# Patient Record
Sex: Male | Born: 1960 | Race: Black or African American | Hispanic: No | Marital: Married | Smoking: Former smoker
Health system: Southern US, Community
[De-identification: ages and names within clinical notes are randomized; demographics above are authoritative.]

## PROBLEM LIST (undated history)

## (undated) DIAGNOSIS — E119 Type 2 diabetes mellitus without complications: Secondary | ICD-10-CM

## (undated) DIAGNOSIS — I10 Essential (primary) hypertension: Secondary | ICD-10-CM

## (undated) DIAGNOSIS — T7840XA Allergy, unspecified, initial encounter: Secondary | ICD-10-CM

## (undated) DIAGNOSIS — E785 Hyperlipidemia, unspecified: Secondary | ICD-10-CM

## (undated) DIAGNOSIS — M1990 Unspecified osteoarthritis, unspecified site: Secondary | ICD-10-CM

## (undated) DIAGNOSIS — G4730 Sleep apnea, unspecified: Secondary | ICD-10-CM

## (undated) HISTORY — DX: Type 2 diabetes mellitus without complications: E11.9

## (undated) HISTORY — DX: Hyperlipidemia, unspecified: E78.5

## (undated) HISTORY — DX: Essential (primary) hypertension: I10

## (undated) HISTORY — DX: Unspecified osteoarthritis, unspecified site: M19.90

## (undated) HISTORY — DX: Allergy, unspecified, initial encounter: T78.40XA

## (undated) HISTORY — PX: NO PAST SURGERIES: SHX2092

## (undated) HISTORY — DX: Sleep apnea, unspecified: G47.30

---

## 1998-05-03 ENCOUNTER — Encounter: Admission: RE | Admit: 1998-05-03 | Discharge: 1998-08-01 | Payer: Self-pay | Admitting: *Deleted

## 2007-06-26 ENCOUNTER — Encounter: Admission: RE | Admit: 2007-06-26 | Discharge: 2007-06-26 | Payer: Self-pay | Admitting: Family Medicine

## 2012-02-20 ENCOUNTER — Encounter (HOSPITAL_BASED_OUTPATIENT_CLINIC_OR_DEPARTMENT_OTHER): Payer: Self-pay

## 2013-02-07 ENCOUNTER — Other Ambulatory Visit: Payer: Self-pay | Admitting: Family Medicine

## 2013-02-07 ENCOUNTER — Encounter: Payer: Self-pay | Admitting: Internal Medicine

## 2013-02-07 DIAGNOSIS — E785 Hyperlipidemia, unspecified: Secondary | ICD-10-CM

## 2013-02-11 ENCOUNTER — Other Ambulatory Visit: Payer: Self-pay

## 2013-02-14 ENCOUNTER — Other Ambulatory Visit: Payer: Self-pay

## 2013-02-20 ENCOUNTER — Other Ambulatory Visit: Payer: Self-pay

## 2013-03-20 ENCOUNTER — Encounter: Payer: Self-pay | Admitting: Internal Medicine

## 2013-04-22 ENCOUNTER — Ambulatory Visit: Payer: Self-pay | Admitting: Family Medicine

## 2013-04-23 ENCOUNTER — Ambulatory Visit (INDEPENDENT_AMBULATORY_CARE_PROVIDER_SITE_OTHER): Payer: BC Managed Care – PPO | Admitting: Physician Assistant

## 2013-04-23 ENCOUNTER — Encounter: Payer: Self-pay | Admitting: Physician Assistant

## 2013-04-23 VITALS — BP 158/93 | HR 76 | Temp 98.2°F | Ht 71.0 in

## 2013-04-23 DIAGNOSIS — E1169 Type 2 diabetes mellitus with other specified complication: Secondary | ICD-10-CM | POA: Insufficient documentation

## 2013-04-23 DIAGNOSIS — I152 Hypertension secondary to endocrine disorders: Secondary | ICD-10-CM | POA: Insufficient documentation

## 2013-04-23 DIAGNOSIS — E139 Other specified diabetes mellitus without complications: Secondary | ICD-10-CM

## 2013-04-23 DIAGNOSIS — I10 Essential (primary) hypertension: Secondary | ICD-10-CM | POA: Insufficient documentation

## 2013-04-23 DIAGNOSIS — E669 Obesity, unspecified: Secondary | ICD-10-CM

## 2013-04-23 DIAGNOSIS — E119 Type 2 diabetes mellitus without complications: Secondary | ICD-10-CM

## 2013-04-23 DIAGNOSIS — E785 Hyperlipidemia, unspecified: Secondary | ICD-10-CM

## 2013-04-23 DIAGNOSIS — E1159 Type 2 diabetes mellitus with other circulatory complications: Secondary | ICD-10-CM | POA: Insufficient documentation

## 2013-04-23 DIAGNOSIS — E782 Mixed hyperlipidemia: Secondary | ICD-10-CM | POA: Insufficient documentation

## 2013-04-23 MED ORDER — AMLODIPINE BESY-BENAZEPRIL HCL 10-40 MG PO CAPS: 1.0000 | ORAL_CAPSULE | Freq: Every day | ORAL | Status: DC

## 2013-04-23 NOTE — Progress Notes (Signed)
Subjective:     Patient ID: Jason Meyer, male   DOB: [birth year], 52 y.o.   MRN: [id number]  Cough This is a new problem. The current episode started in the past 7 days. The problem has been unchanged. The problem occurs constantly. The cough is non-productive. Associated symptoms include heartburn and postnasal drip. The symptoms are aggravated by lying down. He has tried nothing for the symptoms. His past medical history is significant for environmental allergies.     Review of Systems  HENT: Positive for postnasal drip.   Respiratory: Positive for cough.   Gastrointestinal: Positive for heartburn.  Allergic/Immunologic: Positive for environmental allergies.  All other systems reviewed and are negative.       Objective:   Physical Exam  HENT:  Mouth/Throat: Uvula is midline, oropharynx is clear and moist and mucous membranes are normal.  Neck: No JVD present. Carotid bruit is not present.  Cardiovascular: Normal rate, regular rhythm, S1 normal, S2 normal, normal heart sounds and normal pulses.   Pulmonary/Chest: Effort normal and breath sounds normal.       Assessment:     Cough- question allergic rhinitis     Plan:     OTC antihistamines Fluids Rest INB- nasal steroid

## 2013-04-23 NOTE — Patient Instructions (Addendum)
Cough, Adult  A cough is a reflex that helps clear your throat and airways. It can help heal the body or may be a reaction to an irritated airway. A cough may only last 2 or 3 weeks (acute) or may last more than 8 weeks (chronic).  CAUSES Acute cough:  Viral or bacterial infections. Chronic cough:  Infections.  Allergies.  Asthma.  Post-nasal drip.  Smoking.  Heartburn or acid reflux.  Some medicines.  Chronic lung problems (COPD).  Cancer. SYMPTOMS   Cough.  Fever.  Chest pain.  Increased breathing rate.  High-pitched whistling sound when breathing (wheezing).  Colored mucus that you cough up (sputum). TREATMENT   A bacterial cough may be treated with antibiotic medicine.  A viral cough must run its course and will not respond to antibiotics.  Your caregiver may recommend other treatments if you have a chronic cough. HOME CARE INSTRUCTIONS   Only take over-the-counter or prescription medicines for pain, discomfort, or fever as directed by your caregiver. Use cough suppressants only as directed by your caregiver.  Use a cold steam vaporizer or humidifier in your bedroom or home to help loosen secretions.  Sleep in a semi-upright position if your cough is worse at night.  Rest as needed.  Stop smoking if you smoke. SEEK IMMEDIATE MEDICAL CARE IF:   You have pus in your sputum.  Your cough starts to worsen.  You cannot control your cough with suppressants and are losing sleep.  You begin coughing up blood.  You have difficulty breathing.  You develop pain which is getting worse or is uncontrolled with medicine.  You have a fever. MAKE SURE YOU:   Understand these instructions.  Will watch your condition.  Will get help right away if you are not doing well or get worse. Document Released: 05/26/2011 Document Revised: 02/19/2012 Document Reviewed: 05/26/2011 [facility] Patient Information 2013 [place], [place].   Use Claritin (loratadine)  10mg  OTC.

## 2013-04-29 ENCOUNTER — Ambulatory Visit: Payer: BC Managed Care – PPO | Admitting: General Practice

## 2013-08-05 ENCOUNTER — Ambulatory Visit (INDEPENDENT_AMBULATORY_CARE_PROVIDER_SITE_OTHER): Payer: BC Managed Care – PPO | Admitting: Family Medicine

## 2013-08-05 ENCOUNTER — Encounter: Payer: Self-pay | Admitting: Family Medicine

## 2013-08-05 VITALS — BP 148/79 | HR 72 | Temp 98.0°F | Ht 71.0 in

## 2013-08-05 DIAGNOSIS — S39011A Strain of muscle, fascia and tendon of abdomen, initial encounter: Secondary | ICD-10-CM

## 2013-08-05 DIAGNOSIS — IMO0002 Reserved for concepts with insufficient information to code with codable children: Secondary | ICD-10-CM

## 2013-08-05 DIAGNOSIS — E119 Type 2 diabetes mellitus without complications: Secondary | ICD-10-CM

## 2013-08-05 MED ORDER — CYCLOBENZAPRINE HCL 10 MG PO TABS: 10.0000 mg | ORAL_TABLET | Freq: Three times a day (TID) | ORAL | Status: DC | PRN

## 2013-08-05 MED ORDER — MELOXICAM 15 MG PO TABS: 15.0000 mg | ORAL_TABLET | Freq: Every day | ORAL | Status: DC

## 2013-08-05 NOTE — Addendum Note (Signed)
Addended by: Floydene Flock on: 08/05/2013 10:28 AM   Modules accepted: Orders

## 2013-08-05 NOTE — Progress Notes (Addendum)
  Subjective:    Patient ID: Jason Meyer, male    DOB: May 13, 1961, 52 y.o.   MRN: [id number]  HPI Patient presents today with chief complaint of R lateral abdominal pain  Initially noticed pain 4-5 days ago.  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  Pain worse with movement.  No dysuria, diarrhea, nausea No rash.  No burning.  Has been taking IBF with mild improvement in sxs.  Baseline morbid obesity, DM, HTN.  Pt states DM well controlled, but unsure of blood sugars.     Review of Systems  All other systems reviewed and are negative.       Objective:   Physical Exam  Constitutional:  Morbidly obese    HENT:  Head: Normocephalic and atraumatic.  Eyes: Conjunctivae are normal. Pupils are equal, round, and reactive to light.  Neck: Normal range of motion.  Cardiovascular: Normal rate and regular rhythm.   Pulmonary/Chest: Effort normal and breath sounds normal.  Abdominal:    Obese abdomen, + bowel sounds No flank pain  + mild TTP along affected area + pain over affected area with lateral abdominal movement.     Musculoskeletal: Normal range of motion.  Neurological: He is alert.  Skin: Skin is warm.          Assessment & Plan:  Abdominal muscle strain, initial encounter - Plan: meloxicam (MOBIC) 15 MG tablet, cyclobenzaprine (FLEXERIL) 10 MG tablet  DM (diabetes mellitus) - Plan: POCT glycosylated hemoglobin (Hb A1C)  Likely lateral abdominal strain. Rice and NSAIDs. Flexeril.  Discuss imaging in other forms a workup that may be necessary if symptoms fail to improve. Patient currently in a rush and would like to leave for a dentist appointment. Discussed with patient if symptoms persist we will need to do other studies like a KUB or urinalysis to rule out other causes of sxs like a kidney stone. Patient expressed understanding of this. We'll also check A1c to assess for diabetic status.  Also discussed making  dentist aware of patient being on a muscle relaxer to the dentist indicates that he is prescribed narcotics by the dentist to avoid oversedation. Patient expressed understanding.

## 2013-10-21 ENCOUNTER — Encounter: Payer: Self-pay | Admitting: Family Medicine

## 2013-10-21 ENCOUNTER — Ambulatory Visit (INDEPENDENT_AMBULATORY_CARE_PROVIDER_SITE_OTHER): Payer: BC Managed Care – PPO | Admitting: Family Medicine

## 2013-10-21 ENCOUNTER — Encounter (INDEPENDENT_AMBULATORY_CARE_PROVIDER_SITE_OTHER): Payer: Self-pay

## 2013-10-21 VITALS — BP 175/97 | HR 67 | Temp 98.2°F | Ht 70.0 in | Wt >= 6400 oz

## 2013-10-21 DIAGNOSIS — I10 Essential (primary) hypertension: Secondary | ICD-10-CM

## 2013-10-21 DIAGNOSIS — E119 Type 2 diabetes mellitus without complications: Secondary | ICD-10-CM

## 2013-10-21 DIAGNOSIS — E785 Hyperlipidemia, unspecified: Secondary | ICD-10-CM

## 2013-10-21 DIAGNOSIS — N529 Male erectile dysfunction, unspecified: Secondary | ICD-10-CM

## 2013-10-21 LAB — POCT CBC
Granulocyte percent: 70 %G (ref 37–80)
HCT, POC: 48.5 % (ref 43.5–53.7)
Hemoglobin: 15.2 g/dL (ref 14.1–18.1)
Lymph, poc: 2.2 (ref 0.6–3.4)
MCH, POC: 25.7 pg — AB (ref 27–31.2)
MCHC: 31.4 g/dL — AB (ref 31.8–35.4)
MCV: 82 fL (ref 80–97)
MPV: 8.2 fL (ref 0–99.8)
POC Granulocyte: 6.6 (ref 2–6.9)
POC LYMPH PERCENT: 23.8 %L (ref 10–50)
Platelet Count, POC: 252 10*3/uL (ref 142–424)
RBC: 5.9 M/uL (ref 4.69–6.13)
RDW, POC: 14.2 %
WBC: 9.4 10*3/uL (ref 4.6–10.2)

## 2013-10-21 LAB — POCT GLYCOSYLATED HEMOGLOBIN (HGB A1C): Hemoglobin A1C: 6.7

## 2013-10-21 MED ORDER — SILDENAFIL CITRATE 100 MG PO TABS: 50.0000 mg | ORAL_TABLET | Freq: Every day | ORAL | Status: DC | PRN

## 2013-10-21 MED ORDER — ATORVASTATIN CALCIUM 40 MG PO TABS: 40.0000 mg | ORAL_TABLET | Freq: Every day | ORAL | Status: DC

## 2013-10-21 MED ORDER — HYDROCHLOROTHIAZIDE 25 MG PO TABS: 25.0000 mg | ORAL_TABLET | Freq: Every day | ORAL | Status: DC

## 2013-10-21 MED ORDER — METOPROLOL TARTRATE 100 MG PO TABS: 100.0000 mg | ORAL_TABLET | Freq: Two times a day (BID) | ORAL | Status: DC

## 2013-10-21 MED ORDER — METFORMIN HCL 500 MG PO TABS: 500.0000 mg | ORAL_TABLET | Freq: Two times a day (BID) | ORAL | Status: DC

## 2013-10-21 MED ORDER — AMLODIPINE BESY-BENAZEPRIL HCL 10-40 MG PO CAPS: 1.0000 | ORAL_CAPSULE | Freq: Every day | ORAL | Status: DC

## 2013-10-21 NOTE — Progress Notes (Signed)
  Subjective:    Patient ID: Jason Meyer, male    DOB: [birth year], 52 y.o.   MRN: [id number]  HPI Follow up htn,diabetes and needs refills. Been out of meds since  Friday.            Patient Active Problem List   Diagnosis Date Noted  . 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 HTN (hypertension) 04/23/2013  . Obesity, unspecified 04/23/2013  . Other and unspecified hyperlipidemia 04/23/2013  Current outpatient prescriptions:amLODipine-benazepril (LOTREL) 10-40 MG per capsule, Take 1 capsule by mouth daily., Disp: 60 capsule, Rfl: 1;  atorvastatin (LIPITOR) 40 MG tablet, Take 40 mg by mouth daily., Disp: , Rfl: ;  cimetidine (TAGAMET) 200 MG tablet, Take 200 mg by mouth 4 (four) times daily., Disp: , Rfl:  cyclobenzaprine (FLEXERIL) 10 MG tablet, Take 1 tablet (10 mg total) by mouth 3 (three) times daily as needed for muscle spasms., Disp: 30 tablet, Rfl: 0;  hydrochlorothiazide (HYDRODIURIL) 25 MG tablet, Take 25 mg by mouth daily., Disp: , Rfl: ;  HYDROcodone-acetaminophen (NORCO) 7.5-325 MG per tablet, , Disp: , Rfl: ;  meloxicam (MOBIC) 15 MG tablet, Take 1 tablet (15 mg total) by mouth daily., Disp: 30 tablet, Rfl: 1 metFORMIN (GLUCOPHAGE) 500 MG tablet, Take 500 mg by mouth 2 (two) times daily with a meal., Disp: , Rfl: ;  metoprolol (LOPRESSOR) 100 MG tablet, Take 100 mg by mouth 2 (two) times daily., Disp: , Rfl:     Review of Systems BP 175/97  Pulse 67  Temp(Src) 98.2 F (36.8 C) (Oral)  Ht 5\' 10"  (1.778 m)  Wt 439 lb (199.129 kg)  BMI 62.99 kg/m2     Objective:   Physical Exam        Assessment & Plan:

## 2013-10-21 NOTE — Progress Notes (Signed)
  Subjective:    Patient ID: Jason Meyer, male    DOB: 01/15/1961, 52 y.o.   MRN: [id number]  HPI This 52 y.o. male presents for evaluation of diabetes, hypertension, and hyperlipidemia Follow up.  He is experiencing some ED sx's.   Review of Systems C/o ED No chest pain, SOB, HA, dizziness, vision change, N/V, diarrhea, constipation, dysuria, urinary urgency or frequency, myalgias, arthralgias or rash.     Objective:   Physical Exam Vital signs noted  Well developed well nourished male.  HEENT - Head atraumatic Normocephalic                Eyes - PERRLA, Conjuctiva - clear Sclera- Clear EOMI                Ears - EAC's Wnl TM's Wnl Gross Hearing WNL                Nose - Nares patent                 Throat - oropharanx wnl Respiratory - Lungs CTA bilateral Cardiac - RRR S1 and S2 without murmur GI - Abdomen soft Nontender and bowel sounds active x 4 Extremities - No edema. Neuro - Grossly intact.       Assessment & Plan:  Diabetes - Plan: metFORMIN (GLUCOPHAGE) 500 MG tablet, POCT CBC, CMP14+EGFR, POCT glycosylated hemoglobin (Hb A1C)  Essential hypertension, benign - Plan: amLODipine-benazepril (LOTREL) 10-40 MG per capsule, hydrochlorothiazide (HYDRODIURIL) 25 MG tablet, metoprolol (LOPRESSOR) 100 MG tablet, POCT CBC, CMP14+EGFR, POCT glycosylated hemoglobin (Hb A1C)  Hyperlipidemia - Plan: atorvastatin (LIPITOR) 40 MG tablet  Erectile dysfunction - Plan: sildenafil (VIAGRA) 100 MG tablet  Deatra Canter FNP

## 2013-10-21 NOTE — Patient Instructions (Signed)

## 2013-10-22 LAB — CMP14+EGFR
ALT: 36 IU/L (ref 0–44)
AST: 22 IU/L (ref 0–40)
Albumin/Globulin Ratio: 1.4 (ref 1.1–2.5)
Albumin: 3.9 g/dL (ref 3.5–5.5)
Alkaline Phosphatase: 102 IU/L (ref 39–117)
BUN/Creatinine Ratio: 12 (ref 9–20)
BUN: 12 mg/dL (ref 6–24)
CO2: 27 mmol/L (ref 18–29)
Calcium: 9.5 mg/dL (ref 8.7–10.2)
Chloride: 98 mmol/L (ref 97–108)
Creatinine, Ser: 1.02 mg/dL (ref 0.76–1.27)
GFR calc Af Amer: 99 mL/min/{1.73_m2} (ref >59)
GFR calc non Af Amer: 85 mL/min/{1.73_m2} (ref >59)
Globulin, Total: 2.8 g/dL (ref 1.5–4.5)
Glucose: 152 mg/dL — ABNORMAL HIGH (ref 65–99)
Potassium: 4.9 mmol/L (ref 3.5–5.2)
Sodium: 142 mmol/L (ref 134–144)
Total Bilirubin: 0.3 mg/dL (ref 0.0–1.2)
Total Protein: 6.7 g/dL (ref 6.0–8.5)

## 2014-01-12 ENCOUNTER — Encounter: Payer: Self-pay | Admitting: Family Medicine

## 2014-01-12 ENCOUNTER — Ambulatory Visit (INDEPENDENT_AMBULATORY_CARE_PROVIDER_SITE_OTHER): Payer: BC Managed Care – PPO | Admitting: Family Medicine

## 2014-01-12 VITALS — BP 162/85 | HR 69 | Temp 97.8°F | Ht 70.0 in | Wt >= 6400 oz

## 2014-01-12 DIAGNOSIS — E669 Obesity, unspecified: Secondary | ICD-10-CM

## 2014-01-12 DIAGNOSIS — E119 Type 2 diabetes mellitus without complications: Secondary | ICD-10-CM

## 2014-01-12 DIAGNOSIS — I10 Essential (primary) hypertension: Secondary | ICD-10-CM

## 2014-01-12 DIAGNOSIS — K5289 Other specified noninfective gastroenteritis and colitis: Secondary | ICD-10-CM

## 2014-01-12 DIAGNOSIS — E785 Hyperlipidemia, unspecified: Secondary | ICD-10-CM

## 2014-01-12 DIAGNOSIS — E139 Other specified diabetes mellitus without complications: Secondary | ICD-10-CM

## 2014-01-12 DIAGNOSIS — K529 Noninfective gastroenteritis and colitis, unspecified: Secondary | ICD-10-CM

## 2014-01-12 NOTE — Patient Instructions (Signed)
    Dr Daquane Aguilar's Recommendations  For nutrition information, I recommend books:  1).Eat to Live by Dr Joel Fuhrman. 2).Prevent and Reverse Heart Disease by Dr Caldwell Esselstyn. 3) Dr Neal Barnard's Book:  Program to Reverse Diabetes  Exercise recommendations are:  If unable to walk, then the patient can exercise in a chair 3 times a day. By flapping arms like a bird gently and raising legs outwards to the front.  If ambulatory, the patient can go for walks for 30 minutes 3 times a week. Then increase the intensity and duration as tolerated.  Goal is to try to attain exercise frequency to 5 times a week.  If applicable: Best to perform resistance exercises (machines or weights) 2 days a week and cardio type exercises 3 days per week.  DASH Diet The DASH diet stands for "Dietary Approaches to Stop Hypertension." It is a healthy eating plan that has been shown to reduce high blood pressure (hypertension) in as little as 14 days, while also possibly providing other significant health benefits. These other health benefits include reducing the risk of breast cancer after menopause and reducing the risk of type 2 diabetes, heart disease, colon cancer, and stroke. Health benefits also include weight loss and slowing kidney failure in patients with chronic kidney disease.  DIET GUIDELINES  Limit salt (sodium). Your diet should contain less than 1500 mg of sodium daily.  Limit refined or processed carbohydrates. Your diet should include mostly whole grains. Desserts and added sugars should be used sparingly.  Include small amounts of heart-healthy fats. These types of fats include nuts, oils, and tub margarine. Limit saturated and trans fats. These fats have been shown to be harmful in the body. CHOOSING FOODS  The following food groups are based on a 2000 calorie diet. See your Registered Dietitian for individual calorie needs. Grains and Grain Products (6 to 8 servings daily)  Eat More  Often: Whole-wheat bread, brown rice, whole-grain or wheat pasta, quinoa, popcorn without added fat or salt (air popped).  Eat Less Often: White bread, white pasta, white rice, cornbread. Vegetables (4 to 5 servings daily)  Eat More Often: Fresh, frozen, and canned vegetables. Vegetables may be raw, steamed, roasted, or grilled with a minimal amount of fat.  Eat Less Often/Avoid: Creamed or fried vegetables. Vegetables in a cheese sauce. Fruit (4 to 5 servings daily)  Eat More Often: All fresh, canned (in natural juice), or frozen fruits. Dried fruits without added sugar. One hundred percent fruit juice ( cup [237 mL] daily).  Eat Less Often: Dried fruits with added sugar. Canned fruit in light or heavy syrup. Lean Meats, Fish, and Poultry (2 servings or less daily. One serving is 3 to 4 oz [85-114 g]).  Eat More Often: Ninety percent or leaner ground beef, tenderloin, sirloin. Round cuts of beef, chicken breast, turkey breast. All fish. Grill, bake, or broil your meat. Nothing should be fried.  Eat Less Often/Avoid: Fatty cuts of meat, turkey, or chicken leg, thigh, or wing. Fried cuts of meat or fish. Dairy (2 to 3 servings)  Eat More Often: Low-fat or fat-free milk, low-fat plain or light yogurt, reduced-fat or part-skim cheese.  Eat Less Often/Avoid: Milk (whole, 2%).Whole milk yogurt. Full-fat cheeses. Nuts, Seeds, and Legumes (4 to 5 servings per week)  Eat More Often: All without added salt.  Eat Less Often/Avoid: Salted nuts and seeds, canned beans with added salt. Fats and Sweets (limited)  Eat More Often: Vegetable oils, tub margarines   without trans fats, sugar-free gelatin. Mayonnaise and salad dressings.  Eat Less Often/Avoid: Coconut oils, palm oils, butter, stick margarine, cream, half and half, cookies, candy, pie. FOR MORE INFORMATION The Dash Diet Eating Plan: www.dashdiet.org Document Released: 11/16/2011 Document Revised: 02/19/2012 Document Reviewed:  11/16/2011 [employer] Patient Information 2014 [employer].  

## 2014-01-13 NOTE — Progress Notes (Signed)
Patient ID: Jason Meyer, male   DOB: November 13, 1961, 53 y.o.   MRN: [id number] SUBJECTIVE: CC: Chief Complaint  Patient presents with  . Acute Visit    diarrhea last thurday and friday got better on sunday. was out of work last thurd friday and today needs work note    HPI: As above better today. Needs a note to go back to work. Has other medical problems associated with Obesity. Eats whatever he gets his hands on while he works on the [employer] for the [employer]> Patient is here for follow up of Diabetes Mellitus: Symptoms evaluated: Denies Nocturia ,Denies Urinary Frequency , denies Blurred vision ,deniesDizziness,denies.Dysuria,denies paresthesias, denies extremity pain or ulcers.Marland Kitchendenies chest pain. has had an annual eye exam. do check the feet. Does check CBGs. Average CBG: Denies episodes of hypoglycemia. Does have an emergency hypoglycemic plan. admits toCompliance with medications. Denies Problems with medications.   Past Medical History  Diagnosis Date  . Allergy   . Diabetes mellitus without complication   . Hypertension   . Hyperlipidemia    No past surgical history on file. History   Social History  . Marital Status: Married    Spouse Name: N/A    Number of Children: N/A  . Years of Education: N/A   Occupational History  . Not on file.   Social History Main Topics  . Smoking status: Never Smoker   . Smokeless tobacco: Not on file  . Alcohol Use: No  . Drug Use: No  . Sexual Activity: Not on file   Other Topics Concern  . Not on file   Social History Narrative  . No narrative on file   Family History  Problem Relation Age of Onset  . Heart attack Brother   . Alzheimer's disease Mother   . Heart attack Father   . Stroke Father    Current Outpatient Prescriptions on File Prior to Visit  Medication Sig Dispense Refill  . amLODipine-benazepril (LOTREL) 10-40 MG per capsule Take 1 capsule by mouth daily.  60 capsule  5  . atorvastatin (LIPITOR) 40 MG tablet  Take 1 tablet (40 mg total) by mouth daily.  30 tablet  11  . hydrochlorothiazide (HYDRODIURIL) 25 MG tablet Take 1 tablet (25 mg total) by mouth daily.  30 tablet  5  . metFORMIN (GLUCOPHAGE) 500 MG tablet Take 1 tablet (500 mg total) by mouth 2 (two) times daily with a meal.  60 tablet  5  . metoprolol (LOPRESSOR) 100 MG tablet Take 1 tablet (100 mg total) by mouth 2 (two) times daily.  60 tablet  5  . cimetidine (TAGAMET) 200 MG tablet Take 200 mg by mouth 4 (four) times daily.      . cyclobenzaprine (FLEXERIL) 10 MG tablet Take 1 tablet (10 mg total) by mouth 3 (three) times daily as needed for muscle spasms.  30 tablet  0  . HYDROcodone-acetaminophen (NORCO) 7.5-325 MG per tablet       . meloxicam (MOBIC) 15 MG tablet Take 1 tablet (15 mg total) by mouth daily.  30 tablet  1  . sildenafil (VIAGRA) 100 MG tablet Take 0.5-1 tablets (50-100 mg total) by mouth daily as needed for erectile dysfunction.  5 tablet  11   No current facility-administered medications on file prior to visit.   Allergies  Allergen Reactions  . Penicillins     There is no immunization history on file for this patient. Prior to Admission medications   Medication Sig Start Date End Date Taking?  Authorizing Provider  amLODipine-benazepril (LOTREL) 10-40 MG per capsule Take 1 capsule by mouth daily. 10/21/13  Yes Lysbeth Penner, FNP  atorvastatin (LIPITOR) 40 MG tablet Take 1 tablet (40 mg total) by mouth daily. 10/21/13  Yes Lysbeth Penner, FNP  hydrochlorothiazide (HYDRODIURIL) 25 MG tablet Take 1 tablet (25 mg total) by mouth daily. 10/21/13  Yes Lysbeth Penner, FNP  metFORMIN (GLUCOPHAGE) 500 MG tablet Take 1 tablet (500 mg total) by mouth 2 (two) times daily with a meal. 10/21/13  Yes Lysbeth Penner, FNP  metoprolol (LOPRESSOR) 100 MG tablet Take 1 tablet (100 mg total) by mouth 2 (two) times daily. 10/21/13  Yes Lysbeth Penner, FNP  cimetidine (TAGAMET) 200 MG tablet Take 200 mg by mouth 4 (four) times  daily.    Historical Provider, MD  cyclobenzaprine (FLEXERIL) 10 MG tablet Take 1 tablet (10 mg total) by mouth 3 (three) times daily as needed for muscle spasms. 08/05/13   Shanda Howells, MD  HYDROcodone-acetaminophen [facility]) 7.5-325 MG per tablet  07/21/13   Historical Provider, MD  meloxicam (MOBIC) 15 MG tablet Take 1 tablet (15 mg total) by mouth daily. 08/05/13   Shanda Howells, MD  sildenafil (VIAGRA) 100 MG tablet Take 0.5-1 tablets (50-100 mg total) by mouth daily as needed for erectile dysfunction. 10/21/13   Lysbeth Penner, FNP     ROS: As above in the HPI. All other systems are stable or negative.  OBJECTIVE: APPEARANCE:  Patient in no acute distress.The patient appeared well nourished and normally developed. Acyanotic. Waist: VITAL SIGNS:BP 162/85  Pulse 69  Temp(Src) 97.8 F (36.6 C) (Oral)  Ht 5' 10"  (1.778 m)  Wt 433 lb 12.8 oz (196.77 kg)  BMI 62.24 kg/m2  Morbidly obese AAM  SKIN: warm and  Dry without overt rashes, tattoos and scars  HEAD and Neck: without JVD, Head and scalp: normal Eyes:No scleral icterus. Fundi normal, eye movements normal. Ears: Auricle normal, canal normal, Tympanic membranes normal, insufflation normal. Nose: normal Throat: normal Neck & thyroid: normal  CHEST & LUNGS: Chest wall: normal Lungs: Clear  CVS: Reveals the PMI to be normally located. Regular rhythm, First and Second Heart sounds are normal,  absence of murmurs, rubs or gallops. Peripheral vasculature: Radial pulses: normal Dorsal pedis pulses: normal Posterior pulses: normal  ABDOMEN:  Appearance: Obese Benign, no organomegaly, no masses, no Abdominal Aortic enlargement. No Guarding , no rebound. No Bruits. Bowel sounds: normal  RECTAL: N/A GU: N/A  EXTREMETIES: nonedematous.  MUSCULOSKELETAL:  Spine: normal Joints: intact  NEUROLOGIC: oriented to time,place and person; nonfocal. Strength is normal Sensory is normal Reflexes are normal Cranial  Nerves are normal.  Results for orders placed in visit on 10/21/13  CMP14+EGFR      Result Value Range   Glucose 152 (*) 65 - 99 mg/dL   BUN 12  6 - 24 mg/dL   Creatinine, Ser 1.02  0.76 - 1.27 mg/dL   GFR calc non Af Amer 85  >59 mL/min/1.73   GFR calc Af Amer 99  >59 mL/min/1.73   BUN/Creatinine Ratio 12  9 - 20   Sodium 142  134 - 144 mmol/L   Potassium 4.9  3.5 - 5.2 mmol/L   Chloride 98  97 - 108 mmol/L   CO2 27  18 - 29 mmol/L   Calcium 9.5  8.7 - 10.2 mg/dL   Total Protein 6.7  6.0 - 8.5 g/dL   Albumin 3.9  3.5 - 5.5 g/dL  Globulin, Total 2.8  1.5 - 4.5 g/dL   Albumin/Globulin Ratio 1.4  1.1 - 2.5   Total Bilirubin 0.3  0.0 - 1.2 mg/dL   Alkaline Phosphatase 102  39 - 117 IU/L   AST 22  0 - 40 IU/L   ALT 36  0 - 44 IU/L  POCT CBC      Result Value Range   WBC 9.4  4.6 - 10.2 K/uL   Lymph, poc 2.2  0.6 - 3.4   POC LYMPH PERCENT 23.8  10 - 50 %L   POC Granulocyte 6.6  2 - 6.9   Granulocyte percent 70.0  37 - 80 %G   RBC 5.9  4.69 - 6.13 M/uL   Hemoglobin 15.2  14.1 - 18.1 g/dL   HCT, POC 48.5  43.5 - 53.7 %   MCV 82.0  80 - 97 fL   MCH, POC 25.7 (*) 27 - 31.2 pg   MCHC 31.4 (*) 31.8 - 35.4 g/dL   RDW, POC 14.2     Platelet Count, POC 252.0  142 - 424 K/uL   MPV 8.2  0 - 99.8 fL  POCT GLYCOSYLATED HEMOGLOBIN (HGB A1C)      Result Value Range   Hemoglobin A1C 6.7 %      ASSESSMENT: Gastroenteritis  Other and unspecified hyperlipidemia  Obesity, unspecified  HTN (hypertension)  Diabetes 1.5, managed as type 2 VGE resolved   PLAN:      Dr Paula Libra Recommendations  For nutrition information, I recommend books:  1).Eat to Live by Dr Excell Seltzer. 2).Prevent and Reverse Heart Disease by Dr Karl Luke. 3) Dr Janene Harvey Book:  Program to Reverse Diabetes  Exercise recommendations are:  If unable to walk, then the patient can exercise in a chair 3 times a day. By flapping arms like a bird gently and raising legs outwards to the  front.  If ambulatory, the patient can go for walks for 30 minutes 3 times a week. Then increase the intensity and duration as tolerated.  Goal is to try to attain exercise frequency to 5 times a week.  If applicable: Best to perform resistance exercises (machines or weights) 2 days a week and cardio type exercises 3 days per week.    DASH DIET.   Monitor BP and if >140/90 RTC prior to visit for BP medication adjustment.  Note to return to work.   No orders of the defined types were placed in this encounter.   No orders of the defined types were placed in this encounter.   There are no discontinued medications. Return in about 2 months (around 03/12/2014) for Recheck medical problems.  Ceceilia Cephus P. Jacelyn Grip, M.D.

## 2014-01-15 ENCOUNTER — Telehealth: Payer: Self-pay | Admitting: *Deleted

## 2014-01-15 ENCOUNTER — Other Ambulatory Visit: Payer: Self-pay | Admitting: Family Medicine

## 2014-01-15 DIAGNOSIS — I10 Essential (primary) hypertension: Secondary | ICD-10-CM

## 2014-01-15 DIAGNOSIS — E785 Hyperlipidemia, unspecified: Secondary | ICD-10-CM

## 2014-01-15 MED ORDER — AMLODIPINE BESY-BENAZEPRIL HCL 10-40 MG PO CAPS: 1.0000 | ORAL_CAPSULE | Freq: Every day | ORAL | Status: DC

## 2014-01-15 MED ORDER — ATORVASTATIN CALCIUM 40 MG PO TABS: 40.0000 mg | ORAL_TABLET | Freq: Every day | ORAL | Status: DC

## 2014-01-15 NOTE — Telephone Encounter (Signed)
Pt will lose insurance this weekend, because of changing jobs, his new ins. Will start in 2 months. Will you please order a 60 day supply of lotrel and lipitor, not refills

## 2014-01-15 NOTE — Telephone Encounter (Signed)
lipitor and lotrel sent to pharm

## 2014-01-15 NOTE — Telephone Encounter (Signed)
Aware. 

## 2014-01-20 ENCOUNTER — Encounter: Payer: Self-pay | Admitting: *Deleted

## 2014-02-17 ENCOUNTER — Ambulatory Visit: Payer: BC Managed Care – PPO | Admitting: Family Medicine

## 2014-02-18 ENCOUNTER — Ambulatory Visit: Payer: BC Managed Care – PPO | Admitting: Family Medicine

## 2014-02-20 ENCOUNTER — Telehealth: Payer: Self-pay | Admitting: Family Medicine

## 2014-02-23 ENCOUNTER — Telehealth: Payer: Self-pay | Admitting: *Deleted

## 2014-02-23 ENCOUNTER — Other Ambulatory Visit: Payer: Self-pay | Admitting: Family Medicine

## 2014-02-23 DIAGNOSIS — I10 Essential (primary) hypertension: Secondary | ICD-10-CM

## 2014-02-23 MED ORDER — AMLODIPINE BESYLATE 10 MG PO TABS: 10.0000 mg | ORAL_TABLET | Freq: Every day | ORAL | Status: DC

## 2014-02-23 MED ORDER — LISINOPRIL 20 MG PO TABS: 20.0000 mg | ORAL_TABLET | Freq: Every day | ORAL | Status: DC

## 2014-02-23 NOTE — Telephone Encounter (Signed)
Pt currently taking Lotrel for bp Does not have insurance Can you split the medication into 2 separate meds so pt can afford

## 2014-02-23 NOTE — Telephone Encounter (Signed)
No samples mail box full afc

## 2014-05-29 ENCOUNTER — Other Ambulatory Visit: Payer: Self-pay | Admitting: Family Medicine

## 2014-06-03 ENCOUNTER — Ambulatory Visit: Payer: BC Managed Care – PPO | Admitting: Family Medicine

## 2014-07-03 ENCOUNTER — Ambulatory Visit (INDEPENDENT_AMBULATORY_CARE_PROVIDER_SITE_OTHER): Payer: BC Managed Care – PPO | Admitting: Family Medicine

## 2014-07-03 VITALS — BP 216/85 | HR 63 | Temp 98.2°F | Ht 70.0 in | Wt >= 6400 oz

## 2014-07-03 DIAGNOSIS — E1351 Other specified diabetes mellitus with diabetic peripheral angiopathy without gangrene: Secondary | ICD-10-CM

## 2014-07-03 DIAGNOSIS — E0959 Drug or chemical induced diabetes mellitus with other circulatory complications: Secondary | ICD-10-CM

## 2014-07-03 DIAGNOSIS — M7918 Myalgia, other site: Secondary | ICD-10-CM

## 2014-07-03 DIAGNOSIS — I10 Essential (primary) hypertension: Secondary | ICD-10-CM

## 2014-07-03 DIAGNOSIS — R0789 Other chest pain: Secondary | ICD-10-CM

## 2014-07-03 DIAGNOSIS — E785 Hyperlipidemia, unspecified: Secondary | ICD-10-CM

## 2014-07-03 MED ORDER — CYCLOBENZAPRINE HCL 10 MG PO TABS: 10.0000 mg | ORAL_TABLET | Freq: Three times a day (TID) | ORAL | Status: DC | PRN

## 2014-07-03 MED ORDER — METFORMIN HCL 500 MG PO TABS: 500.0000 mg | ORAL_TABLET | Freq: Two times a day (BID) | ORAL | Status: DC

## 2014-07-03 MED ORDER — ATORVASTATIN CALCIUM 40 MG PO TABS: 40.0000 mg | ORAL_TABLET | Freq: Every day | ORAL | Status: DC

## 2014-07-03 MED ORDER — LISINOPRIL 40 MG PO TABS
40.0000 mg | ORAL_TABLET | Freq: Every day | ORAL | Status: DC
Start: 2014-07-03 — End: 2015-01-05

## 2014-07-03 MED ORDER — METOPROLOL TARTRATE 100 MG PO TABS: 100.0000 mg | ORAL_TABLET | Freq: Two times a day (BID) | ORAL | Status: DC

## 2014-07-03 MED ORDER — AMLODIPINE BESYLATE 10 MG PO TABS: 10.0000 mg | ORAL_TABLET | Freq: Every day | ORAL | Status: DC

## 2014-07-03 NOTE — Progress Notes (Signed)
   Subjective:    Patient ID: Jason Meyer, male    DOB: 1961/04/08, 53 y.o.   MRN: [id number]  HPI  This 53 y.o. male presents for evaluation of diabetes, right side pain, hypertension, and .hyperlipidemia.  He has been having elevated bp problems  Review of Systems C/o right side pain   No chest pain, SOB, HA, dizziness, vision change, N/V, diarrhea, constipation, dysuria, urinary urgency or frequency, myalgias, arthralgias or rash.  Objective:   Physical Exam Vital signs noted  Well developed well nourished obese male.  HEENT - Head atraumatic Normocephalic                Eyes - PERRLA, Conjuctiva - clear Sclera- Clear EOMI                Ears - EAC's Wnl TM's Wnl Gross Hearing WNL                Nose - Nares patent                 Throat - oropharanx wnl Respiratory - Lungs CTA bilateral Cardiac - RRR S1 and S2 without murmur.  BP 180/102 right arm GI - Abdomen soft Nontender and bowel sounds active x 4 Extremities - No edema. Neuro - Grossly intact.       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

## 2014-07-06 ENCOUNTER — Ambulatory Visit (INDEPENDENT_AMBULATORY_CARE_PROVIDER_SITE_OTHER): Payer: BC Managed Care – PPO | Admitting: Family Medicine

## 2014-07-06 ENCOUNTER — Encounter: Payer: Self-pay | Admitting: Family Medicine

## 2014-07-06 VITALS — BP 199/88 | HR 67 | Temp 98.1°F | Ht 70.0 in | Wt >= 6400 oz

## 2014-07-06 DIAGNOSIS — M129 Arthropathy, unspecified: Secondary | ICD-10-CM

## 2014-07-06 DIAGNOSIS — M1990 Unspecified osteoarthritis, unspecified site: Secondary | ICD-10-CM

## 2014-07-06 DIAGNOSIS — Z0000 Encounter for general adult medical examination without abnormal findings: Secondary | ICD-10-CM

## 2014-07-06 DIAGNOSIS — I10 Essential (primary) hypertension: Secondary | ICD-10-CM

## 2014-07-06 MED ORDER — HYDROCHLOROTHIAZIDE 25 MG PO TABS: 25.0000 mg | ORAL_TABLET | Freq: Every day | ORAL | Status: DC

## 2014-07-06 MED ORDER — MELOXICAM 15 MG PO TABS: 15.0000 mg | ORAL_TABLET | Freq: Every day | ORAL | Status: DC

## 2014-07-06 NOTE — Progress Notes (Signed)
   Subjective:    Patient ID: Haze RushingMichael Brueggemann, male    DOB: [birth year], 53 y.o.   MRN: [id number]  HPI This 53 y.o. male presents for evaluation of follow up on htn. He is due for labs.  He has hx of HTN, Obesity, and DM.  He has arthritis in his knees.   Review of Systems C/o knee arthralgias   No chest pain, SOB, HA, dizziness, vision change, N/V, diarrhea, constipation, dysuria, urinary urgency or frequency or rash.  Objective:   Physical Exam Vital signs noted  Well developed well nourished male.  HEENT - Head atraumatic Normocephalic                Eyes - PERRLA, Conjuctiva - clear Sclera- Clear EOMI                Ears - EAC's Wnl TM's Wnl Gross Hearing WNL                Nose - Nares patent                 Throat - oropharanx wnl Respiratory - Lungs CTA bilateral Cardiac - RRR S1 and S2 without murmur GI - Abdomen soft Nontender and bowel sounds active x 4 Extremities - No edema. Neuro - Grossly intact.       Assessment & Plan:  Routine general medical examination at a health care facility - Plan: TSH, PSA, total and free  Essential hypertension, benign - Plan: hydrochlorothiazide (HYDRODIURIL) 25 MG tablet  Arthritis - Plan: meloxicam (MOBIC) 15 MG tablet  Deatra CanterWilliam J Latresa Gasser FNP

## 2014-07-07 LAB — PSA, TOTAL AND FREE
PSA, Free Pct: 9.5 %
PSA, Free: 0.21 ng/mL
PSA: 2.2 ng/mL (ref 0.0–4.0)

## 2014-07-07 LAB — TSH: TSH: 1.75 u[IU]/mL (ref 0.450–4.500)

## 2014-07-23 ENCOUNTER — Encounter: Payer: Self-pay | Admitting: Family Medicine

## 2014-07-23 ENCOUNTER — Ambulatory Visit: Payer: BC Managed Care – PPO | Admitting: Family Medicine

## 2014-07-23 ENCOUNTER — Ambulatory Visit (INDEPENDENT_AMBULATORY_CARE_PROVIDER_SITE_OTHER): Payer: BC Managed Care – PPO | Admitting: Family Medicine

## 2014-07-23 VITALS — BP 153/79 | HR 72 | Temp 98.9°F | Ht 70.0 in | Wt >= 6400 oz

## 2014-07-23 DIAGNOSIS — I10 Essential (primary) hypertension: Secondary | ICD-10-CM

## 2014-07-23 NOTE — Progress Notes (Signed)
   Subjective:    Patient ID: Jason Meyer, male    DOB: 1961/06/17, 53 y.o.   MRN: [id number]  HPI This 53 y.o. male presents for evaluation of follow up on hypertension.  He is tolerating bp meds.   Review of Systems No chest pain, SOB, HA, dizziness, vision change, N/V, diarrhea, constipation, dysuria, urinary urgency or frequency, myalgias, arthralgias or rash.     Objective:   Physical Exam  Vital signs noted  Well developed well nourished male.  HEENT - Head atraumatic Normocephalic Respiratory - Lungs CTA bilateral Cardiac - RRR S1 and S2 without murmur.  BP 130/80 GI - Abdomen soft Nontender and bowel sounds active x 4 Extremities - No edema. Neuro - Grossly intact.      Assessment & Plan:  Essential hypertension  Continue current regimen  Follow up in 3 months  Deatra CanterWilliam J Oxford FNP

## 2014-09-03 ENCOUNTER — Encounter: Payer: Self-pay | Admitting: Family Medicine

## 2014-09-03 ENCOUNTER — Ambulatory Visit (INDEPENDENT_AMBULATORY_CARE_PROVIDER_SITE_OTHER): Payer: BC Managed Care – PPO | Admitting: Family Medicine

## 2014-09-03 ENCOUNTER — Telehealth: Payer: Self-pay | Admitting: Family Medicine

## 2014-09-03 VITALS — BP 171/89 | HR 76 | Temp 97.6°F | Wt >= 6400 oz

## 2014-09-03 DIAGNOSIS — J069 Acute upper respiratory infection, unspecified: Secondary | ICD-10-CM

## 2014-09-03 MED ORDER — BENZONATATE 100 MG PO CAPS: 100.0000 mg | ORAL_CAPSULE | Freq: Three times a day (TID) | ORAL | Status: DC | PRN

## 2014-09-03 MED ORDER — AZITHROMYCIN 250 MG PO TABS: ORAL_TABLET | ORAL | Status: DC

## 2014-09-03 NOTE — Progress Notes (Signed)
   Subjective:    Patient ID: Jason Meyer, male    DOB: May 18, 1961, 53 y.o.   MRN: [id number]  HPI  C/o uri sx's for last 3 days.  He is having nonproductive cough.  He has been taking a lot of otc cough and cold medicine.  Review of Systems    No chest pain, SOB, HA, dizziness, vision change, N/V, diarrhea, constipation, dysuria, urinary urgency or frequency, myalgias, arthralgias or rash.  Objective:   Physical Exam  Vital signs noted  Well developed well nourished obese male.  HEENT - Head atraumatic Normocephalic                Eyes - PERRLA, Conjuctiva - clear Sclera- Clear EOMI                Ears - EAC's Wnl TM's Wnl Gross Hearing WNL                Nose - Nares patent                 Throat - oropharanx wnl Respiratory - Lungs CTA bilateral Cardiac - RRR S1 and S2 without murmur GI - Abdomen soft Nontender and bowel sounds active x 4 Extremities - No edema. Neuro - Grossly intact.      Assessment & Plan:  URI (upper respiratory infection) - Plan: azithromycin (ZITHROMAX) 250 MG tablet, benzonatate (TESSALON PERLES) 100 MG capsule  Push po fluids, rest, tylenol and motrin otc prn as directed for fever, arthralgias, and myalgias.  Follow up prn if sx's continue or persist.  Deatra Canter FNP

## 2014-10-22 ENCOUNTER — Other Ambulatory Visit: Payer: Self-pay | Admitting: Family Medicine

## 2014-10-26 ENCOUNTER — Ambulatory Visit: Payer: BC Managed Care – PPO | Admitting: Family Medicine

## 2014-11-09 ENCOUNTER — Other Ambulatory Visit: Payer: Self-pay | Admitting: Family Medicine

## 2014-11-09 DIAGNOSIS — E0959 Drug or chemical induced diabetes mellitus with other circulatory complications: Secondary | ICD-10-CM

## 2014-11-09 MED ORDER — METFORMIN HCL 500 MG PO TABS: 500.0000 mg | ORAL_TABLET | Freq: Two times a day (BID) | ORAL | Status: DC

## 2014-11-09 NOTE — Telephone Encounter (Signed)
done

## 2015-01-05 ENCOUNTER — Ambulatory Visit (INDEPENDENT_AMBULATORY_CARE_PROVIDER_SITE_OTHER): Payer: BC Managed Care – PPO | Admitting: Family

## 2015-01-05 ENCOUNTER — Encounter: Payer: Self-pay | Admitting: Family

## 2015-01-05 VITALS — BP 158/75 | HR 79 | Temp 97.9°F | Ht 70.0 in | Wt >= 6400 oz

## 2015-01-05 DIAGNOSIS — E669 Obesity, unspecified: Secondary | ICD-10-CM

## 2015-01-05 DIAGNOSIS — E139 Other specified diabetes mellitus without complications: Secondary | ICD-10-CM

## 2015-01-05 DIAGNOSIS — E0959 Drug or chemical induced diabetes mellitus with other circulatory complications: Secondary | ICD-10-CM

## 2015-01-05 DIAGNOSIS — Z1321 Encounter for screening for nutritional disorder: Secondary | ICD-10-CM

## 2015-01-05 DIAGNOSIS — E785 Hyperlipidemia, unspecified: Secondary | ICD-10-CM

## 2015-01-05 DIAGNOSIS — I10 Essential (primary) hypertension: Secondary | ICD-10-CM

## 2015-01-05 MED ORDER — AMLODIPINE BESYLATE 10 MG PO TABS: 10.0000 mg | ORAL_TABLET | Freq: Every day | ORAL | Status: DC

## 2015-01-05 MED ORDER — METFORMIN HCL 500 MG PO TABS: 500.0000 mg | ORAL_TABLET | Freq: Two times a day (BID) | ORAL | Status: DC

## 2015-01-05 MED ORDER — METOPROLOL TARTRATE 100 MG PO TABS: 100.0000 mg | ORAL_TABLET | Freq: Two times a day (BID) | ORAL | Status: DC

## 2015-01-05 MED ORDER — ATORVASTATIN CALCIUM 40 MG PO TABS: 40.0000 mg | ORAL_TABLET | Freq: Every day | ORAL | Status: DC

## 2015-01-05 MED ORDER — LISINOPRIL 40 MG PO TABS: 40.0000 mg | ORAL_TABLET | Freq: Every day | ORAL | Status: DC

## 2015-01-05 MED ORDER — HYDROCHLOROTHIAZIDE 25 MG PO TABS: 25.0000 mg | ORAL_TABLET | Freq: Every day | ORAL | Status: DC

## 2015-01-05 NOTE — Patient Instructions (Signed)

## 2015-01-05 NOTE — Progress Notes (Signed)
Subjective:    Patient ID: Jason Meyer, male    DOB: Oct 02, 1961, 54 y.o.   MRN: [id number]  Diabetes He presents for his follow-up diabetic visit. He has type 2 diabetes mellitus. His disease course has been stable. Pertinent negatives for hypoglycemia include no confusion, dizziness or headaches. Pertinent negatives for diabetes include no blurred vision, no foot paresthesias, no foot ulcerations and no visual change. Pertinent negatives for hypoglycemia complications include no blackouts and no hospitalization. Symptoms are stable. Pertinent negatives for diabetic complications include no CVA, heart disease, nephropathy or peripheral neuropathy. Risk factors for coronary artery disease include diabetes mellitus, dyslipidemia, hypertension, male sex, obesity, family history and sedentary lifestyle. Current diabetic treatment includes oral agent (monotherapy). He is compliant with treatment all of the time. His weight is stable. He is following a generally unhealthy diet. His breakfast blood glucose range is generally 110-130 mg/dl. An ACE inhibitor/angiotensin II receptor blocker is being taken. Eye exam is not current.  Hypertension This is a chronic (Pt has not had his medicatin for the last 10 days) problem. The current episode started more than 1 year ago. The problem has been waxing and waning since onset. The problem is uncontrolled. Pertinent negatives include no anxiety, blurred vision, headaches, palpitations, peripheral edema or shortness of breath. Risk factors for coronary artery disease include diabetes mellitus, dyslipidemia, family history, male gender, obesity and sedentary lifestyle. Past treatments include ACE inhibitors, beta blockers, calcium channel blockers and diuretics. The current treatment provides moderate improvement. There is no history of kidney disease, CAD/MI, CVA, heart failure or a thyroid problem. There is no history of sleep apnea.  Hyperlipidemia This is a chronic  problem. The current episode started more than 1 year ago. The problem is controlled. Recent lipid tests were reviewed and are normal. Exacerbating diseases include diabetes and obesity. He has no history of hypothyroidism. Pertinent negatives include no shortness of breath. Current antihyperlipidemic treatment includes statins. The current treatment provides moderate improvement of lipids. Risk factors for coronary artery disease include diabetes mellitus, dyslipidemia, family history, hypertension, male sex, obesity and a sedentary lifestyle.      Review of Systems  Constitutional: Negative.   HENT: Negative.   Eyes: Negative for blurred vision.  Respiratory: Negative.  Negative for shortness of breath.   Cardiovascular: Negative.  Negative for palpitations.  Gastrointestinal: Negative.   Endocrine: Negative.   Genitourinary: Negative.   Musculoskeletal: Positive for joint swelling.  Neurological: Negative.  Negative for dizziness and headaches.  Hematological: Negative.   Psychiatric/Behavioral: Negative.  Negative for confusion.  All other systems reviewed and are negative.      Objective:   Physical Exam  Constitutional: He is oriented to person, place, and time. He appears well-developed and well-nourished. No distress.  HENT:  Head: Normocephalic.  Right Ear: External ear normal.  Left Ear: External ear normal.  Nose: Nose normal.  Mouth/Throat: Oropharynx is clear and moist.  Eyes: Pupils are equal, round, and reactive to light. Right eye exhibits no discharge. Left eye exhibits no discharge.  Neck: Normal range of motion. Neck supple. No thyromegaly present.  Cardiovascular: Normal rate, regular rhythm, normal heart sounds and intact distal pulses.   No murmur heard. Pulmonary/Chest: Effort normal and breath sounds normal. No respiratory distress. He has no wheezes.  Abdominal: Soft. Bowel sounds are normal. He exhibits no distension. There is no tenderness.    Musculoskeletal: Normal range of motion. He exhibits no edema or tenderness.  Neurological: He is alert and oriented  to person, place, and time. He has normal reflexes. No cranial nerve deficit.  Skin: Skin is warm and dry. No rash noted. No erythema.  Psychiatric: He has a normal mood and affect. His behavior is normal. Judgment and thought content normal.  Vitals reviewed.    BP 158/75 mmHg  Pulse 79  Temp(Src) 97.9 F (36.6 C) (Oral)  Ht 5' 10"  (1.778 m)  Wt 437 lb (198.222 kg)  BMI 62.70 kg/m2      Assessment & Plan:  1. Essential hypertension, benign - amLODipine (NORVASC) 10 MG tablet; Take 1 tablet (10 mg total) by mouth daily.  Dispense: 90 tablet; Refill: 3 - metoprolol (LOPRESSOR) 100 MG tablet; Take 1 tablet (100 mg total) by mouth 2 (two) times daily.  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 Obesity  - CMP14+EGFR; Future  4. Hyperlipidemia - atorvastatin (LIPITOR) 40 MG tablet; Take 1 tablet (40 mg total) by mouth daily.  Dispense: 90 tablet; Refill: 1 - CMP14+EGFR; Future - Lipid panel; Future  5. Essential hypertension - lisinopril (PRINIVIL,ZESTRIL) 40 MG tablet; Take 1 tablet (40 mg total) by mouth daily.  Dispense: 90 tablet; Refill: 3 - hydrochlorothiazide (HYDRODIURIL) 25 MG tablet; Take 1 tablet (25 mg total) by mouth daily.  Dispense: 90 tablet; Refill: 3 - amLODipine (NORVASC) 10 MG tablet; Take 1 tablet (10 mg total) by mouth daily.  Dispense: 90 tablet; Refill: 3 - metoprolol (LOPRESSOR) 100 MG tablet; Take 1 tablet (100 mg total) by mouth 2 (two) times daily.  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  Dispense: 180 tablet; Refill: 0 - POCT glycosylated hemoglobin (Hb A1C); Future - POCT UA -  Microalbumin; Future - CMP14+EGFR; Future  7. Encounter for vitamin deficiency screening - CMP14+EGFR; Future - Vit D  25 hydroxy (rtn osteoporosis monitoring); Future   Continue all meds Labs ordered- pt just ate- Will get labs drawn tomorrow Health Maintenance reviewed-hemoccult cards given to patient with directions Diet and exercise encouraged RTO 4 months  Evelina Dun, FNP

## 2015-01-06 ENCOUNTER — Other Ambulatory Visit (INDEPENDENT_AMBULATORY_CARE_PROVIDER_SITE_OTHER): Payer: BC Managed Care – PPO

## 2015-01-06 DIAGNOSIS — E785 Hyperlipidemia, unspecified: Secondary | ICD-10-CM

## 2015-01-06 DIAGNOSIS — Z1321 Encounter for screening for nutritional disorder: Secondary | ICD-10-CM

## 2015-01-06 DIAGNOSIS — E669 Obesity, unspecified: Secondary | ICD-10-CM

## 2015-01-06 DIAGNOSIS — I10 Essential (primary) hypertension: Secondary | ICD-10-CM

## 2015-01-06 DIAGNOSIS — E139 Other specified diabetes mellitus without complications: Secondary | ICD-10-CM

## 2015-01-06 LAB — POCT GLYCOSYLATED HEMOGLOBIN (HGB A1C): Hemoglobin A1C: 9.3

## 2015-01-06 NOTE — Progress Notes (Signed)
Lab only 

## 2015-01-07 LAB — CMP14+EGFR
ALT: 62 IU/L — ABNORMAL HIGH (ref 0–44)
AST: 35 IU/L (ref 0–40)
Albumin/Globulin Ratio: 1.6 (ref 1.1–2.5)
Albumin: 3.9 g/dL (ref 3.5–5.5)
Alkaline Phosphatase: 110 IU/L (ref 39–117)
BUN/Creatinine Ratio: 9 (ref 9–20)
BUN: 10 mg/dL (ref 6–24)
CO2: 27 mmol/L (ref 18–29)
Calcium: 9.2 mg/dL (ref 8.7–10.2)
Chloride: 96 mmol/L — ABNORMAL LOW (ref 97–108)
Creatinine, Ser: 1.1 mg/dL (ref 0.76–1.27)
GFR calc Af Amer: 89 mL/min/{1.73_m2} (ref >59)
GFR calc non Af Amer: 77 mL/min/{1.73_m2} (ref >59)
Globulin, Total: 2.5 g/dL (ref 1.5–4.5)
Glucose: 241 mg/dL — ABNORMAL HIGH (ref 65–99)
Potassium: 4.1 mmol/L (ref 3.5–5.2)
Sodium: 139 mmol/L (ref 134–144)
Total Bilirubin: 0.4 mg/dL (ref 0.0–1.2)
Total Protein: 6.4 g/dL (ref 6.0–8.5)

## 2015-01-07 LAB — LIPID PANEL
Chol/HDL Ratio: 4.7 ratio units (ref 0.0–5.0)
Cholesterol, Total: 189 mg/dL (ref 100–199)
HDL: 40 mg/dL (ref >39)
LDL Calculated: 124 mg/dL — ABNORMAL HIGH (ref 0–99)
Triglycerides: 126 mg/dL (ref 0–149)
VLDL Cholesterol Cal: 25 mg/dL (ref 5–40)

## 2015-01-07 LAB — VITAMIN D 25 HYDROXY (VIT D DEFICIENCY, FRACTURES): Vit D, 25-Hydroxy: 9.1 ng/mL — ABNORMAL LOW (ref 30.0–100.0)

## 2015-01-08 ENCOUNTER — Other Ambulatory Visit: Payer: Self-pay | Admitting: Family

## 2015-01-08 MED ORDER — CANAGLIFLOZIN-METFORMIN HCL 50-1000 MG PO TABS: 1.0000 | ORAL_TABLET | Freq: Two times a day (BID) | ORAL | Status: DC

## 2015-01-08 MED ORDER — ATORVASTATIN CALCIUM 80 MG PO TABS: 80.0000 mg | ORAL_TABLET | Freq: Every day | ORAL | Status: DC

## 2015-03-03 ENCOUNTER — Ambulatory Visit (INDEPENDENT_AMBULATORY_CARE_PROVIDER_SITE_OTHER): Payer: BLUE CROSS/BLUE SHIELD

## 2015-03-03 ENCOUNTER — Ambulatory Visit (INDEPENDENT_AMBULATORY_CARE_PROVIDER_SITE_OTHER): Payer: BLUE CROSS/BLUE SHIELD | Admitting: Physician Assistant

## 2015-03-03 ENCOUNTER — Encounter: Payer: Self-pay | Admitting: Physician Assistant

## 2015-03-03 VITALS — BP 128/78 | HR 67 | Temp 97.4°F | Ht 70.0 in | Wt >= 6400 oz

## 2015-03-03 DIAGNOSIS — J189 Pneumonia, unspecified organism: Secondary | ICD-10-CM | POA: Diagnosis not present

## 2015-03-03 NOTE — Patient Instructions (Signed)
Diabetes and Exercise Exercising regularly is important. It is not just about losing weight. It has many health benefits, such as:  Improving your overall fitness, flexibility, and endurance.  Increasing your bone density.  Helping with weight control.  Decreasing your body fat.  Increasing your muscle strength.  Reducing stress and tension.  Improving your overall health. People with diabetes who exercise gain additional benefits because exercise:  Reduces appetite.  Improves the body's use of blood sugar (glucose).  Helps lower or control blood glucose.  Decreases blood pressure.  Helps control blood lipids (such as cholesterol and triglycerides).  Improves the body's use of the hormone insulin by:  Increasing the body's insulin sensitivity.  Reducing the body's insulin needs.  Decreases the risk for heart disease because exercising:  Lowers cholesterol and triglycerides levels.  Increases the levels of good cholesterol (such as high-density lipoproteins [HDL]) in the body.  Lowers blood glucose levels. YOUR ACTIVITY PLAN  Choose an activity that you enjoy and set realistic goals. Your health care provider or diabetes educator can help you make an activity plan that works for you. Exercise regularly as directed by your health care provider. This includes:  Performing resistance training twice a week such as push-ups, sit-ups, lifting weights, or using resistance bands.  Performing 150 minutes of cardio exercises each week such as walking, running, or playing sports.  Staying active and spending no more than 90 minutes at one time being inactive. Even short bursts of exercise are good for you. Three 10-minute sessions spread throughout the day are just as beneficial as a single 30-minute session. Some exercise ideas include:  Taking the dog for a walk.  Taking the stairs instead of the elevator.  Dancing to your favorite song.  Doing an exercise  video.  Doing your favorite exercise with a friend. RECOMMENDATIONS FOR EXERCISING WITH TYPE 1 OR TYPE 2 DIABETES   Check your blood glucose before exercising. If blood glucose levels are greater than 240 mg/dL, check for urine ketones. Do not exercise if ketones are present.  Avoid injecting insulin into areas of the body that are going to be exercised. For example, avoid injecting insulin into:  The arms when playing tennis.  The legs when jogging.  Keep a record of:  Food intake before and after you exercise.  Expected peak times of insulin action.  Blood glucose levels before and after you exercise.  The type and amount of exercise you have done.  Review your records with your health care provider. Your health care provider will help you to develop guidelines for adjusting food intake and insulin amounts before and after exercising.  If you take insulin or oral hypoglycemic agents, watch for signs and symptoms of hypoglycemia. They include:  Dizziness.  Shaking.  Sweating.  Chills.  Confusion.  Drink plenty of water while you exercise to prevent dehydration or heat stroke. Body water is lost during exercise and must be replaced.  Talk to your health care provider before starting an exercise program to make sure it is safe for you. Remember, almost any type of activity is better than none. Document Released: 02/17/2004 Document Revised: 04/13/2014 Document Reviewed: 05/06/2013 Exit[employer] Patient Information 2015 [employer]. This information is not intended to replace advice given to you by your health care provider. Make sure you discuss any questions you have with your health care provider. Diabetes Mellitus and Food It is important for you to manage your blood sugar (glucose) level. Your blood glucose level can   be greatly affected by what you eat. Eating healthier foods in the appropriate amounts throughout the day at about the same time each day will help you  control your blood glucose level. It can also help slow or prevent worsening of your diabetes mellitus. Healthy eating may even help you improve the level of your blood pressure and reach or maintain a healthy weight.  HOW CAN FOOD AFFECT ME? Carbohydrates Carbohydrates affect your blood glucose level more than any other type of food. Your dietitian will help you determine how many carbohydrates to eat at each meal and teach you how to count carbohydrates. Counting carbohydrates is important to keep your blood glucose at a healthy level, especially if you are using insulin or taking certain medicines for diabetes mellitus. Alcohol Alcohol can cause sudden decreases in blood glucose (hypoglycemia), especially if you use insulin or take certain medicines for diabetes mellitus. Hypoglycemia can be a life-threatening condition. Symptoms of hypoglycemia (sleepiness, dizziness, and disorientation) are similar to symptoms of having too much alcohol.  If your health care provider has given you approval to drink alcohol, do so in moderation and use the following guidelines:  Women should not have more than one drink per day, and men should not have more than two drinks per day. One drink is equal to:  12 oz of beer.  5 oz of wine.  1 oz of hard liquor.  Do not drink on an empty stomach.  Keep yourself hydrated. Have water, diet soda, or unsweetened iced tea.  Regular soda, juice, and other mixers might contain a lot of carbohydrates and should be counted. WHAT FOODS ARE NOT RECOMMENDED? As you make food choices, it is important to remember that all foods are not the same. Some foods have fewer nutrients per serving than other foods, even though they might have the same number of calories or carbohydrates. It is difficult to get your body what it needs when you eat foods with fewer nutrients. Examples of foods that you should avoid that are high in calories and carbohydrates but low in nutrients  include:  Trans fats (most processed foods list trans fats on the Nutrition Facts label).  Regular soda.  Juice.  Candy.  Sweets, such as cake, pie, doughnuts, and cookies.  Fried foods. WHAT FOODS CAN I EAT? Have nutrient-rich foods, which will nourish your body and keep you healthy. The food you should eat also will depend on several factors, including:  The calories you need.  The medicines you take.  Your weight.  Your blood glucose level.  Your blood pressure level.  Your cholesterol level. You also should eat a variety of foods, including:  Protein, such as meat, poultry, fish, tofu, nuts, and seeds (lean animal proteins are best).  Fruits.  Vegetables.  Dairy products, such as milk, cheese, and yogurt (low fat is best).  Breads, grains, pasta, cereal, rice, and beans.  Fats such as olive oil, trans fat-free margarine, canola oil, avocado, and olives. DOES EVERYONE WITH DIABETES MELLITUS HAVE THE SAME MEAL PLAN? Because every person with diabetes mellitus is different, there is not one meal plan that works for everyone. It is very important that you meet with a dietitian who will help you create a meal plan that is just right for you. Document Released: 08/24/2005 Document Revised: 12/02/2013 Document Reviewed: 10/24/2013 ExitCare Patient Information 2015 [employer]. This information is not intended to replace advice given to you by your health care provider. Make sure you discuss any   questions you have with your health care provider.  

## 2015-03-03 NOTE — Progress Notes (Signed)
   Subjective:    Patient ID: Jason Meyer, male    DOB: 05-Oct-1961, 54 y.o.   MRN: [id number]  HPI 54 y/o male presents for f/u of hospital [facility]-March 9 for bronchitis that developed into pneumonia. Still having a nonproductive dry cough. No fever, sweats, chills. Also wants to return to taking Metformin 500mg  BID instead of the new medicine he was prescribed at his last visit on 01/05/2015.    Review of Systems  Constitutional: Negative.   HENT: Negative.   Respiratory: Positive for cough (nonproductive). Negative for shortness of breath and wheezing.   Cardiovascular: Negative.        Objective:   Physical Exam  Constitutional: He appears well-developed and well-nourished. No distress.  obese  Cardiovascular: Normal rate, regular rhythm and normal heart sounds.  Exam reveals no gallop and no friction rub.   No murmur heard. Pulmonary/Chest: Effort normal. No respiratory distress. He has no wheezes. He has rales (mild crackles in LUL on exam ). He exhibits no tenderness.  Xray exhibits no change from baseline xrays. No treatment needed. Cardiomegaly  Skin: He is not diaphoretic.  Nursing note and vitals reviewed.         Assessment & Plan:  1. Diabetes Mellitis II: Change medication to Metformin 500mg  BID at patient's request d/t cost. However, I have discussed with patient the importance of eating a proper diet and checking FBS on a daily basis. He understands. If not improvement of HA1C at f/u will possibly add Januvia to current regimen.   2. Pneumonia: Resolved. No further treatment needed.   Discussed with patient the need for diet and weight loss.   F/U in 1 month.

## 2015-03-31 ENCOUNTER — Ambulatory Visit: Payer: BLUE CROSS/BLUE SHIELD | Admitting: Physician Assistant

## 2015-04-07 ENCOUNTER — Ambulatory Visit: Payer: BLUE CROSS/BLUE SHIELD | Admitting: Physician Assistant

## 2015-04-12 ENCOUNTER — Other Ambulatory Visit: Payer: Self-pay | Admitting: Physician Assistant

## 2015-04-13 ENCOUNTER — Ambulatory Visit: Payer: BLUE CROSS/BLUE SHIELD | Admitting: Physician Assistant

## 2015-04-13 MED ORDER — METFORMIN HCL 500 MG PO TABS: 500.0000 mg | ORAL_TABLET | Freq: Two times a day (BID) | ORAL | Status: DC

## 2015-04-13 NOTE — Telephone Encounter (Signed)
rx sent to [facility] and detailed message left for patient.

## 2015-05-03 ENCOUNTER — Ambulatory Visit (INDEPENDENT_AMBULATORY_CARE_PROVIDER_SITE_OTHER): Payer: BLUE CROSS/BLUE SHIELD | Admitting: Physician Assistant

## 2015-05-03 ENCOUNTER — Encounter: Payer: Self-pay | Admitting: Physician Assistant

## 2015-05-03 VITALS — BP 158/87 | HR 68 | Temp 97.3°F | Ht 70.0 in | Wt >= 6400 oz

## 2015-05-03 DIAGNOSIS — I8311 Varicose veins of right lower extremity with inflammation: Secondary | ICD-10-CM | POA: Diagnosis not present

## 2015-05-03 DIAGNOSIS — I8312 Varicose veins of left lower extremity with inflammation: Secondary | ICD-10-CM | POA: Diagnosis not present

## 2015-05-03 DIAGNOSIS — E1159 Type 2 diabetes mellitus with other circulatory complications: Secondary | ICD-10-CM | POA: Diagnosis not present

## 2015-05-03 DIAGNOSIS — I872 Venous insufficiency (chronic) (peripheral): Secondary | ICD-10-CM

## 2015-05-03 DIAGNOSIS — I10 Essential (primary) hypertension: Secondary | ICD-10-CM | POA: Diagnosis not present

## 2015-05-03 LAB — POCT CBC
Granulocyte percent: 72.5 %G (ref 37–80)
HCT, POC: 45 % (ref 43.5–53.7)
Hemoglobin: 14.3 g/dL (ref 14.1–18.1)
Lymph, poc: 2.4 (ref 0.6–3.4)
MCH, POC: 25.8 pg — AB (ref 27–31.2)
MCHC: 31.8 g/dL (ref 31.8–35.4)
MCV: 81.3 fL (ref 80–97)
MPV: 8.1 fL (ref 0–99.8)
POC Granulocyte: 7.3 — AB (ref 2–6.9)
POC LYMPH PERCENT: 23.7 %L (ref 10–50)
Platelet Count, POC: 238 10*3/uL (ref 142–424)
RBC: 5.54 M/uL (ref 4.69–6.13)
RDW, POC: 14.5 %
WBC: 10.1 10*3/uL (ref 4.6–10.2)

## 2015-05-03 LAB — POCT GLYCOSYLATED HEMOGLOBIN (HGB A1C): Hemoglobin A1C: 9.2

## 2015-05-03 MED ORDER — CETIRIZINE HCL 10 MG PO TABS: 10.0000 mg | ORAL_TABLET | Freq: Every day | ORAL | Status: DC

## 2015-05-03 MED ORDER — TRIAMCINOLONE ACETONIDE 0.1 % EX CREA: 1.0000 "application " | TOPICAL_CREAM | Freq: Two times a day (BID) | CUTANEOUS | Status: DC

## 2015-05-03 NOTE — Patient Instructions (Signed)
Use Dove soap. Apply moisturizer within 3 minutes of showering for maximum absorption.  Can apply Triamcinolone twice daily. Use under compression hose.

## 2015-05-03 NOTE — Progress Notes (Signed)
   Subjective:    Patient ID: Jason Meyer, male    DOB: 11/29/1961, 54 y.o.   MRN: [id number]  HPI53 y/o male with comorbid DM, HTN, morbid obesity presents for f/u of Hemoglobin A1C after restarting his Metformin 500 BID at his March visit. He has not been monitoring his FBS at home. Denies any episodes of dizziness, lightheadedness.   Also has c/o BLE edema and scaling/dryness of legs indicitive of stasis dermatitis  C/o occasional cough    Review of Systems  Respiratory: Positive for cough (dry nonproductive, occasional). Negative for shortness of breath and wheezing.   Cardiovascular: Negative.   Skin:       Extreme dryness, itching on BLE knee down Bilateral LE swelling  All other systems reviewed and are negative.      Objective:   Physical Exam  Constitutional: He is oriented to person, place, and time. No distress.  Morbidly obese   Cardiovascular: Normal rate, regular rhythm and normal heart sounds.  Exam reveals no gallop and no friction rub.   No murmur heard. Pulmonary/Chest: Effort normal and breath sounds normal. No respiratory distress. He has no wheezes. He has no rales. He exhibits no tenderness.  Musculoskeletal: He exhibits edema (BLE edema with scaling and excoriations from scratching, 1+ edema). He exhibits no tenderness.  Neurological: He is alert and oriented to person, place, and time. No cranial nerve deficit.  Skin: He is not diaphoretic. No erythema.  Psychiatric: He has a normal mood and affect. His behavior is normal. Judgment and thought content normal.  Nursing note and vitals reviewed.  Results for orders placed or performed in visit on 05/03/15  POCT CBC  Result Value Ref Range   WBC 10.1 4.6 - 10.2 K/uL   Lymph, poc 2.4 0.6 - 3.4   POC LYMPH PERCENT 23.7 10 - 50 %L   POC Granulocyte 7.3 (A) 2 - 6.9   Granulocyte percent 72.5 37 - 80 %G   RBC 5.54 4.69 - 6.13 M/uL   Hemoglobin 14.3 14.1 - 18.1 g/dL   HCT, POC 45.0 43.5 - 53.7 %   MCV  81.3 80 - 97 fL   MCH, POC 25.8 (A) 27 - 31.2 pg   MCHC 31.8 31.8 - 35.4 g/dL   RDW, POC 14.5 %   Platelet Count, POC 238 142 - 424 K/uL   MPV 8.1 0 - 99.8 fL  POCT glycosylated hemoglobin (Hb A1C)  Result Value Ref Range   Hemoglobin A1C 9.2            Assessment & Plan:  1. Stasis dermatitis of both legs - Dove soap  - triamcinolone cream (KENALOG) 0.1 %; Apply 1 application topically 2 (two) times daily.  Dispense: 453.6 g; Refill: 3 - cetirizine (ZYRTEC) 10 MG tablet; Take 1 tablet (10 mg total) by mouth daily.  Dispense: 30 tablet; Refill: 11 - Aerobic culture  2. Essential hypertension  - POCT CBC - CMP14+EGFR  3. Type 2 diabetes mellitus with other circulatory complications  - POCT glycosylated hemoglobin (Hb A1C)   Continue all meds Labs pending Health Maintenance reviewed Diet and exercise encouraged, patient is noncompliant  RTO 3 months  Maysie Parkhill A. Benjamin Stain PA-C

## 2015-05-04 LAB — CMP14+EGFR
ALT: 44 IU/L (ref 0–44)
AST: 22 IU/L (ref 0–40)
Albumin/Globulin Ratio: 1.7 (ref 1.1–2.5)
Albumin: 4.3 g/dL (ref 3.5–5.5)
Alkaline Phosphatase: 109 IU/L (ref 39–117)
BUN/Creatinine Ratio: 14 (ref 9–20)
BUN: 15 mg/dL (ref 6–24)
Bilirubin Total: 0.3 mg/dL (ref 0.0–1.2)
CO2: 27 mmol/L (ref 18–29)
Calcium: 9.4 mg/dL (ref 8.7–10.2)
Chloride: 95 mmol/L — ABNORMAL LOW (ref 97–108)
Creatinine, Ser: 1.1 mg/dL (ref 0.76–1.27)
GFR calc Af Amer: 89 mL/min/{1.73_m2} (ref >59)
GFR calc non Af Amer: 77 mL/min/{1.73_m2} (ref >59)
Globulin, Total: 2.5 g/dL (ref 1.5–4.5)
Glucose: 242 mg/dL — ABNORMAL HIGH (ref 65–99)
Potassium: 4.4 mmol/L (ref 3.5–5.2)
Sodium: 138 mmol/L (ref 134–144)
Total Protein: 6.8 g/dL (ref 6.0–8.5)

## 2015-05-05 ENCOUNTER — Other Ambulatory Visit: Payer: Self-pay | Admitting: Physician Assistant

## 2015-05-05 DIAGNOSIS — IMO0002 Reserved for concepts with insufficient information to code with codable children: Secondary | ICD-10-CM

## 2015-05-05 DIAGNOSIS — E1165 Type 2 diabetes mellitus with hyperglycemia: Secondary | ICD-10-CM

## 2015-05-05 LAB — AEROBIC CULTURE

## 2015-05-05 MED ORDER — METFORMIN HCL 1000 MG PO TABS: 1000.0000 mg | ORAL_TABLET | Freq: Two times a day (BID) | ORAL | Status: DC

## 2015-05-05 MED ORDER — GLIMEPIRIDE 2 MG PO TABS: 2.0000 mg | ORAL_TABLET | Freq: Every day | ORAL | Status: DC

## 2015-07-20 ENCOUNTER — Other Ambulatory Visit: Payer: Self-pay | Admitting: Family Medicine

## 2015-08-05 ENCOUNTER — Other Ambulatory Visit: Payer: Self-pay | Admitting: Family Medicine

## 2015-08-11 ENCOUNTER — Ambulatory Visit: Payer: BLUE CROSS/BLUE SHIELD | Admitting: Physician Assistant

## 2015-08-19 ENCOUNTER — Ambulatory Visit (INDEPENDENT_AMBULATORY_CARE_PROVIDER_SITE_OTHER): Payer: BLUE CROSS/BLUE SHIELD | Admitting: Physician Assistant

## 2015-08-19 ENCOUNTER — Encounter: Payer: Self-pay | Admitting: Physician Assistant

## 2015-08-19 ENCOUNTER — Ambulatory Visit (INDEPENDENT_AMBULATORY_CARE_PROVIDER_SITE_OTHER): Payer: BLUE CROSS/BLUE SHIELD

## 2015-08-19 VITALS — BP 146/92 | HR 63 | Ht 70.0 in

## 2015-08-19 DIAGNOSIS — I10 Essential (primary) hypertension: Secondary | ICD-10-CM | POA: Diagnosis not present

## 2015-08-19 DIAGNOSIS — M25512 Pain in left shoulder: Secondary | ICD-10-CM | POA: Diagnosis not present

## 2015-08-19 DIAGNOSIS — E139 Other specified diabetes mellitus without complications: Secondary | ICD-10-CM | POA: Diagnosis not present

## 2015-08-19 MED ORDER — DICLOFENAC SODIUM 75 MG PO TBEC: 75.0000 mg | DELAYED_RELEASE_TABLET | Freq: Two times a day (BID) | ORAL | Status: DC

## 2015-08-19 MED ORDER — HYDROCODONE-ACETAMINOPHEN 10-325 MG PO TABS: 1.0000 | ORAL_TABLET | Freq: Four times a day (QID) | ORAL | Status: DC | PRN

## 2015-08-19 NOTE — Progress Notes (Signed)
   Subjective:    Patient ID: Jason Meyer, male    DOB: 1961-09-03, 54 y.o.   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   He also has c/o left shoulder pain x 1.5 weeks, began hurting suddenly. Pain is constant throbbing, worse at night regardless of how he lays, waking him up at night. Better with pulling across his body with the other arm. Tried Aleve 1 tablet once to twice daily with no relief.     Review of Systems  Constitutional: Negative.   HENT: Negative.   Eyes: Negative.   Respiratory: Negative.   Cardiovascular: Negative.   Gastrointestinal: Negative.   Endocrine: Negative.   Genitourinary: Negative.   Musculoskeletal:       Left shoulder pain   Skin: Negative.   Allergic/Immunologic: Negative.   Neurological: Negative.   Hematological: Negative.   Psychiatric/Behavioral: Negative.        Objective:   Physical Exam  Constitutional: He is oriented to person, place, and time. No distress.  Morbidly obese   HENT:  Head: Normocephalic.  Cardiovascular: Normal rate, regular rhythm and normal heart sounds.  Exam reveals no gallop and no friction rub.   No murmur heard. Pulmonary/Chest: Effort normal and breath sounds normal. No respiratory distress. He has no wheezes. He has no rales. He exhibits no tenderness.  Musculoskeletal: Normal range of motion. He exhibits tenderness (left deltoid ).  Neurological: He is alert and oriented to person, place, and time.  Skin: He is not diaphoretic.  Psychiatric: He has a normal mood and affect. His behavior is normal. Judgment and thought content normal.  Nursing note and vitals reviewed.         Assessment & Plan:  1. Pain in joint, shoulder region, left  - DG Shoulder Left; Future - Ambulatory referral to Orthopedic Surgery - diclofenac (VOLTAREN) 75 MG EC tablet; Take 1 tablet (75 mg total) by mouth 2 (two) times daily.  Dispense:  30 tablet; Refill: 0 - HYDROcodone-acetaminophen (NORCO) 10-325 MG per tablet; Take 1 tablet by mouth every 6 (six) hours as needed.  Dispense: 40 tablet; Refill: 0 - EKG 12-Lead - CBC with Differential/Platelet  2. Essential hypertension - Encouraged compliance with medication and weight loss  - BMP8+EGFR - CBC with Differential/Platelet  3. Diabetes 1.5, managed as type 2 -Encouraged medication compliance and weight loss - POCT glycosylated hemoglobin (Hb A1C) - BMP8+EGFR - CBC with Differential/Platelet   Continue all meds Labs pending Health Maintenance reviewed Diet and exercise encouraged RTO 3 months   Loyalty Brashier A. Benjamin Stain PA-C

## 2015-08-20 ENCOUNTER — Telehealth: Payer: Self-pay | Admitting: Physician Assistant

## 2015-08-20 NOTE — Telephone Encounter (Signed)
Returning call about xray. Patient notified of results.

## 2015-09-13 ENCOUNTER — Other Ambulatory Visit: Payer: Self-pay | Admitting: Physician Assistant

## 2015-09-13 ENCOUNTER — Other Ambulatory Visit: Payer: BLUE CROSS/BLUE SHIELD

## 2015-09-13 LAB — POCT GLYCOSYLATED HEMOGLOBIN (HGB A1C): Hemoglobin A1C: 7.9

## 2015-09-14 LAB — BMP8+EGFR
BUN/Creatinine Ratio: 11 (ref 9–20)
BUN: 13 mg/dL (ref 6–24)
CO2: 30 mmol/L — ABNORMAL HIGH (ref 18–29)
Calcium: 9.9 mg/dL (ref 8.7–10.2)
Chloride: 95 mmol/L — ABNORMAL LOW (ref 97–108)
Creatinine, Ser: 1.21 mg/dL (ref 0.76–1.27)
GFR calc Af Amer: 79 mL/min/{1.73_m2} (ref >59)
GFR calc non Af Amer: 68 mL/min/{1.73_m2} (ref >59)
Glucose: 194 mg/dL — ABNORMAL HIGH (ref 65–99)
Potassium: 5.3 mmol/L — ABNORMAL HIGH (ref 3.5–5.2)
Sodium: 140 mmol/L (ref 134–144)

## 2015-09-14 LAB — CBC WITH DIFFERENTIAL/PLATELET
Basophils Absolute: 0 10*3/uL (ref 0.0–0.2)
Basos: 0 %
EOS (ABSOLUTE): 0.2 10*3/uL (ref 0.0–0.4)
Eos: 1 %
Hematocrit: 44.3 % (ref 37.5–51.0)
Hemoglobin: 14.8 g/dL (ref 12.6–17.7)
Immature Grans (Abs): 0 10*3/uL (ref 0.0–0.1)
Immature Granulocytes: 0 %
Lymphocytes Absolute: 2.8 10*3/uL (ref 0.7–3.1)
Lymphs: 25 %
MCH: 27.1 pg (ref 26.6–33.0)
MCHC: 33.4 g/dL (ref 31.5–35.7)
MCV: 81 fL (ref 79–97)
Monocytes Absolute: 0.7 10*3/uL (ref 0.1–0.9)
Monocytes: 6 %
Neutrophils Absolute: 7.6 10*3/uL — ABNORMAL HIGH (ref 1.4–7.0)
Neutrophils: 68 %
Platelets: 309 10*3/uL (ref 150–379)
RBC: 5.47 x10E6/uL (ref 4.14–5.80)
RDW: 14.8 % (ref 12.3–15.4)
WBC: 11.3 10*3/uL — ABNORMAL HIGH (ref 3.4–10.8)

## 2015-09-14 MED ORDER — METFORMIN HCL 1000 MG PO TABS: 1000.0000 mg | ORAL_TABLET | Freq: Two times a day (BID) | ORAL | Status: DC

## 2015-09-14 MED ORDER — ATORVASTATIN CALCIUM 80 MG PO TABS: 80.0000 mg | ORAL_TABLET | Freq: Every day | ORAL | Status: DC

## 2015-09-14 MED ORDER — MELOXICAM 15 MG PO TABS: 15.0000 mg | ORAL_TABLET | Freq: Every day | ORAL | Status: DC

## 2015-09-14 NOTE — Telephone Encounter (Signed)
done

## 2015-09-20 ENCOUNTER — Ambulatory Visit (INDEPENDENT_AMBULATORY_CARE_PROVIDER_SITE_OTHER): Payer: BLUE CROSS/BLUE SHIELD | Admitting: Family Medicine

## 2015-09-20 ENCOUNTER — Encounter: Payer: Self-pay | Admitting: Family Medicine

## 2015-09-20 VITALS — BP 150/89 | HR 61 | Temp 97.3°F | Ht 70.0 in | Wt >= 6400 oz

## 2015-09-20 DIAGNOSIS — E875 Hyperkalemia: Secondary | ICD-10-CM | POA: Diagnosis not present

## 2015-09-20 DIAGNOSIS — I10 Essential (primary) hypertension: Secondary | ICD-10-CM

## 2015-09-20 DIAGNOSIS — I878 Other specified disorders of veins: Secondary | ICD-10-CM | POA: Insufficient documentation

## 2015-09-20 MED ORDER — NAPROXEN 500 MG PO TABS: 500.0000 mg | ORAL_TABLET | Freq: Every day | ORAL | Status: DC | PRN

## 2015-09-20 MED ORDER — LISINOPRIL 40 MG PO TABS: 40.0000 mg | ORAL_TABLET | Freq: Every day | ORAL | Status: DC

## 2015-09-20 NOTE — Patient Instructions (Addendum)
Great to meet you!  Let have you come back for labs in 2 weeks  Come back to see me in 2 months.   We will be keeping a close eye on your potassium

## 2015-09-20 NOTE — Progress Notes (Signed)
   HPI  Patient presents today here for follow-up of hypertension and shoulder pain.  Shoulder pain Helped very much by mobic, he would like to know if there is a good long-term alternative for this medication Improved since last visit.  Hypertension Not checking at home Compliant with all his medications No chest pain, palpitations, dyspnea He does have leg edema which improves by the morning, however this very much chronic.  Venous stasis Edematous legs for several years, skin changes of venous stasis, he is using triamcinolone as prescribed previously No concern for infection No fever, chills, sweats, nausea or vomiting  PMH: Smoking status noted ROS: Per HPI  Objective: BP 150/89 mmHg  Pulse 61  Temp(Src) 97.3 F (36.3 C) (Oral)  Ht  (1.778 m)  Wt 438 lb 3.2 oz (198.766 kg)  BMI 62.88 kg/m2 Gen: NAD, alert, cooperative with exam HEENT: NCAT CV: RRR, good S1/S2, no murmur Resp: CTABL, no wheezes, non-labored Ext: 1+ pitting edema bilateral lower extremities, skin changes with scaling, thickening of the skin bilateral lower extremities Neuro: Alert and oriented, No gross deficits  Assessment and plan:  # Hypertension On Bactrim dose of amlodipine, lisinopril, hydrochlorothiazide, and beta blocker He is well beta blocked with a pulse of 61 Now with some hyperkalemia, he declines recheck today even after I explained the seriousness of hyperkalemia He'll return in 2 weeks for fasting BMP  # Hyperkalemia Previously high end of normal, no palpitations He is on maximum dose ACE inhibitor Offered, recommended recheck today, however he he declines but agrees to come back within 2 weeks. If blood pressure medication change needed with consider discontinuing ACE inhibitor versus starting with diaphoretic and discontinuing HCTZ  # Shoulder pain Change meloxicam to naproxen  # Venous stasis Topical steroid, consider loop diuretic No orthopnea or exertional dyspnea  to make me concerned for CHF    Meds ordered this encounter  Medications  . lisinopril (PRINIVIL,ZESTRIL) 40 MG tablet    Sig: Take 1 tablet (40 mg total) by mouth daily.    Dispense:  90 tablet    Refill:  0  . naproxen (NAPROSYN) 500 MG tablet    Sig: Take 1 tablet (500 mg total) by mouth daily as needed for moderate pain.    Dispense:  30 tablet    Refill:  2    Murtis Sink, MD [facility] 09/20/2015, 9:26 AM

## 2015-11-22 ENCOUNTER — Other Ambulatory Visit: Payer: BLUE CROSS/BLUE SHIELD

## 2015-11-22 DIAGNOSIS — E875 Hyperkalemia: Secondary | ICD-10-CM

## 2015-11-22 DIAGNOSIS — I10 Essential (primary) hypertension: Secondary | ICD-10-CM

## 2015-11-23 ENCOUNTER — Encounter: Payer: Self-pay | Admitting: Family Medicine

## 2015-11-23 ENCOUNTER — Ambulatory Visit (INDEPENDENT_AMBULATORY_CARE_PROVIDER_SITE_OTHER): Payer: BLUE CROSS/BLUE SHIELD | Admitting: Family Medicine

## 2015-11-23 VITALS — BP 144/84 | HR 65 | Temp 97.6°F | Ht 70.0 in | Wt >= 6400 oz

## 2015-11-23 DIAGNOSIS — M170 Bilateral primary osteoarthritis of knee: Secondary | ICD-10-CM | POA: Diagnosis not present

## 2015-11-23 DIAGNOSIS — I10 Essential (primary) hypertension: Secondary | ICD-10-CM

## 2015-11-23 DIAGNOSIS — E875 Hyperkalemia: Secondary | ICD-10-CM | POA: Diagnosis not present

## 2015-11-23 DIAGNOSIS — E139 Other specified diabetes mellitus without complications: Secondary | ICD-10-CM | POA: Diagnosis not present

## 2015-11-23 DIAGNOSIS — M1990 Unspecified osteoarthritis, unspecified site: Secondary | ICD-10-CM | POA: Insufficient documentation

## 2015-11-23 MED ORDER — NAPROXEN 500 MG PO TABS: 500.0000 mg | ORAL_TABLET | Freq: Every day | ORAL | Status: DC | PRN

## 2015-11-23 MED ORDER — FLUTICASONE PROPIONATE 50 MCG/ACT NA SUSP: 2.0000 | Freq: Every day | NASAL | Status: DC

## 2015-11-23 NOTE — Progress Notes (Signed)
   HPI  Patient presents today . Discussed diabetes, hypertension  Diabetes Not checking blood sugar Diet minimally No exercise or Good medication compliance. He does have some upset stomach and metformin Neuropathy.  Hypertension Good medication compliance Check blood pressure at home No chest pain, palpitations, dyspnea   's report 4 days of wheezing and nasal congestion is worse at night. No malaise, fever, dyspnea, chest pain.  Arthritis Helped by naproxen and/or meloxicam but only minimally.  PMH: Smoking status noted ROS: Per HPI  Objective: BP 144/84 mmHg  Pulse 65  Temp(Src) 97.6 F (36.4 C) (Oral)  Ht 5\' 10"  (1.778 m)  Wt 443 lb 12.8 oz (201.306 kg)  BMI 63.68 kg/m2 Gen: NAD, alert, cooperative with exam HEENT: NCAT CV: RRR, good S1/S2, no murmur Resp: CTABL, no wheezes, non-labored Ext: No edema, warm Neuro: Alert and oriented, No gross deficits  Assessment and plan:  # Arthritis I discussed with him taking only one incident, refer naproxen long-term. I sent a prescription for naproxen today.  # Hypertension Elevated today, improved slightly on recheck Keep a close eye on this and consider increasing medication next month. Also discussed therapeutic lifestyle changes  # Diabetes Last A1c 7.9 Resistant to medication changes, however with upset stomach with metformin he may tolerate a combination metformin better, consider Janumet  # hyperkalemia Last visit have potassium is 5.3, he is on an ACE inhibitor Recheck today Palpitations  HCM Discussed FOBT card and c scope    Meds ordered this encounter  Medications  . DISCONTD: meloxicam (MOBIC) 15 MG tablet    Sig:     Refill:  1  . naproxen (NAPROSYN) 500 MG tablet    Sig: Take 1 tablet (500 mg total) by mouth daily as needed for moderate pain.    Dispense:  30 tablet    Refill:  2    Murtis SinkSam Sho Salguero, MD [facility] 11/23/2015, 9:48 AM

## 2015-11-23 NOTE — Patient Instructions (Signed)
Great to see you!  We will call about your labs  Be sure you are taking either meloxicam or naproxen, I prefer naproxen  Lets see you back next month to re-check your diabetes  Consider the colonoscopy- please bring back the stool blood test.   Diet Recommendations for Diabetes   Starchy (carb) foods include: Bread, rice, pasta, potatoes, corn, crackers, bagels, muffins, all baked goods.   Protein foods include: Meat, fish, poultry, eggs, dairy foods, and beans such as pinto and kidney beans (beans also provide carbohydrate).   1. Eat at least 3 meals and 1-2 snacks per day. Never go more than 4-5 hours while awake without eating.  2. Limit starchy foods to TWO per meal and ONE per snack. ONE portion of a starchy  food is equal to the following:   - ONE slice of bread (or its equivalent, such as half of a hamburger bun).   - 1/2 cup of a "scoopable" starchy food such as potatoes or rice.   - 1 OUNCE (28 grams) of starchy snack foods such as crackers or pretzels (look on label).   - 15 grams of carbohydrate as shown on food label.  3. Both lunch and dinner should include a protein food, a carb food, and vegetables.   - Obtain twice as many veg's as protein or carbohydrate foods for both lunch and dinner.   - Try to keep frozen veg's on hand for a quick vegetable serving.     - Fresh or frozen veg's are best.  4. Breakfast should always include protein.

## 2015-11-24 LAB — BMP8+EGFR
BUN/Creatinine Ratio: 11 (ref 9–20)
BUN: 11 mg/dL (ref 6–24)
CO2: 26 mmol/L (ref 18–29)
Calcium: 9.6 mg/dL (ref 8.7–10.2)
Chloride: 96 mmol/L (ref 96–106)
Creatinine, Ser: 1 mg/dL (ref 0.76–1.27)
GFR calc Af Amer: 100 mL/min/{1.73_m2} (ref >59)
GFR calc non Af Amer: 86 mL/min/{1.73_m2} (ref >59)
Glucose: 199 mg/dL — ABNORMAL HIGH (ref 65–99)
Potassium: 4.1 mmol/L (ref 3.5–5.2)
Sodium: 139 mmol/L (ref 134–144)

## 2015-12-24 ENCOUNTER — Ambulatory Visit (INDEPENDENT_AMBULATORY_CARE_PROVIDER_SITE_OTHER): Payer: BLUE CROSS/BLUE SHIELD | Admitting: Family Medicine

## 2015-12-24 ENCOUNTER — Encounter: Payer: Self-pay | Admitting: Family Medicine

## 2015-12-24 ENCOUNTER — Ambulatory Visit: Payer: BLUE CROSS/BLUE SHIELD | Admitting: Family Medicine

## 2015-12-24 VITALS — BP 140/86 | HR 62 | Temp 97.5°F | Ht 70.0 in | Wt >= 6400 oz

## 2015-12-24 DIAGNOSIS — E139 Other specified diabetes mellitus without complications: Secondary | ICD-10-CM

## 2015-12-24 DIAGNOSIS — E875 Hyperkalemia: Secondary | ICD-10-CM | POA: Diagnosis not present

## 2015-12-24 DIAGNOSIS — I10 Essential (primary) hypertension: Secondary | ICD-10-CM

## 2015-12-24 LAB — POCT GLYCOSYLATED HEMOGLOBIN (HGB A1C): Hemoglobin A1C: 7.8

## 2015-12-24 MED ORDER — METFORMIN HCL 1000 MG PO TABS: 1000.0000 mg | ORAL_TABLET | Freq: Two times a day (BID) | ORAL | Status: DC

## 2015-12-24 MED ORDER — ATORVASTATIN CALCIUM 80 MG PO TABS: 80.0000 mg | ORAL_TABLET | Freq: Every day | ORAL | Status: DC

## 2015-12-24 NOTE — Progress Notes (Signed)
   HPI  Patient presents today for HTN, DM2  Hypertension Good medication compliance Does not check her pressure home. Denies chest pain, dyspnea, palpitations, leg edema.  Diabetes Good med compliance but didn't realize which meds were for dm2 and ran out for 1-2 weeks Watching diet better Not checking CBGs  No palpitations  PMH: Smoking status noted ROS: Per HPI  Objective: BP 140/86 mmHg  Pulse 62  Temp(Src) 97.5 F (36.4 C) (Oral)  Ht '5\' 10"'$  (1.778 m)  Wt 436 lb 9.6 oz (198.04 kg)  BMI 62.65 kg/m2 Gen: NAD, alert, cooperative with exam HEENT: NCAT CV: RRR, good S1/S2, no murmur Resp: CTABL, no wheezes, non-labored Ext:  No pitting, large legs, compression stockings in place Neuro: Alert and oriented, No gross deficits  Assessment and plan:  # DM2 Control improving congratulated Continue metformin  Needs foot exam- next visit Needs optho- referral written  # HTN IMproved, controlled Continue current meds Labs- previous hyperkalmia  # hyperkalemia Previously elevated, I am being very cautious with lisinopril Re-check   Orders Placed This Encounter  Procedures  . CMP14+EGFR  . POCT glycosylated hemoglobin (Hb A1C)    Meds ordered this encounter  Medications  . atorvastatin (LIPITOR) 80 MG tablet    Sig: Take 1 tablet (80 mg total) by mouth daily.    Dispense:  90 tablet    Refill:  3  . metFORMIN (GLUCOPHAGE) 1000 MG tablet    Sig: Take 1 tablet (1,000 mg total) by mouth 2 (two) times daily with a meal.    Dispense:  180 tablet    Refill:  [place], MD [facility] 12/24/2015, 1:42 PM

## 2015-12-24 NOTE — Patient Instructions (Signed)
Great to see you!  Come back in 3 months  You are doing god with your diet, keep it up!  Next visit come fasting

## 2015-12-25 LAB — CMP14+EGFR
ALT: 38 IU/L (ref 0–44)
AST: 23 IU/L (ref 0–40)
Albumin/Globulin Ratio: 1.5 (ref 1.1–2.5)
Albumin: 4.3 g/dL (ref 3.5–5.5)
Alkaline Phosphatase: 95 IU/L (ref 39–117)
BUN/Creatinine Ratio: 14 (ref 9–20)
BUN: 15 mg/dL (ref 6–24)
Bilirubin Total: 0.3 mg/dL (ref 0.0–1.2)
CO2: 29 mmol/L (ref 18–29)
Calcium: 9.7 mg/dL (ref 8.7–10.2)
Chloride: 98 mmol/L (ref 96–106)
Creatinine, Ser: 1.09 mg/dL (ref 0.76–1.27)
GFR calc Af Amer: 89 mL/min/{1.73_m2} (ref >59)
GFR calc non Af Amer: 77 mL/min/{1.73_m2} (ref >59)
Globulin, Total: 2.8 g/dL (ref 1.5–4.5)
Glucose: 130 mg/dL — ABNORMAL HIGH (ref 65–99)
Potassium: 4.6 mmol/L (ref 3.5–5.2)
Sodium: 141 mmol/L (ref 134–144)
Total Protein: 7.1 g/dL (ref 6.0–8.5)

## 2016-01-04 ENCOUNTER — Telehealth: Payer: Self-pay | Admitting: Family

## 2016-01-04 DIAGNOSIS — I10 Essential (primary) hypertension: Secondary | ICD-10-CM

## 2016-01-04 MED ORDER — AMLODIPINE BESYLATE 10 MG PO TABS: 10.0000 mg | ORAL_TABLET | Freq: Every day | ORAL | Status: DC

## 2016-01-04 MED ORDER — METOPROLOL TARTRATE 100 MG PO TABS
100.0000 mg | ORAL_TABLET | Freq: Two times a day (BID) | ORAL | Status: DC
Start: 2016-01-04 — End: 2016-08-28

## 2016-01-04 NOTE — Telephone Encounter (Signed)
done

## 2016-01-13 ENCOUNTER — Other Ambulatory Visit: Payer: Self-pay | Admitting: Family Medicine

## 2016-01-13 DIAGNOSIS — I10 Essential (primary) hypertension: Secondary | ICD-10-CM

## 2016-01-14 MED ORDER — LISINOPRIL 40 MG PO TABS: 40.0000 mg | ORAL_TABLET | Freq: Every day | ORAL | Status: DC

## 2016-01-14 NOTE — Telephone Encounter (Signed)
done

## 2016-02-10 ENCOUNTER — Other Ambulatory Visit: Payer: Self-pay | Admitting: Physician Assistant

## 2016-02-25 ENCOUNTER — Other Ambulatory Visit: Payer: Self-pay | Admitting: Family

## 2016-03-20 ENCOUNTER — Telehealth: Payer: Self-pay | Admitting: Family Medicine

## 2016-03-20 MED ORDER — NAPROXEN 500 MG PO TABS: 500.0000 mg | ORAL_TABLET | Freq: Every day | ORAL | Status: DC | PRN

## 2016-03-20 NOTE — Telephone Encounter (Signed)
Aware, script sent in. 

## 2016-03-20 NOTE — Telephone Encounter (Signed)
Naproxen refilled.   Murtis SinkSam Kaziyah Parkison, MD [facility] 03/20/2016, 12:12 PM

## 2016-03-31 ENCOUNTER — Ambulatory Visit: Payer: BLUE CROSS/BLUE SHIELD | Admitting: Family Medicine

## 2016-04-03 ENCOUNTER — Encounter: Payer: Self-pay | Admitting: Family Medicine

## 2016-04-04 ENCOUNTER — Encounter: Payer: Self-pay | Admitting: Family

## 2016-04-04 ENCOUNTER — Ambulatory Visit (INDEPENDENT_AMBULATORY_CARE_PROVIDER_SITE_OTHER): Payer: BLUE CROSS/BLUE SHIELD | Admitting: Family

## 2016-04-04 VITALS — BP 120/71 | HR 63 | Temp 97.9°F | Ht 70.0 in | Wt >= 6400 oz

## 2016-04-04 DIAGNOSIS — J209 Acute bronchitis, unspecified: Secondary | ICD-10-CM | POA: Diagnosis not present

## 2016-04-04 MED ORDER — AZITHROMYCIN 250 MG PO TABS: ORAL_TABLET | ORAL | Status: DC

## 2016-04-04 MED ORDER — BENZONATATE 200 MG PO CAPS: 200.0000 mg | ORAL_CAPSULE | Freq: Three times a day (TID) | ORAL | Status: DC | PRN

## 2016-04-04 MED ORDER — PREDNISONE 10 MG (21) PO TBPK: 10.0000 mg | ORAL_TABLET | Freq: Every day | ORAL | Status: DC

## 2016-04-04 MED ORDER — HYDROCODONE-HOMATROPINE 5-1.5 MG/5ML PO SYRP: 5.0000 mL | ORAL_SOLUTION | Freq: Three times a day (TID) | ORAL | Status: DC | PRN

## 2016-04-04 NOTE — Progress Notes (Signed)
Subjective:    Patient ID: Jason Meyer, male    DOB: [birth year], 55 y.o.   MRN: [id number]  Cough This is a new problem. The current episode started in the past 7 days. The problem has been rapidly worsening. The problem occurs every few minutes. The cough is productive of sputum and productive of brown sputum. Associated symptoms include myalgias, nasal congestion, postnasal drip, rhinorrhea and wheezing. Pertinent negatives include no chills, ear congestion, ear pain, fever, sore throat or shortness of breath. The symptoms are aggravated by lying down. He has tried OTC cough suppressant and rest for the symptoms. The treatment provided mild relief. There is no history of asthma or COPD.      Review of Systems  Constitutional: Negative.  Negative for fever and chills.  HENT: Positive for postnasal drip and rhinorrhea. Negative for ear pain and sore throat.   Respiratory: Positive for cough and wheezing. Negative for shortness of breath.   Cardiovascular: Negative.   Gastrointestinal: Negative.   Endocrine: Negative.   Genitourinary: Negative.   Musculoskeletal: Positive for myalgias.  Neurological: Negative.   Hematological: Negative.   Psychiatric/Behavioral: Negative.   All other systems reviewed and are negative.      Objective:   Physical Exam  Constitutional: He is oriented to person, place, and time. He appears well-developed and well-nourished. No distress.  HENT:  Head: Normocephalic.  Right Ear: External ear normal.  Left Ear: External ear normal.  Nasal passage erythemas with mild swelling  Oropharynx erythemas   Eyes: Pupils are equal, round, and reactive to light. Right eye exhibits no discharge. Left eye exhibits no discharge.  Neck: Normal range of motion. Neck supple. No thyromegaly present.  Cardiovascular: Normal rate, regular rhythm, normal heart sounds and intact distal pulses.   No murmur heard. Pulmonary/Chest: Effort normal. No respiratory  distress. He has wheezes.  Abdominal: Soft. Bowel sounds are normal. He exhibits no distension. There is no tenderness.  Musculoskeletal: Normal range of motion. He exhibits no edema or tenderness.  Neurological: He is alert and oriented to person, place, and time.  Skin: Skin is warm and dry. No rash noted. No erythema.  Psychiatric: He has a normal mood and affect. His behavior is normal. Judgment and thought content normal.  Vitals reviewed.     BP 120/71 mmHg  Pulse 63  Temp(Src) 97.9 F (36.6 C) (Oral)  Ht  (1.778 m)  Wt 424 lb (192.325 kg)  BMI 60.84 kg/m2     Assessment & Plan:  1. Acute bronchitis, unspecified organism -- Take meds as prescribed - Use a cool mist humidifier  -Use saline nose sprays frequently -Saline irrigations of the nose can be very helpful if done frequently.  * 4X daily for 1 week*  * Use of a nettie pot can be helpful with this. Follow directions with this* -Force fluids -For any cough or congestion  Use plain Mucinex- regular strength or max strength is fine   * Children- consult with Pharmacist for dosing -For fever or aces or pains- take tylenol or ibuprofen appropriate for age and weight.  * for fevers greater than 101 orally you may alternate ibuprofen and tylenol every  3 hours. -Throat lozenges if help - azithromycin (ZITHROMAX Z-PAK) 250 MG tablet; As directed  Dispense: 1 each; Refill: 0 - predniSONE (STERAPRED UNI-PAK 21 TAB) 10 MG (21) TBPK tablet; Take 1 tablet (10 mg total) by mouth daily. As directed x 6 days  Dispense: 21 tablet; Refill: 0 -  HYDROcodone-homatropine (HYCODAN) 5-1.5 MG/5ML syrup; Take 5 mLs by mouth every 8 (eight) hours as needed for cough.  Dispense: 120 mL; Refill: 0 - benzonatate (TESSALON) 200 MG capsule; Take 1 capsule (200 mg total) by mouth 3 (three) times daily as needed.  Dispense: 30 capsule; Refill: 1  Jannifer Rodneyhristy Hawks, FNP

## 2016-04-04 NOTE — Patient Instructions (Signed)
Acute Bronchitis Bronchitis is inflammation of the airways that extend from the windpipe into the lungs (bronchi). The inflammation often causes mucus to develop. This leads to a cough, which is the most common symptom of bronchitis.  In acute bronchitis, the condition usually develops suddenly and goes away over time, usually in a couple weeks. Smoking, allergies, and asthma can make bronchitis worse. Repeated episodes of bronchitis may cause further lung problems.  CAUSES Acute bronchitis is most often caused by the same virus that causes a cold. The virus can spread from person to person (contagious) through coughing, sneezing, and touching contaminated objects. SIGNS AND SYMPTOMS   Cough.   Fever.   Coughing up mucus.   Body aches.   Chest congestion.   Chills.   Shortness of breath.   Sore throat.  DIAGNOSIS  Acute bronchitis is usually diagnosed through a physical exam. Your health care provider will also ask you questions about your medical history. Tests, such as chest X-rays, are sometimes done to rule out other conditions.  TREATMENT  Acute bronchitis usually goes away in a couple weeks. Oftentimes, no medical treatment is necessary. Medicines are sometimes given for relief of fever or cough. Antibiotic medicines are usually not needed but may be prescribed in certain situations. In some cases, an inhaler may be recommended to help reduce shortness of breath and control the cough. A cool mist vaporizer may also be used to help thin bronchial secretions and make it easier to clear the chest.  HOME CARE INSTRUCTIONS  Get plenty of rest.   Drink enough fluids to keep your urine clear or pale yellow (unless you have a medical condition that requires fluid restriction). Increasing fluids may help thin your respiratory secretions (sputum) and reduce chest congestion, and it will prevent dehydration.   Take medicines only as directed by your health care provider.  If  you were prescribed an antibiotic medicine, finish it all even if you start to feel better.  Avoid smoking and secondhand smoke. Exposure to cigarette smoke or irritating chemicals will make bronchitis worse. If you are a smoker, consider using nicotine gum or skin patches to help control withdrawal symptoms. Quitting smoking will help your lungs heal faster.   Reduce the chances of another bout of acute bronchitis by washing your hands frequently, avoiding people with cold symptoms, and trying not to touch your hands to your mouth, nose, or eyes.   Keep all follow-up visits as directed by your health care provider.  SEEK MEDICAL CARE IF: Your symptoms do not improve after 1 week of treatment.  SEEK IMMEDIATE MEDICAL CARE IF:  You develop an increased fever or chills.   You have chest pain.   You have severe shortness of breath.  You have bloody sputum.   You develop dehydration.  You faint or repeatedly feel like you are going to pass out.  You develop repeated vomiting.  You develop a severe headache. MAKE SURE YOU:   Understand these instructions.  Will watch your condition.  Will get help right away if you are not doing well or get worse.   This information is not intended to replace advice given to you by your health care provider. Make sure you discuss any questions you have with your health care provider.   Document Released: 01/04/2005 Document Revised: 12/18/2014 Document Reviewed: 05/20/2013 Elsevier Interactive Patient Education 2016 [employer]  - Take meds as prescribed - Use a cool mist humidifier  -Use saline nose sprays frequently -Saline   irrigations of the nose can be very helpful if done frequently.  * 4X daily for 1 week*  * Use of a nettie pot can be helpful with this. Follow directions with this* -Force fluids -For any cough or congestion  Use plain Mucinex- regular strength or max strength is fine   * Children- consult with Pharmacist for  dosing -For fever or aces or pains- take tylenol or ibuprofen appropriate for age and weight.  * for fevers greater than 101 orally you may alternate ibuprofen and tylenol every  3 hours. -Throat lozenges if help    Christy Hawks, FNP  

## 2016-04-07 ENCOUNTER — Ambulatory Visit: Payer: BLUE CROSS/BLUE SHIELD | Admitting: Family Medicine

## 2016-04-21 ENCOUNTER — Ambulatory Visit: Payer: BLUE CROSS/BLUE SHIELD | Admitting: Family Medicine

## 2016-05-12 ENCOUNTER — Encounter: Payer: Self-pay | Admitting: Family Medicine

## 2016-05-12 ENCOUNTER — Ambulatory Visit (INDEPENDENT_AMBULATORY_CARE_PROVIDER_SITE_OTHER): Payer: BLUE CROSS/BLUE SHIELD | Admitting: Family Medicine

## 2016-05-12 VITALS — BP 141/85 | HR 59 | Temp 97.5°F | Ht 70.0 in | Wt >= 6400 oz

## 2016-05-12 DIAGNOSIS — E139 Other specified diabetes mellitus without complications: Secondary | ICD-10-CM | POA: Diagnosis not present

## 2016-05-12 DIAGNOSIS — I10 Essential (primary) hypertension: Secondary | ICD-10-CM

## 2016-05-12 DIAGNOSIS — I878 Other specified disorders of veins: Secondary | ICD-10-CM

## 2016-05-12 LAB — BAYER DCA HB A1C WAIVED: HB A1C (BAYER DCA - WAIVED): 9.4 % — ABNORMAL HIGH (ref <7.0)

## 2016-05-12 MED ORDER — TRIAMCINOLONE ACETONIDE 0.5 % EX OINT: 1.0000 "application " | TOPICAL_OINTMENT | Freq: Two times a day (BID) | CUTANEOUS | Status: DC

## 2016-05-12 MED ORDER — CANAGLIFLOZIN 100 MG PO TABS: 100.0000 mg | ORAL_TABLET | Freq: Every day | ORAL | Status: DC

## 2016-05-12 NOTE — Patient Instructions (Signed)
Great to see you!  Your diabetic control is slipping, I have discussed a new medicine, 1 pill once a day  Come back to see Jason Meyer or Jason Meyer (our clinical pharmacists) in 1 month for discussion about diabetes  Triamcinolone ointment is for your legs  Diet Recommendations for Diabetes   Starchy (carb) foods include: Bread, rice, pasta, potatoes, corn, crackers, bagels, muffins, all baked goods.   Protein foods include: Meat, fish, poultry, eggs, dairy foods, and beans such as pinto and kidney beans (beans also provide carbohydrate).   1. Eat at least 3 meals and 1-2 snacks per day. Never go more than 4-5 hours while awake without eating.  2. Limit starchy foods to TWO per meal and ONE per snack. ONE portion of a starchy  food is equal to the following:   - ONE slice of bread (or its equivalent, such as half of a hamburger bun).   - 1/2 cup of a "scoopable" starchy food such as potatoes or rice.   - 1 OUNCE (28 grams) of starchy snack foods such as crackers or pretzels (look on label).   - 15 grams of carbohydrate as shown on food label.  3. Both lunch and dinner should include a protein food, a carb food, and vegetables.   - Obtain twice as many veg's as protein or carbohydrate foods for both lunch and dinner.   - Try to keep frozen veg's on hand for a quick vegetable serving.     - Fresh or frozen veg's are best.  4. Breakfast should always include protein.

## 2016-05-12 NOTE — Progress Notes (Signed)
   HPI  Patient presents today here for follow-up.  Diabetes Improve diet quite a bit, taking metformin daily No complaints about metformin Not checking blood sugars, he is not ready to start. He is not very active, has back pain and leg pain with walking but understands that as he starts walking he'll lose weight and this will get better.  Hypertension No chest pain, dyspnea, palpitations.  Leg edema, venous stasis, venous stasis dermatitis Has some mild weeping intermittently in his legs Has dry and scaly skin on bilateral lower extremities The swelling does get better with compression stockings which he does not mind wearing.   PMH: Smoking status noted ROS: Per HPI  Objective: BP 141/85 mmHg  Pulse 59  Temp(Src) 97.5 F (36.4 C) (Oral)  Ht 5\' 10"  (1.778 m)  Wt 427 lb (193.686 kg)  BMI 61.27 kg/m2 Gen: NAD, alert, cooperative with exam HEENT: NCAT CV: RRR, good S1/S2, no murmur Resp: CTABL, no wheezes, non-labored Ext: Large nonpitting edema, erythema and scaling of the bilateral lower extremities up to the knee, no weeping today Neuro: Alert and oriented, No gross deficits  Diabetic Foot Exam - Simple   Simple Foot Form  Visual Inspection  See comments:  Yes  Sensation Testing  Intact to touch and monofilament testing bilaterally:  Yes  Pulse Check  Posterior Tibialis and Dorsalis pulse intact bilaterally:  Yes  Comments  Bilateral posterior heels with hyperkeratotic calluses, no erythema or tenderness to palpation       Assessment and plan:  # Type II diabetes Control worsening A1c 9.4 Continue metformin, start invokana He's resistant to starting to check his blood sugar I do not think he'll have hyperglycemia considering his A1c and small amount of medication Recommend follow-up with clinical pharmacist in one month for diabetic education and teaching about CBG checks  # Venous stasis, venous stasis dermatitis Kenalog ointment Consider Unna boots  if worsens Recommended daily use of compression stockings  # Hypertension Borderline No new medications, continue lisinopril, HCTZ The diuretic effect of Invokana will be likely helpful   Orders Placed This Encounter  Procedures  . Bayer DCA Hb A1c Waived  . Microalbumin / creatinine urine ratio    Meds ordered this encounter  Medications  . triamcinolone ointment (KENALOG) 0.5 %    Sig: Apply 1 application topically 2 (two) times daily.    Dispense:  30 g    Refill:  0  . canagliflozin (INVOKANA) 100 MG TABS tablet    Sig: Take 1 tablet (100 mg total) by mouth daily before breakfast.    Dispense:  30 tablet    Refill:  3    Murtis SinkSam Dianne Bady, MD [facility] 05/12/2016, 10:06 AM

## 2016-05-13 LAB — MICROALBUMIN / CREATININE URINE RATIO
Creatinine, Urine: 190.2 mg/dL
MICROALB/CREAT RATIO: 8 mg/g creat (ref 0.0–30.0)
Microalbumin, Urine: 15.2 ug/mL

## 2016-05-15 ENCOUNTER — Ambulatory Visit: Payer: BLUE CROSS/BLUE SHIELD | Admitting: Pharmacist

## 2016-05-16 ENCOUNTER — Encounter: Payer: Self-pay | Admitting: Family Medicine

## 2016-06-01 ENCOUNTER — Other Ambulatory Visit: Payer: Self-pay | Admitting: Family Medicine

## 2016-06-06 ENCOUNTER — Other Ambulatory Visit: Payer: Self-pay | Admitting: Family Medicine

## 2016-07-01 ENCOUNTER — Other Ambulatory Visit: Payer: Self-pay | Admitting: Family Medicine

## 2016-07-10 ENCOUNTER — Other Ambulatory Visit: Payer: Self-pay | Admitting: Family Medicine

## 2016-07-10 DIAGNOSIS — I10 Essential (primary) hypertension: Secondary | ICD-10-CM

## 2016-07-27 ENCOUNTER — Other Ambulatory Visit: Payer: Self-pay | Admitting: Family Medicine

## 2016-07-27 DIAGNOSIS — I10 Essential (primary) hypertension: Secondary | ICD-10-CM

## 2016-08-28 ENCOUNTER — Other Ambulatory Visit: Payer: Self-pay | Admitting: Family Medicine

## 2016-08-28 DIAGNOSIS — I10 Essential (primary) hypertension: Secondary | ICD-10-CM

## 2016-09-06 ENCOUNTER — Other Ambulatory Visit: Payer: Self-pay | Admitting: Family Medicine

## 2016-09-13 ENCOUNTER — Encounter: Payer: Self-pay | Admitting: Pediatrics

## 2016-09-13 ENCOUNTER — Ambulatory Visit (INDEPENDENT_AMBULATORY_CARE_PROVIDER_SITE_OTHER): Payer: BLUE CROSS/BLUE SHIELD | Admitting: Pediatrics

## 2016-09-13 VITALS — BP 193/102 | HR 59 | Temp 98.1°F | Ht 70.0 in | Wt >= 6400 oz

## 2016-09-13 DIAGNOSIS — E139 Other specified diabetes mellitus without complications: Secondary | ICD-10-CM

## 2016-09-13 DIAGNOSIS — M62838 Other muscle spasm: Secondary | ICD-10-CM

## 2016-09-13 DIAGNOSIS — I10 Essential (primary) hypertension: Secondary | ICD-10-CM | POA: Diagnosis not present

## 2016-09-13 DIAGNOSIS — E109 Type 1 diabetes mellitus without complications: Secondary | ICD-10-CM | POA: Diagnosis not present

## 2016-09-13 MED ORDER — CYCLOBENZAPRINE HCL 10 MG PO TABS: 10.0000 mg | ORAL_TABLET | Freq: Three times a day (TID) | ORAL | 0 refills | Status: DC | PRN

## 2016-09-13 NOTE — Progress Notes (Signed)
  Subjective:   Patient ID: Jason Meyer, male    DOB: [birth year], 55 y.o.   MRN: [id number] CC: Abdominal Pain (lower right)  HPI: Jason Jason Meyer is a 55 y.o. male presenting for Abdominal Pain (lower right)  Taking BP meds regularly though missed a couple of days of lisinopril since he ran out Going to pick up med after this visit No headaches or chest  Pain started in side yesterday evening Leaned over to R side to wipe on toilet last night, felt spasm of pain R side Now anytime he leans over to R side in similar way has pain No pain with twisting Normal appetite No pain  DM2: was not able to pick up invokana, too expensive over $100  Relevant past medical, surgical, family and social history reviewed. Allergies and medications reviewed and updated. History  Smoking Status  . Never Smoker  Smokeless Tobacco  . Never Used   ROS: Per HPI   Objective:    BP (!) 193/102   Pulse (!) 59   Temp 98.1 F (36.7 C) (Oral)   Ht 5\' 10"  (1.778 m)   Wt (!) 439 lb (199.1 kg)   BMI 62.99 kg/m   Wt Readings from Last 3 Encounters:  09/13/16 (!) 439 lb (199.1 kg)  05/12/16 (!) 427 lb (193.7 kg)  04/04/16 (!) 424 lb (192.3 kg)    Gen: NAD, alert, cooperative with exam, NCAT EYES: EOMI, no conjunctival injection, or no icterus CV: NRRR, normal S1/S2, no murmur, distal pulses 2+ b/l Resp: CTABL, no wheezes, normal WOB Abd: +BS, soft, NTND. no guarding or organomegaly Ext: 1+ pitting edema b/l LE.  Neuro: Alert and oriented, strength equal b/l UE and LE MSK: no point tenderness over spine or paraspinous muscles. TTP over soft tissue mid-axillary line halfway down ribcage. No tenderness over rib cage  Assessment & Plan:  Jason Meyer was seen today for abdominal pain.  Diagnoses and all orders for this visit:  Muscle spasm TTP over latisimus dorsi muscles R side Rest, gentle stretching, muscle relaxer prn, discussed not before driving -     cyclobenzaprine (FLEXERIL) 10 MG  tablet; Take 1 tablet (10 mg total) by mouth 3 (three) times daily as needed for muscle spasms.  Essential hypertension Very high today [name] to get here, worried about being late Missed multiple days of lisinopril, taking other meds daily  Going to pick up lisinopril after this appt No HA, no CP, feeling normal self Most recent BPs at office visits have been in 130s-140s Pt to restart all BP meds, RTC for recheck within week as part of DM2 visit with PCP  Diabetes 1.5, managed as type 2 (HCC) Taking metformin alone Not able to afford invokana Has f/u scheduled  Follow up plan: Return for soon as able with Dr Ermalinda MemosBradshaw [facility] follow up. and BP Jason Krasarol Vincent, MD [facility]

## 2016-10-05 ENCOUNTER — Ambulatory Visit: Payer: BLUE CROSS/BLUE SHIELD | Admitting: Family Medicine

## 2016-10-06 ENCOUNTER — Encounter: Payer: Self-pay | Admitting: Family Medicine

## 2016-10-21 ENCOUNTER — Other Ambulatory Visit: Payer: Self-pay | Admitting: Family Medicine

## 2016-11-13 ENCOUNTER — Other Ambulatory Visit: Payer: Self-pay | Admitting: Family Medicine

## 2016-11-13 MED ORDER — METFORMIN HCL 1000 MG PO TABS: 1000.0000 mg | ORAL_TABLET | Freq: Two times a day (BID) | ORAL | 0 refills | Status: DC

## 2016-11-13 NOTE — Telephone Encounter (Signed)
Is this okay to refill or does patient need to come in for appointment? Please advise and send to the pools.

## 2016-11-13 NOTE — Telephone Encounter (Signed)
Rx called in   Murtis SinkSam [name], MD [facility] 11/13/2016, 5:39 PM

## 2016-11-14 NOTE — Telephone Encounter (Signed)
Patient aware.

## 2016-11-24 ENCOUNTER — Other Ambulatory Visit: Payer: Self-pay | Admitting: Family Medicine

## 2016-11-24 DIAGNOSIS — I10 Essential (primary) hypertension: Secondary | ICD-10-CM

## 2016-12-09 ENCOUNTER — Other Ambulatory Visit: Payer: Self-pay | Admitting: Family Medicine

## 2016-12-09 DIAGNOSIS — I10 Essential (primary) hypertension: Secondary | ICD-10-CM

## 2016-12-21 ENCOUNTER — Other Ambulatory Visit: Payer: Self-pay | Admitting: Family Medicine

## 2016-12-21 DIAGNOSIS — I10 Essential (primary) hypertension: Secondary | ICD-10-CM

## 2017-01-19 ENCOUNTER — Other Ambulatory Visit: Payer: Self-pay | Admitting: Family Medicine

## 2017-01-19 DIAGNOSIS — I10 Essential (primary) hypertension: Secondary | ICD-10-CM

## 2017-02-09 ENCOUNTER — Other Ambulatory Visit: Payer: Self-pay | Admitting: Family Medicine

## 2017-02-18 ENCOUNTER — Other Ambulatory Visit: Payer: Self-pay | Admitting: Family Medicine

## 2017-02-26 ENCOUNTER — Ambulatory Visit: Payer: BLUE CROSS/BLUE SHIELD | Admitting: Family Medicine

## 2017-03-16 ENCOUNTER — Other Ambulatory Visit: Payer: Self-pay | Admitting: Family Medicine

## 2017-03-16 DIAGNOSIS — I10 Essential (primary) hypertension: Secondary | ICD-10-CM

## 2017-03-18 ENCOUNTER — Other Ambulatory Visit: Payer: Self-pay | Admitting: Family Medicine

## 2017-03-18 DIAGNOSIS — I10 Essential (primary) hypertension: Secondary | ICD-10-CM

## 2017-03-24 ENCOUNTER — Other Ambulatory Visit: Payer: Self-pay | Admitting: Family Medicine

## 2017-03-24 DIAGNOSIS — I10 Essential (primary) hypertension: Secondary | ICD-10-CM

## 2017-04-02 ENCOUNTER — Encounter: Payer: Self-pay | Admitting: Family Medicine

## 2017-04-02 ENCOUNTER — Ambulatory Visit (INDEPENDENT_AMBULATORY_CARE_PROVIDER_SITE_OTHER): Payer: BLUE CROSS/BLUE SHIELD | Admitting: Family Medicine

## 2017-04-02 VITALS — BP 167/91 | HR 65 | Temp 99.8°F | Ht 70.0 in | Wt >= 6400 oz

## 2017-04-02 DIAGNOSIS — J441 Chronic obstructive pulmonary disease with (acute) exacerbation: Secondary | ICD-10-CM | POA: Diagnosis not present

## 2017-04-02 MED ORDER — FLUTICASONE PROPIONATE 50 MCG/ACT NA SUSP: 2.0000 | Freq: Every day | NASAL | 6 refills | Status: DC

## 2017-04-02 MED ORDER — ATORVASTATIN CALCIUM 80 MG PO TABS: 80.0000 mg | ORAL_TABLET | Freq: Every day | ORAL | 3 refills | Status: DC

## 2017-04-02 MED ORDER — HYDROCHLOROTHIAZIDE 25 MG PO TABS: 25.0000 mg | ORAL_TABLET | Freq: Every day | ORAL | 1 refills | Status: DC

## 2017-04-02 MED ORDER — PREDNISONE 20 MG PO TABS: ORAL_TABLET | ORAL | 0 refills | Status: DC

## 2017-04-02 MED ORDER — DOXYCYCLINE HYCLATE 100 MG PO TABS: 100.0000 mg | ORAL_TABLET | Freq: Two times a day (BID) | ORAL | 0 refills | Status: DC

## 2017-04-02 NOTE — Progress Notes (Signed)
BP (!) 167/91   Pulse 65   Temp 99.8 F (37.7 C) (Oral)    Subjective:    Patient ID: Jason Meyer, male    DOB: [birth year], 56 y.o.   MRN: [id number]  HPI: Jason Meyer is a 56 y.o. male presenting on 04/02/2017 for Allergies (since Friday)   Shortness of Breath  This is a new problem. Episode onset: 3 days. The problem occurs constantly. The problem has been unchanged. Associated symptoms include rhinorrhea and wheezing. Patient states that he has allergies every year but does not take anything for daily for allergies. Patient states chest congestion and cough began 3 days ago and are worse at night. Pertinent negatives include no fever or hemoptysis. Exacerbated by: walking. Associated symptoms comments: Cough, dyspnea on exertion, rhinorrhea. Treatments tried: Flonase. The treatment provided mild relief. His past medical history is significant for allergies. Patient has a 20 year smoking history and quit smoking 20 years ago.   Right Sided Chest Pain Patient states he pulled a muscle on the right side of his chest today. Pain is mid-axillary on the right side just above the diaphragm. Patient states that area is tender and says that it began after helping person into bus. States that pain is worsened by his coughing. Patient denies pain radiating, denies numbness, tingling, chest heaviness. Patient states pain is worse with movements of his right arm.  Relevant past medical, surgical, family and social history reviewed and updated as indicated. Interim medical history since our last visit reviewed. Allergies and medications reviewed and updated.  Review of Systems  Constitutional: Positive for fatigue. Negative for chills, diaphoresis and fever.  HENT: Positive for rhinorrhea and sinus pressure. Negative for ear pain, sore throat and trouble swallowing.   Eyes: Negative.   Respiratory: Positive for cough, shortness of breath and wheezing.   Cardiovascular: Negative for  palpitations.       Right side chest pain worse with breathing and movement of right arm.  Gastrointestinal: Negative.  Negative for blood in stool, constipation, diarrhea, nausea and vomiting.  Endocrine: Negative.   Genitourinary: Negative.   Musculoskeletal: Negative.   Skin: Negative.   Allergic/Immunologic: Positive for environmental allergies.  Neurological: Negative.  Negative for dizziness, weakness, light-headedness, numbness and headaches.    Per HPI unless specifically indicated above      Objective:    BP (!) 167/91   Pulse 65   Temp 99.8 F (37.7 C) (Oral)   Wt Readings from Last 3 Encounters:  09/13/16 (!) 439 lb (199.1 kg)  05/12/16 (!) 427 lb (193.7 kg)  04/04/16 (!) 424 lb (192.3 kg)    Physical Exam  Constitutional: He is oriented to person, place, and time. He appears well-developed and well-nourished. No distress.  HENT:  Head: Normocephalic.  Right Ear: External ear normal.  Left Ear: External ear normal.  Eyes: Conjunctivae and EOM are normal. Pupils are equal, round, and reactive to light.  Neck: Normal range of motion. Neck supple.  Cardiovascular: Normal rate, regular rhythm, normal heart sounds and intact distal pulses.  Exam reveals no gallop and no friction rub.   No murmur heard. Pulmonary/Chest: Effort normal. No respiratory distress. He has wheezes.  Decreased breath sounds to lung bases bilaterally. Bilateral expiratory wheezing present.  Abdominal: Soft. There is no tenderness. There is no guarding.  Musculoskeletal:  Tenderness to right chest wall with palpation.  Lymphadenopathy:    He has no cervical adenopathy.  Neurological: He is alert and oriented to person, place,  and time. No cranial nerve deficit.  Skin: Skin is warm and dry.  Psychiatric: He has a normal mood and affect. His behavior is normal.      Assessment & Plan:   Problem List Items Addressed This Visit    None    Visit Diagnoses    COPD exacerbation (HCC)    -   Primary   Relevant Medications   fluticasone (FLONASE) 50 MCG/ACT nasal spray   predniSONE (DELTASONE) 20 MG tablet   doxycycline (VIBRA-TABS) 100 MG tablet     Patient seen and examined with Harlene Salts PA student, agree with assessment and plan above, instructed patient that if anything gets worse at all the need to go to the emergency department. Patient was resistant towards getting a shot of prednisone to give him a boost here in the office today. Arville Care, MD [facility] 04/05/2017, 10:45 AM      Follow up plan: Return if symptoms worsen or fail to improve.  Counseling provided for all of the vaccine components No orders of the defined types were placed in this encounter.

## 2017-04-03 ENCOUNTER — Other Ambulatory Visit: Payer: Self-pay | Admitting: *Deleted

## 2017-04-03 MED ORDER — ALBUTEROL SULFATE (2.5 MG/3ML) 0.083% IN NEBU: 2.5000 mg | INHALATION_SOLUTION | RESPIRATORY_TRACT | 2 refills | Status: DC | PRN

## 2017-04-03 NOTE — Progress Notes (Signed)
Pt seen on 04/02/2017 for COPD exacerbation Requested refill on neb solution Okayed per Dr Dettinger RX sent into [facility] per pt request

## 2017-04-24 ENCOUNTER — Other Ambulatory Visit: Payer: Self-pay | Admitting: Family Medicine

## 2017-04-24 DIAGNOSIS — I10 Essential (primary) hypertension: Secondary | ICD-10-CM

## 2017-06-08 ENCOUNTER — Other Ambulatory Visit: Payer: Self-pay | Admitting: Family Medicine

## 2017-06-08 ENCOUNTER — Other Ambulatory Visit: Payer: Self-pay | Admitting: *Deleted

## 2017-06-08 DIAGNOSIS — E139 Other specified diabetes mellitus without complications: Secondary | ICD-10-CM

## 2017-06-08 MED ORDER — METFORMIN HCL 1000 MG PO TABS: 1000.0000 mg | ORAL_TABLET | Freq: Two times a day (BID) | ORAL | 0 refills | Status: DC

## 2017-06-08 NOTE — Progress Notes (Signed)
Sent in Metformin RX x 1 mth Will schedule appt

## 2017-06-12 MED ORDER — METFORMIN HCL 1000 MG PO TABS: 1000.0000 mg | ORAL_TABLET | Freq: Two times a day (BID) | ORAL | 2 refills | Status: DC

## 2017-06-12 NOTE — Telephone Encounter (Signed)
Ok with evening appt.   [facility]. Lab orders placed  Murtis SinkSam [name], MD [facility] 06/12/2017, 8:41 AM

## 2017-06-12 NOTE — Telephone Encounter (Signed)
Patient aware. [facility]13 [facility] 5:30 [facility].

## 2017-06-22 ENCOUNTER — Encounter: Payer: Self-pay | Admitting: Family Medicine

## 2017-06-22 ENCOUNTER — Ambulatory Visit (INDEPENDENT_AMBULATORY_CARE_PROVIDER_SITE_OTHER): Payer: BLUE CROSS/BLUE SHIELD | Admitting: Family Medicine

## 2017-06-22 VITALS — BP 127/62 | HR 63 | Temp 97.4°F | Ht 70.0 in | Wt >= 6400 oz

## 2017-06-22 DIAGNOSIS — E119 Type 2 diabetes mellitus without complications: Secondary | ICD-10-CM

## 2017-06-22 LAB — BAYER DCA HB A1C WAIVED: HB A1C (BAYER DCA - WAIVED): 10.5 % — ABNORMAL HIGH (ref <7.0)

## 2017-06-22 MED ORDER — DAPAGLIFLOZIN PROPANEDIOL 10 MG PO TABS: 10.0000 mg | ORAL_TABLET | Freq: Every day | ORAL | 11 refills | Status: DC

## 2017-06-22 NOTE — Progress Notes (Signed)
   HPI  Patient presents today for follow-up type 2 diabetes. Patient has good medication compliance, he does have difficulty affording his medications. He has not been taking amlodipine recently as he has run out.  He denies any chest pain, headache, or worsening leg swelling. He has difficulty tolerating compression stockings as it causes some foot paresthesias which do not occur if he is not wearing them.  Diabetes Not checking blood sugars, moderately/minimally watching diet. Good metformin compliance and tolerance. Does not want to take insulin, does not check his blood sugars due to not liking needles. He is circumcised.  PMH: Smoking status noted ROS: Per HPI  Objective: BP 127/62 (BP Location: Right Arm, Cuff Size: Large)   Pulse 63   Temp (!) 97.4 F (36.3 C) (Oral)   Ht '5\' 10"'$  (1.778 m)   Wt (!) 415 lb (188.2 kg)   BMI 59.55 kg/m  Gen: NAD, alert, cooperative with exam HEENT: NCAT CV: RRR, good S1/S2, no murmur Resp: CTABL, no wheezes, non-labored Ext: No edema, warm Neuro: Alert and oriented, No gross deficits Diabetic Foot Exam - Simple   Simple Foot Form Diabetic Foot exam was performed with the following findings:  Yes 06/22/2017  6:59 PM  Visual Inspection No deformities, no ulcerations, no other skin breakdown bilaterally:  Yes Sensation Testing Intact to touch and monofilament testing bilaterally:  Yes Pulse Check Posterior Tibialis and Dorsalis pulse intact bilaterally:  Yes Comments      Assessment and plan:  # T2DM Uncontrolled, a1c worsening at 10.5 Discussed therapeutic lifestyle chanes Continue metformin, add farxiga Recommended that he actually needs insulin, he will consider but does not like the idea.  RTC in 3 months for follow up.       Orders Placed This Encounter  Procedures  . Bayer DCA Hb A1c Waived  . CMP14+EGFR  . CBC with Differential/Platelet  . LDL Cholesterol, Direct    Meds ordered this encounter  Medications    . dapagliflozin propanediol (FARXIGA) 10 MG TABS tablet    Sig: Take 10 mg by mouth daily.    Dispense:  30 tablet    Refill:  [place], MD [facility] 06/22/2017, 7:03 PM

## 2017-06-28 ENCOUNTER — Other Ambulatory Visit: Payer: Self-pay | Admitting: Family Medicine

## 2017-06-28 ENCOUNTER — Telehealth: Payer: Self-pay

## 2017-06-28 DIAGNOSIS — I10 Essential (primary) hypertension: Secondary | ICD-10-CM

## 2017-06-28 MED ORDER — EMPAGLIFLOZIN 25 MG PO TABS: 25.0000 mg | ORAL_TABLET | Freq: Every day | ORAL | 11 refills | Status: DC

## 2017-06-28 NOTE — Telephone Encounter (Signed)
Pt aware.

## 2017-06-28 NOTE — Telephone Encounter (Signed)
Jardiance sent.   Jason SinkSam Tamarra Geiselman, MD [facility] 06/28/2017, 12:02 PM

## 2017-06-28 NOTE — Telephone Encounter (Signed)
Insurance denied prior [place] for Farxiga   Alternatives are [employer]

## 2017-08-28 ENCOUNTER — Ambulatory Visit (INDEPENDENT_AMBULATORY_CARE_PROVIDER_SITE_OTHER): Payer: BLUE CROSS/BLUE SHIELD

## 2017-08-28 ENCOUNTER — Encounter: Payer: Self-pay | Admitting: Family Medicine

## 2017-08-28 ENCOUNTER — Ambulatory Visit (INDEPENDENT_AMBULATORY_CARE_PROVIDER_SITE_OTHER): Payer: BLUE CROSS/BLUE SHIELD | Admitting: Family Medicine

## 2017-08-28 VITALS — BP 162/94 | HR 59 | Temp 98.6°F | Ht 70.0 in | Wt >= 6400 oz

## 2017-08-28 DIAGNOSIS — G8929 Other chronic pain: Secondary | ICD-10-CM

## 2017-08-28 DIAGNOSIS — M25561 Pain in right knee: Secondary | ICD-10-CM | POA: Diagnosis not present

## 2017-08-28 MED ORDER — NAPROXEN 500 MG PO TABS: ORAL_TABLET | ORAL | 2 refills | Status: DC

## 2017-08-28 MED ORDER — SILDENAFIL CITRATE 20 MG PO TABS: ORAL_TABLET | ORAL | 3 refills | Status: DC

## 2017-08-28 NOTE — Progress Notes (Signed)
   HPI  Patient presents today with right knee pain.  Patient states that this is been going on for several years but slowly getting worse. He has anterior and medial right knee pain after sitting for long periods of time or standing for long periods of time. No injury. 500 mg of naproxen was very helpful, he is now taking 400 mg of ibuprofen twice daily.  He does not want to see an orthopedic surgeon or have injections in the knee.  PMH: Smoking status noted ROS: Per HPI  Objective: BP (!) 162/94   Pulse (!) 59   Temp 98.6 F (37 C) (Oral)   Ht  (1.778 m)   Wt (!) 416 lb 6.4 oz (188.9 kg)   BMI 59.75 kg/m  Gen: NAD, alert, cooperative with exam HEENT: NCAT CV: RRR, good S1/S2, no murmur Resp: CTABL, no wheezes, non-labored Ext: No edema, warm Neuro: Alert and oriented, No gross deficits MSK: R knee without erythema, effusion, bruising, or gross deformity No joint line tenderness.  ligamentously intact to Lachman's and with varus and valgus stress.  Negative McMurray's test  Assessment and plan:  # Right knee pain Likely osteoarthritis Restart naproxen, discussed concerns with long-term treatment with NSAIDs Plain film ordered, pending. Return to clinic in about one month for follow-up diabetes and routine lab work     Orders Placed This Encounter  Procedures  . DG Knee 1-2 Views Right    Standing Status:   Future    Number of Occurrences:   1    Standing Expiration Date:   10/28/2018    Order Specific Question:   Reason for Exam (SYMPTOM  OR DIAGNOSIS REQUIRED)    Answer:   R knee pain, eval for OA    Order Specific Question:   Preferred imaging location?    Answer:   Internal    Meds ordered this encounter  Medications  . naproxen (NAPROSYN) 500 MG tablet    Sig: TAKE 1 TABLET (500 MG TOTAL) BY MOUTH DAILY AS NEEDED FOR MODERATE PAIN.    Dispense:  30 tablet    Refill:  2  . sildenafil (REVATIO) 20 MG tablet    Sig: 2-5 pills once daily as  needed for erectile dysfunction    Dispense:  50 tablet    Refill:  3    Murtis Sink, MD [facility] 08/28/2017, 5:16 PM

## 2017-08-28 NOTE — Patient Instructions (Addendum)
Great to see you!  Come back in about 1 month for follow up diabetes and labs.

## 2017-09-19 ENCOUNTER — Other Ambulatory Visit: Payer: Self-pay | Admitting: Family Medicine

## 2017-10-03 ENCOUNTER — Encounter: Payer: Self-pay | Admitting: Pediatrics

## 2017-10-03 ENCOUNTER — Ambulatory Visit (INDEPENDENT_AMBULATORY_CARE_PROVIDER_SITE_OTHER): Payer: BLUE CROSS/BLUE SHIELD | Admitting: Pediatrics

## 2017-10-03 VITALS — BP 133/90 | HR 83 | Temp 97.8°F | Ht 70.0 in | Wt 395.4 lb

## 2017-10-03 DIAGNOSIS — N3090 Cystitis, unspecified without hematuria: Secondary | ICD-10-CM

## 2017-10-03 DIAGNOSIS — R3915 Urgency of urination: Secondary | ICD-10-CM | POA: Diagnosis not present

## 2017-10-03 LAB — MICROSCOPIC EXAMINATION
Renal Epithel, UA: NONE SEEN /hpf
WBC, UA: >30 /hpf — AB (ref 0–?)

## 2017-10-03 LAB — URINALYSIS, COMPLETE
Bilirubin, UA: NEGATIVE
Glucose, UA: NEGATIVE
Ketones, UA: NEGATIVE
Nitrite, UA: POSITIVE — AB
Specific Gravity, UA: 1.02 (ref 1.005–1.030)
Urobilinogen, Ur: 0.2 mg/dL (ref 0.2–1.0)
pH, UA: 5 (ref 5.0–7.5)

## 2017-10-03 MED ORDER — SULFAMETHOXAZOLE-TRIMETHOPRIM 800-160 MG PO TABS: 1.0000 | ORAL_TABLET | Freq: Two times a day (BID) | ORAL | 0 refills | Status: DC

## 2017-10-03 NOTE — Progress Notes (Signed)
  Subjective:   Patient ID: Jason Meyer, male    DOB: Mar 30, 1961, 56 y.o.   MRN: [id number] CC: Urinary Frequency and Fever  HPI: Jason RushingMichael Meyer is a 56 y.o. male presenting for Urinary Frequency and Fever  Subjective chills at home past 4 days No burning with urination +urinary frequency +urinary urgency at night, keeping bucket by bed bc worried he wont make it  Appetite down No back pain No pain with stooling Feels like he is emptying bladder completely No new sexual partners  Overdue for DM2 f/u with PCP On metformin, wasn't able to pick up jardiance from pharmacy, asks for refill now  Relevant past medical, surgical, family and social history reviewed. Allergies and medications reviewed and updated. History  Smoking Status  . Never Smoker  Smokeless Tobacco  . Never Used   ROS: Per HPI   Objective:    BP 133/90   Pulse 83   Temp 97.8 F (36.6 C) (Oral)   Ht 5\' 10"  (1.778 m)   Wt (!) 395 lb 6.4 oz (179.4 kg)   BMI 56.73 kg/m   Wt Readings from Last 3 Encounters:  10/03/17 (!) 395 lb 6.4 oz (179.4 kg)  08/28/17 (!) 416 lb 6.4 oz (188.9 kg)  06/22/17 (!) 415 lb (188.2 kg)    Gen: NAD, alert, cooperative with exam, NCAT EYES: EOMI, no conjunctival injection, or no icterus ENT:   OP without erythema LYMPH: no cervical LAD CV: NRRR, normal S1/S2, no murmur, distal pulses 2+ b/l Resp: CTABL, no wheezes, normal WOB Abd: +BS, soft, NTND. No CVA tenderness Neuro: Alert and oriented  Assessment & Plan:  Jason Meyer was seen today for urinary frequency and fever.  Diagnoses and all orders for this visit:  Urinary urgency -     Urinalysis, Complete -     Urine Culture + UA Treat with below Return precautions discussed  Cystitis -     sulfamethoxazole-trimethoprim (BACTRIM DS,SEPTRA DS) 800-160 MG tablet; Take 1 tablet by mouth 2 (two) times daily.  Other orders -     Microscopic Examination  DM2 Gave samples for jardiance 10mg , hasnt started on  jardiance yet, pt to schedule f/u with PCP  Follow up plan: Return in about 2 weeks (around 10/17/2017) for DM2. with pcp Rex Krasarol Makylah Bossard, MD [facility]

## 2017-10-05 LAB — URINE CULTURE

## 2017-11-22 ENCOUNTER — Other Ambulatory Visit: Payer: Self-pay | Admitting: Family Medicine

## 2017-12-01 ENCOUNTER — Other Ambulatory Visit: Payer: Self-pay | Admitting: Family Medicine

## 2017-12-07 ENCOUNTER — Telehealth: Payer: Self-pay | Admitting: Family Medicine

## 2017-12-07 ENCOUNTER — Emergency Department (HOSPITAL_COMMUNITY)
Admission: EM | Admit: 2017-12-07 | Discharge: 2017-12-07 | Disposition: A | Discharge status: Home / Self Care | Payer: BLUE CROSS/BLUE SHIELD | Attending: Emergency Medicine | Admitting: Emergency Medicine

## 2017-12-07 ENCOUNTER — Other Ambulatory Visit: Payer: Self-pay

## 2017-12-07 ENCOUNTER — Encounter (HOSPITAL_COMMUNITY): Payer: Self-pay | Admitting: Emergency Medicine

## 2017-12-07 DIAGNOSIS — R531 Weakness: Secondary | ICD-10-CM | POA: Diagnosis not present

## 2017-12-07 DIAGNOSIS — N390 Urinary tract infection, site not specified: Secondary | ICD-10-CM | POA: Insufficient documentation

## 2017-12-07 DIAGNOSIS — I10 Essential (primary) hypertension: Secondary | ICD-10-CM | POA: Diagnosis not present

## 2017-12-07 DIAGNOSIS — E119 Type 2 diabetes mellitus without complications: Secondary | ICD-10-CM | POA: Diagnosis not present

## 2017-12-07 DIAGNOSIS — E1165 Type 2 diabetes mellitus with hyperglycemia: Secondary | ICD-10-CM | POA: Diagnosis not present

## 2017-12-07 DIAGNOSIS — R110 Nausea: Secondary | ICD-10-CM | POA: Insufficient documentation

## 2017-12-07 DIAGNOSIS — Z79899 Other long term (current) drug therapy: Secondary | ICD-10-CM | POA: Diagnosis not present

## 2017-12-07 DIAGNOSIS — R509 Fever, unspecified: Secondary | ICD-10-CM | POA: Diagnosis present

## 2017-12-07 DIAGNOSIS — R739 Hyperglycemia, unspecified: Secondary | ICD-10-CM | POA: Diagnosis not present

## 2017-12-07 DIAGNOSIS — Z7984 Long term (current) use of oral hypoglycemic drugs: Secondary | ICD-10-CM | POA: Insufficient documentation

## 2017-12-07 LAB — CBC WITH DIFFERENTIAL/PLATELET
Basophils Absolute: 0 10*3/uL (ref 0.0–0.1)
Basophils Relative: 0 %
Eosinophils Absolute: 0.1 10*3/uL (ref 0.0–0.7)
Eosinophils Relative: 0 %
HCT: 46 % (ref 39.0–52.0)
Hemoglobin: 15.4 g/dL (ref 13.0–17.0)
Lymphocytes Relative: 19 %
Lymphs Abs: 2.4 10*3/uL (ref 0.7–4.0)
MCH: 26.7 pg (ref 26.0–34.0)
MCHC: 33.5 g/dL (ref 30.0–36.0)
MCV: 79.9 fL (ref 78.0–100.0)
Monocytes Absolute: 1 10*3/uL (ref 0.1–1.0)
Monocytes Relative: 8 %
Neutro Abs: 9.2 10*3/uL — ABNORMAL HIGH (ref 1.7–7.7)
Neutrophils Relative %: 73 %
Platelets: 226 10*3/uL (ref 150–400)
RBC: 5.76 MIL/uL (ref 4.22–5.81)
RDW: 13.5 % (ref 11.5–15.5)
WBC: 12.6 10*3/uL — ABNORMAL HIGH (ref 4.0–10.5)

## 2017-12-07 LAB — URINALYSIS, ROUTINE W REFLEX MICROSCOPIC
Bilirubin Urine: NEGATIVE
Glucose, UA: >=500 mg/dL — AB
Ketones, ur: NEGATIVE mg/dL
Nitrite: NEGATIVE
Protein, ur: 30 mg/dL — AB
Specific Gravity, Urine: 1.02 (ref 1.005–1.030)
Squamous Epithelial / LPF: NONE SEEN
pH: 5 (ref 5.0–8.0)

## 2017-12-07 LAB — COMPREHENSIVE METABOLIC PANEL
ALT: 41 U/L (ref 17–63)
AST: 30 U/L (ref 15–41)
Albumin: 3.4 g/dL — ABNORMAL LOW (ref 3.5–5.0)
Alkaline Phosphatase: 72 U/L (ref 38–126)
Anion gap: 13 (ref 5–15)
BUN: 13 mg/dL (ref 6–20)
CO2: 31 mmol/L (ref 22–32)
Calcium: 9.2 mg/dL (ref 8.9–10.3)
Chloride: 93 mmol/L — ABNORMAL LOW (ref 101–111)
Creatinine, Ser: 1.08 mg/dL (ref 0.61–1.24)
GFR calc Af Amer: >60 mL/min (ref >60)
GFR calc non Af Amer: >60 mL/min (ref >60)
Glucose, Bld: 316 mg/dL — ABNORMAL HIGH (ref 65–99)
Potassium: 3.6 mmol/L (ref 3.5–5.1)
Sodium: 137 mmol/L (ref 135–145)
Total Bilirubin: 1.2 mg/dL (ref 0.3–1.2)
Total Protein: 7.6 g/dL (ref 6.5–8.1)

## 2017-12-07 MED ORDER — DOXYCYCLINE HYCLATE 100 MG PO CAPS: 100.0000 mg | ORAL_CAPSULE | Freq: Two times a day (BID) | ORAL | 0 refills | Status: DC

## 2017-12-07 MED ORDER — DOXYCYCLINE HYCLATE 100 MG PO TABS
100.0000 mg | ORAL_TABLET | Freq: Once | ORAL | Status: AC
  Administered 2017-12-07: 100 mg via ORAL
  Filled 2017-12-07: qty 1

## 2017-12-07 NOTE — Telephone Encounter (Signed)
Patient has been sick x 1 week- apt made for 12/08/17 and aware if gets worse go to Urgent Care- verbalizes understanding.

## 2017-12-07 NOTE — Discharge Instructions (Addendum)
Your white blood cell count is slightly elevated.  Your urine test suggest a urinary tract infection.  Your blood sugar is 319.  This is higher than it has been over the past year.  Please monitor your glucose carefully and consistently.  Please [facility] the [facility] [place] concerning your urinary tract infection.  Please use doxycycline 2 times daily with food until all taken.  Please use Tylenol extra strength every 4 hours as needed for temperature elevation.  Please return to the emergency department if any problems with nausea, vomiting, fever that will not respond to Tylenol, or deterioration in your general condition.

## 2017-12-07 NOTE — Telephone Encounter (Signed)
What symptoms do you have? Has been a fever since Sunday night   How long have you been sick? Since Sunday night  Have you been seen for this problem? No,has taken ibuprofen for it and he has not ate much   If your provider decides to give you a prescription, which pharmacy [facility]   Patient informed that this information will be sent to the clinical staff for review and that they should receive a follow up call.

## 2017-12-07 NOTE — ED Notes (Signed)
Call to lab POC Ed Re: urine culture

## 2017-12-07 NOTE — ED Triage Notes (Signed)
Fever, weakness and decreased appetite since Sunday.  Denies any respiratory, gi/gu s/s

## 2017-12-07 NOTE — ED Provider Notes (Signed)
[facility] EMERGENCY DEPARTMENT Provider Note   CSN: [id number] Arrival date & time: 12/07/17  1705     History   Chief Complaint No chief complaint on file.   HPI Jason Meyer is a 56 y.o. male.  Patient is a 56 year old male who presents to the emergency department with a complaint of fever, weakness, and decreased appetite.  The patient states that this problem started 5 days ago.  He states that the first 2 days he had fever and chills and generally not feeling well.  The third day he felt a little better, but was still sick.  He went back to work today, but states he did not feel good all day.  Patient has had some mild nausea, but no actual vomiting.  He has had very minimal congestion in the nasal passages.  No significant cough.  He denies any ulcers or sores that are not healing well.  He denies any hemoptysis.  There is been no blood in the urine or in the stool recently.  It is of note that the patient is diabetic.  Patient states he does not check his blood sugar because he has a fear of needles.      Past Medical History:  Diagnosis Date  . Allergy   . Diabetes mellitus without complication (HCC)   . Hyperlipidemia   . Hypertension     Patient Active Problem List   Diagnosis Date Noted  . OA (osteoarthritis) 11/23/2015  . Hyperkalemia 09/20/2015  . Venous stasis 09/20/2015  . 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 HTN (hypertension) 04/23/2013  . Obesity 04/23/2013  . Hyperlipidemia 04/23/2013    History reviewed. No pertinent surgical history.     Home Medications    Prior to Admission medications   Medication Sig Start Date End Date Taking? Authorizing Provider  albuterol (PROVENTIL) (2.5 MG/3ML) 0.083% nebulizer solution Take 3 mLs (2.5 mg total) by nebulization every 4 (four) hours as needed for wheezing or shortness of breath. 04/03/17   Dettinger, Elige Radon, MD  amLODipine (NORVASC) 10 MG tablet TAKE 1 TABLET (10 MG TOTAL) BY  MOUTH DAILY. 11/24/16   Elenora Gamma, MD  atorvastatin (LIPITOR) 80 MG tablet Take 1 tablet (80 mg total) by mouth daily. 04/02/17   Dettinger, Elige Radon, MD  empagliflozin (JARDIANCE) 25 MG TABS tablet Take 25 mg by mouth daily. 06/28/17   Elenora Gamma, MD  fluticasone (FLONASE) 50 MCG/ACT nasal spray Place 2 sprays into both nostrils daily. 04/02/17   Dettinger, Elige Radon, MD  lisinopril (PRINIVIL,ZESTRIL) 40 MG tablet TAKE 1 TABLET EVERY DAY 04/24/17   Dettinger, Elige Radon, MD  metFORMIN (GLUCOPHAGE) 1000 MG tablet TAKE 1 TABLET (1,000 MG TOTAL) BY MOUTH 2 (TWO) TIMES DAILY WITH A MEAL. 11/22/17   Elenora Gamma, MD  metoprolol tartrate (LOPRESSOR) 100 MG tablet TAKE 1 TABLET (100 MG TOTAL) BY MOUTH 2 (TWO) TIMES DAILY. 06/28/17   Elenora Gamma, MD  naproxen (NAPROSYN) 500 MG tablet TAKE 1 TABLET (500 MG TOTAL) BY MOUTH DAILY AS NEEDED FOR MODERATE PAIN. 12/05/17   Elenora Gamma, MD  sildenafil (REVATIO) 20 MG tablet 2-5 pills once daily as needed for erectile dysfunction 08/28/17   Elenora Gamma, MD  sulfamethoxazole-trimethoprim (BACTRIM DS,SEPTRA DS) 800-160 MG tablet Take 1 tablet by mouth 2 (two) times daily. 10/03/17   Johna Sheriff, MD  triamcinolone ointment (KENALOG) 0.5 % APPLY TOPICALLY TWICE DAILY 12/05/17   Elenora Gamma, MD  Family History Family History  Problem Relation Age of Onset  . Heart attack Brother   . Alzheimer's disease Mother   . Heart attack Father   . Stroke Father     Social History Social History   Tobacco Use  . Smoking status: Never Smoker  . Smokeless tobacco: Never Used  Substance Use Topics  . Alcohol use: No  . Drug use: No     Allergies   Penicillins   Review of Systems Review of Systems  Constitutional: Negative for activity change.       All ROS Neg except as noted in HPI  HENT: Negative for nosebleeds.   Eyes: Negative for photophobia and discharge.  Respiratory: Negative for cough, shortness of  breath and wheezing.   Cardiovascular: Negative for chest pain and palpitations.  Gastrointestinal: Positive for nausea. Negative for abdominal pain and blood in stool.  Genitourinary: Positive for frequency. Negative for dysuria and hematuria.  Musculoskeletal: Positive for arthralgias. Negative for back pain and neck pain.  Skin: Negative.   Neurological: Negative for dizziness, seizures and speech difficulty.  Psychiatric/Behavioral: Negative for confusion and hallucinations.     Physical Exam Updated Vital Signs BP (!) 126/57 (BP Location: Right Arm)   Pulse 85   Temp (!) 97.1 F (36.2 C) (Tympanic)   Resp 18   Ht 5\' 11"  (1.803 m)   Wt (!) 186.9 kg (412 lb)   SpO2 95%   BMI 57.46 kg/m   Physical Exam  Constitutional: He is oriented to person, place, and time. He appears well-developed and well-nourished.  Non-toxic appearance.  HENT:  Head: Normocephalic.  Right Ear: Tympanic membrane and external ear normal.  Left Ear: Tympanic membrane and external ear normal.  Eyes: EOM and lids are normal. Pupils are equal, round, and reactive to light.  Neck: Normal range of motion. Neck supple. Carotid bruit is not present.  Cardiovascular: Normal rate, regular rhythm, normal heart sounds, intact distal pulses and normal pulses.  Pulmonary/Chest: Breath sounds normal. No respiratory distress.  Abdominal: Soft. Bowel sounds are normal. There is no tenderness. There is no guarding.  Musculoskeletal: Normal range of motion.  Lymphadenopathy:       Head (right side): No submandibular adenopathy present.       Head (left side): No submandibular adenopathy present.    He has no cervical adenopathy.  Neurological: He is alert and oriented to person, place, and time. He has normal strength. No cranial nerve deficit or sensory deficit.  Skin: Skin is warm and dry.  Psychiatric: He has a normal mood and affect. His speech is normal.  Nursing note and vitals reviewed.    ED Treatments /  Results  Labs (all labs ordered are listed, but only abnormal results are displayed) Labs Reviewed  COMPREHENSIVE METABOLIC PANEL - Abnormal; Notable for the following components:      Result Value   Chloride 93 (*)    Glucose, Bld 316 (*)    Albumin 3.4 (*)    All other components within normal limits  CBC WITH DIFFERENTIAL/PLATELET - Abnormal; Notable for the following components:   WBC 12.6 (*)    Neutro Abs 9.2 (*)    All other components within normal limits  URINALYSIS, ROUTINE W REFLEX MICROSCOPIC - Abnormal; Notable for the following components:   APPearance HAZY (*)    Glucose, UA >=500 (*)    Hgb urine dipstick SMALL (*)    Protein, ur 30 (*)    Leukocytes, UA MODERATE (*)  Bacteria, UA MANY (*)    All other components within normal limits  URINE CULTURE    EKG  EKG Interpretation None       Radiology No results found.  Procedures Procedures (including critical care time)  Medications Ordered in ED Medications  doxycycline (VIBRA-TABS) tablet 100 mg (not administered)     Initial Impression / Assessment and Plan / ED Course  I have reviewed the triage vital signs and the nursing notes.  Pertinent labs & imaging results that were available during my care of the patient were reviewed by me and considered in my medical decision making (see chart for details).       Final Clinical Impressions(s) / ED Diagnoses MDM  Complete blood count shows an elevation in the white blood cells of 12,600.  The comprehensive metabolic panel shows the glucose to be elevated at 316, otherwise essentially not contributory. Urine analysis shows a hazy yellow specimen with a specific gravity 1.020.  This 500 mg/dL of glucose present in the urine.  There are moderate leukocyte esterase, and too many to count white blood cells.  With many bacteria.  White blood cells are in clumps.  Patient states he is not seen any blood in his urine, but he acknowledges that he has been  going a little more frequently than usual.  The patient's urine will be sent to the lab for culture.  Patient will be started on doxycycline.  Patient states he has not had a formal evaluation of his prostate.  I strongly encouraged the patient to see the physicians at [facility] as soon as possible concerning not only his prostate, but urinary tract infection in a male.  Patient and family are in agreement with the plan to treat the infection and to be seen by urology.  I also discussed with the patient the danger of not keeping close check on the glucose.  Reminded him of his hyperglycemia today.  The patient states that he may not have taken all of his medication this morning and he will take it as soon as he gets home.   Final diagnoses:  Urinary tract infection without hematuria, site unspecified  Hyperglycemia    ED Discharge Orders        Ordered    doxycycline (VIBRAMYCIN) 100 MG capsule  2 times daily     12/07/17 2212       Ivery QualeBryant, Erendira Crabtree, PA-C 12/08/17 [id number]    Eber HongMiller, Brian, MD 12/09/17 1004

## 2017-12-08 ENCOUNTER — Telehealth: Payer: Self-pay | Admitting: Family Medicine

## 2017-12-08 ENCOUNTER — Ambulatory Visit: Payer: Self-pay

## 2017-12-08 NOTE — Telephone Encounter (Signed)
Per ER Dishcarge note pt is to follow up with [facility] pt is aware to contact that office to get a f/u apt scheduled

## 2017-12-11 LAB — URINE CULTURE: Culture: >=100000 — AB

## 2017-12-12 ENCOUNTER — Telehealth: Payer: Self-pay | Admitting: Emergency Medicine

## 2017-12-12 NOTE — Telephone Encounter (Signed)
Post ED Visit - Positive Culture Follow-up  Culture report reviewed by antimicrobial stewardship pharmacist:  [x]  Jason Meyer, Pharm.D. []  Celedonio MiyamotoJeremy Frens, Pharm.D., BCPS AQ-ID []  Garvin FilaMike Maccia, Pharm.D., BCPS []  Georgina PillionElizabeth Martin, [address] BCPS []  GabbsMinh Pham, [address] BCPS, AAHIVP []  Estella HuskMichelle Turner, Pharm.D., BCPS, AAHIVP []  Lysle Pearlachel Rumbarger, PharmD, BCPS []  Blake DivineShannon Parkey, PharmD []  Pollyann SamplesAndy Johnston, PharmD, BCPS  Positive urine culture Treated with doxycycline, organism sensitive to the same and no further patient follow-up is required at this time.  Jason MullMiller, Jason Meyer 12/12/2017, 11:27 AM

## 2017-12-25 ENCOUNTER — Ambulatory Visit: Payer: BLUE CROSS/BLUE SHIELD | Admitting: Family Medicine

## 2017-12-26 ENCOUNTER — Encounter: Payer: Self-pay | Admitting: Family Medicine

## 2018-01-03 ENCOUNTER — Ambulatory Visit: Payer: BLUE CROSS/BLUE SHIELD | Admitting: Family Medicine

## 2018-01-03 ENCOUNTER — Encounter: Payer: Self-pay | Admitting: Family Medicine

## 2018-01-03 VITALS — BP 163/88 | HR 65 | Temp 97.1°F | Ht 71.0 in | Wt >= 6400 oz

## 2018-01-03 DIAGNOSIS — E119 Type 2 diabetes mellitus without complications: Secondary | ICD-10-CM | POA: Diagnosis not present

## 2018-01-03 DIAGNOSIS — M170 Bilateral primary osteoarthritis of knee: Secondary | ICD-10-CM

## 2018-01-03 DIAGNOSIS — I10 Essential (primary) hypertension: Secondary | ICD-10-CM

## 2018-01-03 LAB — BAYER DCA HB A1C WAIVED: HB A1C (BAYER DCA - WAIVED): >14 % — ABNORMAL HIGH (ref <7.0)

## 2018-01-03 MED ORDER — AMLODIPINE BESYLATE 10 MG PO TABS: 10.0000 mg | ORAL_TABLET | Freq: Every day | ORAL | 3 refills | Status: DC

## 2018-01-03 MED ORDER — CANAGLIFLOZIN 300 MG PO TABS: 300.0000 mg | ORAL_TABLET | Freq: Every day | ORAL | 2 refills | Status: DC

## 2018-01-03 MED ORDER — ATORVASTATIN CALCIUM 80 MG PO TABS: 80.0000 mg | ORAL_TABLET | Freq: Every day | ORAL | 3 refills | Status: DC

## 2018-01-03 MED ORDER — METFORMIN HCL 1000 MG PO TABS: 1000.0000 mg | ORAL_TABLET | Freq: Two times a day (BID) | ORAL | 5 refills | Status: DC

## 2018-01-03 MED ORDER — METOPROLOL TARTRATE 100 MG PO TABS: 100.0000 mg | ORAL_TABLET | Freq: Two times a day (BID) | ORAL | 3 refills | Status: DC

## 2018-01-03 MED ORDER — CANAGLIFLOZIN 300 MG PO TABS: 300.0000 mg | ORAL_TABLET | Freq: Every day | ORAL | 3 refills | Status: DC

## 2018-01-03 MED ORDER — SAXAGLIPTIN HCL 5 MG PO TABS: 5.0000 mg | ORAL_TABLET | Freq: Every day | ORAL | 2 refills | Status: DC

## 2018-01-03 MED ORDER — NAPROXEN 500 MG PO TABS: 500.0000 mg | ORAL_TABLET | Freq: Two times a day (BID) | ORAL | 2 refills | Status: DC

## 2018-01-03 NOTE — Patient Instructions (Signed)
Great to see you!  Come back in 3 months unless you need us sooner.    

## 2018-01-03 NOTE — Progress Notes (Signed)
   HPI  Patient presents today for follow-up chronic medical conditions.  Diabetes Patient is very resistant to the idea of taking insulin He has not been taking the Jardiance which was recommended. He is taking metformin He is not checking his blood sugars regularly. He is not watching his diet.  Hypertension Good medication compliance, not taking lisinopril he cites that he did not think he was supposed to take it No headache or chest pain   Knee pain, OA Using naproxen for knee pain, it is helping well  PMH: Smoking status noted ROS: Per HPI  Objective: BP (!) 163/88   Pulse 65   Temp (!) 97.1 F (36.2 C) (Oral)   Ht 5\' 11"  (1.803 m)   Wt (!) 404 lb 12.8 oz (183.6 kg)   BMI 56.46 kg/m  Gen: NAD, alert, cooperative with exam HEENT: NCAT CV: RRR, good S1/S2, no murmur Resp: CTABL, no wheezes, non-labored Ext: No edema, warm Neuro: Alert and oriented, No gross deficits  Assessment and plan:  #Type 2 diabetes Very uncontrolled, explained to the patient that he will likely need insulin in the future. For now we will try Onglyza and Invokana as these are the preferred agents for his insurance. Continue metformin. Recent labs from the hospital showed good renal function Given the use of glycosuric agent I have recommended very low threshold for calling in or seeking medical advice for symptoms of UTI which he understands  #Hypertension Elevated I have added to medications today for his diabetes, no extra medications have been added today, he will have a mild diuretic effect from the Invokana Continue to follow  #Osteoarthritis of the knees Stable, using naproxen Refilled   Orders Placed This Encounter  Procedures  . Bayer DCA Hb A1c Waived    Meds ordered this encounter  Medications  . DISCONTD: canagliflozin (INVOKANA) 300 MG TABS tablet    Sig: Take 1 tablet (300 mg total) by mouth daily before breakfast.    Dispense:  30 tablet    Refill:  2  .  saxagliptin HCl (ONGLYZA) 5 MG TABS tablet    Sig: Take 1 tablet (5 mg total) by mouth daily.    Dispense:  30 tablet    Refill:  2  . amLODipine (NORVASC) 10 MG tablet    Sig: Take 1 tablet (10 mg total) by mouth daily.    Dispense:  90 tablet    Refill:  3  . metoprolol tartrate (LOPRESSOR) 100 MG tablet    Sig: Take 1 tablet (100 mg total) by mouth 2 (two) times daily.    Dispense:  180 tablet    Refill:  3  . atorvastatin (LIPITOR) 80 MG tablet    Sig: Take 1 tablet (80 mg total) by mouth daily.    Dispense:  90 tablet    Refill:  3  . canagliflozin (INVOKANA) 300 MG TABS tablet    Sig: Take 1 tablet (300 mg total) by mouth daily before breakfast.    Dispense:  90 tablet    Refill:  3  . metFORMIN (GLUCOPHAGE) 1000 MG tablet    Sig: Take 1 tablet (1,000 mg total) by mouth 2 (two) times daily with a meal.    Dispense:  60 tablet    Refill:  5    Jason SinkSam Emilene Roma, MD [facility] 01/03/2018, 5:16 PM

## 2018-01-04 ENCOUNTER — Telehealth: Payer: Self-pay | Admitting: Family Medicine

## 2018-01-04 NOTE — Telephone Encounter (Signed)
Patient states he can not afford onglyza. I printed a coupon card for patient for him to try and take to pharmacy.

## 2018-01-22 ENCOUNTER — Other Ambulatory Visit: Payer: Self-pay | Admitting: Family Medicine

## 2018-02-15 ENCOUNTER — Encounter: Payer: Self-pay | Admitting: *Deleted

## 2018-04-04 ENCOUNTER — Ambulatory Visit: Payer: BLUE CROSS/BLUE SHIELD | Admitting: Family Medicine

## 2018-04-08 ENCOUNTER — Ambulatory Visit: Payer: BLUE CROSS/BLUE SHIELD | Admitting: Family Medicine

## 2018-04-08 ENCOUNTER — Encounter: Payer: Self-pay | Admitting: Family Medicine

## 2018-04-08 VITALS — Temp 98.3°F | Ht 71.0 in | Wt >= 6400 oz

## 2018-04-08 DIAGNOSIS — E119 Type 2 diabetes mellitus without complications: Secondary | ICD-10-CM | POA: Diagnosis not present

## 2018-04-08 DIAGNOSIS — N3941 Urge incontinence: Secondary | ICD-10-CM | POA: Diagnosis not present

## 2018-04-08 DIAGNOSIS — N3001 Acute cystitis with hematuria: Secondary | ICD-10-CM

## 2018-04-08 LAB — URINALYSIS, COMPLETE
Bilirubin, UA: NEGATIVE
Nitrite, UA: POSITIVE — AB
Specific Gravity, UA: 1.02 (ref 1.005–1.030)
Urobilinogen, Ur: 1 mg/dL (ref 0.2–1.0)
pH, UA: 5.5 (ref 5.0–7.5)

## 2018-04-08 LAB — MICROSCOPIC EXAMINATION
Renal Epithel, UA: NONE SEEN /hpf
WBC, UA: >30 /hpf — AB (ref 0–5)

## 2018-04-08 LAB — BAYER DCA HB A1C WAIVED: HB A1C (BAYER DCA - WAIVED): 10.7 % — ABNORMAL HIGH (ref <7.0)

## 2018-04-08 MED ORDER — LEVOFLOXACIN 750 MG PO TABS: 750.0000 mg | ORAL_TABLET | Freq: Every day | ORAL | 0 refills | Status: DC

## 2018-04-08 NOTE — Progress Notes (Signed)
   HPI  Patient presents today here for follow-up chronic medical conditions.  Type 2 diabetes Patient reports good medication compliance with Onglyza, he has never taken Invokana. Average fasting blood sugar is around 180-220. No hypoglycemia. He very much does not want to take any injections.  He would consider Trulicity or other weekly injection if he is not able to control his A1c with diet.  UTI Patient reports urinary frequency for 2 days.  Also has increased urgency. Patient also states that the color of his urine is not normal. He has several month history of urge incontinence and difficulty making it to the bathroom frequently.  He has a bucket next to the bed to go urinate as he is afraid that he will not make it to the bathroom. He would like to be referred to urology.  PMH: Smoking status noted ROS: Per HPI  Objective: Temp 98.3 F (36.8 C) (Oral)   Ht '5\' 11"'$  (1.803 m)   Wt (!) 403 lb 3.2 oz (182.9 kg)   BMI 56.23 kg/m  Gen: NAD, alert, cooperative with exam HEENT: NCAT, EOMI, PERRL CV: RRR, good S1/S2, no murmur Resp: CTABL, no wheezes, non-labored Abd: SNTND, BS present, no guarding or organomegaly Ext: No edema, warm Neuro: Alert and oriented, No gross deficits  Assessment and plan:  #UTI Treat with Levaquin, patient also has a cough developing so this would cover any respiratory infection. Culture   #Urge incontinence Refer to urology, symptoms for many months  #Type 2 diabetes Uncontrolled A1c pending Recommended GLP or even insulin, he will consider closely. He never took Invokana, with frequent UTI I do not believe this is a good idea so we have discontinued this.   Orders Placed This Encounter  Procedures  . Urine Culture  . Urinalysis, Complete  . Bayer DCA Hb A1c Waived  . CMP14+EGFR  . CBC with Differential/Platelet  . Lipid panel  . Ambulatory referral to Urology    Referral Priority:   Routine    Referral Type:   Consultation   Referral Reason:   Specialty Services Required    Requested Specialty:   Urology    Number of Visits Requested:   1    Meds ordered this encounter  Medications  . levofloxacin (LEVAQUIN) 750 MG tablet    Sig: Take 1 tablet (750 mg total) by mouth daily.    Dispense:  7 tablet    Refill:  0    Laroy Apple, MD [facility] 04/08/2018, 5:11 PM

## 2018-04-08 NOTE — Patient Instructions (Signed)
Urinary Tract Infection, Adult  A urinary tract infection (UTI) is an infection of any part of the urinary tract, which includes the kidneys, ureters, bladder, and urethra. These organs make, store, and get rid of urine in the body. UTI can be a bladder infection (cystitis) or kidney infection (pyelonephritis).  What are the causes?  This infection may be caused by fungi, viruses, or bacteria. Bacteria are the most common cause of UTIs. This condition can also be caused by repeated incomplete emptying of the bladder during urination.  What increases the risk?  This condition is more likely to develop if:   You ignore your need to urinate or hold urine for long periods of time.   You do not empty your bladder completely during urination.   You wipe back to front after urinating or having a bowel movement, if you are male.   You are uncircumcised, if you are male.   You are constipated.   You have a urinary catheter that stays in place (indwelling).   You have a weak defense (immune) system.   You have a medical condition that affects your bowels, kidneys, or bladder.   You have diabetes.   You take antibiotic medicines frequently or for long periods of time, and the antibiotics no longer work well against certain types of infections (antibiotic resistance).   You take medicines that irritate your urinary tract.   You are exposed to chemicals that irritate your urinary tract.   You are male.    What are the signs or symptoms?  Symptoms of this condition include:   Fever.   Frequent urination or passing small amounts of urine frequently.   Needing to urinate urgently.   Pain or burning with urination.   Urine that smells bad or unusual.   Cloudy urine.   Pain in the lower abdomen or back.   Trouble urinating.   Blood in the urine.   Vomiting or being less hungry than normal.   Diarrhea or abdominal pain.   Vaginal discharge, if you are male.    How is this diagnosed?  This condition is  diagnosed with a medical history and physical exam. You will also need to provide a urine sample to test your urine. Other tests may be done, including:   Blood tests.   Sexually transmitted disease (STD) testing.    If you have had more than one UTI, a cystoscopy or imaging studies may be done to determine the cause of the infections.  How is this treated?  Treatment for this condition often includes a combination of two or more of the following:   Antibiotic medicine.   Other medicines to treat less common causes of UTI.   Over-the-counter medicines to treat pain.   Drinking enough water to stay hydrated.    Follow these instructions at home:   Take over-the-counter and prescription medicines only as told by your health care provider.   If you were prescribed an antibiotic, take it as told by your health care provider. Do not stop taking the antibiotic even if you start to feel better.   Avoid alcohol, caffeine, tea, and carbonated beverages. They can irritate your bladder.   Drink enough fluid to keep your urine clear or pale yellow.   Keep all follow-up visits as told by your health care provider. This is important.   Make sure to:  ? Empty your bladder often and completely. Do not hold urine for long periods of time.  ?   Empty your bladder before and after sex.  ? Wipe from front to back after a bowel movement if you are male. Use each tissue one time when you wipe.  Contact a health care provider if:   You have back pain.   You have a fever.   You feel nauseous or vomit.   Your symptoms do not get better after 3 days.   Your symptoms go away and then return.  Get help right away if:   You have severe back pain or lower abdominal pain.   You are vomiting and cannot keep down any medicines or water.  This information is not intended to replace advice given to you by your health care provider. Make sure you discuss any questions you have with your health care provider.  Document Released:  09/06/2005 Document Revised: 05/10/2016 Document Reviewed: 10/18/2015  Elsevier Interactive Patient Education  2018 [employer]vier Inc[employer]

## 2018-04-09 LAB — CMP14+EGFR
ALT: 25 IU/L (ref 0–44)
AST: 18 IU/L (ref 0–40)
Albumin/Globulin Ratio: 1.4 (ref 1.2–2.2)
Albumin: 3.8 g/dL (ref 3.5–5.5)
Alkaline Phosphatase: 76 IU/L (ref 39–117)
BUN/Creatinine Ratio: 10 (ref 9–20)
BUN: 11 mg/dL (ref 6–24)
Bilirubin Total: 1.2 mg/dL (ref 0.0–1.2)
CO2: 24 mmol/L (ref 20–29)
Calcium: 9.1 mg/dL (ref 8.7–10.2)
Chloride: 97 mmol/L (ref 96–106)
Creatinine, Ser: 1.09 mg/dL (ref 0.76–1.27)
GFR calc Af Amer: 88 mL/min/{1.73_m2} (ref >59)
GFR calc non Af Amer: 76 mL/min/{1.73_m2} (ref >59)
Globulin, Total: 2.8 g/dL (ref 1.5–4.5)
Glucose: 199 mg/dL — ABNORMAL HIGH (ref 65–99)
Potassium: 4.2 mmol/L (ref 3.5–5.2)
Sodium: 139 mmol/L (ref 134–144)
Total Protein: 6.6 g/dL (ref 6.0–8.5)

## 2018-04-09 LAB — CBC WITH DIFFERENTIAL/PLATELET
Basophils Absolute: 0 10*3/uL (ref 0.0–0.2)
Basos: 0 %
EOS (ABSOLUTE): 0 10*3/uL (ref 0.0–0.4)
Eos: 0 %
Hematocrit: 42.1 % (ref 37.5–51.0)
Hemoglobin: 14 g/dL (ref 13.0–17.7)
Immature Grans (Abs): 0 10*3/uL (ref 0.0–0.1)
Immature Granulocytes: 0 %
Lymphocytes Absolute: 2 10*3/uL (ref 0.7–3.1)
Lymphs: 14 %
MCH: 26.4 pg — ABNORMAL LOW (ref 26.6–33.0)
MCHC: 33.3 g/dL (ref 31.5–35.7)
MCV: 79 fL (ref 79–97)
Monocytes Absolute: 1.4 10*3/uL — ABNORMAL HIGH (ref 0.1–0.9)
Monocytes: 10 %
Neutrophils Absolute: 10.5 10*3/uL — ABNORMAL HIGH (ref 1.4–7.0)
Neutrophils: 76 %
Platelets: 248 10*3/uL (ref 150–379)
RBC: 5.31 x10E6/uL (ref 4.14–5.80)
RDW: 15.2 % (ref 12.3–15.4)
WBC: 13.9 10*3/uL — ABNORMAL HIGH (ref 3.4–10.8)

## 2018-04-09 LAB — LIPID PANEL
Chol/HDL Ratio: 5.6 ratio — ABNORMAL HIGH (ref 0.0–5.0)
Cholesterol, Total: 128 mg/dL (ref 100–199)
HDL: 23 mg/dL — ABNORMAL LOW (ref >39)
LDL Calculated: 77 mg/dL (ref 0–99)
Triglycerides: 140 mg/dL (ref 0–149)
VLDL Cholesterol Cal: 28 mg/dL (ref 5–40)

## 2018-04-12 LAB — URINE CULTURE

## 2018-05-04 ENCOUNTER — Other Ambulatory Visit: Payer: Self-pay | Admitting: Family Medicine

## 2018-05-13 ENCOUNTER — Other Ambulatory Visit: Payer: Self-pay | Admitting: Family Medicine

## 2018-06-08 ENCOUNTER — Other Ambulatory Visit: Payer: Self-pay | Admitting: Family Medicine

## 2018-06-09 ENCOUNTER — Other Ambulatory Visit: Payer: Self-pay | Admitting: Family Medicine

## 2018-06-27 ENCOUNTER — Encounter: Payer: Self-pay | Admitting: Family Medicine

## 2018-06-27 ENCOUNTER — Ambulatory Visit: Payer: BLUE CROSS/BLUE SHIELD | Admitting: Family Medicine

## 2018-06-27 VITALS — BP 155/84 | HR 72 | Temp 97.4°F | Ht 71.0 in | Wt >= 6400 oz

## 2018-06-27 DIAGNOSIS — R350 Frequency of micturition: Secondary | ICD-10-CM | POA: Diagnosis not present

## 2018-06-27 DIAGNOSIS — E119 Type 2 diabetes mellitus without complications: Secondary | ICD-10-CM | POA: Diagnosis not present

## 2018-06-27 MED ORDER — DULAGLUTIDE 0.75 MG/0.5ML ~~LOC~~ SOAJ: 0.7500 mg | SUBCUTANEOUS | 2 refills | Status: DC

## 2018-06-27 NOTE — Progress Notes (Signed)
   HPI  Patient presents today here for concern for UTI and follow-up diabetes.  Last visit we talked about several options for diabetic treatment.  He would now like to start a GLP.  He would like to return and start injections on Fridays as he thinks it will be easier to get the injection every Friday.  PMH: Smoking status noted ROS: Per HPI  Objective: BP (!) 155/84   Pulse 72   Temp (!) 97.4 F (36.3 C) (Oral)   Ht 5\' 11"  (1.803 m)   Wt (!) 417 lb 9.6 oz (189.4 kg)   BMI 58.24 kg/m  Gen: NAD, alert, cooperative with exam HEENT: NCAT CV: RRR, good S1/S2, no murmur Resp: CTABL, no wheezes, non-labored Ext: No edema, warm Neuro: Alert and oriented, No gross deficits  Assessment and plan:  #Type 2 diabetes Trulicity started today, first injection given Discussed return in 1 month for A1c and likely titration of medication  #Urinary frequency Patient with trace leukocytes esterase and negative nitrites, however micro is at least concerning for UTI Culture Also could be due to hyperglycemia     Orders Placed This Encounter  Procedures  . Urinalysis, Complete    Meds ordered this encounter  Medications  . Dulaglutide (TRULICITY) 0.75 MG/0.5ML SOPN    Sig: Inject 0.75 mg into the skin once a week.    Dispense:  4 pen    Refill:  2    Murtis SinkSam Katee Wentland, MD [facility] 06/27/2018, 4:41 PM

## 2018-06-28 LAB — URINALYSIS, COMPLETE
Bilirubin, UA: NEGATIVE
Ketones, UA: NEGATIVE
Nitrite, UA: NEGATIVE
Protein, UA: NEGATIVE
Specific Gravity, UA: 1.01 (ref 1.005–1.030)
Urobilinogen, Ur: 0.2 mg/dL (ref 0.2–1.0)
pH, UA: 5.5 (ref 5.0–7.5)

## 2018-06-28 LAB — MICROSCOPIC EXAMINATION
Epithelial Cells (non renal): NONE SEEN /hpf (ref 0–10)
Renal Epithel, UA: NONE SEEN /hpf
WBC, UA: >30 /hpf — AB (ref 0–5)

## 2018-06-29 ENCOUNTER — Other Ambulatory Visit: Payer: Self-pay | Admitting: Family Medicine

## 2018-06-29 ENCOUNTER — Telehealth: Payer: Self-pay | Admitting: Family Medicine

## 2018-06-29 LAB — URINE CULTURE

## 2018-06-29 MED ORDER — SULFAMETHOXAZOLE-TRIMETHOPRIM 800-160 MG PO TABS: 1.0000 | ORAL_TABLET | Freq: Two times a day (BID) | ORAL | 0 refills | Status: DC

## 2018-06-29 NOTE — Telephone Encounter (Signed)
Pts Urine culture shows likely infection- sent in Bactrim.   Jason SinkSam Scott Vanderveer, MD [facility] 06/29/2018, 8:28 AM

## 2018-06-29 NOTE — Telephone Encounter (Signed)
Patient notified to pick up meds

## 2018-07-08 ENCOUNTER — Telehealth: Payer: Self-pay | Admitting: Family Medicine

## 2018-07-08 NOTE — Telephone Encounter (Signed)
Pt states he was only able to take the Bactrim for 4 days due to diarrhea. He stopped taking it on Saturday and has had some loose stools still. Advised pt to try probiotics or otc Immodium to see if that helps with the symptoms. Does he need another antibiotic? Please advise.

## 2018-07-10 MED ORDER — CIPROFLOXACIN HCL 500 MG PO TABS: 500.0000 mg | ORAL_TABLET | Freq: Two times a day (BID) | ORAL | 0 refills | Status: DC

## 2018-07-10 NOTE — Telephone Encounter (Signed)
Change bactrim to cipro due to intolerance and UTI.   Murtis SinkSam [name], MD [facility] 07/10/2018, 12:12 PM

## 2018-07-10 NOTE — Telephone Encounter (Signed)
Pt aware of new rx sent in and voiced understanding.

## 2018-07-26 ENCOUNTER — Ambulatory Visit: Payer: BLUE CROSS/BLUE SHIELD | Admitting: Urology

## 2018-08-07 ENCOUNTER — Encounter: Payer: Self-pay | Admitting: Family Medicine

## 2018-08-07 ENCOUNTER — Ambulatory Visit: Payer: BLUE CROSS/BLUE SHIELD | Admitting: Family Medicine

## 2018-08-07 VITALS — BP 148/88 | HR 71 | Temp 97.6°F | Ht 71.0 in | Wt >= 6400 oz

## 2018-08-07 DIAGNOSIS — E139 Other specified diabetes mellitus without complications: Secondary | ICD-10-CM

## 2018-08-07 DIAGNOSIS — I152 Hypertension secondary to endocrine disorders: Secondary | ICD-10-CM

## 2018-08-07 DIAGNOSIS — E785 Hyperlipidemia, unspecified: Secondary | ICD-10-CM

## 2018-08-07 DIAGNOSIS — E1169 Type 2 diabetes mellitus with other specified complication: Secondary | ICD-10-CM

## 2018-08-07 DIAGNOSIS — E1159 Type 2 diabetes mellitus with other circulatory complications: Secondary | ICD-10-CM

## 2018-08-07 DIAGNOSIS — I10 Essential (primary) hypertension: Secondary | ICD-10-CM

## 2018-08-07 LAB — BAYER DCA HB A1C WAIVED: HB A1C (BAYER DCA - WAIVED): 10.9 % — ABNORMAL HIGH (ref <7.0)

## 2018-08-07 MED ORDER — SAXAGLIPTIN HCL 5 MG PO TABS: 5.0000 mg | ORAL_TABLET | Freq: Every day | ORAL | 2 refills | Status: DC

## 2018-08-07 NOTE — Progress Notes (Signed)
BP (!) 148/88   Pulse 71   Temp 97.6 F (36.4 C) (Oral)   Ht '5\' 11"'$  (1.803 m)   Wt (!) 423 lb 3.2 oz (192 kg)   BMI 59.02 kg/m    Subjective:    Patient ID: Jason Meyer, male    DOB: [birth year], 57 y.o.   MRN: [id number]  HPI: Jason Meyer is a 57 y.o. male presenting on 08/07/2018 for Diabetes   HPI Type 2 diabetes mellitus Patient comes in today for recheck of his diabetes. Patient has been currently taking Onglyza and Trulicity and metformin. Patient is not currently on an ACE inhibitor/ARB. Patient has not seen an ophthalmologist this year. Patient denies any issues with their feet.  Declines foot exam today because he has stockings on  Hypertension Patient is currently on amlodipine and metoprolol, and their blood pressure today is 148/88, he would keep some closer numbers at home and bring them back with him next time. Patient denies any lightheadedness or dizziness. Patient denies headaches, blurred vision, chest pains, shortness of breath, or weakness. Denies any side effects from medication and is content with current medication.   Hyperlipidemia Patient is coming in for recheck of his hyperlipidemia. The patient is currently taking atorvastatin. They deny any issues with myalgias or history of liver damage from it. They deny any focal numbness or weakness or chest pain.   Relevant past medical, surgical, family and social history reviewed and updated as indicated. Interim medical history since our last visit reviewed. Allergies and medications reviewed and updated.  Review of Systems  Constitutional: Negative for chills and fever.  Eyes: Negative for visual disturbance.  Respiratory: Negative for shortness of breath and wheezing.   Cardiovascular: Positive for leg swelling (Wears compression stockings). Negative for chest pain.  Musculoskeletal: Negative for back pain and gait problem.  Skin: Negative for rash.  Neurological: Negative for dizziness, weakness,  light-headedness and numbness.  All other systems reviewed and are negative.   Per HPI unless specifically indicated above   Allergies as of 08/07/2018      Reactions   Penicillins       Medication List        Accurate as of 08/07/18  3:49 PM. Always use your most recent med list.          albuterol (2.5 MG/3ML) 0.083% nebulizer solution Commonly known as:  PROVENTIL Take 3 mLs (2.5 mg total) by nebulization every 4 (four) hours as needed for wheezing or shortness of breath.   amLODipine 10 MG tablet Commonly known as:  NORVASC Take 1 tablet (10 mg total) by mouth daily.   atorvastatin 80 MG tablet Commonly known as:  LIPITOR Take 1 tablet (80 mg total) by mouth daily.   Dulaglutide 0.75 MG/0.5ML Sopn Inject 0.75 mg into the skin once a week.   fluticasone 50 MCG/ACT nasal spray Commonly known as:  FLONASE Place 2 sprays into both nostrils daily.   metFORMIN 1000 MG tablet Commonly known as:  GLUCOPHAGE Take 1 tablet (1,000 mg total) by mouth 2 (two) times daily with a meal.   metoprolol tartrate 100 MG tablet Commonly known as:  LOPRESSOR Take 1 tablet (100 mg total) by mouth 2 (two) times daily.   naproxen 500 MG tablet Commonly known as:  NAPROSYN TAKE 1 TABLET (500 MG TOTAL) BY MOUTH 2 (TWO) TIMES DAILY WITH A MEAL.   saxagliptin HCl 5 MG Tabs tablet Commonly known as:  ONGLYZA Take 1 tablet (5 mg total) by  mouth daily.   sildenafil 20 MG tablet Commonly known as:  REVATIO 2-5 pills once daily as needed for erectile dysfunction   triamcinolone ointment 0.5 % Commonly known as:  KENALOG APPLY TOPICALLY TWICE DAILY          Objective:    BP (!) 148/88   Pulse 71   Temp 97.6 F (36.4 C) (Oral)   Ht _0  (1.803 m)   Wt (!) 423 lb 3.2 oz (192 kg)   BMI 59.02 kg/m   Wt Readings from Last 3 Encounters:  08/07/18 (!) 423 lb 3.2 oz (192 kg)  06/27/18 (!) 417 lb 9.6 oz (189.4 kg)  04/08/18 (!) 403 lb 3.2 oz (182.9 kg)    Physical Exam    Constitutional: He is oriented to person, place, and time. He appears well-developed and well-nourished. No distress.  Eyes: Conjunctivae are normal. No scleral icterus.  Neck: Neck supple. No thyromegaly present.  Cardiovascular: Normal rate, regular rhythm, normal heart sounds and intact distal pulses.  No murmur heard. Pulmonary/Chest: Effort normal and breath sounds normal. No respiratory distress. He has no wheezes.  Musculoskeletal: Normal range of motion. He exhibits edema (2-3+ edema, thick and chronic).  Lymphadenopathy:    He has no cervical adenopathy.  Neurological: He is alert and oriented to person, place, and time. Coordination normal.  Skin: Skin is warm and dry. No rash noted. He is not diaphoretic.  Psychiatric: He has a normal mood and affect. His behavior is normal.  Nursing note and vitals reviewed.       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  I do not know why patient is on both advised that and Trulicity but we will check his labs today and then will make a decision on where we need to go from there.  We will likely stop the advised that depending on where his labs  are  Continue metformin  Follow up plan: Return in about 3 months (around 11/07/2018), or if symptoms worsen or fail to improve, for Recheck diabetes.  Counseling provided for all of the vaccine components Orders Placed This Encounter  Procedures  . Bayer DCA Hb A1c Waived  . Microalbumin / creatinine urine ratio  . CMP14+EGFR  . Lipid panel    Caryl Pina, MD [facility] 08/07/2018, 3:49 PM

## 2018-08-08 ENCOUNTER — Telehealth: Payer: Self-pay | Admitting: Family Medicine

## 2018-08-08 LAB — CMP14+EGFR
ALT: 19 IU/L (ref 0–44)
AST: 20 IU/L (ref 0–40)
Albumin/Globulin Ratio: 1.4 (ref 1.2–2.2)
Albumin: 4.1 g/dL (ref 3.5–5.5)
Alkaline Phosphatase: 100 IU/L (ref 39–117)
BUN/Creatinine Ratio: 9 (ref 9–20)
BUN: 10 mg/dL (ref 6–24)
Bilirubin Total: 0.4 mg/dL (ref 0.0–1.2)
CO2: 25 mmol/L (ref 20–29)
Calcium: 9.6 mg/dL (ref 8.7–10.2)
Chloride: 100 mmol/L (ref 96–106)
Creatinine, Ser: 1.11 mg/dL (ref 0.76–1.27)
GFR calc Af Amer: 86 mL/min/{1.73_m2} (ref >59)
GFR calc non Af Amer: 74 mL/min/{1.73_m2} (ref >59)
Globulin, Total: 3 g/dL (ref 1.5–4.5)
Glucose: 138 mg/dL — ABNORMAL HIGH (ref 65–99)
Potassium: 4.5 mmol/L (ref 3.5–5.2)
Sodium: 142 mmol/L (ref 134–144)
Total Protein: 7.1 g/dL (ref 6.0–8.5)

## 2018-08-08 LAB — LIPID PANEL
Chol/HDL Ratio: 4.8 ratio (ref 0.0–5.0)
Cholesterol, Total: 158 mg/dL (ref 100–199)
HDL: 33 mg/dL — ABNORMAL LOW (ref >39)
LDL Calculated: 101 mg/dL — ABNORMAL HIGH (ref 0–99)
Triglycerides: 121 mg/dL (ref 0–149)
VLDL Cholesterol Cal: 24 mg/dL (ref 5–40)

## 2018-08-08 LAB — MICROALBUMIN / CREATININE URINE RATIO
Creatinine, Urine: 98.5 mg/dL
Microalb/Creat Ratio: 33.7 mg/g creat — ABNORMAL HIGH (ref 0.0–30.0)
Microalbumin, Urine: 33.2 ug/mL

## 2018-08-08 MED ORDER — LISINOPRIL 10 MG PO TABS: 10.0000 mg | ORAL_TABLET | Freq: Every day | ORAL | 3 refills | Status: DC

## 2018-08-08 MED ORDER — EMPAGLIFLOZIN 10 MG PO TABS: 10.0000 mg | ORAL_TABLET | Freq: Every day | ORAL | 3 refills | Status: DC

## 2018-08-08 NOTE — Telephone Encounter (Signed)
Pt aware and states understanding.

## 2018-08-08 NOTE — Telephone Encounter (Signed)
LMTCB

## 2018-08-08 NOTE — Telephone Encounter (Signed)
Patient's A1c has increased and is now 10.9, have him stop the Onglyza, have him continue his Trulicity and metformin and I have added Jardiance.  Patient's urine shows that he is getting some early renal damage so I have added lisinopril 10 mg which is a blood pressure pill that protects his kidneys from diabetes and hypertension Jason CareJoshua [name], MD [facility] 08/08/2018, 1:04 PM

## 2018-08-29 ENCOUNTER — Other Ambulatory Visit: Payer: Self-pay | Admitting: Family Medicine

## 2018-09-06 ENCOUNTER — Ambulatory Visit: Payer: BLUE CROSS/BLUE SHIELD | Admitting: Urology

## 2018-09-16 ENCOUNTER — Other Ambulatory Visit: Payer: Self-pay | Admitting: *Deleted

## 2018-09-16 MED ORDER — NAPROXEN 500 MG PO TABS: 500.0000 mg | ORAL_TABLET | Freq: Two times a day (BID) | ORAL | 0 refills | Status: DC

## 2018-10-10 ENCOUNTER — Encounter: Payer: Self-pay | Admitting: Family Medicine

## 2018-10-10 ENCOUNTER — Ambulatory Visit: Payer: BLUE CROSS/BLUE SHIELD | Admitting: Family Medicine

## 2018-10-10 VITALS — BP 151/89 | HR 76 | Temp 98.6°F | Ht 71.0 in

## 2018-10-10 DIAGNOSIS — R300 Dysuria: Secondary | ICD-10-CM

## 2018-10-10 DIAGNOSIS — N410 Acute prostatitis: Secondary | ICD-10-CM | POA: Diagnosis not present

## 2018-10-10 DIAGNOSIS — J441 Chronic obstructive pulmonary disease with (acute) exacerbation: Secondary | ICD-10-CM

## 2018-10-10 DIAGNOSIS — R350 Frequency of micturition: Secondary | ICD-10-CM | POA: Diagnosis not present

## 2018-10-10 LAB — MICROSCOPIC EXAMINATION
Renal Epithel, UA: NONE SEEN /hpf
WBC, UA: >30 /hpf — AB (ref 0–5)

## 2018-10-10 LAB — URINALYSIS, ROUTINE W REFLEX MICROSCOPIC
Bilirubin, UA: NEGATIVE
Ketones, UA: NEGATIVE
Nitrite, UA: POSITIVE — AB
Protein, UA: NEGATIVE
Specific Gravity, UA: <1.005 — ABNORMAL LOW (ref 1.005–1.030)
Urobilinogen, Ur: 0.2 mg/dL (ref 0.2–1.0)
pH, UA: 5.5 (ref 5.0–7.5)

## 2018-10-10 MED ORDER — DULAGLUTIDE 1.5 MG/0.5ML ~~LOC~~ SOAJ: 1.5000 mg | SUBCUTANEOUS | 5 refills | Status: DC

## 2018-10-10 MED ORDER — DOXYCYCLINE HYCLATE 100 MG PO TABS: 100.0000 mg | ORAL_TABLET | Freq: Two times a day (BID) | ORAL | 0 refills | Status: AC

## 2018-10-10 NOTE — Progress Notes (Signed)
Subjective:  Patient ID: Jason Meyer, male    DOB: March 05, 1961  Age: 57 y.o. MRN: [id number]  CC: Urinary Frequency   HPI Jason Meyer presents for burning with urination and frequencygettin worse in spite of recent treatment. Onset 4-6 weeks ago.. Denies fever . No flank pain. No nausea, vomiting. Pt. Is a diabetic. Likes Trulicity. Would like to go up to the full dose (1.5 mg weekly.) Currently on 0.75.    Depression screen [facility] 2/9 08/07/2018 06/27/2018 04/08/2018  Decreased Interest 0 0 0  Down, Depressed, Hopeless 0 0 0  PHQ - 2 Score 0 0 0    History Jason Meyer has a past medical history of Allergy, Diabetes mellitus without complication (HCC), Hyperlipidemia, and Hypertension.   He has no past surgical history on file.   His family history includes Alzheimer's disease in his mother; Heart attack in his brother and father; Stroke in his father.He reports that he quit smoking about 10 years ago. His smoking use included cigarettes. He has a 33.00 pack-year smoking history. He has never used smokeless tobacco. He reports that he does not drink alcohol or use drugs.    ROS Review of Systems  Constitutional: Negative for chills, diaphoresis, fever and unexpected weight change.  HENT: Negative for rhinorrhea and trouble swallowing.   Respiratory: Positive for cough and shortness of breath. Negative for chest tightness.   Cardiovascular: Negative for chest pain.  Gastrointestinal: Positive for abdominal pain. Negative for abdominal distention, blood in stool, constipation, diarrhea, nausea, rectal pain and vomiting.  Genitourinary: Positive for urgency. Negative for dysuria, flank pain and hematuria.  Musculoskeletal: Negative for arthralgias and joint swelling.  Skin: Negative for rash.  Neurological: Negative for syncope and headaches.    Objective:  BP (!) 151/89   Pulse 76   Temp 98.6 F (37 C)   Ht 5\' 11"  (1.803 m)   BMI 59.02 kg/m   BP Readings from Last 3  Encounters:  10/10/18 (!) 151/89  08/07/18 (!) 148/88  06/27/18 (!) 155/84    Wt Readings from Last 3 Encounters:  08/07/18 (!) 423 lb 3.2 oz (192 kg)  06/27/18 (!) 417 lb 9.6 oz (189.4 kg)  04/08/18 (!) 403 lb 3.2 oz (182.9 kg)     Physical Exam  Constitutional: He appears well-developed and well-nourished.  HENT:  Head: Normocephalic and atraumatic.  Right Ear: Tympanic membrane and external ear normal. No decreased hearing is noted.  Left Ear: Tympanic membrane and external ear normal. No decreased hearing is noted.  Mouth/Throat: No oropharyngeal exudate or posterior oropharyngeal erythema.  Eyes: Pupils are equal, round, and reactive to light.  Neck: Normal range of motion. Neck supple.  Cardiovascular: Normal rate and regular rhythm.  No murmur heard. Pulmonary/Chest: No respiratory distress. He has wheezes.  Abdominal: Soft. Bowel sounds are normal. He exhibits no mass. There is no tenderness.  Genitourinary: Rectum normal. Prostate is enlarged and tender.  Vitals reviewed.     Assessment & Plan:   Jason Meyer was seen today for urinary frequency.  Diagnoses and all orders for this visit:  Urinary frequency -     Urine Culture -     Urinalysis, Routine w reflex microscopic  Dysuria  COPD exacerbation (HCC) -     doxycycline (VIBRA-TABS) 100 MG tablet; Take 1 tablet (100 mg total) by mouth 2 (two) times daily for 15 days. 1 po bid  Acute prostatitis  Other orders -     Dulaglutide (TRULICITY) 1.5 MG/0.5ML SOPN; Inject 1.5 mg  into the skin every 7 (seven) days. -     Microscopic Examination       I have changed Jason Meyer's doxycycline. I am also having him start on Dulaglutide. Additionally, I am having him maintain his fluticasone, albuterol, sildenafil, triamcinolone ointment, amLODipine, metoprolol tartrate, atorvastatin, Dulaglutide, lisinopril, empagliflozin, metFORMIN, and naproxen.  Allergies as of 10/10/2018      Reactions   Penicillins         Medication List        Accurate as of 10/10/18 11:59 PM. Always use your most recent med list.          albuterol (2.5 MG/3ML) 0.083% nebulizer solution Commonly known as:  PROVENTIL Take 3 mLs (2.5 mg total) by nebulization every 4 (four) hours as needed for wheezing or shortness of breath.   amLODipine 10 MG tablet Commonly known as:  NORVASC Take 1 tablet (10 mg total) by mouth daily.   atorvastatin 80 MG tablet Commonly known as:  LIPITOR Take 1 tablet (80 mg total) by mouth daily.   doxycycline 100 MG tablet Commonly known as:  VIBRA-TABS Take 1 tablet (100 mg total) by mouth 2 (two) times daily for 15 days. 1 po bid   Dulaglutide 0.75 MG/0.5ML Sopn Inject 0.75 mg into the skin once a week.   Dulaglutide 1.5 MG/0.5ML Sopn Inject 1.5 mg into the skin every 7 (seven) days.   empagliflozin 10 MG Tabs tablet Commonly known as:  JARDIANCE Take 10 mg by mouth daily.   fluticasone 50 MCG/ACT nasal spray Commonly known as:  FLONASE Place 2 sprays into both nostrils daily.   lisinopril 10 MG tablet Commonly known as:  PRINIVIL,ZESTRIL Take 1 tablet (10 mg total) by mouth daily.   metFORMIN 1000 MG tablet Commonly known as:  GLUCOPHAGE TAKE 1 TABLET (1,000 MG TOTAL) BY MOUTH 2 (TWO) TIMES DAILY WITH A MEAL.   metoprolol tartrate 100 MG tablet Commonly known as:  LOPRESSOR Take 1 tablet (100 mg total) by mouth 2 (two) times daily.   naproxen 500 MG tablet Commonly known as:  NAPROSYN Take 1 tablet (500 mg total) by mouth 2 (two) times daily with a meal.   sildenafil 20 MG tablet Commonly known as:  REVATIO 2-5 pills once daily as needed for erectile dysfunction   triamcinolone ointment 0.5 % Commonly known as:  KENALOG APPLY TOPICALLY TWICE DAILY        Follow-up: Return in about 6 weeks (around 11/21/2018).  Mechele Claude, M.D.

## 2018-10-12 LAB — URINE CULTURE

## 2018-10-13 ENCOUNTER — Encounter: Payer: Self-pay | Admitting: Family Medicine

## 2018-11-06 ENCOUNTER — Other Ambulatory Visit: Payer: Self-pay | Admitting: Family Medicine

## 2018-11-13 ENCOUNTER — Ambulatory Visit: Payer: BLUE CROSS/BLUE SHIELD | Admitting: Family Medicine

## 2018-11-26 ENCOUNTER — Other Ambulatory Visit: Payer: Self-pay | Admitting: Family Medicine

## 2018-11-27 ENCOUNTER — Ambulatory Visit: Payer: Self-pay | Admitting: Family Medicine

## 2018-12-09 ENCOUNTER — Telehealth: Payer: Self-pay | Admitting: Family Medicine

## 2018-12-09 NOTE — Telephone Encounter (Signed)
Patient aware and verbalizes understanding- number given.

## 2018-12-09 NOTE — Telephone Encounter (Signed)
Please have patient contact rocking [place] assistance to help with the finances but likely the reason that it went up is because he hit the donut hole, there is financial assistance for people in the donut hole, there are not realistically other medications that are cheaper that are similar to this in its class.

## 2018-12-13 ENCOUNTER — Encounter: Payer: Self-pay | Admitting: Family Medicine

## 2019-01-29 ENCOUNTER — Other Ambulatory Visit: Payer: Self-pay | Admitting: *Deleted

## 2019-01-29 DIAGNOSIS — I10 Essential (primary) hypertension: Secondary | ICD-10-CM

## 2019-01-29 MED ORDER — AMLODIPINE BESYLATE 10 MG PO TABS: 10.0000 mg | ORAL_TABLET | Freq: Every day | ORAL | 0 refills | Status: DC

## 2019-02-10 ENCOUNTER — Other Ambulatory Visit: Payer: Self-pay | Admitting: *Deleted

## 2019-02-10 DIAGNOSIS — I10 Essential (primary) hypertension: Secondary | ICD-10-CM

## 2019-02-10 MED ORDER — METOPROLOL TARTRATE 100 MG PO TABS: 100.0000 mg | ORAL_TABLET | Freq: Two times a day (BID) | ORAL | 0 refills | Status: DC

## 2019-02-19 ENCOUNTER — Other Ambulatory Visit: Payer: Self-pay | Admitting: Family Medicine

## 2019-02-24 ENCOUNTER — Other Ambulatory Visit: Payer: Self-pay | Admitting: Family Medicine

## 2019-03-01 ENCOUNTER — Other Ambulatory Visit: Payer: Self-pay | Admitting: Family Medicine

## 2019-03-01 DIAGNOSIS — I10 Essential (primary) hypertension: Secondary | ICD-10-CM

## 2019-03-04 ENCOUNTER — Other Ambulatory Visit: Payer: Self-pay

## 2019-03-05 ENCOUNTER — Telehealth (INDEPENDENT_AMBULATORY_CARE_PROVIDER_SITE_OTHER): Payer: BLUE CROSS/BLUE SHIELD | Admitting: Family Medicine

## 2019-03-05 DIAGNOSIS — I10 Essential (primary) hypertension: Secondary | ICD-10-CM | POA: Diagnosis not present

## 2019-03-05 DIAGNOSIS — E1159 Type 2 diabetes mellitus with other circulatory complications: Secondary | ICD-10-CM | POA: Diagnosis not present

## 2019-03-05 DIAGNOSIS — E1169 Type 2 diabetes mellitus with other specified complication: Secondary | ICD-10-CM

## 2019-03-05 DIAGNOSIS — E139 Other specified diabetes mellitus without complications: Secondary | ICD-10-CM

## 2019-03-05 DIAGNOSIS — E785 Hyperlipidemia, unspecified: Secondary | ICD-10-CM

## 2019-03-05 DIAGNOSIS — I152 Hypertension secondary to endocrine disorders: Secondary | ICD-10-CM

## 2019-03-05 MED ORDER — BLOOD GLUCOSE MONITOR KIT: 1.0000 | PACK | Freq: Every day | 0 refills | Status: DC

## 2019-03-05 MED ORDER — METOPROLOL TARTRATE 100 MG PO TABS: 100.0000 mg | ORAL_TABLET | Freq: Two times a day (BID) | ORAL | 3 refills | Status: DC

## 2019-03-05 MED ORDER — ATORVASTATIN CALCIUM 80 MG PO TABS: 80.0000 mg | ORAL_TABLET | Freq: Every day | ORAL | 3 refills | Status: DC

## 2019-03-05 MED ORDER — DULAGLUTIDE 1.5 MG/0.5ML ~~LOC~~ SOAJ: 1.5000 mg | SUBCUTANEOUS | 5 refills | Status: DC

## 2019-03-05 MED ORDER — ALBUTEROL SULFATE (2.5 MG/3ML) 0.083% IN NEBU: 2.5000 mg | INHALATION_SOLUTION | RESPIRATORY_TRACT | 2 refills | Status: DC | PRN

## 2019-03-05 MED ORDER — METFORMIN HCL 1000 MG PO TABS: 1000.0000 mg | ORAL_TABLET | Freq: Two times a day (BID) | ORAL | 3 refills | Status: DC

## 2019-03-05 MED ORDER — LISINOPRIL 10 MG PO TABS: 10.0000 mg | ORAL_TABLET | Freq: Every day | ORAL | 3 refills | Status: DC

## 2019-03-05 MED ORDER — GLUCOSE BLOOD VI STRP: ORAL_STRIP | 3 refills | Status: DC

## 2019-03-05 MED ORDER — LISINOPRIL 20 MG PO TABS: 20.0000 mg | ORAL_TABLET | Freq: Every day | ORAL | 3 refills | Status: DC

## 2019-03-05 MED ORDER — NAPROXEN 500 MG PO TABS: 500.0000 mg | ORAL_TABLET | Freq: Two times a day (BID) | ORAL | 5 refills | Status: DC

## 2019-03-05 MED ORDER — AMLODIPINE BESYLATE 10 MG PO TABS: 10.0000 mg | ORAL_TABLET | Freq: Every day | ORAL | 3 refills | Status: DC

## 2019-03-05 NOTE — Addendum Note (Signed)
Addended by: Lorelee Cover C on: 03/05/2019 11:00 AM   Modules accepted: Orders

## 2019-03-05 NOTE — Progress Notes (Signed)
Virtual Visit via telephone Note  I connected with Jason Meyer on 03/05/19 at 1012 by telephone and verified that I am speaking with the correct person using two identifiers. Jason Meyer is currently located in his vehicle on the road but he pulled over to talk and No other people are currently with her during visit. The provider, Elige Radon Dejuan Elman, MD is located in their office at time of visit.  Call ended at 1027  I discussed the limitations, risks, security and privacy concerns of performing an evaluation and management service by telephone and the availability of in person appointments. I also discussed with the patient that there may be a patient responsible charge related to this service. The patient expressed understanding and agreed to proceed.   History and Present Illness: Type 2 diabetes mellitus Patient comes in today for recheck of his diabetes. Patient has been currently taking trulicity and metformin. Patient is currently on an ACE inhibitor/ARB. Patient has not seen an ophthalmologist this year. Patient denies any issues with their feet. Urine frequency but no fevers   Hypertension Patient is currently at home 140/90, and their blood pressure today on amlodipine and lisin and metopr . Patient denies any lightheadedness or dizziness. Patient denies headaches, blurred vision, chest pains, shortness of breath, or weakness. Denies any side effects from medication and is content with current medication.   Hyperlipidemia Patient is coming in for recheck of his hyperlipidemia. The patient is currently taking atorvastatin. They deny any issues with myalgias or history of liver damage from it. They deny any focal numbness or weakness or chest pain.   No diagnosis found.  Outpatient Encounter Medications as of 03/05/2019  Medication Sig  . albuterol (PROVENTIL) (2.5 MG/3ML) 0.083% nebulizer solution Take 3 mLs (2.5 mg total) by nebulization every 4 (four) hours as needed  for wheezing or shortness of breath.  Marland Kitchen amLODipine (NORVASC) 10 MG tablet TAKE 1 TABLET (10 MG TOTAL) BY MOUTH DAILY. (NEEDS TO BE SEEN BEFORE NEXT REFILL)  . atorvastatin (LIPITOR) 80 MG tablet Take 1 tablet (80 mg total) by mouth daily.  . Dulaglutide (TRULICITY) 0.75 MG/0.5ML SOPN Inject 0.75 mg into the skin once a week.  . Dulaglutide (TRULICITY) 1.5 MG/0.5ML SOPN Inject 1.5 mg into the skin every 7 (seven) days.  . empagliflozin (JARDIANCE) 10 MG TABS tablet Take 10 mg by mouth daily.  . fluticasone (FLONASE) 50 MCG/ACT nasal spray Place 2 sprays into both nostrils daily.  Marland Kitchen lisinopril (PRINIVIL,ZESTRIL) 10 MG tablet Take 1 tablet (10 mg total) by mouth daily.  . metFORMIN (GLUCOPHAGE) 1000 MG tablet Take 1 tablet (1,000 mg total) by mouth 2 (two) times daily with a meal. (Needs to be seen before next refill for regular med ckup)  . metoprolol tartrate (LOPRESSOR) 100 MG tablet Take 1 tablet (100 mg total) by mouth 2 (two) times daily. (Needs to be seen before next refill)  . naproxen (NAPROSYN) 500 MG tablet TAKE 1 TABLET (500 MG TOTAL) BY MOUTH 2 (TWO) TIMES DAILY WITH A MEAL.  . sildenafil (REVATIO) 20 MG tablet 2-5 pills once daily as needed for erectile dysfunction  . triamcinolone ointment (KENALOG) 0.5 % APPLY TOPICALLY TWICE DAILY   No facility-administered encounter medications on file as of 03/05/2019.     Review of Systems  Constitutional: Negative for chills and fever.  Eyes: Negative for visual disturbance.  Respiratory: Negative for shortness of breath and wheezing.   Cardiovascular: Negative for chest pain and leg swelling.  Gastrointestinal: Negative for abdominal pain.  Genitourinary: Positive for frequency. Negative for decreased urine volume, difficulty urinating, dysuria, flank pain, hematuria and urgency.  Musculoskeletal: Negative for back pain and gait problem.  Skin: Negative for rash.  Neurological: Negative for dizziness, weakness, light-headedness, numbness  and headaches.  All other systems reviewed and are negative.   Observations/Objective: Patient is on the road on his way to work but he sounds in good spirits and sounds healthy and sounds like he is not in any distress currently.  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   Relevant Medications   atorvastatin (LIPITOR) 80 MG tablet   Dulaglutide (TRULICITY) 1.5 MG/0.5ML SOPN   metFORMIN (GLUCOPHAGE) 1000 MG tablet   lisinopril (PRINIVIL,ZESTRIL) 20 MG tablet   Hyperlipidemia associated with type 2 diabetes mellitus (HCC)   Relevant Medications   atorvastatin (LIPITOR) 80 MG tablet   Dulaglutide (TRULICITY) 1.5 MG/0.5ML SOPN   metFORMIN (GLUCOPHAGE) 1000 MG tablet   lisinopril (PRINIVIL,ZESTRIL) 20 MG tablet    Other Visit Diagnoses    Essential hypertension       Relevant Medications   amLODipine (NORVASC) 10 MG tablet   atorvastatin (LIPITOR) 80 MG tablet   metoprolol tartrate (LOPRESSOR) 100 MG tablet   lisinopril (PRINIVIL,ZESTRIL) 20 MG tablet      Discussed with patient he is having urinary frequency but no burning or blood or flank pain or fevers or chills, it sounds like his sugars are running up and recommended for him to test, he did not have a machine so he will obtain 1 and I sent a prescription for that. Follow Up Instructions: I instructed him that he needs a follow-up in 3 months for his regular diabetes check and that this should all be blown over by then.  Instructed patient to check his sugars and if they are not up or if  he develops any fevers or chills or flank pain or abdominal pain and call us back and we can send in an antibiotic for his urine.   I discussed the assessment and treatment plan with the patient. The patient was provided an opportunity to ask questions and all were answered. The patient agreed with the plan and demonstrated an understanding of the instructions.   The patient was advised to call back or seek an in-person evaluation if the symptoms worsen or if the condition fails to improve as anticipated.  The above assessment and management plan was discussed with the patient. The patient verbalized understanding of and has agreed to the management plan. Patient is aware to call the clinic if symptoms persist or worsen. Patient is aware when to return to the clinic for a follow-up visit. Patient educated on when it is appropriate to go to the emergency department.    I provided 15 minutes of non-face-to-face time during this encounter.    Nils Pyle, MD

## 2019-03-06 ENCOUNTER — Telehealth: Payer: Self-pay | Admitting: *Deleted

## 2019-03-06 MED ORDER — BLOOD GLUCOSE MONITOR KIT: PACK | 0 refills | Status: DC

## 2019-03-06 MED ORDER — ACCU-CHEK AVIVA PLUS W/DEVICE KIT: PACK | 0 refills | Status: DC

## 2019-03-06 MED ORDER — ACCU-CHEK SOFTCLIX LANCETS MISC: 3 refills | Status: DC

## 2019-03-06 MED ORDER — GLUCOSE BLOOD VI STRP: ORAL_STRIP | 3 refills | Status: DC

## 2019-03-06 NOTE — Telephone Encounter (Signed)
Sig & orders corrected and sent to pharmacy

## 2019-03-11 ENCOUNTER — Encounter: Payer: Self-pay | Admitting: Nurse Practitioner

## 2019-03-11 ENCOUNTER — Ambulatory Visit: Payer: BLUE CROSS/BLUE SHIELD | Admitting: Nurse Practitioner

## 2019-03-11 ENCOUNTER — Other Ambulatory Visit: Payer: Self-pay

## 2019-03-11 ENCOUNTER — Ambulatory Visit (INDEPENDENT_AMBULATORY_CARE_PROVIDER_SITE_OTHER): Payer: BLUE CROSS/BLUE SHIELD | Admitting: Family

## 2019-03-11 ENCOUNTER — Encounter: Payer: Self-pay | Admitting: Family

## 2019-03-11 VITALS — BP 183/89 | HR 68 | Temp 97.9°F | Ht 71.0 in | Wt >= 6400 oz

## 2019-03-11 DIAGNOSIS — R29810 Facial weakness: Secondary | ICD-10-CM | POA: Diagnosis not present

## 2019-03-11 DIAGNOSIS — G510 Bell's palsy: Secondary | ICD-10-CM

## 2019-03-11 MED ORDER — PREDNISONE 10 MG PO TABS: ORAL_TABLET | ORAL | 0 refills | Status: DC

## 2019-03-11 NOTE — Progress Notes (Signed)
   Subjective:    Patient ID: Jason Meyer, male    DOB: [birth year], 58 y.o.   MRN: [id number]   Chief Complaint: Facial drooping   HPI Patient did telephone visit with C. Hawks this morning and she wanted him to have a face to face visit to make sure he was not having a stroke. Patient said that he noticed left eye lid has not been closing all the way and face is a little droopy. He noticed it on Saturday. Has not changed. He denies any ext weakness, visual or speech changes.   Review of Systems  Constitutional: Negative.   HENT: Negative.   Eyes: Negative for photophobia, pain, discharge and visual disturbance.  Respiratory: Negative.   Cardiovascular: Negative.   Genitourinary: Negative.   Psychiatric/Behavioral: Negative.   All other systems reviewed and are negative.      Objective:   Physical Exam Vitals signs and nursing note reviewed.  Constitutional:      Appearance: Normal appearance.  Cardiovascular:     Rate and Rhythm: Normal rate and regular rhythm.     Heart sounds: Normal heart sounds.  Pulmonary:     Effort: Pulmonary effort is normal.     Breath sounds: Normal breath sounds.  Skin:    General: Skin is warm.  Neurological:     General: No focal deficit present.     Mental Status: He is alert and oriented to person, place, and time.     Comments: Left eyelid does not close all the way Left facial  drooping  Psychiatric:        Mood and Affect: Mood normal.        Behavior: Behavior normal.    BP (!) 183/89   Pulse 68   Temp 97.9 F (36.6 C) (Oral)   Ht 5\' 11"  (1.803 m)   Wt (!) 407 lb (184.6 kg)   BMI 56.76 kg/m         Assessment & Plan:  Jason Meyer in today with chief complaint of Facial drooping   1. Bell palsy Meds ordered this encounter  Medications  . predniSONE (DELTASONE) 10 MG tablet    Sig: 6 tablets daily for 5 days then 5 tab x1 day then 4 tablets x1 day, the 3tab x1d, then 2 tablwtx1 day then 1 tablet x1 day    Dispense:  45 tablet    Refill:  0    Order Specific Question:   Supervising Provider    Answer:   Arville Care A [[id number]]   Tape eyelid shut at night Avoid rubbing eye RTO prn  Mary-Margaret Daphine Deutscher, FNP

## 2019-03-11 NOTE — Patient Instructions (Signed)
Bell Palsy, Adult  Bell palsy is a short-term inability to move muscles in part of the face. The inability to move (paralysis) results from inflammation or compression of the facial nerve, which travels along the skull and under the ear to the side of the face (7th cranial nerve). This nerve is responsible for facial movements that include blinking, closing the eyes, smiling, and frowning. What are the causes? The exact cause of this condition is not known. It may be caused by an infection from a virus, such as the chickenpox (herpes zoster), Epstein-Barr, or mumps virus. What increases the risk? You are more likely to develop this condition if:  You are pregnant.  You have diabetes.  You have had a recent infection in your nose, throat, or airways (upper respiratory infection).  You have a weakened body defense system (immune system).  You have had a facial injury, such as a fracture.  You have a family history of Bell palsy. What are the signs or symptoms? Symptoms of this condition include:  Weakness on one side of the face.  Drooping eyelid and corner of the mouth.  Excessive tearing in one eye.  Difficulty closing the eyelid.  Dry eye.  Drooling.  Dry mouth.  Changes in taste.  Change in facial appearance.  Pain behind one ear.  Ringing in one or both ears.  Sensitivity to sound in one ear.  Facial twitching.  Headache.  Impaired speech.  Dizziness.  Difficulty eating or drinking. Most of the time, only one side of the face is affected. Rarely, Bell palsy affects the whole face. How is this diagnosed? This condition is diagnosed based on:  Your symptoms.  Your medical history.  A physical exam. You may also have to see health care providers who specialize in disorders of the nerves (neurologist) or diseases and conditions of the eye (ophthalmologist). You may have tests, such as:  A test to check for nerve damage (electromyogram).  Imaging  studies, such as CT or MRI scans.  Blood tests. How is this treated? This condition affects every person differently. Sometimes symptoms go away without treatment within a couple weeks. If treatment is needed, it varies from person to person. The goal of treatment is to reduce inflammation and protect the eye from damage. Treatment for Bell palsy may include:  Medicines, such as: ? Steroids to reduce swelling and inflammation. ? Antiviral drugs. ? Pain relievers, including aspirin, acetaminophen, or ibuprofen.  Eye drops or ointment to keep your eye moist.  Eye protection, if you cannot close your eye.  Exercises or massage to regain muscle strength and function (physical therapy). Follow these instructions at home:   Take over-the-counter and prescription medicines only as told by your health care provider.  If your eye is affected: ? Keep your eye moist with eye drops or ointment as told by your health care provider. ? Follow instructions for eye care and protection as told by your health care provider.  Do any physical therapy exercises as told by your health care provider.  Keep all follow-up visits as told by your health care provider. This is important. Contact a health care provider if:  You have a fever.  Your symptoms do not get better within 2-3 weeks, or your symptoms get worse.  Your eye is red, irritated, or painful.  You have new symptoms. Get help right away if:  You have weakness or numbness in a part of your body other than your face.  You have   trouble swallowing.  You develop neck pain or stiffness.  You develop dizziness or shortness of breath. Summary  Bell palsy is a short-term inability to move muscles in part of the face. The inability to move (paralysis) results from inflammation or compression of the facial nerve.  This condition affects every person differently. Sometimes symptoms go away without treatment within a couple weeks.  If  treatment is needed, it varies from person to person. The goal of treatment is to reduce inflammation and protect the eye from damage.  Contact your health care provider if your symptoms do not get better within 2-3 weeks, or your symptoms get worse. This information is not intended to replace advice given to you by your health care provider. Make sure you discuss any questions you have with your health care provider. Document Released: 11/27/2005 Document Revised: 10/26/2017 Document Reviewed: 01/30/2017 Elsevier Interactive Patient Education  2019 Elsevier Inc.  

## 2019-03-11 NOTE — Progress Notes (Signed)
   Virtual Visit via telephone Note  I connected with Jason Meyer on 03/11/19 at 11:06AM by telephone and verified that I am speaking with the correct person using two identifiers. Jason Meyer is currently located at home and no one is currently with her during visit. The provider, Jannifer Rodney, FNP is located in their office at time of visit.  I discussed the limitations, risks, security and privacy concerns of performing an evaluation and management service by telephone and the availability of in person appointments. I also discussed with the patient that there may be a patient responsible charge related to this service. The patient expressed understanding and agreed to proceed.   History and Present Illness:  HPI Called patient to discuss eye issues. He states since Saturday he noticed his left eye was not "blinking as much as his right eye". He states he has noticed his left lip drooping also. He states his smile is "not even".  He denies any weakness, pain, SOB, or chest pain. His BP has been slightly elevated in 150's/80's. He denies any hx of CVA.    Review of Systems  HENT:       Facial drooping   All other systems reviewed and are negative.      Observations/Objective: Pt sounds slightly nervous, no SOB or distress noted  Assessment and Plan: 1. Facial droop After discussing patient's symptoms, I feel like this is Bell palsy but want him to still be evaluated face to face today in our office to rule out CVA. Appt made for today and he agrees he will be seen. States he is very nervous about [place] sending him to the hospital.       I discussed the assessment and treatment plan with the patient. The patient was provided an opportunity to ask questions and all were answered. The patient agreed with the plan and demonstrated an understanding of the instructions.   The patient was advised to call back or seek an in-person evaluation if the symptoms worsen or if the  condition fails to improve as anticipated.  The above assessment and management plan was discussed with the patient. The patient verbalized understanding of and has agreed to the management plan. Patient is aware to call the clinic if symptoms persist or worsen. Patient is aware when to return to the clinic for a follow-up visit. Patient educated on when it is appropriate to go to the emergency department.    Call ended 11:20 AM, I provided 14 minutes of non-face-to-face time during this encounter.    Jannifer Rodney, FNP

## 2019-04-14 ENCOUNTER — Other Ambulatory Visit: Payer: Self-pay | Admitting: Family Medicine

## 2019-04-23 ENCOUNTER — Telehealth: Payer: Self-pay | Admitting: Family Medicine

## 2019-05-20 ENCOUNTER — Other Ambulatory Visit: Payer: Self-pay

## 2019-05-21 ENCOUNTER — Ambulatory Visit: Payer: BLUE CROSS/BLUE SHIELD | Admitting: Family Medicine

## 2019-05-21 ENCOUNTER — Encounter: Payer: Self-pay | Admitting: Family Medicine

## 2019-05-21 VITALS — BP 155/82 | HR 74 | Temp 98.0°F | Ht 71.0 in | Wt >= 6400 oz

## 2019-05-21 DIAGNOSIS — E1169 Type 2 diabetes mellitus with other specified complication: Secondary | ICD-10-CM | POA: Diagnosis not present

## 2019-05-21 DIAGNOSIS — E785 Hyperlipidemia, unspecified: Secondary | ICD-10-CM

## 2019-05-21 DIAGNOSIS — E139 Other specified diabetes mellitus without complications: Secondary | ICD-10-CM

## 2019-05-21 DIAGNOSIS — B351 Tinea unguium: Secondary | ICD-10-CM

## 2019-05-21 DIAGNOSIS — E1159 Type 2 diabetes mellitus with other circulatory complications: Secondary | ICD-10-CM

## 2019-05-21 DIAGNOSIS — I890 Lymphedema, not elsewhere classified: Secondary | ICD-10-CM

## 2019-05-21 DIAGNOSIS — I10 Essential (primary) hypertension: Secondary | ICD-10-CM | POA: Diagnosis not present

## 2019-05-21 DIAGNOSIS — I152 Hypertension secondary to endocrine disorders: Secondary | ICD-10-CM

## 2019-05-21 LAB — BAYER DCA HB A1C WAIVED: HB A1C (BAYER DCA - WAIVED): 7 % — ABNORMAL HIGH (ref <7.0)

## 2019-05-21 LAB — LIPID PANEL

## 2019-05-21 MED ORDER — TRIAMCINOLONE ACETONIDE 0.5 % EX OINT: TOPICAL_OINTMENT | Freq: Two times a day (BID) | CUTANEOUS | 5 refills | Status: DC

## 2019-05-21 MED ORDER — LISINOPRIL 20 MG PO TABS: 20.0000 mg | ORAL_TABLET | Freq: Every day | ORAL | 3 refills | Status: DC

## 2019-05-21 MED ORDER — AMLODIPINE BESYLATE 10 MG PO TABS: 10.0000 mg | ORAL_TABLET | Freq: Every day | ORAL | 3 refills | Status: DC

## 2019-05-21 MED ORDER — DULAGLUTIDE 1.5 MG/0.5ML ~~LOC~~ SOAJ: SUBCUTANEOUS | 5 refills | Status: DC

## 2019-05-21 MED ORDER — EMPAGLIFLOZIN 10 MG PO TABS: 10.0000 mg | ORAL_TABLET | Freq: Every day | ORAL | 3 refills | Status: DC

## 2019-05-21 MED ORDER — METOPROLOL TARTRATE 100 MG PO TABS: 100.0000 mg | ORAL_TABLET | Freq: Two times a day (BID) | ORAL | 3 refills | Status: DC

## 2019-05-21 MED ORDER — ATORVASTATIN CALCIUM 80 MG PO TABS: 80.0000 mg | ORAL_TABLET | Freq: Every day | ORAL | 3 refills | Status: DC

## 2019-05-21 MED ORDER — METFORMIN HCL 1000 MG PO TABS: 1000.0000 mg | ORAL_TABLET | Freq: Two times a day (BID) | ORAL | 3 refills | Status: DC

## 2019-05-21 MED ORDER — CLOBETASOL PROPIONATE 0.05 % EX OINT: 1.0000 "application " | TOPICAL_OINTMENT | Freq: Two times a day (BID) | CUTANEOUS | 2 refills | Status: DC

## 2019-05-21 NOTE — Progress Notes (Signed)
BP (!) 155/82   Pulse 74   Temp 98 F (36.7 C) (Oral)   Ht 5' 11"  (1.803 m)   Wt (!) 415 lb 12.8 oz (188.6 kg)   BMI 57.99 kg/m    Subjective:   Patient ID: Jason Meyer, male    DOB: [birth year], 58 y.o.   MRN: [id number]  HPI: Jason Meyer is a 58 y.o. male presenting on 05/21/2019 for Diabetes (3 month follow up)   HPI Type 2 diabetes mellitus Patient comes in today for recheck of his diabetes. Patient has been currently taking metformin and Trulicity. Patient is currently on an ACE inhibitor/ARB. Patient has not seen an ophthalmologist this year. Patient has thickened toenails but denies any sores.  Patient has morbid obesity  Hyperlipidemia Patient is coming in for recheck of his hyperlipidemia. The patient is currently taking Lipitor. They deny any issues with myalgias or history of liver damage from it. They deny any focal numbness or weakness or chest pain.   Hypertension Patient is currently on amlodipine and lisinopril and metoprolol, and their blood pressure today is 155/82, will monitor for now the patient has a history of very much noncompliance.. Patient denies any lightheadedness or dizziness. Patient denies headaches, blurred vision, chest pains, shortness of breath, or weakness. Denies any side effects from medication and is content with current medication.   Patient has chronic lymphedema with thickened skin that has some spots that are sore and open, has used triamcinolone on it to help keep it from thickening too much and has helped some but he said he had a stronger one previously would like to go back to that.  Relevant past medical, surgical, family and social history reviewed and updated as indicated. Interim medical history since our last visit reviewed. Allergies and medications reviewed and updated.  Review of Systems  Constitutional: Negative for chills and fever.  Eyes: Negative for discharge.  Respiratory: Negative for shortness of breath and  wheezing.   Cardiovascular: Negative for chest pain and leg swelling.  Musculoskeletal: Negative for back pain and gait problem.  Skin: Positive for color change, rash and wound.  Neurological: Negative for dizziness, weakness and light-headedness.  All other systems reviewed and are negative.   Per HPI unless specifically indicated above   Allergies as of 05/21/2019      Reactions   Penicillins       Medication List       Accurate as of May 21, 2019  8:07 AM. If you have any questions, ask your nurse or doctor.        STOP taking these medications   predniSONE 10 MG tablet Commonly known as:  DELTASONE Stopped by:  Fransisca Kaufmann Dettinger, MD   triamcinolone ointment 0.5 % Commonly known as:  KENALOG Stopped by:  Fransisca Kaufmann Dettinger, MD     TAKE these medications   Accu-Chek Aviva Plus w/Device Kit Test BID Dx E13.9   Accu-Chek Softclix Lancets lancets Test BID Dx E13.9   albuterol (2.5 MG/3ML) 0.083% nebulizer solution Commonly known as:  PROVENTIL Take 3 mLs (2.5 mg total) by nebulization every 4 (four) hours as needed for wheezing or shortness of breath.   amLODipine 10 MG tablet Commonly known as:  NORVASC Take 1 tablet (10 mg total) by mouth daily. (Needs to be seen before next refill)   atorvastatin 80 MG tablet Commonly known as:  LIPITOR Take 1 tablet (80 mg total) by mouth daily.   blood glucose meter kit  and supplies Kit Test BID Dx E13.9   fluticasone 50 MCG/ACT nasal spray Commonly known as:  FLONASE Place 2 sprays into both nostrils daily.   glucose blood test strip Commonly known as:  Accu-Chek Aviva Plus Test BID Dx E13.9   lisinopril 20 MG tablet Commonly known as:  ZESTRIL Take 1 tablet (20 mg total) by mouth daily.   metFORMIN 1000 MG tablet Commonly known as:  GLUCOPHAGE Take 1 tablet (1,000 mg total) by mouth 2 (two) times daily with a meal. (Needs to be seen before next refill for regular med ckup)   metoprolol tartrate 100 MG  tablet Commonly known as:  LOPRESSOR Take 1 tablet (100 mg total) by mouth 2 (two) times daily. (Needs to be seen before next refill)   naproxen 500 MG tablet Commonly known as:  NAPROSYN Take 1 tablet (500 mg total) by mouth 2 (two) times daily with a meal.   sildenafil 20 MG tablet Commonly known as:  Revatio 2-5 pills once daily as needed for erectile dysfunction   Trulicity 1.5 QB/3.4LP Sopn Generic drug:  Dulaglutide INJECT 1.5 MG INTO THE SKIN EVERY 7 (SEVEN) DAYS.        Objective:   BP (!) 155/82   Pulse 74   Temp 98 F (36.7 C) (Oral)   Ht 5' 11"  (1.803 m)   Wt (!) 415 lb 12.8 oz (188.6 kg)   BMI 57.99 kg/m   Wt Readings from Last 3 Encounters:  05/21/19 (!) 415 lb 12.8 oz (188.6 kg)  03/11/19 (!) 407 lb (184.6 kg)  08/07/18 (!) 423 lb 3.2 oz (192 kg)    Physical Exam Vitals signs and nursing note reviewed.  Constitutional:      General: He is not in acute distress.    Appearance: He is well-developed. He is not diaphoretic.  Eyes:     General: No scleral icterus.    Conjunctiva/sclera: Conjunctivae normal.  Neck:     Musculoskeletal: Neck supple.     Thyroid: No thyromegaly.  Cardiovascular:     Rate and Rhythm: Normal rate and regular rhythm.     Heart sounds: Normal heart sounds. No murmur.  Pulmonary:     Effort: Pulmonary effort is normal. No respiratory distress.     Breath sounds: Normal breath sounds. No wheezing.  Musculoskeletal: Normal range of motion.        General: Swelling (Chronic swelling with thickening of bilateral lower extremities) present.  Lymphadenopathy:     Cervical: No cervical adenopathy.  Skin:    General: Skin is warm and dry.     Findings: Rash (Small amount of athlete's foot and fungus starting between the fourth and fifth digits on both feet) present.  Neurological:     Mental Status: He is alert and oriented to person, place, and time.     Coordination: Coordination normal.  Psychiatric:        Behavior:  Behavior normal.    Diabetic Foot Exam - Simple   Simple Foot Form Diabetic Foot exam was performed with the following findings:  Yes 05/21/2019  8:25 AM  Visual Inspection See comments:  Yes Sensation Testing Intact to touch and monofilament testing bilaterally:  Yes Pulse Check Posterior Tibialis and Dorsalis pulse intact bilaterally:  Yes Comments Patient has thickened and yellowed toenails and are starting breakdown into the front of both toes       Assessment & Plan:   Problem List Items Addressed This Visit      Cardiovascular  and Mediastinum   Hypertension associated with diabetes ([place])   Relevant Medications   amLODipine (NORVASC) 10 MG tablet   metoprolol tartrate (LOPRESSOR) 100 MG tablet   atorvastatin (LIPITOR) 80 MG tablet   lisinopril (ZESTRIL) 20 MG tablet   metFORMIN (GLUCOPHAGE) 1000 MG tablet   Dulaglutide (TRULICITY) 1.5 UG/6.4GE SOPN   empagliflozin (JARDIANCE) 10 MG TABS tablet     Endocrine   Diabetes 1.5, 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   Relevant Medications   atorvastatin (LIPITOR) 80 MG tablet   lisinopril (ZESTRIL) 20 MG tablet   metFORMIN (GLUCOPHAGE) 1000 MG tablet   Dulaglutide (TRULICITY) 1.5 FU/0.7KT SOPN   empagliflozin (JARDIANCE) 10 MG TABS tablet   Other Relevant Orders   Bayer DCA Hb A1c Waived   Ambulatory referral to Podiatry   Hyperlipidemia associated with type 2 diabetes mellitus (HCC)   Relevant Medications   atorvastatin (LIPITOR) 80 MG tablet   lisinopril (ZESTRIL) 20 MG tablet   metFORMIN (GLUCOPHAGE) 1000 MG tablet   Dulaglutide (TRULICITY) 1.5 CC/8.8FD SOPN   empagliflozin (JARDIANCE) 10 MG TABS tablet   Other Relevant Orders   Lipid panel     Other   Morbid obesity (HCC)   Relevant Medications   metFORMIN (GLUCOPHAGE) 1000 MG tablet   Dulaglutide (TRULICITY) 1.5 VO/4.5HQ SOPN   empagliflozin (JARDIANCE) 10 MG TABS tablet    Other Visit Diagnoses    Essential hypertension       Relevant Medications    amLODipine (NORVASC) 10 MG tablet   metoprolol tartrate (LOPRESSOR) 100 MG tablet   atorvastatin (LIPITOR) 80 MG tablet   lisinopril (ZESTRIL) 20 MG tablet   Other Relevant Orders   CBC with Differential/Platelet   CMP14+EGFR   Onychomycosis       Concerned about how his toenails are growing and starting to grow into the skin   Relevant Orders   Ambulatory referral to Podiatry   Chronic acquired lymphedema       Relevant Medications   clobetasol ointment (TEMOVATE) 0.05 %   triamcinolone ointment (KENALOG) 0.5 %      Will try to get a prescription for Jardiance and see if it is covered, continue metformin and Trulicity, his last U0Q was 10 6 months ago Follow up plan: Return in about 3 months (around 08/21/2019), or if symptoms worsen or fail to improve, for Diabetes and hypertension and cholesterol.  Counseling provided for all of the vaccine components No orders of the defined types were placed in this encounter.   Caryl Pina, MD [facility] 05/21/2019, 8:07 AM

## 2019-05-22 LAB — CBC WITH DIFFERENTIAL/PLATELET
Basophils Absolute: 0.1 10*3/uL (ref 0.0–0.2)
Basos: 1 %
EOS (ABSOLUTE): 0.2 10*3/uL (ref 0.0–0.4)
Eos: 2 %
Hematocrit: 45.9 % (ref 37.5–51.0)
Hemoglobin: 15.1 g/dL (ref 13.0–17.7)
Immature Grans (Abs): 0.1 10*3/uL (ref 0.0–0.1)
Immature Granulocytes: 1 %
Lymphocytes Absolute: 2.4 10*3/uL (ref 0.7–3.1)
Lymphs: 20 %
MCH: 25.8 pg — ABNORMAL LOW (ref 26.6–33.0)
MCHC: 32.9 g/dL (ref 31.5–35.7)
MCV: 78 fL — ABNORMAL LOW (ref 79–97)
Monocytes Absolute: 0.8 10*3/uL (ref 0.1–0.9)
Monocytes: 6 %
Neutrophils Absolute: 8.3 10*3/uL — ABNORMAL HIGH (ref 1.4–7.0)
Neutrophils: 70 %
Platelets: 298 10*3/uL (ref 150–450)
RBC: 5.86 x10E6/uL — ABNORMAL HIGH (ref 4.14–5.80)
RDW: 15 % (ref 11.6–15.4)
WBC: 11.8 10*3/uL — ABNORMAL HIGH (ref 3.4–10.8)

## 2019-05-22 LAB — CMP14+EGFR
ALT: 29 IU/L (ref 0–44)
AST: 22 IU/L (ref 0–40)
Albumin/Globulin Ratio: 1.7 (ref 1.2–2.2)
Albumin: 4.2 g/dL (ref 3.8–4.9)
Alkaline Phosphatase: 81 IU/L (ref 39–117)
BUN/Creatinine Ratio: 8 — ABNORMAL LOW (ref 9–20)
BUN: 10 mg/dL (ref 6–24)
Bilirubin Total: 0.3 mg/dL (ref 0.0–1.2)
CO2: 23 mmol/L (ref 20–29)
Calcium: 9.7 mg/dL (ref 8.7–10.2)
Chloride: 99 mmol/L (ref 96–106)
Creatinine, Ser: 1.2 mg/dL (ref 0.76–1.27)
GFR calc Af Amer: 78 mL/min/{1.73_m2} (ref >59)
GFR calc non Af Amer: 67 mL/min/{1.73_m2} (ref >59)
Globulin, Total: 2.5 g/dL (ref 1.5–4.5)
Glucose: 150 mg/dL — ABNORMAL HIGH (ref 65–99)
Potassium: 4.8 mmol/L (ref 3.5–5.2)
Sodium: 140 mmol/L (ref 134–144)
Total Protein: 6.7 g/dL (ref 6.0–8.5)

## 2019-05-22 LAB — LIPID PANEL
Chol/HDL Ratio: 6.8 ratio — ABNORMAL HIGH (ref 0.0–5.0)
Cholesterol, Total: 216 mg/dL — ABNORMAL HIGH (ref 100–199)
HDL: 32 mg/dL — ABNORMAL LOW (ref >39)
LDL Calculated: 143 mg/dL — ABNORMAL HIGH (ref 0–99)
Triglycerides: 207 mg/dL — ABNORMAL HIGH (ref 0–149)
VLDL Cholesterol Cal: 41 mg/dL — ABNORMAL HIGH (ref 5–40)

## 2019-06-17 DIAGNOSIS — E1151 Type 2 diabetes mellitus with diabetic peripheral angiopathy without gangrene: Secondary | ICD-10-CM | POA: Diagnosis not present

## 2019-06-17 DIAGNOSIS — M79676 Pain in unspecified toe(s): Secondary | ICD-10-CM | POA: Diagnosis not present

## 2019-06-17 DIAGNOSIS — L84 Corns and callosities: Secondary | ICD-10-CM | POA: Diagnosis not present

## 2019-06-17 DIAGNOSIS — B351 Tinea unguium: Secondary | ICD-10-CM | POA: Diagnosis not present

## 2019-07-01 ENCOUNTER — Other Ambulatory Visit: Payer: Self-pay | Admitting: *Deleted

## 2019-07-01 ENCOUNTER — Other Ambulatory Visit: Payer: Self-pay

## 2019-07-01 MED ORDER — SILDENAFIL CITRATE 20 MG PO TABS: ORAL_TABLET | ORAL | 3 refills | Status: DC

## 2019-07-07 ENCOUNTER — Other Ambulatory Visit: Payer: Self-pay | Admitting: Family Medicine

## 2019-07-07 MED ORDER — SILDENAFIL CITRATE 20 MG PO TABS: ORAL_TABLET | ORAL | 3 refills | Status: DC

## 2019-08-26 ENCOUNTER — Encounter: Payer: Self-pay | Admitting: Family Medicine

## 2019-08-26 ENCOUNTER — Ambulatory Visit (INDEPENDENT_AMBULATORY_CARE_PROVIDER_SITE_OTHER): Payer: BLUE CROSS/BLUE SHIELD | Admitting: Family Medicine

## 2019-08-26 ENCOUNTER — Other Ambulatory Visit: Payer: Self-pay

## 2019-08-26 VITALS — BP 152/83 | HR 80 | Temp 98.4°F | Resp 20 | Ht 71.0 in | Wt >= 6400 oz

## 2019-08-26 DIAGNOSIS — E1169 Type 2 diabetes mellitus with other specified complication: Secondary | ICD-10-CM

## 2019-08-26 DIAGNOSIS — I152 Hypertension secondary to endocrine disorders: Secondary | ICD-10-CM

## 2019-08-26 DIAGNOSIS — E1159 Type 2 diabetes mellitus with other circulatory complications: Secondary | ICD-10-CM | POA: Diagnosis not present

## 2019-08-26 DIAGNOSIS — E785 Hyperlipidemia, unspecified: Secondary | ICD-10-CM

## 2019-08-26 DIAGNOSIS — I10 Essential (primary) hypertension: Secondary | ICD-10-CM

## 2019-08-26 DIAGNOSIS — E139 Other specified diabetes mellitus without complications: Secondary | ICD-10-CM | POA: Diagnosis not present

## 2019-08-26 NOTE — Progress Notes (Signed)
BP (!) 152/83   Pulse 80   Temp 98.4 F (36.9 C)   Resp 20   Ht 5' 11"  (1.803 m)   Wt (!) 408 lb (185.1 kg)   SpO2 98%   BMI 56.90 kg/m    Subjective:   Patient ID: Jason Meyer, male    DOB: [birth year], 58 y.o.   MRN: [id number]  HPI: Jason Meyer is a 58 y.o. male presenting on 08/26/2019 for Medical Management of Chronic Issues (3 mo ), Hypertension, Diabetes, and Hyperlipidemia   HPI Type 2 diabetes mellitus Patient comes in today for recheck of his diabetes. Patient has been currently taking metformin and Trulicity and Jardiance. Patient is currently on an ACE inhibitor/ARB. Patient has not seen an ophthalmologist this year. Patient denies any issues with their feet.  We discussed weight management and obesity  Hypertension Patient is currently on amlodipine and lisinopril and metoprolol, and their blood pressure today is 152/83. Patient denies any lightheadedness or dizziness. Patient denies headaches, blurred vision, chest pains, shortness of breath, or weakness. Denies any side effects from medication and is content with current medication.   Hyperlipidemia Patient is coming in for recheck of his hyperlipidemia. The patient is currently taking Lipitor. They deny any issues with myalgias or history of liver damage from it. They deny any focal numbness or weakness or chest pain.   Relevant past medical, surgical, family and social history reviewed and updated as indicated. Interim medical history since our last visit reviewed. Allergies and medications reviewed and updated.  Review of Systems  Constitutional: Negative for chills and fever.  Respiratory: Negative for shortness of breath and wheezing.   Cardiovascular: Negative for chest pain and leg swelling.  Musculoskeletal: Negative for back pain and gait problem.  Skin: Negative for rash.  Neurological: Negative for dizziness, weakness and numbness.  All other systems reviewed and are negative.   Per HPI  unless specifically indicated above   Allergies as of 08/26/2019      Reactions   Penicillins       Medication List       Accurate as of August 26, 2019 11:59 PM. If you have any questions, ask your nurse or doctor.        STOP taking these medications   Accu-Chek Aviva Plus w/Device Kit Stopped by: Fransisca Kaufmann , MD     TAKE these medications   Accu-Chek Softclix Lancets lancets Test BID Dx E13.9   albuterol (2.5 MG/3ML) 0.083% nebulizer solution Commonly known as: PROVENTIL Take 3 mLs (2.5 mg total) by nebulization every 4 (four) hours as needed for wheezing or shortness of breath.   amLODipine 10 MG tablet Commonly known as: NORVASC Take 1 tablet (10 mg total) by mouth daily. (Needs to be seen before next refill)   atorvastatin 80 MG tablet Commonly known as: LIPITOR Take 1 tablet (80 mg total) by mouth daily.   blood glucose meter kit and supplies Kit Test BID Dx E13.9   clobetasol ointment 0.05 % Commonly known as: TEMOVATE Apply 1 application topically 2 (two) times daily. Do not use for more than 14 days in a row   Dulaglutide 1.5 MG/0.5ML Sopn Commonly known as: Trulicity INJECT 1.5 MG INTO THE SKIN EVERY 7 (SEVEN) DAYS.   empagliflozin 10 MG Tabs tablet Commonly known as: Jardiance Take 10 mg by mouth daily.   fluticasone 50 MCG/ACT nasal spray Commonly known as: FLONASE Place 2 sprays into both nostrils daily.   glucose blood  test strip Commonly known as: Accu-Chek Aviva Plus Test BID Dx E13.9   lisinopril 20 MG tablet Commonly known as: ZESTRIL Take 1 tablet (20 mg total) by mouth daily.   metFORMIN 1000 MG tablet Commonly known as: GLUCOPHAGE Take 1 tablet (1,000 mg total) by mouth 2 (two) times daily with a meal. (Needs to be seen before next refill for regular med ckup)   metoprolol tartrate 100 MG tablet Commonly known as: LOPRESSOR Take 1 tablet (100 mg total) by mouth 2 (two) times daily. (Needs to be seen before next  refill)   naproxen 500 MG tablet Commonly known as: NAPROSYN Take 1 tablet (500 mg total) by mouth 2 (two) times daily with a meal.   sildenafil 20 MG tablet Commonly known as: Revatio 2-5 pills once daily as needed for erectile dysfunction   triamcinolone ointment 0.5 % Commonly known as: KENALOG Apply topically 2 (two) times daily.        Objective:   BP (!) 152/83   Pulse 80   Temp 98.4 F (36.9 C)   Resp 20   Ht 5' 11"  (1.803 m)   Wt (!) 408 lb (185.1 kg)   SpO2 98%   BMI 56.90 kg/m   Wt Readings from Last 3 Encounters:  08/26/19 (!) 408 lb (185.1 kg)  05/21/19 (!) 415 lb 12.8 oz (188.6 kg)  03/11/19 (!) 407 lb (184.6 kg)    Physical Exam Vitals signs and nursing note reviewed.  Constitutional:      General: He is not in acute distress.    Appearance: He is well-developed. He is not diaphoretic.  Eyes:     General: No scleral icterus.    Conjunctiva/sclera: Conjunctivae normal.  Neck:     Musculoskeletal: Neck supple.     Thyroid: No thyromegaly.  Cardiovascular:     Rate and Rhythm: Normal rate and regular rhythm.     Heart sounds: Normal heart sounds. No murmur.  Pulmonary:     Effort: Pulmonary effort is normal. No respiratory distress.     Breath sounds: Normal breath sounds. No wheezing.  Musculoskeletal: Normal range of motion.  Lymphadenopathy:     Cervical: No cervical adenopathy.  Skin:    General: Skin is warm and dry.     Findings: No rash.  Neurological:     Mental Status: He is alert and oriented to person, place, and time.     Coordination: Coordination normal.  Psychiatric:        Behavior: Behavior normal.       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 Follow up plan: Return in about 3 months (around 11/25/2019), or if symptoms worsen or fail to improve, for Diabetes and hypertension and cholesterol.  Counseling provided for all of the vaccine components Orders Placed This Encounter  Procedures  . Bayer DCA Hb A1c Waived  . CBC with Differential/Platelet  . CMP14+EGFR  . Lipid panel    Caryl Pina, MD [facility] 09/01/2019, 9:24 PM

## 2019-08-27 ENCOUNTER — Other Ambulatory Visit: Payer: BLUE CROSS/BLUE SHIELD

## 2019-08-27 DIAGNOSIS — I10 Essential (primary) hypertension: Secondary | ICD-10-CM | POA: Diagnosis not present

## 2019-08-27 DIAGNOSIS — E1159 Type 2 diabetes mellitus with other circulatory complications: Secondary | ICD-10-CM | POA: Diagnosis not present

## 2019-08-27 DIAGNOSIS — E139 Other specified diabetes mellitus without complications: Secondary | ICD-10-CM | POA: Diagnosis not present

## 2019-08-27 DIAGNOSIS — E1169 Type 2 diabetes mellitus with other specified complication: Secondary | ICD-10-CM | POA: Diagnosis not present

## 2019-08-27 LAB — BAYER DCA HB A1C WAIVED: HB A1C (BAYER DCA - WAIVED): 6.8 % (ref <7.0)

## 2019-08-28 LAB — CMP14+EGFR
ALT: 21 IU/L (ref 0–44)
AST: 13 IU/L (ref 0–40)
Albumin/Globulin Ratio: 1.8 (ref 1.2–2.2)
Albumin: 4.5 g/dL (ref 3.8–4.9)
Alkaline Phosphatase: 102 IU/L (ref 39–117)
BUN/Creatinine Ratio: 15 (ref 9–20)
BUN: 16 mg/dL (ref 6–24)
Bilirubin Total: 0.4 mg/dL (ref 0.0–1.2)
CO2: 24 mmol/L (ref 20–29)
Calcium: 9.6 mg/dL (ref 8.7–10.2)
Chloride: 103 mmol/L (ref 96–106)
Creatinine, Ser: 1.05 mg/dL (ref 0.76–1.27)
GFR calc Af Amer: 91 mL/min/{1.73_m2} (ref >59)
GFR calc non Af Amer: 79 mL/min/{1.73_m2} (ref >59)
Globulin, Total: 2.5 g/dL (ref 1.5–4.5)
Glucose: 135 mg/dL — ABNORMAL HIGH (ref 65–99)
Potassium: 4.5 mmol/L (ref 3.5–5.2)
Sodium: 142 mmol/L (ref 134–144)
Total Protein: 7 g/dL (ref 6.0–8.5)

## 2019-08-28 LAB — CBC WITH DIFFERENTIAL/PLATELET
Basophils Absolute: 0.1 10*3/uL (ref 0.0–0.2)
Basos: 1 %
EOS (ABSOLUTE): 0.2 10*3/uL (ref 0.0–0.4)
Eos: 2 %
Hematocrit: 46.7 % (ref 37.5–51.0)
Hemoglobin: 15.1 g/dL (ref 13.0–17.7)
Immature Grans (Abs): 0 10*3/uL (ref 0.0–0.1)
Immature Granulocytes: 0 %
Lymphocytes Absolute: 2.8 10*3/uL (ref 0.7–3.1)
Lymphs: 21 %
MCH: 25.7 pg — ABNORMAL LOW (ref 26.6–33.0)
MCHC: 32.3 g/dL (ref 31.5–35.7)
MCV: 79 fL (ref 79–97)
Monocytes Absolute: 0.8 10*3/uL (ref 0.1–0.9)
Monocytes: 6 %
Neutrophils Absolute: 9.5 10*3/uL — ABNORMAL HIGH (ref 1.4–7.0)
Neutrophils: 70 %
Platelets: 278 10*3/uL (ref 150–450)
RBC: 5.88 x10E6/uL — ABNORMAL HIGH (ref 4.14–5.80)
RDW: 14.5 % (ref 11.6–15.4)
WBC: 13.5 10*3/uL — ABNORMAL HIGH (ref 3.4–10.8)

## 2019-08-28 LAB — LIPID PANEL
Chol/HDL Ratio: 4.9 ratio (ref 0.0–5.0)
Cholesterol, Total: 136 mg/dL (ref 100–199)
HDL: 28 mg/dL — ABNORMAL LOW (ref >39)
LDL Chol Calc (NIH): 85 mg/dL (ref 0–99)
Triglycerides: 126 mg/dL (ref 0–149)
VLDL Cholesterol Cal: 23 mg/dL (ref 5–40)

## 2019-09-16 ENCOUNTER — Other Ambulatory Visit: Payer: Self-pay | Admitting: Family Medicine

## 2019-09-16 DIAGNOSIS — M79676 Pain in unspecified toe(s): Secondary | ICD-10-CM | POA: Diagnosis not present

## 2019-09-16 DIAGNOSIS — B351 Tinea unguium: Secondary | ICD-10-CM | POA: Diagnosis not present

## 2019-10-22 ENCOUNTER — Other Ambulatory Visit: Payer: Self-pay

## 2019-10-22 ENCOUNTER — Telehealth: Payer: Self-pay | Admitting: Family Medicine

## 2019-10-22 ENCOUNTER — Ambulatory Visit (INDEPENDENT_AMBULATORY_CARE_PROVIDER_SITE_OTHER): Payer: BLUE CROSS/BLUE SHIELD | Admitting: Physician Assistant

## 2019-10-22 ENCOUNTER — Encounter: Payer: Self-pay | Admitting: Physician Assistant

## 2019-10-22 DIAGNOSIS — R300 Dysuria: Secondary | ICD-10-CM | POA: Diagnosis not present

## 2019-10-22 DIAGNOSIS — N3000 Acute cystitis without hematuria: Secondary | ICD-10-CM | POA: Diagnosis not present

## 2019-10-22 MED ORDER — CIPROFLOXACIN HCL 500 MG PO TABS: 500.0000 mg | ORAL_TABLET | Freq: Two times a day (BID) | ORAL | 0 refills | Status: DC

## 2019-10-22 NOTE — Telephone Encounter (Signed)
Patient aware he needs to have televisit with provider. Appt scheduled.

## 2019-10-22 NOTE — Telephone Encounter (Signed)
What symptoms do you have? Light headed, urinating frequently no fever but feels hot  How long have you been sick? Started yesterday evening  Have you been seen for this problem? no  If your provider decides to give you a prescription, which pharmacy would you like for it to be sent to? [facility]    Patient informed that this information will be sent to the clinical staff for review and that they should receive a follow up call.

## 2019-10-22 NOTE — Progress Notes (Signed)
Telephone visit  Subjective: CC:UTI PCP: Dettinger, Fransisca Kaufmann, MD VPC:Jason Meyer is a 58 y.o. male calls for telephone consult today. Patient provides verbal consent for consult held via phone.  Patient is identified with 2 separate identifiers.  At this time the entire area is on COVID-19 social distancing and stay home orders are in place.  Patient is of higher risk and therefore we are performing this by a virtual method.  Location of patient: home Location of provider: HOME Others present for call: no  This patient has had several days of dysuria, frequency and nocturia. There is also pain over the bladder in the suprapubic region, no back pain. Denies leakage or hematuria.  Denies fever or chills. No pain in flank area.  He states that the urine had gotten some cloudy.  He is not hurting up into his flank, or having any fever.  In reviewing his chart he has had a history of urinary tract infections in the past.  And he had taken Cipro and tolerated it well.  We will send in that medication.   ROS: Per HPI  Allergies  Allergen Reactions  . Penicillins    Past Medical History:  Diagnosis Date  . Allergy   . Diabetes mellitus without complication ([place])   . Hyperlipidemia   . Hypertension     Current Outpatient Medications:  .  Accu-Chek Softclix Lancets lancets, Test BID Dx E13.9, Disp: 200 each, Rfl: 3 .  albuterol (PROVENTIL) (2.5 MG/3ML) 0.083% nebulizer solution, Take 3 mLs (2.5 mg total) by nebulization every 4 (four) hours as needed for wheezing or shortness of breath., Disp: 75 mL, Rfl: 2 .  amLODipine (NORVASC) 10 MG tablet, Take 1 tablet (10 mg total) by mouth daily. (Needs to be seen before next refill), Disp: 90 tablet, Rfl: 3 .  atorvastatin (LIPITOR) 80 MG tablet, Take 1 tablet (80 mg total) by mouth daily., Disp: 90 tablet, Rfl: 3 .  blood glucose meter kit and supplies KIT, Test BID Dx E13.9, Disp: 1 each, Rfl: 0 .  ciprofloxacin (CIPRO) 500 MG  tablet, Take 1 tablet (500 mg total) by mouth 2 (two) times daily., Disp: 20 tablet, Rfl: 0 .  clobetasol ointment (TEMOVATE) 8.18 %, Apply 1 application topically 2 (two) times daily. Do not use for more than 14 days in a row (Patient not taking: Reported on 08/26/2019), Disp: 60 g, Rfl: 2 .  Dulaglutide (TRULICITY) 1.5 HT/0.9PJ SOPN, INJECT 1.5 MG INTO THE SKIN EVERY 7 (SEVEN) DAYS., Disp: 0.5 mL, Rfl: 5 .  empagliflozin (JARDIANCE) 10 MG TABS tablet, Take 10 mg by mouth daily. (Patient not taking: Reported on 08/26/2019), Disp: 30 tablet, Rfl: 3 .  fluticasone (FLONASE) 50 MCG/ACT nasal spray, Place 2 sprays into both nostrils daily., Disp: 16 g, Rfl: 6 .  glucose blood (ACCU-CHEK AVIVA PLUS) test strip, Test BID Dx E13.9, Disp: 200 each, Rfl: 3 .  lisinopril (ZESTRIL) 20 MG tablet, Take 1 tablet (20 mg total) by mouth daily., Disp: 90 tablet, Rfl: 3 .  metFORMIN (GLUCOPHAGE) 1000 MG tablet, Take 1 tablet (1,000 mg total) by mouth 2 (two) times daily with a meal. (Needs to be seen before next refill for regular med ckup), Disp: 180 tablet, Rfl: 3 .  metoprolol tartrate (LOPRESSOR) 100 MG tablet, Take 1 tablet (100 mg total) by mouth 2 (two) times daily. (Needs to be seen before next refill), Disp: 180 tablet, Rfl: 3 .  naproxen (NAPROSYN) 500 MG tablet, TAKE 1  TABLET (500 MG TOTAL) BY MOUTH 2 (TWO) TIMES DAILY WITH A MEAL., Disp: 60 tablet, Rfl: 5 .  sildenafil (REVATIO) 20 MG tablet, 2-5 pills once daily as needed for erectile dysfunction, Disp: 50 tablet, Rfl: 3 .  triamcinolone ointment (KENALOG) 0.5 %, Apply topically 2 (two) times daily. (Patient not taking: Reported on 08/26/2019), Disp: 60 g, Rfl: 5  Assessment/ Plan: 58 y.o. male   1. Dysuria - ciprofloxacin (CIPRO) 500 MG tablet; Take 1 tablet (500 mg total) by mouth 2 (two) times daily.  Dispense: 20 tablet; Refill: 0  2. Acute cystitis without hematuria - ciprofloxacin (CIPRO) 500 MG tablet; Take 1 tablet (500 mg total) by mouth 2  (two) times daily.  Dispense: 20 tablet; Refill: 0   No follow-ups on file.  Continue all other maintenance medications as listed above.  Start time: 11:52 AM End time: 11:59 AM  Meds ordered this encounter  Medications  . ciprofloxacin (CIPRO) 500 MG tablet    Sig: Take 1 tablet (500 mg total) by mouth 2 (two) times daily.    Dispense:  20 tablet    Refill:  0    Order Specific Question:   Supervising Provider    Answer:   Jason Meyer [[id number]]    Particia Nearing PA-C [place] [phone]

## 2019-10-25 DIAGNOSIS — R0600 Dyspnea, unspecified: Secondary | ICD-10-CM | POA: Diagnosis not present

## 2019-10-25 DIAGNOSIS — J189 Pneumonia, unspecified organism: Secondary | ICD-10-CM | POA: Diagnosis not present

## 2019-10-25 DIAGNOSIS — Z6841 Body Mass Index (BMI) 40.0 and over, adult: Secondary | ICD-10-CM | POA: Diagnosis not present

## 2019-11-26 ENCOUNTER — Ambulatory Visit (INDEPENDENT_AMBULATORY_CARE_PROVIDER_SITE_OTHER): Payer: BLUE CROSS/BLUE SHIELD | Admitting: Family Medicine

## 2019-11-26 ENCOUNTER — Encounter: Payer: Self-pay | Admitting: Family Medicine

## 2019-11-26 DIAGNOSIS — I10 Essential (primary) hypertension: Secondary | ICD-10-CM

## 2019-11-26 DIAGNOSIS — E785 Hyperlipidemia, unspecified: Secondary | ICD-10-CM

## 2019-11-26 DIAGNOSIS — I152 Hypertension secondary to endocrine disorders: Secondary | ICD-10-CM

## 2019-11-26 DIAGNOSIS — E1169 Type 2 diabetes mellitus with other specified complication: Secondary | ICD-10-CM | POA: Diagnosis not present

## 2019-11-26 DIAGNOSIS — E1159 Type 2 diabetes mellitus with other circulatory complications: Secondary | ICD-10-CM | POA: Diagnosis not present

## 2019-11-26 DIAGNOSIS — E139 Other specified diabetes mellitus without complications: Secondary | ICD-10-CM | POA: Diagnosis not present

## 2019-11-26 MED ORDER — JARDIANCE 10 MG PO TABS: 10.0000 mg | ORAL_TABLET | Freq: Every day | ORAL | 5 refills | Status: DC

## 2019-11-26 MED ORDER — DICLOFENAC SODIUM 50 MG PO TBEC: 50.0000 mg | DELAYED_RELEASE_TABLET | Freq: Two times a day (BID) | ORAL | 3 refills | Status: DC

## 2019-11-26 MED ORDER — LISINOPRIL 40 MG PO TABS: 40.0000 mg | ORAL_TABLET | Freq: Every day | ORAL | 3 refills | Status: DC

## 2019-11-26 NOTE — Progress Notes (Signed)
Virtual Visit via telephone Note  I connected with [facility] on 11/26/19 at 0917 by telephone and verified that I am speaking with the correct person using two identifiers. Jason Meyer is currently located at home and no other people are currently with her during visit. The provider, Fransisca Kaufmann Tashe Purdon, MD is located in their office at time of visit.  Call ended at [phone]  I discussed the limitations, risks, security and privacy concerns of performing an evaluation and management service by telephone and the availability of in person appointments. I also discussed with the patient that there may be a patient responsible charge related to this service. The patient expressed understanding and agreed to proceed.   History and Present Illness: Type 2 diabetes mellitus Patient comes in today for recheck of his diabetes. Patient has been currently taking trulicity and jardiance and metformin. Patient is currently on an ACE inhibitor/ARB. Patient has not seen an ophthalmologist this year. Patient denies any issues with their feet.   Hyperlipidemia Patient is coming in for recheck of his hyperlipidemia. The patient is currently taking atorvastatin. They deny any issues with myalgias or history of liver damage from it. They deny any focal numbness or weakness or chest pain.   Hypertension Patient is currently on lisinopril and amlodipine and metoprolol, and their blood pressure today is 160/92- 148/87. Patient denies any lightheadedness or dizziness. Patient denies headaches, blurred vision, chest pains, shortness of breath, or weakness. Denies any side effects from medication and is content with current medication.   No diagnosis found.  Outpatient Encounter Medications as of 11/26/2019  Medication Sig  . Accu-Chek Softclix Lancets lancets Test BID Dx E13.9  . albuterol (PROVENTIL) (2.5 MG/3ML) 0.083% nebulizer solution Take 3 mLs (2.5 mg total) by nebulization every 4 (four) hours as  needed for wheezing or shortness of breath.  Marland Kitchen amLODipine (NORVASC) 10 MG tablet Take 1 tablet (10 mg total) by mouth daily. (Needs to be seen before next refill)  . atorvastatin (LIPITOR) 80 MG tablet Take 1 tablet (80 mg total) by mouth daily.  . blood glucose meter kit and supplies KIT Test BID Dx E13.9  . ciprofloxacin (CIPRO) 500 MG tablet Take 1 tablet (500 mg total) by mouth 2 (two) times daily.  . clobetasol ointment (TEMOVATE) 7.25 % Apply 1 application topically 2 (two) times daily. Do not use for more than 14 days in a row (Patient not taking: Reported on 08/26/2019)  . Dulaglutide (TRULICITY) 1.5 DG/6.4QI SOPN INJECT 1.5 MG INTO THE SKIN EVERY 7 (SEVEN) DAYS.  Marland Kitchen empagliflozin (JARDIANCE) 10 MG TABS tablet Take 10 mg by mouth daily. (Patient not taking: Reported on 08/26/2019)  . fluticasone (FLONASE) 50 MCG/ACT nasal spray Place 2 sprays into both nostrils daily.  Marland Kitchen glucose blood (ACCU-CHEK AVIVA PLUS) test strip Test BID Dx E13.9  . lisinopril (ZESTRIL) 20 MG tablet Take 1 tablet (20 mg total) by mouth daily.  . metFORMIN (GLUCOPHAGE) 1000 MG tablet Take 1 tablet (1,000 mg total) by mouth 2 (two) times daily with a meal. (Needs to be seen before next refill for regular med ckup)  . metoprolol tartrate (LOPRESSOR) 100 MG tablet Take 1 tablet (100 mg total) by mouth 2 (two) times daily. (Needs to be seen before next refill)  . naproxen (NAPROSYN) 500 MG tablet TAKE 1 TABLET (500 MG TOTAL) BY MOUTH 2 (TWO) TIMES DAILY WITH A MEAL.  . sildenafil (REVATIO) 20 MG tablet 2-5 pills once daily as needed for erectile  dysfunction  . triamcinolone ointment (KENALOG) 0.5 % Apply topically 2 (two) times daily. (Patient not taking: Reported on 08/26/2019)   No facility-administered encounter medications on file as of 11/26/2019.    Review of Systems  Constitutional: Negative for chills and fever.  Eyes: Negative for visual disturbance.  Respiratory: Negative for shortness of breath and wheezing.     Cardiovascular: Negative for chest pain and leg swelling.  Musculoskeletal: Negative for back pain and gait problem.  Skin: Negative for rash.  Neurological: Negative for dizziness, weakness and light-headedness.  All other systems reviewed and are negative.   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    I discussed the assessment and treatment plan with the patient. The patient was provided an opportunity to ask questions and all were answered. The patient agreed with the plan and demonstrated an understanding of the instructions.   The patient was advised to call back or seek an in-person evaluation if the symptoms worsen or if the condition fails to improve as anticipated.  The above assessment and management plan was discussed with the patient. The patient verbalized understanding of and has agreed to the management plan. Patient is aware to call the clinic if symptoms persist or worsen. Patient is aware when to return to the clinic for a follow-up visit. Patient educated on when it is appropriate to go to the emergency department.    I provided 12 minutes of non-face-to-face time during  this encounter.    Worthy Rancher, MD

## 2020-01-31 DIAGNOSIS — B0239 Other herpes zoster eye disease: Secondary | ICD-10-CM | POA: Diagnosis not present

## 2020-01-31 DIAGNOSIS — Z6841 Body Mass Index (BMI) 40.0 and over, adult: Secondary | ICD-10-CM | POA: Diagnosis not present

## 2020-03-11 ENCOUNTER — Ambulatory Visit: Payer: Self-pay | Attending: Internal Medicine

## 2020-03-11 DIAGNOSIS — Z23 Encounter for immunization: Secondary | ICD-10-CM

## 2020-03-11 NOTE — Progress Notes (Signed)
   Covid-19 Vaccination Clinic  Name:  Jason Meyer    MRN: [id number] DOB: 04-Apr-1961  03/11/2020  [name] was observed post Covid-19 immunization for 15 minutes without incident. He was provided with Vaccine Information Sheet and instruction to access the V-Safe system.   [name] was instructed to call 911 with any severe reactions post vaccine: Marland Kitchen Difficulty breathing  . Swelling of face and throat  . A fast heartbeat  . A bad rash all over body  . Dizziness and weakness   Immunizations Administered    Name Date Dose VIS Date Route   Moderna COVID-19 Vaccine 03/11/2020 10:56 AM 0.5 mL 11/11/2019 Intramuscular   Manufacturer: Moderna   Lot: [id number]   NDC: 76147-092-95

## 2020-04-08 ENCOUNTER — Ambulatory Visit: Payer: Self-pay

## 2020-04-15 ENCOUNTER — Ambulatory Visit: Payer: Self-pay | Attending: Internal Medicine

## 2020-04-15 DIAGNOSIS — Z23 Encounter for immunization: Secondary | ICD-10-CM

## 2020-04-15 NOTE — Progress Notes (Signed)
   Covid-19 Vaccination Clinic  Name:  Jlen Wintle Bankson    MRN: [id number] DOB: 1961/10/20  04/15/2020  Mr. Haislip was observed post Covid-19 immunization for 15 minutes without incident. He was provided with Vaccine Information Sheet and instruction to access the V-Safe system.   Mr. Niazi was instructed to call 911 with any severe reactions post vaccine: Marland Kitchen Difficulty breathing  . Swelling of face and throat  . A fast heartbeat  . A bad rash all over body  . Dizziness and weakness   Immunizations Administered    Name Date Dose VIS Date Route   Moderna COVID-19 Vaccine 04/15/2020 10:30 AM 0.5 mL 11/2019 Intramuscular   Manufacturer: Moderna   Lot: [id number]   NDC: 15726-203-55

## 2020-04-22 ENCOUNTER — Other Ambulatory Visit: Payer: Self-pay | Admitting: Family Medicine

## 2020-05-16 ENCOUNTER — Other Ambulatory Visit: Payer: Self-pay | Admitting: Family Medicine

## 2020-05-17 NOTE — Telephone Encounter (Signed)
Patient aware that he needs to be seen. Appointment scheduled 06/30 @ 4:10pm

## 2020-05-17 NOTE — Telephone Encounter (Signed)
Dettinger. NTBS 30 days given 04/22/20

## 2020-06-09 ENCOUNTER — Ambulatory Visit (INDEPENDENT_AMBULATORY_CARE_PROVIDER_SITE_OTHER): Payer: BLUE CROSS/BLUE SHIELD | Admitting: Family Medicine

## 2020-06-09 ENCOUNTER — Other Ambulatory Visit: Payer: Self-pay

## 2020-06-09 ENCOUNTER — Encounter: Payer: Self-pay | Admitting: Family Medicine

## 2020-06-09 VITALS — BP 152/87 | HR 70 | Temp 98.7°F | Ht 71.0 in | Wt >= 6400 oz

## 2020-06-09 DIAGNOSIS — E1159 Type 2 diabetes mellitus with other circulatory complications: Secondary | ICD-10-CM

## 2020-06-09 DIAGNOSIS — E785 Hyperlipidemia, unspecified: Secondary | ICD-10-CM

## 2020-06-09 DIAGNOSIS — E119 Type 2 diabetes mellitus without complications: Secondary | ICD-10-CM | POA: Diagnosis not present

## 2020-06-09 DIAGNOSIS — E1169 Type 2 diabetes mellitus with other specified complication: Secondary | ICD-10-CM

## 2020-06-09 DIAGNOSIS — I152 Hypertension secondary to endocrine disorders: Secondary | ICD-10-CM

## 2020-06-09 DIAGNOSIS — I10 Essential (primary) hypertension: Secondary | ICD-10-CM

## 2020-06-09 DIAGNOSIS — E139 Other specified diabetes mellitus without complications: Secondary | ICD-10-CM | POA: Diagnosis not present

## 2020-06-09 LAB — BAYER DCA HB A1C WAIVED: HB A1C (BAYER DCA - WAIVED): 7.2 % — ABNORMAL HIGH (ref <7.0)

## 2020-06-09 NOTE — Progress Notes (Signed)
BP (!) 152/87   Pulse 70   Temp 98.7 F (37.1 C)   Ht 5' 11"  (1.803 m)   Wt (!) 400 lb (181.4 kg)   SpO2 95%   BMI 55.79 kg/m    Subjective:   Patient ID: Jason Meyer, male    DOB: [birth year], 59 y.o.   MRN: [id number]  HPI: Jason Meyer is a 59 y.o. male presenting on 06/09/2020 for Medical Management of Chronic Issues and Diabetes   HPI Type 2 diabetes mellitus Patient comes in today for recheck of his diabetes. Patient has been currently taking Trulicity and Jardiance and Metformin. Patient is currently on an ACE inhibitor/ARB. Patient has not seen an ophthalmologist this year. Patient denies any issues with their feet. The symptom started onset as an adult hypertension and hyperlipidemia ARE RELATED TO DM   Hypertension Patient is currently on amlodipine and lisinopril and metoprolol, and their blood pressure today is 152/87. Patient denies any lightheadedness or dizziness. Patient denies headaches, blurred vision, chest pains, shortness of breath, or weakness. Denies any side effects from medication and is content with current medication.   Hyperlipidemia Patient is coming in for recheck of his hyperlipidemia. The patient is currently taking atorvastatin. They deny any issues with myalgias or history of liver damage from it. They deny any focal numbness or weakness or chest pain.   Relevant past medical, surgical, family and social history reviewed and updated as indicated. Interim medical history since our last visit reviewed. Allergies and medications reviewed and updated.  Review of Systems  Constitutional: Negative for chills and fever.  Eyes: Negative for visual disturbance.  Respiratory: Negative for shortness of breath and wheezing.   Cardiovascular: Negative for chest pain and leg swelling.  Musculoskeletal: Negative for back pain and gait problem.  Skin: Negative for rash.  Neurological: Negative for dizziness, weakness, light-headedness and numbness.    All other systems reviewed and are negative.   Per HPI unless specifically indicated above   Allergies as of 06/09/2020      Reactions   Penicillins       Medication List       Accurate as of June 09, 2020  4:30 PM. If you have any questions, ask your nurse or doctor.        Accu-Chek Softclix Lancets lancets Test BID Dx E13.9   albuterol (2.5 MG/3ML) 0.083% nebulizer solution Commonly known as: PROVENTIL Take 3 mLs (2.5 mg total) by nebulization every 4 (four) hours as needed for wheezing or shortness of breath.   amLODipine 10 MG tablet Commonly known as: NORVASC Take 1 tablet (10 mg total) by mouth daily. (Needs to be seen before next refill)   atorvastatin 80 MG tablet Commonly known as: LIPITOR Take 1 tablet (80 mg total) by mouth daily.   blood glucose meter kit and supplies Kit Test BID Dx E13.9   ciprofloxacin 500 MG tablet Commonly known as: Cipro Take 1 tablet (500 mg total) by mouth 2 (two) times daily.   clobetasol ointment 0.05 % Commonly known as: TEMOVATE Apply 1 application topically 2 (two) times daily. Do not use for more than 14 days in a row   diclofenac 50 MG EC tablet Commonly known as: VOLTAREN Take 1 tablet (50 mg total) by mouth 2 (two) times daily. (Needs to be seen before next refill)   Dulaglutide 1.5 MG/0.5ML Sopn Commonly known as: Trulicity INJECT 1.5 MG INTO THE SKIN EVERY 7 (SEVEN) DAYS.   fluticasone 50 MCG/ACT  nasal spray Commonly known as: FLONASE Place 2 sprays into both nostrils daily.   glucose blood test strip Commonly known as: Accu-Chek Aviva Plus Test BID Dx E13.9   Jardiance 10 MG Tabs tablet Generic drug: empagliflozin Take 10 mg by mouth daily.   lisinopril 40 MG tablet Commonly known as: ZESTRIL Take 1 tablet (40 mg total) by mouth daily.   metFORMIN 1000 MG tablet Commonly known as: GLUCOPHAGE Take 1 tablet (1,000 mg total) by mouth 2 (two) times daily with a meal. (Needs to be seen before next  refill for regular med ckup)   metoprolol tartrate 100 MG tablet Commonly known as: LOPRESSOR Take 1 tablet (100 mg total) by mouth 2 (two) times daily. (Needs to be seen before next refill)   naproxen 500 MG tablet Commonly known as: NAPROSYN TAKE 1 TABLET (500 MG TOTAL) BY MOUTH 2 (TWO) TIMES DAILY WITH A MEAL.   sildenafil 20 MG tablet Commonly known as: Revatio 2-5 pills once daily as needed for erectile dysfunction   triamcinolone ointment 0.5 % Commonly known as: KENALOG Apply topically 2 (two) times daily.        Objective:   There were no vitals taken for this visit.  Wt Readings from Last 3 Encounters:  08/26/19 (!) 408 lb (185.1 kg)  05/21/19 (!) 415 lb 12.8 oz (188.6 kg)  03/11/19 (!) 407 lb (184.6 kg)    Physical Exam Vitals and nursing note reviewed.  Constitutional:      General: He is not in acute distress.    Appearance: He is well-developed. He is not diaphoretic.  Eyes:     General: No scleral icterus.    Conjunctiva/sclera: Conjunctivae normal.  Neck:     Thyroid: No thyromegaly.  Cardiovascular:     Rate and Rhythm: Normal rate and regular rhythm.     Heart sounds: Normal heart sounds. No murmur heard.   Pulmonary:     Effort: Pulmonary effort is normal. No respiratory distress.     Breath sounds: Normal breath sounds. No wheezing.  Musculoskeletal:        General: Normal range of motion.     Cervical back: Neck supple.  Lymphadenopathy:     Cervical: No cervical adenopathy.  Skin:    General: Skin is warm and dry.     Findings: No rash.  Neurological:     Mental Status: He is alert and oriented to person, place, and time.     Coordination: Coordination normal.  Psychiatric:        Behavior: Behavior normal.       Assessment & Plan:   Problem List Items Addressed This Visit      Cardiovascular and Mediastinum   Hypertension associated with diabetes ([place])     Endocrine   Diabetes 1.5, managed as type 2 ([place])   Relevant  Orders   CBC with Differential/Platelet   Lipid panel   Hyperlipidemia associated with type 2 diabetes mellitus ([place])   Relevant Orders   CMP14+EGFR     Other   Morbid obesity ([place])   Relevant Orders   Lipid panel    Other Visit Diagnoses    Diabetes mellitus without complication ([place])    -  Primary   Relevant Orders   Bayer DCA Hb A1c Waived   CMP14+EGFR   Lipid panel      Patient's A1c is 7.2 which is up but not as bad as we thought, recommend diet and exercise, no medication change at this point,  recheck in 3 months  Blood pressure up today but he did run out of some of his medicines so we will send refills for all we will see where he is at the next visit. Follow up plan: Return in about 3 months (around 09/09/2020), or if symptoms worsen or fail to improve, for Diabetes and hypertension and cholesterol.  Counseling provided for all of the vaccine components Orders Placed This Encounter  Procedures  . [facility] Hb A1c [place], MD [facility] 06/09/2020, 4:30 PM

## 2020-06-10 LAB — CBC WITH DIFFERENTIAL/PLATELET
Basophils Absolute: 0.1 10*3/uL (ref 0.0–0.2)
Basos: 1 %
EOS (ABSOLUTE): 0.4 10*3/uL (ref 0.0–0.4)
Eos: 3 %
Hematocrit: 48.9 % (ref 37.5–51.0)
Hemoglobin: 16.1 g/dL (ref 13.0–17.7)
Immature Grans (Abs): 0 10*3/uL (ref 0.0–0.1)
Immature Granulocytes: 0 %
Lymphocytes Absolute: 2.6 10*3/uL (ref 0.7–3.1)
Lymphs: 25 %
MCH: 26.4 pg — ABNORMAL LOW (ref 26.6–33.0)
MCHC: 32.9 g/dL (ref 31.5–35.7)
MCV: 80 fL (ref 79–97)
Monocytes Absolute: 0.8 10*3/uL (ref 0.1–0.9)
Monocytes: 8 %
Neutrophils Absolute: 6.6 10*3/uL (ref 1.4–7.0)
Neutrophils: 63 %
Platelets: 289 10*3/uL (ref 150–450)
RBC: 6.11 x10E6/uL — ABNORMAL HIGH (ref 4.14–5.80)
RDW: 15.2 % (ref 11.6–15.4)
WBC: 10.5 10*3/uL (ref 3.4–10.8)

## 2020-06-10 LAB — LIPID PANEL
Chol/HDL Ratio: 6.9 ratio — ABNORMAL HIGH (ref 0.0–5.0)
Cholesterol, Total: 234 mg/dL — ABNORMAL HIGH (ref 100–199)
HDL: 34 mg/dL — ABNORMAL LOW (ref >39)
LDL Chol Calc (NIH): 170 mg/dL — ABNORMAL HIGH (ref 0–99)
Triglycerides: 163 mg/dL — ABNORMAL HIGH (ref 0–149)
VLDL Cholesterol Cal: 30 mg/dL (ref 5–40)

## 2020-06-10 LAB — CMP14+EGFR
ALT: 49 IU/L — ABNORMAL HIGH (ref 0–44)
AST: 33 IU/L (ref 0–40)
Albumin/Globulin Ratio: 1.6 (ref 1.2–2.2)
Albumin: 4.3 g/dL (ref 3.8–4.9)
Alkaline Phosphatase: 90 IU/L (ref 48–121)
BUN/Creatinine Ratio: 9 (ref 9–20)
BUN: 11 mg/dL (ref 6–24)
Bilirubin Total: 0.3 mg/dL (ref 0.0–1.2)
CO2: 26 mmol/L (ref 20–29)
Calcium: 9.7 mg/dL (ref 8.7–10.2)
Chloride: 102 mmol/L (ref 96–106)
Creatinine, Ser: 1.2 mg/dL (ref 0.76–1.27)
GFR calc Af Amer: 77 mL/min/{1.73_m2} (ref >59)
GFR calc non Af Amer: 67 mL/min/{1.73_m2} (ref >59)
Globulin, Total: 2.7 g/dL (ref 1.5–4.5)
Glucose: 133 mg/dL — ABNORMAL HIGH (ref 65–99)
Potassium: 4 mmol/L (ref 3.5–5.2)
Sodium: 141 mmol/L (ref 134–144)
Total Protein: 7 g/dL (ref 6.0–8.5)

## 2020-06-14 ENCOUNTER — Other Ambulatory Visit: Payer: Self-pay | Admitting: Family Medicine

## 2020-06-14 DIAGNOSIS — E1169 Type 2 diabetes mellitus with other specified complication: Secondary | ICD-10-CM

## 2020-06-18 ENCOUNTER — Other Ambulatory Visit: Payer: Self-pay | Admitting: Family Medicine

## 2020-06-18 DIAGNOSIS — E139 Other specified diabetes mellitus without complications: Secondary | ICD-10-CM

## 2020-06-29 ENCOUNTER — Encounter: Payer: Self-pay | Admitting: Nurse Practitioner

## 2020-06-29 ENCOUNTER — Telehealth: Payer: Self-pay | Admitting: Family Medicine

## 2020-06-29 ENCOUNTER — Ambulatory Visit (INDEPENDENT_AMBULATORY_CARE_PROVIDER_SITE_OTHER): Payer: BLUE CROSS/BLUE SHIELD | Admitting: Nurse Practitioner

## 2020-06-29 DIAGNOSIS — N3000 Acute cystitis without hematuria: Secondary | ICD-10-CM

## 2020-06-29 DIAGNOSIS — R300 Dysuria: Secondary | ICD-10-CM | POA: Diagnosis not present

## 2020-06-29 LAB — MICROSCOPIC EXAMINATION: Renal Epithel, UA: NONE SEEN /hpf

## 2020-06-29 LAB — URINALYSIS, COMPLETE
Bilirubin, UA: NEGATIVE
Nitrite, UA: NEGATIVE
Specific Gravity, UA: 1.01 (ref 1.005–1.030)
Urobilinogen, Ur: 0.2 mg/dL (ref 0.2–1.0)
pH, UA: 7 (ref 5.0–7.5)

## 2020-06-29 MED ORDER — SULFAMETHOXAZOLE-TRIMETHOPRIM 800-160 MG PO TABS: 1.0000 | ORAL_TABLET | Freq: Two times a day (BID) | ORAL | 0 refills | Status: DC

## 2020-06-29 NOTE — Telephone Encounter (Signed)
Patient aware of recommendations and letter written and advised ready for pick up

## 2020-06-29 NOTE — Progress Notes (Signed)
   Virtual Visit via telephone Note Due to COVID-19 pandemic this visit was conducted virtually. This visit type was conducted due to national recommendations for restrictions regarding the COVID-19 Pandemic (e.g. social distancing, sheltering in place) in an effort to limit this patient's exposure and mitigate transmission in our community. All issues noted in this document were discussed and addressed.  A physical exam was not performed with this format.  I connected with Jason Meyer on 06/29/20 at 1:25 by telephone and verified that I am speaking with the correct person using two identifiers. Jason Meyer is currently located at home and no one is currently with him during visit. The provider, Mary-Margaret Daphine Deutscher, FNP is located in their office at time of visit.  I discussed the limitations, risks, security and privacy concerns of performing an evaluation and management service by telephone and the availability of in person appointments. I also discussed with the patient that there may be a patient responsible charge related to this service. The patient expressed understanding and agreed to proceed.   History and Present Illness:  Patient calls in stating that Sunday evening he became in continent. Was not able to hold his urine. Sight burning. He started drinking a lot of water and is some better. He brought a urine in to be read.   Review of Systems  Respiratory: Negative.   Cardiovascular: Negative.   Genitourinary: Positive for dysuria, frequency and urgency. Negative for flank pain and hematuria.  All other systems reviewed and are negative.    Observations/Objective: Alert and oriented- answers all questions appropriately No distress    Assessment and Plan: Jason Meyer in today with chief complaint of Urinary Tract Infection   1. Dysuria - Urinalysis, Complete - Urine Culture; Future  2. Acute cystitis without hematuria Take medication as prescribe Cotton  underwear Take shower not bath Cranberry juice, yogurt Force fluids AZO over the counter X2 days Culture pending RTO prn  - sulfamethoxazole-trimethoprim (BACTRIM DS) 800-160 MG tablet; Take 1 tablet by mouth 2 (two) times daily.  Dispense: 20 tablet; Refill: 0     Follow Up Instructions: prn    I discussed the assessment and treatment plan with the patient. The patient was provided an opportunity to ask questions and all were answered. The patient agreed with the plan and demonstrated an understanding of the instructions.   The patient was advised to call back or seek an in-person evaluation if the symptoms worsen or if the condition fails to improve as anticipated.  The above assessment and management plan was discussed with the patient. The patient verbalized understanding of and has agreed to the management plan. Patient is aware to call the clinic if symptoms persist or worsen. Patient is aware when to return to the clinic for a follow-up visit. Patient educated on when it is appropriate to go to the emergency department.   Time call ended:  1:40 I provided 15 minutes of non-face-to-face time during this encounter.    Mary-Margaret Daphine Deutscher, FNP

## 2020-06-29 NOTE — Telephone Encounter (Signed)
OK for note to go back on thursday- he does not need to be aout a week for a uti

## 2020-06-30 DIAGNOSIS — N419 Inflammatory disease of prostate, unspecified: Secondary | ICD-10-CM | POA: Diagnosis not present

## 2020-06-30 DIAGNOSIS — I517 Cardiomegaly: Secondary | ICD-10-CM | POA: Diagnosis not present

## 2020-06-30 DIAGNOSIS — R300 Dysuria: Secondary | ICD-10-CM | POA: Diagnosis not present

## 2020-06-30 DIAGNOSIS — R0602 Shortness of breath: Secondary | ICD-10-CM | POA: Diagnosis not present

## 2020-06-30 DIAGNOSIS — R351 Nocturia: Secondary | ICD-10-CM | POA: Diagnosis not present

## 2020-07-01 LAB — URINE CULTURE

## 2020-07-11 ENCOUNTER — Other Ambulatory Visit: Payer: Self-pay | Admitting: Family Medicine

## 2020-07-11 DIAGNOSIS — E139 Other specified diabetes mellitus without complications: Secondary | ICD-10-CM

## 2020-07-11 DIAGNOSIS — I10 Essential (primary) hypertension: Secondary | ICD-10-CM

## 2020-07-13 DIAGNOSIS — E1151 Type 2 diabetes mellitus with diabetic peripheral angiopathy without gangrene: Secondary | ICD-10-CM | POA: Diagnosis not present

## 2020-07-13 DIAGNOSIS — M79676 Pain in unspecified toe(s): Secondary | ICD-10-CM | POA: Diagnosis not present

## 2020-07-13 DIAGNOSIS — B351 Tinea unguium: Secondary | ICD-10-CM | POA: Diagnosis not present

## 2020-09-06 ENCOUNTER — Other Ambulatory Visit: Payer: Self-pay | Admitting: Family Medicine

## 2020-09-06 DIAGNOSIS — E785 Hyperlipidemia, unspecified: Secondary | ICD-10-CM

## 2020-09-06 DIAGNOSIS — E1169 Type 2 diabetes mellitus with other specified complication: Secondary | ICD-10-CM

## 2020-09-15 ENCOUNTER — Encounter: Payer: Self-pay | Admitting: Family Medicine

## 2020-09-15 ENCOUNTER — Ambulatory Visit (INDEPENDENT_AMBULATORY_CARE_PROVIDER_SITE_OTHER): Payer: BLUE CROSS/BLUE SHIELD | Admitting: Family Medicine

## 2020-09-15 ENCOUNTER — Other Ambulatory Visit: Payer: Self-pay

## 2020-09-15 VITALS — BP 144/80 | HR 64 | Temp 98.0°F | Ht 71.0 in | Wt 386.0 lb

## 2020-09-15 DIAGNOSIS — E785 Hyperlipidemia, unspecified: Secondary | ICD-10-CM

## 2020-09-15 DIAGNOSIS — E139 Other specified diabetes mellitus without complications: Secondary | ICD-10-CM

## 2020-09-15 DIAGNOSIS — E1159 Type 2 diabetes mellitus with other circulatory complications: Secondary | ICD-10-CM | POA: Diagnosis not present

## 2020-09-15 DIAGNOSIS — R609 Edema, unspecified: Secondary | ICD-10-CM

## 2020-09-15 DIAGNOSIS — I152 Hypertension secondary to endocrine disorders: Secondary | ICD-10-CM | POA: Diagnosis not present

## 2020-09-15 DIAGNOSIS — E1169 Type 2 diabetes mellitus with other specified complication: Secondary | ICD-10-CM

## 2020-09-15 LAB — BAYER DCA HB A1C WAIVED: HB A1C (BAYER DCA - WAIVED): 8 % — ABNORMAL HIGH (ref <7.0)

## 2020-09-15 MED ORDER — POTASSIUM CHLORIDE ER 10 MEQ PO CPCR: 10.0000 meq | ORAL_CAPSULE | Freq: Every day | ORAL | 3 refills | Status: DC | PRN

## 2020-09-15 MED ORDER — FUROSEMIDE 20 MG PO TABS: 20.0000 mg | ORAL_TABLET | Freq: Every day | ORAL | 3 refills | Status: DC | PRN

## 2020-09-15 NOTE — Progress Notes (Signed)
BP (!) 144/80   Pulse 64   Temp 98 F (36.7 C)   Ht 5' 11"  (1.803 m)   Wt (!) 386 lb (175.1 kg)   SpO2 95%   BMI 53.84 kg/m    Subjective:   Patient ID: Jason Meyer, male    DOB: 09/04/1961, 59 y.o.   MRN: [id number]  HPI: Jason Meyer is a 59 y.o. male presenting on 09/15/2020 for Medical Management of Chronic Issues and Diabetes   HPI Type 2 diabetes mellitus Patient comes in today for recheck of his diabetes. Patient has been currently taking Trulicity and Jardiance and Metformin. Patient is currently on an ACE inhibitor/ARB. Patient has not seen an ophthalmologist this year. Patient denies any issues with their feet. The symptom started onset as an adult hypertension and hyperlipidemia ARE RELATED TO DM   Hypertension Patient is currently on amlodipine and lisinopril and metoprolol, and their blood pressure today is 144/80. Patient denies any lightheadedness or dizziness. Patient denies headaches, blurred vision, chest pains, shortness of breath, or weakness. Denies any side effects from medication and is content with current medication.   Hyperlipidemia Patient is coming in for recheck of his hyperlipidemia. The patient is currently taking atorvastatin. They deny any issues with myalgias or history of liver damage from it. They deny any focal numbness or weakness or chest pain.   Patient's hypertension and diabetes and hyperlipidemia are more complicated by the patient's morbid obesity.  Discussed weight loss and lifestyle modification and exercise with the patient.   Patient comes in complaining of a lot of swelling in bilateral lower extremities still, and he said it did get improved when he took some diuretics that they gave him from the urgent care but then has come back.  He is wondering if he can get a few days worth of diuretics to help him through getting some of the fluid off.  Relevant past medical, surgical, family and social history reviewed and updated as  indicated. Interim medical history since our last visit reviewed. Allergies and medications reviewed and updated.  Review of Systems  Constitutional: Negative for chills and fever.  Eyes: Negative for visual disturbance.  Respiratory: Negative for shortness of breath and wheezing.   Cardiovascular: Positive for leg swelling. Negative for chest pain.  Musculoskeletal: Negative for arthralgias, back pain and gait problem.  Skin: Negative for rash.  Neurological: Negative for dizziness, weakness and light-headedness.  All other systems reviewed and are negative.   Per HPI unless specifically indicated above   Allergies as of 09/15/2020      Reactions   Penicillins       Medication List       Accurate as of September 15, 2020 11:50 AM. If you have any questions, ask your nurse or doctor.        STOP taking these medications   naproxen 500 MG tablet Commonly known as: NAPROSYN Stopped by: Fransisca Kaufmann Anyia Gierke, MD   sulfamethoxazole-trimethoprim 800-160 MG tablet Commonly known as: Bactrim DS Stopped by: Fransisca Kaufmann Colbert Curenton, MD     TAKE these medications   Accu-Chek Softclix Lancets lancets Test BID Dx E13.9   albuterol (2.5 MG/3ML) 0.083% nebulizer solution Commonly known as: PROVENTIL Take 3 mLs (2.5 mg total) by nebulization every 4 (four) hours as needed for wheezing or shortness of breath.   amLODipine 10 MG tablet Commonly known as: NORVASC Take 1 tablet (10 mg total) by mouth daily.   atorvastatin 80 MG tablet Commonly known  as: LIPITOR TAKE 1 TABLET BY MOUTH EVERY DAY   blood glucose meter kit and supplies Kit Test BID Dx E13.9   diclofenac 50 MG EC tablet Commonly known as: VOLTAREN Take 1 tablet (50 mg total) by mouth 2 (two) times daily.   fluticasone 50 MCG/ACT nasal spray Commonly known as: FLONASE Place 2 sprays into both nostrils daily.   furosemide 20 MG tablet Commonly known as: LASIX Take 1 tablet (20 mg total) by mouth daily as needed for  fluid or edema. Started by: Fransisca Kaufmann Keondria Siever, MD   glucose blood test strip Commonly known as: Accu-Chek Aviva Plus Test BID Dx E13.9   Jardiance 10 MG Tabs tablet Generic drug: empagliflozin Take 10 mg by mouth daily.   lisinopril 40 MG tablet Commonly known as: ZESTRIL Take 1 tablet (40 mg total) by mouth daily.   metFORMIN 1000 MG tablet Commonly known as: GLUCOPHAGE Take 1 tablet (1,000 mg total) by mouth 2 (two) times daily with a meal.   metoprolol tartrate 100 MG tablet Commonly known as: LOPRESSOR Take 1 tablet (100 mg total) by mouth 2 (two) times daily.   potassium chloride 10 MEQ CR capsule Commonly known as: MICRO-K Take 1 capsule (10 mEq total) by mouth daily as needed. Started by: Worthy Rancher, MD   sildenafil 20 MG tablet Commonly known as: Revatio 2-5 pills once daily as needed for erectile dysfunction   triamcinolone ointment 0.5 % Commonly known as: KENALOG Apply topically 2 (two) times daily.   Trulicity 1.5 WU/9.8JX Sopn Generic drug: Dulaglutide INJECT 1.5 MG INTO THE SKIN EVERY 7 (SEVEN) DAYS.        Objective:   BP (!) 144/80   Pulse 64   Temp 98 F (36.7 C)   Ht 5' 11"  (1.803 m)   Wt (!) 386 lb (175.1 kg)   SpO2 95%   BMI 53.84 kg/m   Wt Readings from Last 3 Encounters:  09/15/20 (!) 386 lb (175.1 kg)  06/09/20 (!) 400 lb (181.4 kg)  08/26/19 (!) 408 lb (185.1 kg)    Physical Exam Vitals and nursing note reviewed.  Constitutional:      General: He is not in acute distress.    Appearance: He is well-developed. He is not diaphoretic.  Eyes:     General: No scleral icterus.    Conjunctiva/sclera: Conjunctivae normal.  Neck:     Thyroid: No thyromegaly.  Cardiovascular:     Rate and Rhythm: Normal rate and regular rhythm.     Heart sounds: Normal heart sounds. No murmur heard.   Pulmonary:     Effort: Pulmonary effort is normal. No respiratory distress.     Breath sounds: Normal breath sounds. No wheezing.    Musculoskeletal:        General: Swelling (Bilateral lower extremity edema) present.     Cervical back: Neck supple.  Lymphadenopathy:     Cervical: No cervical adenopathy.  Skin:    General: Skin is warm and dry.     Findings: No rash.  Neurological:     Mental Status: He is alert and oriented to person, place, and time.     Coordination: Coordination normal.  Psychiatric:        Behavior: Behavior normal.       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  Also give potassium when he takes it.  Patient's A1c is up to 8.0, last it was 7.1, does admit that is been doing a lot of work cakes and things like that recently and that has kicked it up.  He wants to try diet and exercise before adding any medicine at this point, will give him 3 months and if not improved then we will increase and adjust medications. Follow up plan: Return in about 3 months (around 12/16/2020), or if symptoms worsen or fail to improve, for Diabetes recheck.  Counseling provided for all of the vaccine components Orders Placed This Encounter  Procedures  . Bayer DCA Hb A1c Waived   . CBC with Differential/Platelet  . CMP14+EGFR  . Lipid panel    Caryl Pina, MD [facility] 09/15/2020, 11:50 AM

## 2020-09-16 LAB — CMP14+EGFR
ALT: 26 IU/L (ref 0–44)
AST: 20 IU/L (ref 0–40)
Albumin/Globulin Ratio: 1.4 (ref 1.2–2.2)
Albumin: 4.1 g/dL (ref 3.8–4.9)
Alkaline Phosphatase: 92 IU/L (ref 44–121)
BUN/Creatinine Ratio: 8 — ABNORMAL LOW (ref 9–20)
BUN: 8 mg/dL (ref 6–24)
Bilirubin Total: 0.3 mg/dL (ref 0.0–1.2)
CO2: 25 mmol/L (ref 20–29)
Calcium: 9.6 mg/dL (ref 8.7–10.2)
Chloride: 104 mmol/L (ref 96–106)
Creatinine, Ser: 1.04 mg/dL (ref 0.76–1.27)
GFR calc Af Amer: 92 mL/min/{1.73_m2} (ref >59)
GFR calc non Af Amer: 79 mL/min/{1.73_m2} (ref >59)
Globulin, Total: 2.9 g/dL (ref 1.5–4.5)
Glucose: 103 mg/dL — ABNORMAL HIGH (ref 65–99)
Potassium: 4.4 mmol/L (ref 3.5–5.2)
Sodium: 143 mmol/L (ref 134–144)
Total Protein: 7 g/dL (ref 6.0–8.5)

## 2020-09-16 LAB — CBC WITH DIFFERENTIAL/PLATELET
Basophils Absolute: 0.1 10*3/uL (ref 0.0–0.2)
Basos: 1 %
EOS (ABSOLUTE): 0.2 10*3/uL (ref 0.0–0.4)
Eos: 2 %
Hematocrit: 46.6 % (ref 37.5–51.0)
Hemoglobin: 14.6 g/dL (ref 13.0–17.7)
Immature Grans (Abs): 0 10*3/uL (ref 0.0–0.1)
Immature Granulocytes: 0 %
Lymphocytes Absolute: 2.1 10*3/uL (ref 0.7–3.1)
Lymphs: 21 %
MCH: 24.9 pg — ABNORMAL LOW (ref 26.6–33.0)
MCHC: 31.3 g/dL — ABNORMAL LOW (ref 31.5–35.7)
MCV: 80 fL (ref 79–97)
Monocytes Absolute: 0.4 10*3/uL (ref 0.1–0.9)
Monocytes: 4 %
Neutrophils Absolute: 7.3 10*3/uL — ABNORMAL HIGH (ref 1.4–7.0)
Neutrophils: 72 %
Platelets: 423 10*3/uL (ref 150–450)
RBC: 5.86 x10E6/uL — ABNORMAL HIGH (ref 4.14–5.80)
RDW: 14.7 % (ref 11.6–15.4)
WBC: 10.1 10*3/uL (ref 3.4–10.8)

## 2020-09-16 LAB — LIPID PANEL
Chol/HDL Ratio: 5.1 ratio — ABNORMAL HIGH (ref 0.0–5.0)
Cholesterol, Total: 159 mg/dL (ref 100–199)
HDL: 31 mg/dL — ABNORMAL LOW (ref >39)
LDL Chol Calc (NIH): 107 mg/dL — ABNORMAL HIGH (ref 0–99)
Triglycerides: 116 mg/dL (ref 0–149)
VLDL Cholesterol Cal: 21 mg/dL (ref 5–40)

## 2020-09-24 ENCOUNTER — Other Ambulatory Visit: Payer: Self-pay | Admitting: Family Medicine

## 2020-09-27 MED ORDER — DICLOFENAC SODIUM 50 MG PO TBEC: 50.0000 mg | DELAYED_RELEASE_TABLET | Freq: Two times a day (BID) | ORAL | 2 refills | Status: DC

## 2020-09-27 NOTE — Telephone Encounter (Signed)
E-prescribe down. resent 

## 2020-09-27 NOTE — Addendum Note (Signed)
Addended by: Kember Boch D on: 09/27/2020 10:24 AM   Modules accepted: Orders  

## 2020-10-11 ENCOUNTER — Other Ambulatory Visit: Payer: Self-pay | Admitting: Family Medicine

## 2020-10-11 DIAGNOSIS — E139 Other specified diabetes mellitus without complications: Secondary | ICD-10-CM

## 2020-10-25 DIAGNOSIS — Z6841 Body Mass Index (BMI) 40.0 and over, adult: Secondary | ICD-10-CM | POA: Diagnosis not present

## 2020-10-25 DIAGNOSIS — L989 Disorder of the skin and subcutaneous tissue, unspecified: Secondary | ICD-10-CM | POA: Diagnosis not present

## 2020-12-20 ENCOUNTER — Other Ambulatory Visit: Payer: Self-pay

## 2020-12-20 ENCOUNTER — Encounter: Payer: Self-pay | Admitting: Family Medicine

## 2020-12-20 ENCOUNTER — Ambulatory Visit (INDEPENDENT_AMBULATORY_CARE_PROVIDER_SITE_OTHER): Payer: BLUE CROSS/BLUE SHIELD | Admitting: Family Medicine

## 2020-12-20 NOTE — Progress Notes (Signed)
Patient had to leave before being seen, cancel the visit.

## 2020-12-30 ENCOUNTER — Other Ambulatory Visit: Payer: Self-pay | Admitting: Family Medicine

## 2020-12-30 DIAGNOSIS — E139 Other specified diabetes mellitus without complications: Secondary | ICD-10-CM

## 2020-12-30 DIAGNOSIS — I10 Essential (primary) hypertension: Secondary | ICD-10-CM

## 2020-12-31 ENCOUNTER — Other Ambulatory Visit: Payer: Self-pay | Admitting: Family Medicine

## 2021-01-02 ENCOUNTER — Other Ambulatory Visit: Payer: Self-pay | Admitting: Family Medicine

## 2021-01-02 DIAGNOSIS — E139 Other specified diabetes mellitus without complications: Secondary | ICD-10-CM

## 2021-01-03 ENCOUNTER — Encounter: Payer: Self-pay | Admitting: Family Medicine

## 2021-01-03 ENCOUNTER — Ambulatory Visit (INDEPENDENT_AMBULATORY_CARE_PROVIDER_SITE_OTHER): Payer: BLUE CROSS/BLUE SHIELD | Admitting: Family Medicine

## 2021-01-03 DIAGNOSIS — J329 Chronic sinusitis, unspecified: Secondary | ICD-10-CM | POA: Diagnosis not present

## 2021-01-03 DIAGNOSIS — N3000 Acute cystitis without hematuria: Secondary | ICD-10-CM

## 2021-01-03 DIAGNOSIS — N401 Enlarged prostate with lower urinary tract symptoms: Secondary | ICD-10-CM | POA: Diagnosis not present

## 2021-01-03 DIAGNOSIS — J40 Bronchitis, not specified as acute or chronic: Secondary | ICD-10-CM

## 2021-01-03 DIAGNOSIS — R351 Nocturia: Secondary | ICD-10-CM

## 2021-01-03 MED ORDER — LEVOFLOXACIN 500 MG PO TABS: 500.0000 mg | ORAL_TABLET | Freq: Every day | ORAL | 0 refills | Status: DC

## 2021-01-03 MED ORDER — PREDNISONE 10 MG PO TABS: ORAL_TABLET | ORAL | 0 refills | Status: DC

## 2021-01-03 NOTE — Progress Notes (Signed)
Subjective:    Patient ID: Jason Meyer, male    DOB: [birth year], 60 y.o.   MRN: [id number]   HPI: Jason Meyer is a 60 y.o. male presenting for urinary frequency for 2 days. Has hx frequent UTI. Pulse ox dropping to 84-86 with exertion. Has a little head cold with drainage and cough. Has Nasal and chest congestion.  Concerned that his UTI tends to go to his lungs. Denies fever. No loss of taste. Couldn't sleep last night due to cough and nocturia.    Depression screen [facility] 2/9 09/15/2020 06/09/2020 05/21/2019 03/11/2019 08/07/2018  Decreased Interest 0 0 0 0 0  Down, Depressed, Hopeless 0 0 0 0 0  PHQ - 2 Score 0 0 0 0 0     Relevant past medical, surgical, family and social history reviewed and updated as indicated.  Interim medical history since our last visit reviewed. Allergies and medications reviewed and updated.  ROS:  Review of Systems   Social History   Tobacco Use  Smoking Status Former Smoker  . Packs/day: 1.00  . Years: 33.00  . Pack years: 33.00  . Types: Cigarettes  . Quit date: 12/12/2007  . Years since quitting: 13.0  Smokeless Tobacco Never Used       Objective:     Wt Readings from Last 3 Encounters:  09/15/20 (!) 386 lb (175.1 kg)  06/09/20 (!) 400 lb (181.4 kg)  08/26/19 (!) 408 lb (185.1 kg)     Exam deferred. Pt. Harboring due to COVID 19. Phone visit performed.   Assessment & Plan:   1. Benign prostatic hyperplasia with nocturia   2. Acute cystitis without hematuria   3. Sinobronchitis     Meds ordered this encounter  Medications  . levofloxacin (LEVAQUIN) 500 MG tablet    Sig: Take 1 tablet (500 mg total) by mouth daily. For 10 days    Dispense:  10 tablet    Refill:  0  . predniSONE (DELTASONE) 10 MG tablet    Sig: Take 5 daily for 2 days followed by 4,3,2 and 1 for 2 days each.    Dispense:  30 tablet    Refill:  0    No orders of the defined types were placed in this encounter.     Diagnoses and all orders for  this visit:  Benign prostatic hyperplasia with nocturia  Acute cystitis without hematuria  Sinobronchitis  Other orders -     levofloxacin (LEVAQUIN) 500 MG tablet; Take 1 tablet (500 mg total) by mouth daily. For 10 days -     predniSONE (DELTASONE) 10 MG tablet; Take 5 daily for 2 days followed by 4,3,2 and 1 for 2 days each.    Virtual Visit via telephone Note  I discussed the limitations, risks, security and privacy concerns of performing an evaluation and management service by telephone and the availability of in person appointments. The patient was identified with two identifiers. Pt.expressed understanding and agreed to proceed. Pt. Is at home. Dr. Darlyn Read is in his office.  Follow Up Instructions:   I discussed the assessment and treatment plan with the patient. The patient was provided an opportunity to ask questions and all were answered. The patient agreed with the plan and demonstrated an understanding of the instructions.   The patient was advised to call back or seek an in-person evaluation if the symptoms worsen or if the condition fails to improve as anticipated.   Total minutes including chart review and  phone contact time: 12    Follow up plan: Return if symptoms worsen or fail to improve.  Mechele Claude, MD [facility]

## 2021-01-11 ENCOUNTER — Other Ambulatory Visit: Payer: Self-pay | Admitting: Family Medicine

## 2021-01-11 DIAGNOSIS — E1169 Type 2 diabetes mellitus with other specified complication: Secondary | ICD-10-CM

## 2021-01-11 DIAGNOSIS — E785 Hyperlipidemia, unspecified: Secondary | ICD-10-CM

## 2021-01-16 ENCOUNTER — Other Ambulatory Visit: Payer: Self-pay | Admitting: Family Medicine

## 2021-01-16 DIAGNOSIS — I10 Essential (primary) hypertension: Secondary | ICD-10-CM

## 2021-01-18 ENCOUNTER — Other Ambulatory Visit: Payer: Self-pay | Admitting: Family Medicine

## 2021-01-18 DIAGNOSIS — E1159 Type 2 diabetes mellitus with other circulatory complications: Secondary | ICD-10-CM

## 2021-01-18 DIAGNOSIS — I152 Hypertension secondary to endocrine disorders: Secondary | ICD-10-CM

## 2021-01-19 ENCOUNTER — Ambulatory Visit: Payer: BLUE CROSS/BLUE SHIELD | Admitting: Family Medicine

## 2021-01-19 ENCOUNTER — Other Ambulatory Visit: Payer: Self-pay

## 2021-01-19 ENCOUNTER — Encounter: Payer: Self-pay | Admitting: Family Medicine

## 2021-01-19 VITALS — BP 127/79 | HR 71 | Ht 71.0 in | Wt 388.0 lb

## 2021-01-19 DIAGNOSIS — E785 Hyperlipidemia, unspecified: Secondary | ICD-10-CM

## 2021-01-19 DIAGNOSIS — I10 Essential (primary) hypertension: Secondary | ICD-10-CM | POA: Diagnosis not present

## 2021-01-19 DIAGNOSIS — N390 Urinary tract infection, site not specified: Secondary | ICD-10-CM

## 2021-01-19 DIAGNOSIS — E139 Other specified diabetes mellitus without complications: Secondary | ICD-10-CM | POA: Diagnosis not present

## 2021-01-19 DIAGNOSIS — E1159 Type 2 diabetes mellitus with other circulatory complications: Secondary | ICD-10-CM | POA: Diagnosis not present

## 2021-01-19 DIAGNOSIS — N401 Enlarged prostate with lower urinary tract symptoms: Secondary | ICD-10-CM

## 2021-01-19 DIAGNOSIS — E1169 Type 2 diabetes mellitus with other specified complication: Secondary | ICD-10-CM

## 2021-01-19 DIAGNOSIS — I152 Hypertension secondary to endocrine disorders: Secondary | ICD-10-CM | POA: Diagnosis not present

## 2021-01-19 DIAGNOSIS — R350 Frequency of micturition: Secondary | ICD-10-CM

## 2021-01-19 LAB — BAYER DCA HB A1C WAIVED: HB A1C (BAYER DCA - WAIVED): 6.5 % (ref <7.0)

## 2021-01-19 MED ORDER — METFORMIN HCL 1000 MG PO TABS: 1000.0000 mg | ORAL_TABLET | Freq: Two times a day (BID) | ORAL | 3 refills | Status: DC

## 2021-01-19 MED ORDER — ALBUTEROL SULFATE (2.5 MG/3ML) 0.083% IN NEBU: 2.5000 mg | INHALATION_SOLUTION | RESPIRATORY_TRACT | 3 refills | Status: DC | PRN

## 2021-01-19 MED ORDER — METOPROLOL TARTRATE 100 MG PO TABS: 100.0000 mg | ORAL_TABLET | Freq: Two times a day (BID) | ORAL | 3 refills | Status: DC

## 2021-01-19 MED ORDER — ATORVASTATIN CALCIUM 80 MG PO TABS: 80.0000 mg | ORAL_TABLET | Freq: Every day | ORAL | 3 refills | Status: DC

## 2021-01-19 MED ORDER — TRULICITY 1.5 MG/0.5ML ~~LOC~~ SOAJ
1.5000 mg | SUBCUTANEOUS | 3 refills | Status: DC
Start: 2021-01-19 — End: 2022-02-21

## 2021-01-19 MED ORDER — EMPAGLIFLOZIN 10 MG PO TABS: 10.0000 mg | ORAL_TABLET | Freq: Every day | ORAL | 3 refills | Status: DC

## 2021-01-19 MED ORDER — AMLODIPINE BESYLATE 10 MG PO TABS: 10.0000 mg | ORAL_TABLET | Freq: Every day | ORAL | 3 refills | Status: DC

## 2021-01-19 MED ORDER — DICLOFENAC SODIUM 50 MG PO TBEC: 50.0000 mg | DELAYED_RELEASE_TABLET | Freq: Two times a day (BID) | ORAL | 3 refills | Status: DC

## 2021-01-19 MED ORDER — LISINOPRIL 40 MG PO TABS: 40.0000 mg | ORAL_TABLET | Freq: Every day | ORAL | 3 refills | Status: DC

## 2021-01-19 NOTE — Progress Notes (Signed)
BP 127/79   Pulse 71   Ht 5' 11"  (1.803 m)   Wt (!) 388 lb (176 kg)   SpO2 96%   BMI 54.12 kg/m    Subjective:   Patient ID: Jason Meyer, male    DOB: [birth year], 60 y.o.   MRN: [id number]  HPI: Jason Meyer Dement is a 60 y.o. male presenting on 01/19/2021 for Medical Management of Chronic Issues, Diabetes, Hyperlipidemia, and Hypertension   HPI Type 2 diabetes mellitus Patient comes in today for recheck of his diabetes. Patient has been currently taking Trulicity and Jardiance and Metformin. Patient is currently on an ACE inhibitor/ARB. Patient has not seen an ophthalmologist this year. Patient denies any issues with their feet except still having some issues with swelling, recommended compression stockings. The symptom started onset as an adult hyperlipidemia hypertension ARE RELATED TO DM   Hyperlipidemia Patient is coming in for recheck of his hyperlipidemia. The patient is currently taking atorvastatin. They deny any issues with myalgias or history of liver damage from it. They deny any focal numbness or weakness or chest pain.   Hypertension Patient is currently on amlodipine and lisinopril and metoprolol, and their blood pressure today is 127/79. Patient denies any lightheadedness or dizziness. Patient denies headaches, blurred vision, chest pains, shortness of breath, or weakness. Denies any side effects from medication and is content with current medication.   Patient has had some BPH and recurrent UTIs, sees urology for this.  Relevant past medical, surgical, family and social history reviewed and updated as indicated. Interim medical history since our last visit reviewed. Allergies and medications reviewed and updated.  Review of Systems  Constitutional: Negative for chills and fever.  Eyes: Negative for visual disturbance.  Respiratory: Negative for shortness of breath and wheezing.   Cardiovascular: Positive for leg swelling. Negative for chest pain and  palpitations.  Musculoskeletal: Negative for back pain and gait problem.  Skin: Negative for rash.  Neurological: Negative for dizziness, weakness, numbness and headaches.  All other systems reviewed and are negative.   Per HPI unless specifically indicated above   Allergies as of 01/19/2021      Reactions   Penicillins       Medication List       Accurate as of January 19, 2021 10:25 AM. If you have any questions, ask your nurse or doctor.        STOP taking these medications   levofloxacin 500 MG tablet Commonly known as: LEVAQUIN Stopped by: Fransisca Kaufmann Cheron Coryell, MD   predniSONE 10 MG tablet Commonly known as: DELTASONE Stopped by: Fransisca Kaufmann Kjersti Dittmer, MD   triamcinolone ointment 0.5 % Commonly known as: KENALOG Stopped by: Worthy Rancher, MD     TAKE these medications   Accu-Chek Softclix Lancets lancets Test BID Dx E13.9   albuterol (2.5 MG/3ML) 0.083% nebulizer solution Commonly known as: PROVENTIL Take 3 mLs by nebulization every 4 hours as needed for wheezing or shortness of breath.   amLODipine 10 MG tablet Commonly known as: NORVASC TAKE 1 TABLET BY MOUTH EVERY DAY   atorvastatin 80 MG tablet Commonly known as: LIPITOR TAKE 1 TABLET BY MOUTH EVERY DAY   blood glucose meter kit and supplies Kit Test BID Dx E13.9   diclofenac 50 MG EC tablet Commonly known as: VOLTAREN TAKE 1 TABLET BY MOUTH TWICE A DAY   fluticasone 50 MCG/ACT nasal spray Commonly known as: FLONASE Place 2 sprays into both nostrils daily.   furosemide 20 MG tablet  Commonly known as: LASIX Take 1 tablet (20 mg total) by mouth daily as needed for fluid or edema.   glucose blood test strip Commonly known as: Accu-Chek Aviva Plus Test BID Dx E13.9   Jardiance 10 MG Tabs tablet Generic drug: empagliflozin TAKE 1 TABLET BY MOUTH EVERY DAY   lisinopril 40 MG tablet Commonly known as: ZESTRIL TAKE 1 TABLET BY MOUTH EVERY DAY   metFORMIN 1000 MG tablet Commonly known as:  GLUCOPHAGE TAKE 1 TABLET (1,000 MG TOTAL) BY MOUTH 2 (TWO) TIMES DAILY WITH A MEAL.   metoprolol tartrate 100 MG tablet Commonly known as: LOPRESSOR TAKE 1 TABLET BY MOUTH TWICE A DAY   potassium chloride 10 MEQ CR capsule Commonly known as: MICRO-K Take 1 capsule (10 mEq total) by mouth daily as needed.   sildenafil 20 MG tablet Commonly known as: Revatio 2-5 pills once daily as needed for erectile dysfunction   Trulicity 1.5 TD/4.2AJ Sopn Generic drug: Dulaglutide INJECT 1.5 MG INTO THE SKIN EVERY 7 (SEVEN) DAYS.        Objective:   BP 127/79   Pulse 71   Ht 5' 11"  (1.803 m)   Wt (!) 388 lb (176 kg)   SpO2 96%   BMI 54.12 kg/m   Wt Readings from Last 3 Encounters:  01/19/21 (!) 388 lb (176 kg)  09/15/20 (!) 386 lb (175.1 kg)  06/09/20 (!) 400 lb (181.4 kg)    Physical Exam Vitals and nursing note reviewed.  Constitutional:      General: He is not in acute distress.    Appearance: He is well-developed and well-nourished. He is not diaphoretic.  Eyes:     General: No scleral icterus.    Extraocular Movements: EOM normal.     Conjunctiva/sclera: Conjunctivae normal.  Neck:     Thyroid: No thyromegaly.  Cardiovascular:     Rate and Rhythm: Normal rate and regular rhythm.     Pulses: Intact distal pulses.     Heart sounds: Normal heart sounds. No murmur heard.   Pulmonary:     Effort: Pulmonary effort is normal. No respiratory distress.     Breath sounds: Normal breath sounds. No wheezing.  Musculoskeletal:        General: Swelling (1+ edema bilaterally) present. No edema. Normal range of motion.     Cervical back: Neck supple.  Lymphadenopathy:     Cervical: No cervical adenopathy.  Skin:    General: Skin is warm and dry.     Findings: No rash.  Neurological:     Mental Status: He is alert and oriented to person, place, and time.     Coordination: Coordination normal.  Psychiatric:        Mood and Affect: Mood and affect normal.        Behavior:  Behavior normal.       Assessment & Plan:   Problem List Items Addressed This Visit      Cardiovascular and Mediastinum   Hypertension associated with diabetes ([place]) - Primary   Relevant Medications   amLODipine (NORVASC) 10 MG tablet   atorvastatin (LIPITOR) 80 MG tablet   empagliflozin (JARDIANCE) 10 MG TABS tablet   metFORMIN (GLUCOPHAGE) 1000 MG tablet   lisinopril (ZESTRIL) 40 MG tablet   metoprolol tartrate (LOPRESSOR) 100 MG tablet   Dulaglutide (TRULICITY) 1.5 GO/1.1XB SOPN   Other Relevant Orders   CBC with Differential/Platelet   CMP14+EGFR   Lipid panel   Bayer DCA Hb A1c Waived  Endocrine   Type 2 diabetes mellitus with other specified complication ([facility])   Relevant Medications   atorvastatin (LIPITOR) 80 MG tablet   empagliflozin (JARDIANCE) 10 MG TABS tablet   metFORMIN (GLUCOPHAGE) 1000 MG tablet   lisinopril (ZESTRIL) 40 MG tablet   Dulaglutide (TRULICITY) 1.5 AY/8.4FU SOPN   Hyperlipidemia associated with type 2 diabetes mellitus (H[facility])   Relevant Medications   atorvastatin (LIPITOR) 80 MG tablet   empagliflozin (JARDIANCE) 10 MG TABS tablet   metFORMIN (GLUCOPHAGE) 1000 MG tablet   lisinopril (ZESTRIL) 40 MG tablet   Dulaglutide (TRULICITY) 1.5 WT/2.1CC SOPN   Other Relevant Orders   CBC with Differential/Platelet   CMP14+EGFR   Lipid panel   Bayer DCA Hb A1c Waived    Other Visit Diagnoses    Essential hypertension       Relevant Medications   amLODipine (NORVASC) 10 MG tablet   atorvastatin (LIPITOR) 80 MG tablet   lisinopril (ZESTRIL) 40 MG tablet   metoprolol tartrate (LOPRESSOR) 100 MG tablet   Benign prostatic hyperplasia with urinary frequency       Recurrent UTI          Patient is currently on Jardiance and has had some urinary tract infections in the past year, he is also been diagnosed with retention from [place] and is seeing urology for this and has started Flomax but not consistently, recommended he try that more consistently and  then if he still has a lot of UTIs in the future we may have to consider reevaluating the Jardiance. Follow up plan: Return in about 3 months (around 04/18/2021), or if symptoms worsen or fail to improve, for Diabetes hypertension plan.  Counseling provided for all of the vaccine components Orders Placed This Encounter  Procedures  . CBC with Differential/Platelet  . CMP14+EGFR  . Lipid panel  . Bayer [facility] Hb A1c Waived    Caryl Pina, MD [facility] 01/19/2021, 10:25 AM

## 2021-01-20 LAB — CBC WITH DIFFERENTIAL/PLATELET
Basophils Absolute: 0.1 10*3/uL (ref 0.0–0.2)
Basos: 0 %
EOS (ABSOLUTE): 0.3 10*3/uL (ref 0.0–0.4)
Eos: 2 %
Hematocrit: 48.1 % (ref 37.5–51.0)
Hemoglobin: 15.6 g/dL (ref 13.0–17.7)
Immature Grans (Abs): 0.1 10*3/uL (ref 0.0–0.1)
Immature Granulocytes: 1 %
Lymphocytes Absolute: 2.3 10*3/uL (ref 0.7–3.1)
Lymphs: 17 %
MCH: 25.7 pg — ABNORMAL LOW (ref 26.6–33.0)
MCHC: 32.4 g/dL (ref 31.5–35.7)
MCV: 79 fL (ref 79–97)
Monocytes Absolute: 0.7 10*3/uL (ref 0.1–0.9)
Monocytes: 5 %
Neutrophils Absolute: 9.8 10*3/uL — ABNORMAL HIGH (ref 1.4–7.0)
Neutrophils: 75 %
Platelets: 297 10*3/uL (ref 150–450)
RBC: 6.07 x10E6/uL — ABNORMAL HIGH (ref 4.14–5.80)
RDW: 17.3 % — ABNORMAL HIGH (ref 11.6–15.4)
WBC: 13.2 10*3/uL — ABNORMAL HIGH (ref 3.4–10.8)

## 2021-01-20 LAB — CMP14+EGFR
ALT: 18 IU/L (ref 0–44)
AST: 18 IU/L (ref 0–40)
Albumin/Globulin Ratio: 1.6 (ref 1.2–2.2)
Albumin: 4.4 g/dL (ref 3.8–4.9)
Alkaline Phosphatase: 128 IU/L — ABNORMAL HIGH (ref 44–121)
BUN/Creatinine Ratio: 12 (ref 9–20)
BUN: 9 mg/dL (ref 6–24)
Bilirubin Total: <0.2 mg/dL (ref 0.0–1.2)
CO2: 20 mmol/L (ref 20–29)
Calcium: 9.6 mg/dL (ref 8.7–10.2)
Chloride: 100 mmol/L (ref 96–106)
Creatinine, Ser: 0.74 mg/dL — ABNORMAL LOW (ref 0.76–1.27)
GFR calc Af Amer: 118 mL/min/{1.73_m2} (ref >59)
GFR calc non Af Amer: 102 mL/min/{1.73_m2} (ref >59)
Globulin, Total: 2.8 g/dL (ref 1.5–4.5)
Glucose: 153 mg/dL — ABNORMAL HIGH (ref 65–99)
Potassium: 4.5 mmol/L (ref 3.5–5.2)
Sodium: 139 mmol/L (ref 134–144)
Total Protein: 7.2 g/dL (ref 6.0–8.5)

## 2021-01-20 LAB — LIPID PANEL
Chol/HDL Ratio: 3.5 ratio (ref 0.0–5.0)
Cholesterol, Total: 151 mg/dL (ref 100–199)
HDL: 43 mg/dL (ref >39)
LDL Chol Calc (NIH): 84 mg/dL (ref 0–99)
Triglycerides: 137 mg/dL (ref 0–149)
VLDL Cholesterol Cal: 24 mg/dL (ref 5–40)

## 2021-03-17 DIAGNOSIS — E1142 Type 2 diabetes mellitus with diabetic polyneuropathy: Secondary | ICD-10-CM | POA: Diagnosis not present

## 2021-03-17 DIAGNOSIS — B351 Tinea unguium: Secondary | ICD-10-CM | POA: Diagnosis not present

## 2021-04-15 ENCOUNTER — Ambulatory Visit: Payer: BLUE CROSS/BLUE SHIELD | Admitting: Family Medicine

## 2021-04-26 ENCOUNTER — Other Ambulatory Visit: Payer: Self-pay | Admitting: Family Medicine

## 2021-04-26 DIAGNOSIS — I10 Essential (primary) hypertension: Secondary | ICD-10-CM

## 2021-04-26 DIAGNOSIS — E785 Hyperlipidemia, unspecified: Secondary | ICD-10-CM

## 2021-04-26 DIAGNOSIS — E1169 Type 2 diabetes mellitus with other specified complication: Secondary | ICD-10-CM

## 2021-05-03 ENCOUNTER — Other Ambulatory Visit: Payer: Self-pay

## 2021-05-03 ENCOUNTER — Encounter: Payer: Self-pay | Admitting: Nurse Practitioner

## 2021-05-03 ENCOUNTER — Ambulatory Visit: Payer: BLUE CROSS/BLUE SHIELD | Admitting: Nurse Practitioner

## 2021-05-03 VITALS — BP 173/93 | HR 79 | Temp 97.6°F | Ht 71.0 in | Wt 382.0 lb

## 2021-05-03 DIAGNOSIS — R350 Frequency of micturition: Secondary | ICD-10-CM | POA: Insufficient documentation

## 2021-05-03 LAB — URINALYSIS, ROUTINE W REFLEX MICROSCOPIC
Bilirubin, UA: NEGATIVE
Ketones, UA: NEGATIVE
Nitrite, UA: POSITIVE — AB
Specific Gravity, UA: 1.015 (ref 1.005–1.030)
Urobilinogen, Ur: 0.2 mg/dL (ref 0.2–1.0)
pH, UA: 7.5 (ref 5.0–7.5)

## 2021-05-03 MED ORDER — CIPROFLOXACIN HCL 500 MG PO TABS: 500.0000 mg | ORAL_TABLET | Freq: Two times a day (BID) | ORAL | 0 refills | Status: AC

## 2021-05-03 NOTE — Progress Notes (Signed)
Acute Office Visit  Subjective:    Patient ID: Jason Meyer, male    DOB: [birth year], 60 y.o.   MRN: [id number]  Chief Complaint  Patient presents with  . Urinary Frequency    Urinary Frequency  This is a recurrent problem. The current episode started in the past 7 days. The problem occurs every urination. The problem has been gradually worsening. There has been no fever. Associated symptoms include frequency and urgency. Pertinent negatives include no chills, discharge, flank pain, nausea or vomiting. He has tried nothing for the symptoms. His past medical history is significant for recurrent UTIs.     Past Medical History:  Diagnosis Date  . Allergy   . Diabetes mellitus without complication ([place])   . Hyperlipidemia   . Hypertension     History reviewed. No pertinent surgical history.  Family History  Problem Relation Age of Onset  . Heart attack Brother   . Alzheimer's disease Mother   . Heart attack Father   . Stroke Father     Social History   Socioeconomic History  . Marital status: Married    Spouse name: Not on file  . Number of children: Not on file  . Years of education: Not on file  . Highest education level: Not on file  Occupational History  . Not on file  Tobacco Use  . Smoking status: Former Smoker    Packs/day: 1.00    Years: 33.00    Pack years: 33.00    Types: Cigarettes    Quit date: 12/12/2007    Years since quitting: 13.4  . Smokeless tobacco: Never Used  Vaping Use  . Vaping Use: Never used  Substance and Sexual Activity  . Alcohol use: No  . Drug use: No  . Sexual activity: Not on file  Other Topics Concern  . Not on file  Social History Narrative  . Not on file   Social Determinants of Health   Financial Resource Strain: Not on file  Food Insecurity: Not on file  Transportation Needs: Not on file  Physical Activity: Not on file  Stress: Not on file  Social Connections: Not on file  Intimate Partner Violence: Not on  file    Outpatient Medications Prior to Visit  Medication Sig Dispense Refill  . Accu-Chek Softclix Lancets lancets Test BID Dx E13.9 200 each 3  . albuterol (PROVENTIL) (2.5 MG/3ML) 0.083% nebulizer solution Take 3 mLs (2.5 mg total) by nebulization every 4 (four) hours as needed for wheezing or shortness of breath. 90 mL 3  . amLODipine (NORVASC) 10 MG tablet Take 1 tablet (10 mg total) by mouth daily. 90 tablet 3  . atorvastatin (LIPITOR) 80 MG tablet TAKE 1 TABLET BY MOUTH EVERY DAY 90 tablet 0  . blood glucose meter kit and supplies KIT Test BID Dx E13.9 1 each 0  . diclofenac (VOLTAREN) 50 MG EC tablet Take 1 tablet (50 mg total) by mouth 2 (two) times daily. 60 tablet 3  . Dulaglutide (TRULICITY) 1.5 EB/5.8XE SOPN Inject 1.5 mg into the skin once a week. 6 mL 3  . empagliflozin (JARDIANCE) 10 MG TABS tablet Take 1 tablet (10 mg total) by mouth daily. 90 tablet 3  . fluticasone (FLONASE) 50 MCG/ACT nasal spray Place 2 sprays into both nostrils daily. 16 g 6  . furosemide (LASIX) 20 MG tablet Take 1 tablet (20 mg total) by mouth daily as needed for fluid or edema. 6 tablet 3  . glucose blood (ACCU-CHEK  AVIVA PLUS) test strip Test BID Dx E13.9 200 each 3  . lisinopril (ZESTRIL) 40 MG tablet Take 1 tablet (40 mg total) by mouth daily. 90 tablet 3  . metFORMIN (GLUCOPHAGE) 1000 MG tablet Take 1 tablet (1,000 mg total) by mouth 2 (two) times daily with a meal. 180 tablet 3  . metoprolol tartrate (LOPRESSOR) 100 MG tablet TAKE 1 TABLET BY MOUTH TWICE A DAY 180 tablet 0  . potassium chloride (MICRO-K) 10 MEQ CR capsule Take 1 capsule (10 mEq total) by mouth daily as needed. 7 capsule 3  . sildenafil (REVATIO) 20 MG tablet 2-5 pills once daily as needed for erectile dysfunction 50 tablet 3   No facility-administered medications prior to visit.    Allergies  Allergen Reactions  . Penicillins     Review of Systems  Constitutional: Negative for chills.  HENT: Negative.   Respiratory:  Negative.   Gastrointestinal: Negative.  Negative for nausea and vomiting.  Genitourinary: Positive for frequency and urgency. Negative for dysuria and flank pain.  All other systems reviewed and are negative.      Objective:    Physical Exam Vitals and nursing note reviewed.  Constitutional:      Appearance: Normal appearance.  HENT:     Head: Normocephalic.     Nose: Nose normal.  Eyes:     Conjunctiva/sclera: Conjunctivae normal.  Cardiovascular:     Rate and Rhythm: Normal rate and regular rhythm.     Pulses: Normal pulses.     Heart sounds: Normal heart sounds.  Pulmonary:     Effort: Pulmonary effort is normal.     Breath sounds: Normal breath sounds.  Abdominal:     General: Bowel sounds are normal.     Tenderness: There is no right CVA tenderness or left CVA tenderness.  Skin:    Findings: No rash.  Neurological:     Mental Status: He is alert and oriented to person, place, and time.  Psychiatric:        Behavior: Behavior normal.     BP (!) 173/93   Pulse 79   Temp 97.6 F (36.4 C) (Temporal)   Ht 5' 11" (1.803 m)   Wt (!) 382 lb (173.3 kg)   SpO2 93%   BMI 53.28 kg/m  Wt Readings from Last 3 Encounters:  05/03/21 (!) 382 lb (173.3 kg)  01/19/21 (!) 388 lb (176 kg)  09/15/20 (!) 386 lb (175.1 kg)    Health Maintenance Due  Topic Date Due  . COVID-19 Vaccine (3 - Booster for Moderna series) 09/15/2020    There are no preventive care reminders to display for this patient.   Lab Results  Component Value Date   TSH 1.750 07/06/2014   Lab Results  Component Value Date   WBC 13.2 (H) 01/19/2021   HGB 15.6 01/19/2021   HCT 48.1 01/19/2021   MCV 79 01/19/2021   PLT 297 01/19/2021   Lab Results  Component Value Date   NA 139 01/19/2021   K 4.5 01/19/2021   CO2 20 01/19/2021   GLUCOSE 153 (H) 01/19/2021   BUN 9 01/19/2021   CREATININE 0.74 (L) 01/19/2021   BILITOT <0.2 01/19/2021   ALKPHOS 128 (H) 01/19/2021   AST 18 01/19/2021   ALT  18 01/19/2021   PROT 7.2 01/19/2021   ALBUMIN 4.4 01/19/2021   CALCIUM 9.6 01/19/2021   ANIONGAP 13 12/07/2017   Lab Results  Component Value Date   CHOL 151 01/19/2021   Lab  Results  Component Value Date   HDL 43 01/19/2021   Lab Results  Component Value Date   LDLCALC 84 01/19/2021   Lab Results  Component Value Date   TRIG 137 01/19/2021   Lab Results  Component Value Date   CHOLHDL 3.5 01/19/2021   Lab Results  Component Value Date   HGBA1C 6.5 01/19/2021       Assessment & Plan:   Problem List Items Addressed This Visit      Other   Urinary frequency - Primary    Urinary frequency not well controlled.  Completed urinalysis-positive for leukocytes and nitrites.  Started patient on Cipro 500 mg twice daily.  Urinary cultures completed results pending.  Education provided to patient with printed handouts given.   Rx sent to pharmacy.  Follow-up with worsening unresolved symptoms.      Relevant Medications   ciprofloxacin (CIPRO) 500 MG tablet   Other Relevant Orders   Urinalysis, Routine w reflex microscopic (Completed)   Urine Culture       Meds ordered this encounter  Medications  . ciprofloxacin (CIPRO) 500 MG tablet    Sig: Take 1 tablet (500 mg total) by mouth 2 (two) times daily for 3 days.    Dispense:  20 tablet    Refill:  0    Order Specific Question:   Supervising Provider    Answer:   Janora Norlander [[id number]]     Ivy Lynn, NP

## 2021-05-03 NOTE — Assessment & Plan Note (Signed)
Urinary frequency not well controlled.  Completed urinalysis-positive for leukocytes and nitrites.  Started patient on Cipro 500 mg twice daily.  Urinary cultures completed results pending.  Education provided to patient with printed handouts given.   Rx sent to pharmacy.  Follow-up with worsening unresolved symptoms.

## 2021-05-03 NOTE — Patient Instructions (Signed)
Urinary Tract Infection, Adult A urinary tract infection (UTI) is an infection of any part of the urinary tract. The urinary tract includes:  The kidneys.  The ureters.  The bladder.  The urethra. These organs make, store, and get rid of pee (urine) in the body. What are the causes? This infection is caused by germs (bacteria) in your genital area. These germs grow and cause swelling (inflammation) of your urinary tract. What increases the risk? The following factors may make you more likely to develop this condition:  Using a small, thin tube (catheter) to drain pee.  Not being able to control when you pee or poop (incontinence).  Being male. If you are male, these things can increase the risk: ? Using these methods to prevent pregnancy:  A medicine that kills sperm (spermicide).  A device that blocks sperm (diaphragm). ? Having low levels of a male hormone (estrogen). ? Being pregnant. You are more likely to develop this condition if:  You have genes that add to your risk.  You are sexually active.  You take antibiotic medicines.  You have trouble peeing because of: ? A prostate that is bigger than normal, if you are male. ? A blockage in the part of your body that drains pee from the bladder. ? A kidney stone. ? A nerve condition that affects your bladder. ? Not getting enough to drink. ? Not peeing often enough.  You have other conditions, such as: ? Diabetes. ? A weak disease-fighting system (immune system). ? Sickle cell disease. ? Gout. ? Injury of the spine. What are the signs or symptoms? Symptoms of this condition include:  Needing to pee right away.  Peeing small amounts often.  Pain or burning when peeing.  Blood in the pee.  Pee that smells bad or not like normal.  Trouble peeing.  Pee that is cloudy.  Fluid coming from the vagina, if you are male.  Pain in the belly or lower back. Other symptoms include:  Vomiting.  Not  feeling hungry.  Feeling mixed up (confused). This may be the first symptom in older adults.  Being tired and grouchy (irritable).  A fever.  Watery poop (diarrhea). How is this treated?  Taking antibiotic medicine.  Taking other medicines.  Drinking enough water. In some cases, you may need to see a specialist. Follow these instructions at home: Medicines  Take over-the-counter and prescription medicines only as told by your doctor.  If you were prescribed an antibiotic medicine, take it as told by your doctor. Do not stop taking it even if you start to feel better. General instructions  Make sure you: ? Pee until your bladder is empty. ? Do not hold pee for a long time. ? Empty your bladder after sex. ? Wipe from front to back after peeing or pooping if you are a male. Use each tissue one time when you wipe.  Drink enough fluid to keep your pee pale yellow.  Keep all follow-up visits.   Contact a doctor if:  You do not get better after 1-2 days.  Your symptoms go away and then come back. Get help right away if:  You have very bad back pain.  You have very bad pain in your lower belly.  You have a fever.  You have chills.  You feeling like you will vomit or you vomit. Summary  A urinary tract infection (UTI) is an infection of any part of the urinary tract.  This condition is caused by   germs in your genital area.  There are many risk factors for a UTI.  Treatment includes antibiotic medicines.  Drink enough fluid to keep your pee pale yellow. This information is not intended to replace advice given to you by your health care provider. Make sure you discuss any questions you have with your health care provider. Document Revised: 07/09/2020 Document Reviewed: 07/09/2020 Elsevier Patient Education  2021 Elsevier Inc.  

## 2021-05-09 ENCOUNTER — Other Ambulatory Visit: Payer: Self-pay | Admitting: Nurse Practitioner

## 2021-05-09 DIAGNOSIS — N390 Urinary tract infection, site not specified: Secondary | ICD-10-CM

## 2021-05-09 LAB — URINE CULTURE

## 2021-05-09 MED ORDER — CIPROFLOXACIN HCL 500 MG PO TABS: 500.0000 mg | ORAL_TABLET | Freq: Two times a day (BID) | ORAL | 0 refills | Status: AC

## 2021-05-11 ENCOUNTER — Other Ambulatory Visit: Payer: Self-pay | Admitting: Family Medicine

## 2021-05-26 ENCOUNTER — Other Ambulatory Visit: Payer: Self-pay

## 2021-05-26 ENCOUNTER — Ambulatory Visit: Payer: BLUE CROSS/BLUE SHIELD | Admitting: Family Medicine

## 2021-05-26 ENCOUNTER — Encounter: Payer: Self-pay | Admitting: Family Medicine

## 2021-05-26 VITALS — BP 161/79 | HR 70 | Ht 71.0 in | Wt 384.0 lb

## 2021-05-26 DIAGNOSIS — I152 Hypertension secondary to endocrine disorders: Secondary | ICD-10-CM | POA: Diagnosis not present

## 2021-05-26 DIAGNOSIS — E1159 Type 2 diabetes mellitus with other circulatory complications: Secondary | ICD-10-CM | POA: Diagnosis not present

## 2021-05-26 DIAGNOSIS — E785 Hyperlipidemia, unspecified: Secondary | ICD-10-CM

## 2021-05-26 DIAGNOSIS — E1169 Type 2 diabetes mellitus with other specified complication: Secondary | ICD-10-CM | POA: Diagnosis not present

## 2021-05-26 DIAGNOSIS — D72829 Elevated white blood cell count, unspecified: Secondary | ICD-10-CM

## 2021-05-26 LAB — BAYER DCA HB A1C WAIVED: HB A1C (BAYER DCA - WAIVED): 6.2 % (ref <7.0)

## 2021-05-26 NOTE — Progress Notes (Signed)
BP (!) 161/79   Pulse 70   Ht 5' 11"  (1.803 m)   Wt (!) 384 lb (174.2 kg)   SpO2 95%   BMI 53.56 kg/m    Subjective:   Patient ID: Jason Meyer, male    DOB: [birth year], 60 y.o.   MRN: [id number]  HPI: Jason Meyer is a 60 y.o. male presenting on 05/26/2021 for Medical Management of Chronic Issues and Diabetes   HPI Hypertension Patient is currently on amlodipine and furosemide and lisinopril and metoprolol, and their blood pressure today is 161/79 although he admits he has not taken his medicine yet today because he had not eaten until recently.. Patient denies any lightheadedness or dizziness. Patient denies headaches, blurred vision, chest pains, shortness of breath, or weakness. Denies any side effects from medication and is content with current medication.   Type 2 diabetes mellitus Patient comes in today for recheck of his diabetes. Patient has been currently taking Jardiance and Trulicity and metformin, A1c is 6.2 today.. Patient is currently on an ACE inhibitor/ARB. Patient has not seen an ophthalmologist this year. Patient denies any issues with their feet. The symptom started onset as an adult hypertension and hyperlipidemia ARE RELATED TO DM   Hyperlipidemia Patient is coming in for recheck of his hyperlipidemia. The patient is currently taking atorvastatin. They deny any issues with myalgias or history of liver damage from it. They deny any focal numbness or weakness or chest pain.   Patient is coming in with some left shoulder pain.  He says been hurting for little more than a week and he was lifting up a bunch heavy dog food boxes that were 50 pounds and that is when he started feeling that the next day after.  He said it hurts with range of motion and pain to palpation and denies any shortness of breath or tightness in his chest.  Relevant past medical, surgical, family and social history reviewed and updated as indicated. Interim medical history since our last  visit reviewed. Allergies and medications reviewed and updated.  Review of Systems  Constitutional:  Negative for chills and fever.  Respiratory:  Negative for shortness of breath and wheezing.   Cardiovascular:  Negative for chest pain and leg swelling.  Musculoskeletal:  Negative for back pain and gait problem.  Skin:  Negative for rash.  Neurological:  Negative for dizziness, weakness and light-headedness.  All other systems reviewed and are negative.  Per HPI unless specifically indicated above   Allergies as of 05/26/2021       Reactions   Penicillins         Medication List        Accurate as of May 26, 2021  2:41 PM. If you have any questions, ask your nurse or doctor.          Accu-Chek Softclix Lancets lancets Test BID Dx E13.9   albuterol (2.5 MG/3ML) 0.083% nebulizer solution Commonly known as: PROVENTIL Take 3 mLs by nebulization every 4 hours as needed for wheezing or shortness of breath.   amLODipine 10 MG tablet Commonly known as: NORVASC Take 1 tablet (10 mg total) by mouth daily.   atorvastatin 80 MG tablet Commonly known as: LIPITOR TAKE 1 TABLET BY MOUTH EVERY DAY   blood glucose meter kit and supplies Kit Test BID Dx E13.9   diclofenac 50 MG EC tablet Commonly known as: VOLTAREN Take 1 tablet (50 mg total) by mouth 2 (two) times daily.   empagliflozin 10  MG Tabs tablet Commonly known as: Jardiance Take 1 tablet (10 mg total) by mouth daily.   fluticasone 50 MCG/ACT nasal spray Commonly known as: FLONASE Place 2 sprays into both nostrils daily.   furosemide 20 MG tablet Commonly known as: LASIX Take 1 tablet (20 mg total) by mouth daily as needed for fluid or edema.   glucose blood test strip Commonly known as: Accu-Chek Aviva Plus Test BID Dx E13.9   lisinopril 40 MG tablet Commonly known as: ZESTRIL Take 1 tablet (40 mg total) by mouth daily.   metFORMIN 1000 MG tablet Commonly known as: GLUCOPHAGE Take 1 tablet (1,000  mg total) by mouth 2 (two) times daily with a meal.   metoprolol tartrate 100 MG tablet Commonly known as: LOPRESSOR TAKE 1 TABLET BY MOUTH TWICE A DAY   potassium chloride 10 MEQ CR capsule Commonly known as: MICRO-K Take 1 capsule (10 mEq total) by mouth daily as needed.   sildenafil 20 MG tablet Commonly known as: Revatio 2-5 pills once daily as needed for erectile dysfunction   Trulicity 1.5 LT/9.0ZE Sopn Generic drug: Dulaglutide Inject 1.5 mg into the skin once a week.         Objective:   BP (!) 161/79   Pulse 70   Ht 5' 11"  (1.803 m)   Wt (!) 384 lb (174.2 kg)   SpO2 95%   BMI 53.56 kg/m   Wt Readings from Last 3 Encounters:  05/26/21 (!) 384 lb (174.2 kg)  05/03/21 (!) 382 lb (173.3 kg)  01/19/21 (!) 388 lb (176 kg)    Physical Exam Vitals and nursing note reviewed.  Constitutional:      General: He is not in acute distress.    Appearance: He is well-developed. He is not diaphoretic.  Eyes:     General: No scleral icterus.    Conjunctiva/sclera: Conjunctivae normal.  Neck:     Thyroid: No thyromegaly.  Cardiovascular:     Rate and Rhythm: Normal rate and regular rhythm.     Heart sounds: Normal heart sounds. No murmur heard. Pulmonary:     Effort: Pulmonary effort is normal. No respiratory distress.     Breath sounds: Normal breath sounds. No wheezing.  Musculoskeletal:        General: Normal range of motion.     Cervical back: Neck supple.  Lymphadenopathy:     Cervical: No cervical adenopathy.  Skin:    General: Skin is warm and dry.     Findings: No rash.  Neurological:     Mental Status: He is alert and oriented to person, place, and time.     Coordination: Coordination normal.  Psychiatric:        Behavior: Behavior normal.      Assessment & Plan:   Problem List Items Addressed This Visit       Cardiovascular and Mediastinum   Hypertension associated with diabetes ([place])     Endocrine   Type 2 diabetes mellitus with other  specified complication ([place]) - Primary   Relevant Orders   CBC with Differential/Platelet   Bayer DCA Hb A1c Waived   Hyperlipidemia associated with type 2 diabetes mellitus ([place])   Other Visit Diagnoses     Leukocytosis, unspecified type       Relevant Orders   CBC with Differential/Platelet       Blood pressure up but had not taken his medicines today, continue current medicines and take them and monitor blood pressures closely over the next 2  weeks and call me with numbers.  A1c looks good today, continue current medicine. Follow up plan: Return in about 3 months (around 08/26/2021), or if symptoms worsen or fail to improve, for Diabetes and hypertension and cholesterol.  Counseling provided for all of the vaccine components Orders Placed This Encounter  Procedures   CBC with Differential/Platelet   Bayer DCA Hb A1c Waived    Caryl Pina, MD [facility] 05/26/2021, 2:41 PM

## 2021-05-27 LAB — CBC WITH DIFFERENTIAL/PLATELET
Basophils Absolute: 0.1 10*3/uL (ref 0.0–0.2)
Basos: 1 %
EOS (ABSOLUTE): 0.3 10*3/uL (ref 0.0–0.4)
Eos: 3 %
Hematocrit: 46.5 % (ref 37.5–51.0)
Hemoglobin: 15.1 g/dL (ref 13.0–17.7)
Immature Grans (Abs): 0 10*3/uL (ref 0.0–0.1)
Immature Granulocytes: 0 %
Lymphocytes Absolute: 2 10*3/uL (ref 0.7–3.1)
Lymphs: 22 %
MCH: 25.5 pg — ABNORMAL LOW (ref 26.6–33.0)
MCHC: 32.5 g/dL (ref 31.5–35.7)
MCV: 79 fL (ref 79–97)
Monocytes Absolute: 0.7 10*3/uL (ref 0.1–0.9)
Monocytes: 8 %
Neutrophils Absolute: 6.3 10*3/uL (ref 1.4–7.0)
Neutrophils: 66 %
Platelets: 262 10*3/uL (ref 150–450)
RBC: 5.91 x10E6/uL — ABNORMAL HIGH (ref 4.14–5.80)
RDW: 16.4 % — ABNORMAL HIGH (ref 11.6–15.4)
WBC: 9.4 10*3/uL (ref 3.4–10.8)

## 2021-06-22 ENCOUNTER — Ambulatory Visit: Payer: BLUE CROSS/BLUE SHIELD | Admitting: Nurse Practitioner

## 2021-06-22 ENCOUNTER — Ambulatory Visit (INDEPENDENT_AMBULATORY_CARE_PROVIDER_SITE_OTHER): Payer: BLUE CROSS/BLUE SHIELD

## 2021-06-22 ENCOUNTER — Encounter: Payer: Self-pay | Admitting: Nurse Practitioner

## 2021-06-22 ENCOUNTER — Other Ambulatory Visit: Payer: Self-pay

## 2021-06-22 VITALS — BP 165/81 | HR 77 | Temp 97.4°F | Ht 71.0 in | Wt 391.0 lb

## 2021-06-22 DIAGNOSIS — I517 Cardiomegaly: Secondary | ICD-10-CM | POA: Diagnosis not present

## 2021-06-22 DIAGNOSIS — R0602 Shortness of breath: Secondary | ICD-10-CM

## 2021-06-22 DIAGNOSIS — R0609 Other forms of dyspnea: Secondary | ICD-10-CM | POA: Insufficient documentation

## 2021-06-22 NOTE — Patient Instructions (Signed)
Shortness of Breath, Adult Shortness of breath means you have trouble breathing. Shortness of breath could be a sign of a medical problem. Follow these instructions at home:  Watch for any changes in your symptoms. Do not use any products that contain nicotine or tobacco, such as cigarettes, e-cigarettes, and chewing tobacco. Do not smoke. Smoking can cause shortness of breath. If you need help to quit smoking, ask your doctor. Avoid things that can make it harder to breathe, such as: Mold. Dust. Air pollution. Chemical smells. Things that can cause allergy symptoms (allergens), if you have allergies. Keep your living space clean. Use products that help remove mold and dust. Rest as needed. Slowly return to your normal activities. Take over-the-counter and prescription medicines only as told by your doctor. This includes oxygen therapy and inhaled medicines. Keep all follow-up visits as told by your doctor. This is important. Contact a doctor if: Your condition does not get better as soon as expected. You have a hard time doing your normal activities, even after you rest. You have new symptoms. Get help right away if: Your shortness of breath gets worse. You have trouble breathing when you are resting. You feel light-headed or you pass out (faint). You have a cough that is not helped by medicines. You cough up blood. You have pain with breathing. You have pain in your chest, arms, shoulders, or belly (abdomen). You have a fever. You cannot walk up stairs. You cannot exercise the way you normally do. These symptoms may represent a serious problem that is an emergency. Do not wait to see if the symptoms will go away. Get medical help right away. Call your local emergency services (911 in the [place] Do not drive yourself to the hospital. Summary Shortness of breath is when you have trouble breathing enough air. It can be a sign of a medical problem. Avoid things that make it hard for  you to breathe, such as smoking, pollution, mold, and dust. Watch for any changes in your symptoms. Contact your doctor if you do not get better or you get worse. This information is not intended to replace advice given to you by your health care provider. Make sure you discuss any questions you have with your health care provider. Document Revised: 04/29/2018 Document Reviewed: 04/29/2018 Elsevier Patient Education  2022 Elsevier Inc.  

## 2021-06-22 NOTE — Progress Notes (Signed)
Acute Office Visit  Subjective:    Patient ID: Jason Meyer, male    DOB: Mar 01, 1961, 60 y.o.   MRN: [id number]  Chief Complaint  Patient presents with   Shortness of Breath    Shortness of Breath This is a recurrent problem. The current episode started in the past 7 days. The problem occurs constantly. The problem has been unchanged. Pertinent negatives include no abdominal pain, chest pain, ear pain, leg swelling, neck pain, sore throat or swollen glands. The symptoms are aggravated by exercise. He has tried nothing for the symptoms.    Past Medical History:  Diagnosis Date   Allergy    Diabetes mellitus without complication ([place])    Hyperlipidemia    Hypertension     History reviewed. No pertinent surgical history.  Family History  Problem Relation Age of Onset   Heart attack Brother    Alzheimer's disease Mother    Heart attack Father    Stroke Father     Social History   Socioeconomic History   Marital status: Married    Spouse name: Not on file   Number of children: Not on file   Years of education: Not on file   Highest education level: Not on file  Occupational History   Not on file  Tobacco Use   Smoking status: Former    Packs/day: 1.00    Years: 33.00    Pack years: 33.00    Types: Cigarettes    Quit date: 12/12/2007    Years since quitting: 13.5   Smokeless tobacco: Never  Vaping Use   Vaping Use: Never used  Substance and Sexual Activity   Alcohol use: No   Drug use: No   Sexual activity: Not on file  Other Topics Concern   Not on file  Social History Narrative   Not on file   Social Determinants of Health   Financial Resource Strain: Not on file  Food Insecurity: Not on file  Transportation Needs: Not on file  Physical Activity: Not on file  Stress: Not on file  Social Connections: Not on file  Intimate Partner Violence: Not on file    Outpatient Medications Prior to Visit  Medication Sig Dispense Refill   Accu-Chek  Softclix Lancets lancets Test BID Dx E13.9 200 each 3   albuterol (PROVENTIL) (2.5 MG/3ML) 0.083% nebulizer solution Take 3 mLs by nebulization every 4 hours as needed for wheezing or shortness of breath. 90 mL 0   amLODipine (NORVASC) 10 MG tablet Take 1 tablet (10 mg total) by mouth daily. 90 tablet 3   atorvastatin (LIPITOR) 80 MG tablet TAKE 1 TABLET BY MOUTH EVERY DAY 90 tablet 0   blood glucose meter kit and supplies KIT Test BID Dx E13.9 1 each 0   diclofenac (VOLTAREN) 50 MG EC tablet Take 1 tablet (50 mg total) by mouth 2 (two) times daily. 60 tablet 3   Dulaglutide (TRULICITY) 1.5 BM/8.4XL SOPN Inject 1.5 mg into the skin once a week. 6 mL 3   empagliflozin (JARDIANCE) 10 MG TABS tablet Take 1 tablet (10 mg total) by mouth daily. 90 tablet 3   fluticasone (FLONASE) 50 MCG/ACT nasal spray Place 2 sprays into both nostrils daily. 16 g 6   furosemide (LASIX) 20 MG tablet Take 1 tablet (20 mg total) by mouth daily as needed for fluid or edema. 6 tablet 3   glucose blood (ACCU-CHEK AVIVA PLUS) test strip Test BID Dx E13.9 200 each 3   lisinopril (  ZESTRIL) 40 MG tablet Take 1 tablet (40 mg total) by mouth daily. 90 tablet 3   metFORMIN (GLUCOPHAGE) 1000 MG tablet Take 1 tablet (1,000 mg total) by mouth 2 (two) times daily with a meal. 180 tablet 3   metoprolol tartrate (LOPRESSOR) 100 MG tablet TAKE 1 TABLET BY MOUTH TWICE A DAY 180 tablet 0   potassium chloride (MICRO-K) 10 MEQ CR capsule Take 1 capsule (10 mEq total) by mouth daily as needed. 7 capsule 3   sildenafil (REVATIO) 20 MG tablet 2-5 pills once daily as needed for erectile dysfunction 50 tablet 3   No facility-administered medications prior to visit.    Allergies  Allergen Reactions   Penicillins     Review of Systems  HENT:  Negative for ear pain and sore throat.   Respiratory:  Positive for shortness of breath.   Cardiovascular:  Negative for chest pain and leg swelling.  Gastrointestinal:  Negative for abdominal pain.   Musculoskeletal:  Negative for neck pain.  All other systems reviewed and are negative.     Objective:    Physical Exam Vitals and nursing note reviewed.  Constitutional:      Appearance: He is well-developed. He is obese.  HENT:     Head: Normocephalic.     Nose: Nose normal.     Mouth/Throat:     Mouth: Mucous membranes are moist.  Eyes:     Conjunctiva/sclera: Conjunctivae normal.  Cardiovascular:     Rate and Rhythm: Normal rate and regular rhythm.     Pulses: Normal pulses.     Heart sounds: Normal heart sounds.  Pulmonary:     Breath sounds: Normal breath sounds.  Abdominal:     General: Bowel sounds are normal.  Skin:    General: Skin is warm.     Findings: No rash.  Neurological:     Mental Status: He is alert and oriented to person, place, and time.    BP (!) 165/81   Pulse 77   Temp (!) 97.4 F (36.3 C) (Temporal)   Ht _0  (1.803 m)   Wt (!) 391 lb (177.4 kg)   SpO2 96% Comment: 96% on room air, 86% while ambulating, 94% when putting 2lLof O2 on  BMI 54.53 kg/m  Wt Readings from Last 3 Encounters:  06/22/21 (!) 391 lb (177.4 kg)  05/26/21 (!) 384 lb (174.2 kg)  05/03/21 (!) 382 lb (173.3 kg)    Health Maintenance Due  Topic Date Due   COLONOSCOPY (Pts 45-33yr Insurance coverage will need to be confirmed)  Never done    There are no preventive care reminders to display for this patient.   Lab Results  Component Value Date   TSH 1.750 07/06/2014   Lab Results  Component Value Date   WBC 9.4 05/26/2021   HGB 15.1 05/26/2021   HCT 46.5 05/26/2021   MCV 79 05/26/2021   PLT 262 05/26/2021   Lab Results  Component Value Date   NA 139 01/19/2021   K 4.5 01/19/2021   CO2 20 01/19/2021   GLUCOSE 153 (H) 01/19/2021   BUN 9 01/19/2021   CREATININE 0.74 (L) 01/19/2021   BILITOT <0.2 01/19/2021   ALKPHOS 128 (H) 01/19/2021   AST 18 01/19/2021   ALT 18 01/19/2021   PROT 7.2 01/19/2021   ALBUMIN 4.4 01/19/2021   CALCIUM 9.6 01/19/2021    ANIONGAP 13 12/07/2017   Lab Results  Component Value Date   CHOL 151 01/19/2021   Lab  Results  Component Value Date   HDL 43 01/19/2021   Lab Results  Component Value Date   LDLCALC 84 01/19/2021   Lab Results  Component Value Date   TRIG 137 01/19/2021   Lab Results  Component Value Date   CHOLHDL 3.5 01/19/2021   Lab Results  Component Value Date   HGBA1C 6.2 05/26/2021       Assessment & Plan:   Problem List Items Addressed This Visit       Other   Shortness of breath - Primary    Worsening shortness of breath in the last 7 days.  Oxygen saturation in clinic 96%, drops to 86 with walking and pulse increases to 142 from 77.  Completed chest x-ray, patient reports in the past with symptoms similar to this was admitted into the hospital for pneumonia.  After assessment lungs are clear, no fever, headaches, vision changes nausea or vomiting.  Education provided to patient printed handouts given.  Results pending.       Relevant Orders   DG Chest 2 View     No orders of the defined types were placed in this encounter.    Ivy Lynn, NP

## 2021-06-22 NOTE — Assessment & Plan Note (Signed)
Worsening shortness of breath in the last 7 days.  Oxygen saturation in clinic 96%, drops to 86 with walking and pulse increases to 142 from 77.  Completed chest x-ray, patient reports in the past with symptoms similar to this was admitted into the hospital for pneumonia.  After assessment lungs are clear, no fever, headaches, vision changes nausea or vomiting.  Education provided to patient printed handouts given.  Results pending.

## 2021-06-24 ENCOUNTER — Other Ambulatory Visit: Payer: Self-pay | Admitting: Nurse Practitioner

## 2021-06-24 ENCOUNTER — Telehealth: Payer: Self-pay | Admitting: Nurse Practitioner

## 2021-06-24 DIAGNOSIS — I517 Cardiomegaly: Secondary | ICD-10-CM

## 2021-06-25 ENCOUNTER — Other Ambulatory Visit: Payer: Self-pay | Admitting: Family Medicine

## 2021-06-27 ENCOUNTER — Other Ambulatory Visit: Payer: Self-pay | Admitting: Family Medicine

## 2021-06-27 NOTE — Telephone Encounter (Signed)
Pt aware. He wants to know if this could be why his oxygen drops. Who will address his need for O2?

## 2021-06-29 NOTE — Telephone Encounter (Signed)
Talked to pt about referral and O2, he will let us know after cardio appt

## 2021-07-09 ENCOUNTER — Other Ambulatory Visit: Payer: Self-pay | Admitting: Family Medicine

## 2021-07-21 ENCOUNTER — Other Ambulatory Visit: Payer: Self-pay | Admitting: Family Medicine

## 2021-07-21 DIAGNOSIS — E785 Hyperlipidemia, unspecified: Secondary | ICD-10-CM

## 2021-07-21 DIAGNOSIS — E1169 Type 2 diabetes mellitus with other specified complication: Secondary | ICD-10-CM

## 2021-07-27 IMAGING — DX DG CHEST 2V
2 series · 2 of 2 positions shown · non-contrast
Comparison: 03/03/2015

CLINICAL DATA: Shortness of breath with exertion for 2 weeks

EXAM:
CHEST - 2 VIEW

[chest lat]
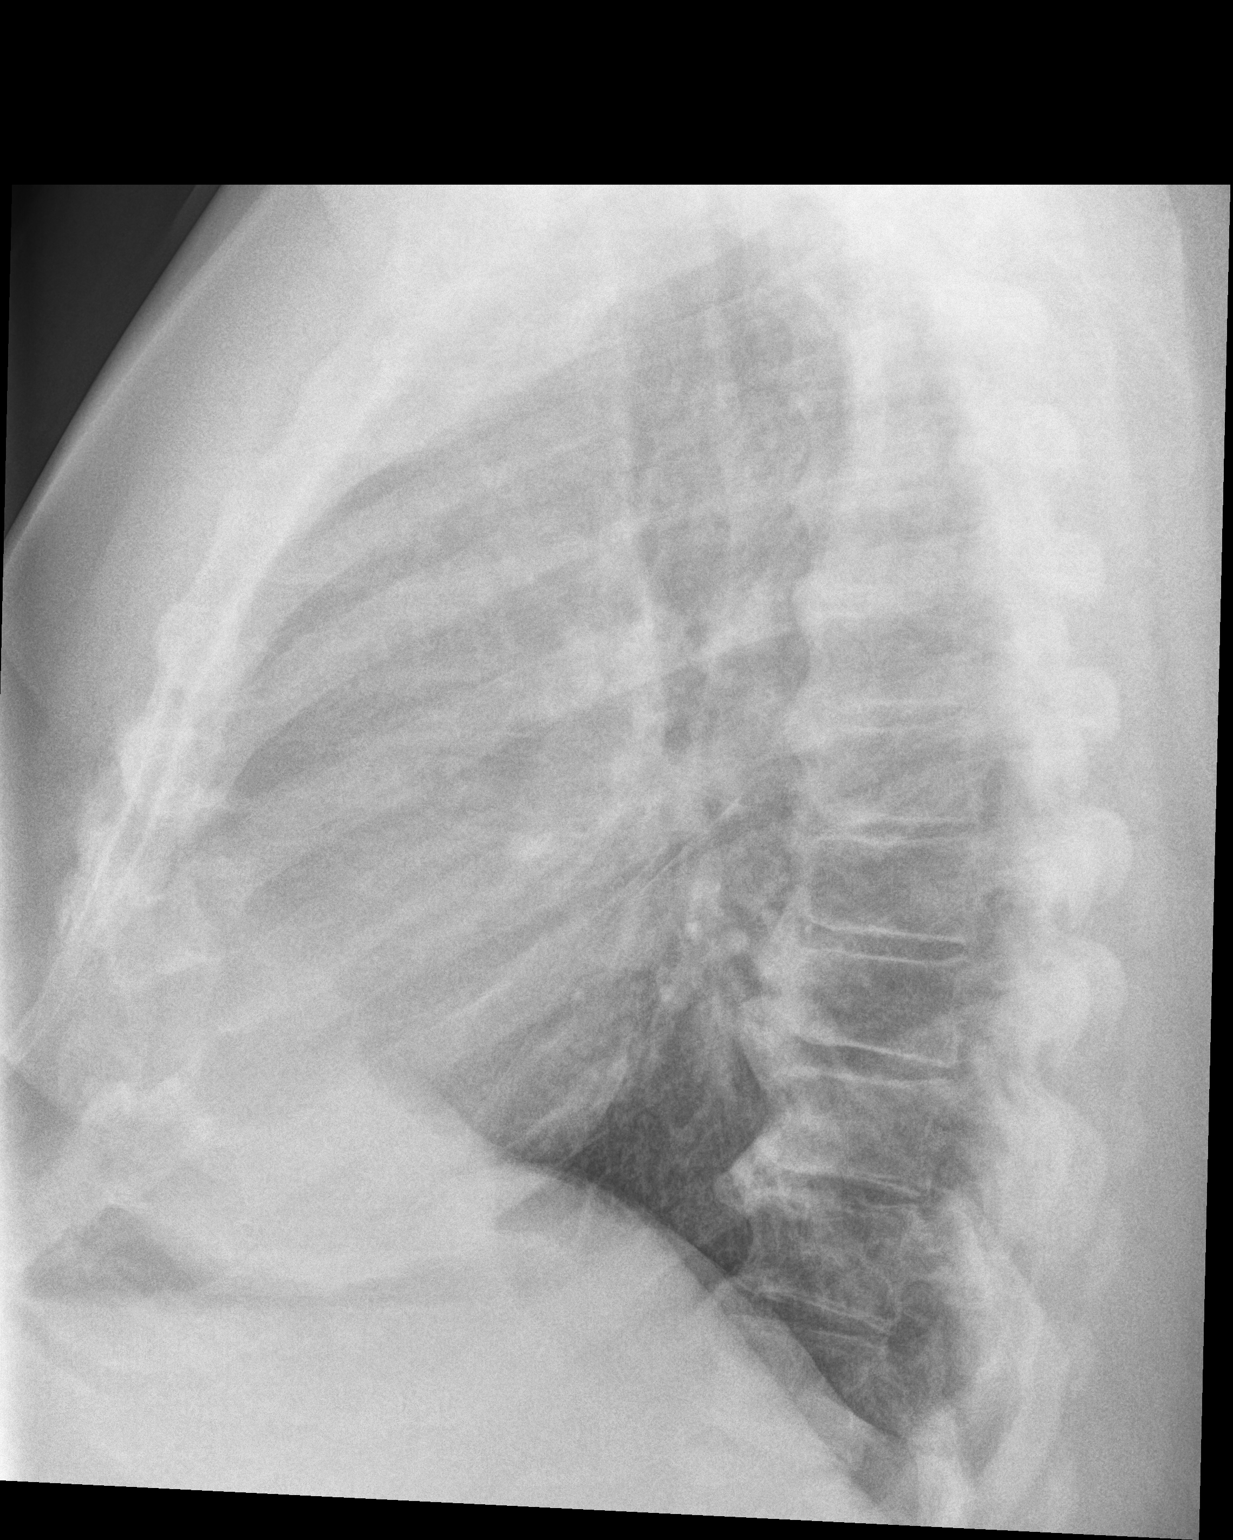

[chest pa]
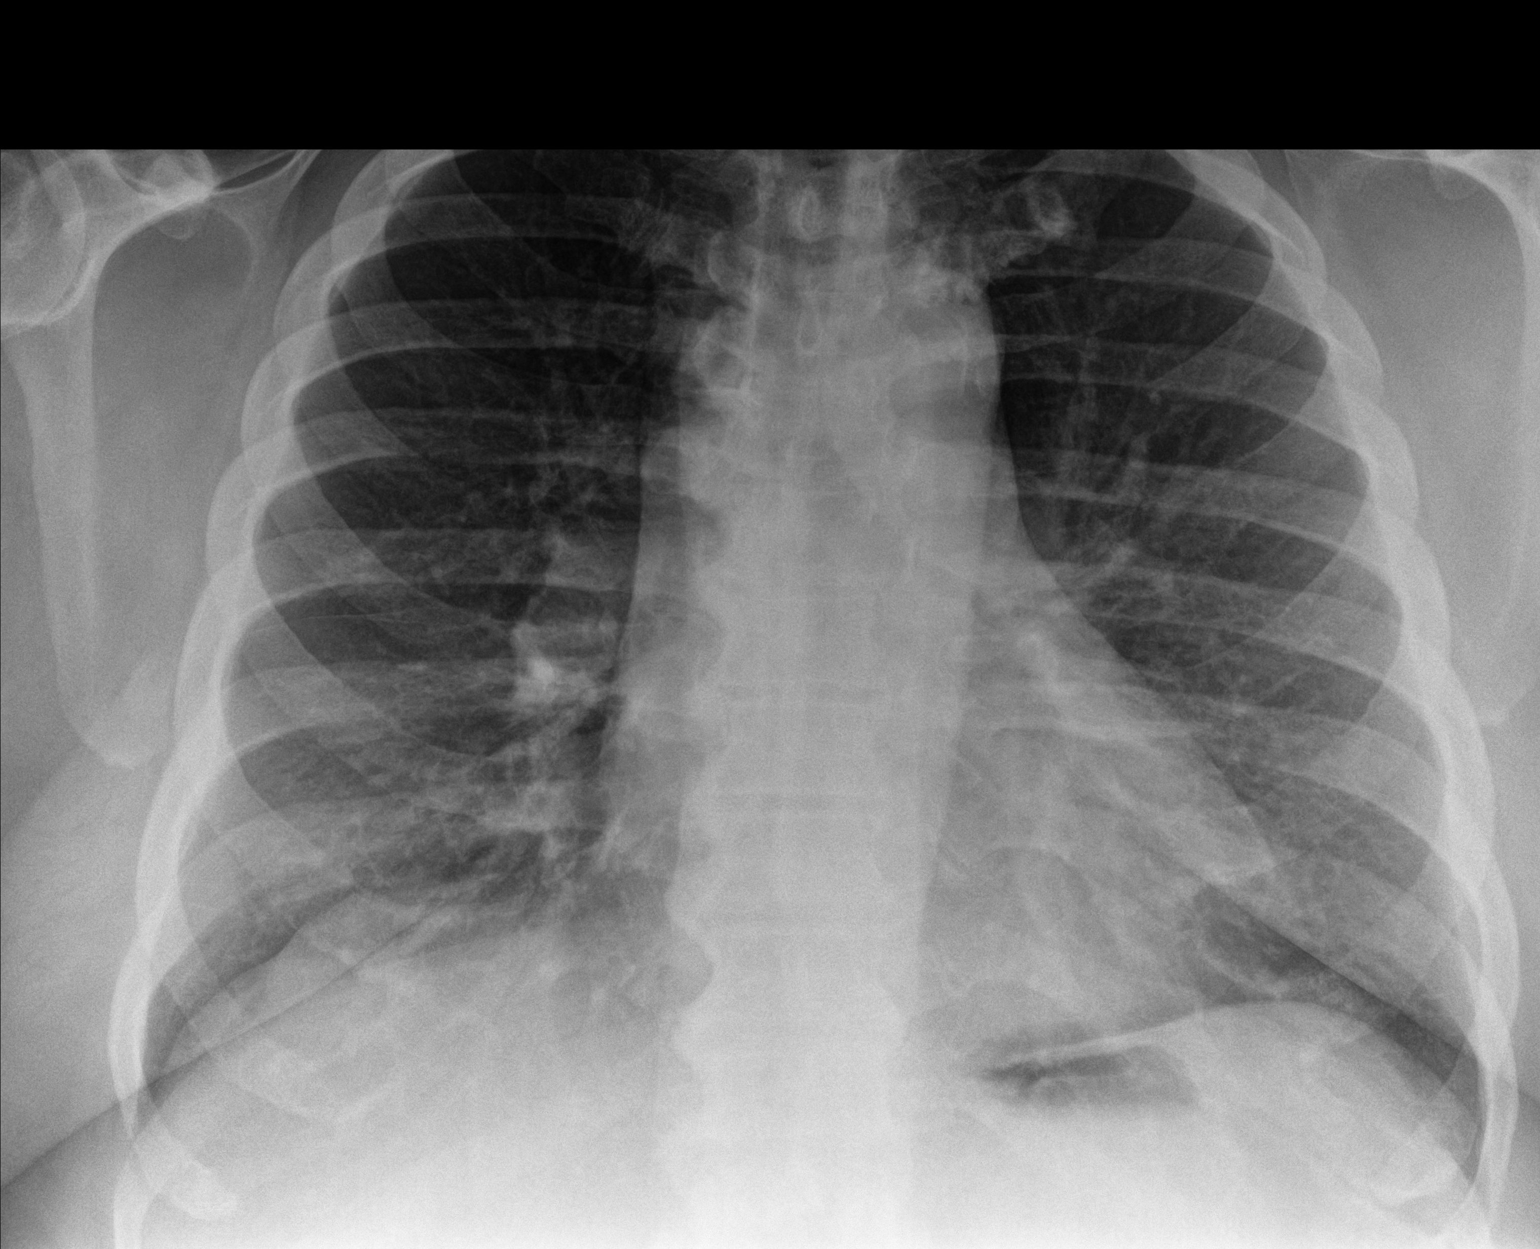

[2 of 2 positions shown; findings below may reference images not displayed]

FINDINGS: Midline trachea. Borderline cardiomegaly. No pleural effusion or
pneumothorax. Apparent pulmonary interstitial thickening on the
frontal radiograph is favored to be artifactual in the setting of
patient morbid obesity. No overt congestive failure or lobar
consolidation.

Thoracic spondylosis.
IMPRESSION: Borderline cardiomegaly, without acute disease.

## 2021-08-17 ENCOUNTER — Other Ambulatory Visit: Payer: Self-pay

## 2021-08-17 ENCOUNTER — Ambulatory Visit (HOSPITAL_COMMUNITY)
Admission: RE | Admit: 2021-08-17 | Discharge: 2021-08-17 | Disposition: A | Discharge status: Home / Self Care | Payer: BLUE CROSS/BLUE SHIELD | Source: Ambulatory Visit | Attending: Cardiology | Admitting: Cardiology

## 2021-08-17 ENCOUNTER — Other Ambulatory Visit (HOSPITAL_COMMUNITY)
Admission: RE | Admit: 2021-08-17 | Discharge: 2021-08-17 | Disposition: A | Discharge status: Home / Self Care | Payer: BLUE CROSS/BLUE SHIELD | Source: Ambulatory Visit | Attending: Cardiology | Admitting: Cardiology

## 2021-08-17 ENCOUNTER — Encounter: Payer: Self-pay | Admitting: Cardiology

## 2021-08-17 ENCOUNTER — Ambulatory Visit (INDEPENDENT_AMBULATORY_CARE_PROVIDER_SITE_OTHER): Payer: BLUE CROSS/BLUE SHIELD | Admitting: Cardiology

## 2021-08-17 VITALS — BP 170/85 | HR 76 | Ht 71.0 in | Wt 386.6 lb

## 2021-08-17 DIAGNOSIS — R7989 Other specified abnormal findings of blood chemistry: Secondary | ICD-10-CM | POA: Diagnosis not present

## 2021-08-17 DIAGNOSIS — R0600 Dyspnea, unspecified: Secondary | ICD-10-CM | POA: Insufficient documentation

## 2021-08-17 DIAGNOSIS — R0602 Shortness of breath: Secondary | ICD-10-CM | POA: Diagnosis not present

## 2021-08-17 DIAGNOSIS — R0609 Other forms of dyspnea: Secondary | ICD-10-CM

## 2021-08-17 DIAGNOSIS — I10 Essential (primary) hypertension: Secondary | ICD-10-CM

## 2021-08-17 DIAGNOSIS — I517 Cardiomegaly: Secondary | ICD-10-CM | POA: Diagnosis not present

## 2021-08-17 DIAGNOSIS — Z9189 Other specified personal risk factors, not elsewhere classified: Secondary | ICD-10-CM

## 2021-08-17 LAB — BASIC METABOLIC PANEL
Anion gap: 10 (ref 5–15)
BUN: 10 mg/dL (ref 6–20)
CO2: 26 mmol/L (ref 22–32)
Calcium: 9.2 mg/dL (ref 8.9–10.3)
Chloride: 103 mmol/L (ref 98–111)
Creatinine, Ser: 1.06 mg/dL (ref 0.61–1.24)
GFR, Estimated: >60 mL/min (ref >60)
Glucose, Bld: 105 mg/dL — ABNORMAL HIGH (ref 70–99)
Potassium: 3.9 mmol/L (ref 3.5–5.1)
Sodium: 139 mmol/L (ref 135–145)

## 2021-08-17 MED ORDER — IOHEXOL 350 MG/ML SOLN
80.0000 mL | Freq: Once | INTRAVENOUS | Status: AC | PRN
  Administered 2021-08-17: 80 mL via INTRAVENOUS

## 2021-08-17 NOTE — Patient Instructions (Signed)
Medication Instructions:  Your physician recommends that you continue on your current medications as directed. Please refer to the Current Medication list given to you today.  Labwork: BMET today  Testing/Procedures: Chest CTA  Your physician has requested that you have an echocardiogram. Echocardiography is a painless test that uses sound waves to create images of your heart. It provides your doctor with information about the size and shape of your heart and how well your heart's chambers and valves are working. This procedure takes approximately one hour. There are no restrictions for this procedure.  Follow-Up: Your physician recommends that you schedule a follow-up appointment in: pending  Any Other Special Instructions Will Be Listed Below (If Applicable). You have been referred to [facility]  If you need a refill on your cardiac medications before your next appointment, please call your pharmacy.

## 2021-08-17 NOTE — Progress Notes (Signed)
Cardiology Office Note  Date: 08/17/2021   ID: Jason Meyer, DOB 1961/05/14, MRN [id number]  PCP:  Dettinger, Fransisca Kaufmann, MD  Cardiologist:  Rozann Lesches, MD Electrophysiologist:  None   Chief Complaint  Patient presents with   Shortness of Breath     History of Present Illness: Jason Meyer is a 60 y.o. male referred for cardiology consultation by Ms.Ijaola NP with a history of shortness of breath.  Office note indicates ambulatory hypoxemia as well with oxygen saturation dropping to 86% on room air when he was seen in July.  He was sent for a chest x-ray which reported borderline cardiomegaly and no other acute process.  It does not appear that he has had any formal pulmonary evaluation or further investigation of his hypoxemia.  He presents today describing shortness of breath with activity over the last few months.  He also mentions being hospitalized years ago with pneumonia and reports a relative breathlessness since that time.  He has chronic leg swelling that is consistent with lymphedema on examination, does not use compression techniques for treatment.  Also states that he was referred for sleep apnea evaluation in the past but did not go through with testing.  He has a longstanding history of hypertension, currently on multimodal treatment and without optimal control.  He has no known history of cardiomyopathy based on available information.   Past Medical History:  Diagnosis Date   Allergy    Hyperlipidemia    Hypertension    Type 2 diabetes mellitus ([place])     Past Surgical History:  Procedure Laterality Date   NO PAST SURGERIES      Current Outpatient Medications  Medication Sig Dispense Refill   albuterol (PROVENTIL) (2.5 MG/3ML) 0.083% nebulizer solution Take 3 mLs by nebulization every 4 hours as needed for wheezing or shortness of breath. 90 mL 1   amLODipine (NORVASC) 10 MG tablet Take 1 tablet (10 mg total) by mouth daily. 90 tablet 3    atorvastatin (LIPITOR) 80 MG tablet TAKE 1 TABLET BY MOUTH EVERY DAY 30 tablet 0   diclofenac (VOLTAREN) 50 MG EC tablet TAKE 1 TABLET BY MOUTH TWICE A DAY 60 tablet 1   Dulaglutide (TRULICITY) 1.5 UD/1.4HF SOPN Inject 1.5 mg into the skin once a week. 6 mL 3   empagliflozin (JARDIANCE) 10 MG TABS tablet Take 1 tablet (10 mg total) by mouth daily. 90 tablet 3   fluticasone (FLONASE) 50 MCG/ACT nasal spray Place 2 sprays into both nostrils daily. 16 g 6   lisinopril (ZESTRIL) 40 MG tablet Take 1 tablet (40 mg total) by mouth daily. 90 tablet 3   metFORMIN (GLUCOPHAGE) 1000 MG tablet Take 1 tablet (1,000 mg total) by mouth 2 (two) times daily with a meal. 180 tablet 3   metoprolol tartrate (LOPRESSOR) 100 MG tablet TAKE 1 TABLET BY MOUTH TWICE A DAY 180 tablet 0   sildenafil (REVATIO) 20 MG tablet 2-5 pills once daily as needed for erectile dysfunction 50 tablet 3   tamsulosin (FLOMAX) 0.4 MG CAPS capsule Take 0.4 mg by mouth daily.     Accu-Chek Softclix Lancets lancets Test BID Dx E13.9 (Patient not taking: Reported on 08/17/2021) 200 each 3   blood glucose meter kit and supplies KIT Test BID Dx E13.9 (Patient not taking: Reported on 08/17/2021) 1 each 0   furosemide (LASIX) 20 MG tablet Take 1 tablet (20 mg total) by mouth daily as needed for fluid or edema. (Patient not taking: Reported  on 08/17/2021) 6 tablet 3   glucose blood (ACCU-CHEK AVIVA PLUS) test strip Test BID Dx E13.9 (Patient not taking: Reported on 08/17/2021) 200 each 3   potassium chloride (MICRO-K) 10 MEQ CR capsule Take 1 capsule (10 mEq total) by mouth daily as needed. (Patient not taking: Reported on 08/17/2021) 7 capsule 3   No current facility-administered medications for this visit.   Allergies:  Penicillins   Social History: The patient  reports that he quit smoking about 13 years ago. His smoking use included cigarettes. He has a 33.00 pack-year smoking history. He has never used smokeless tobacco. He reports that he does not  drink alcohol and does not use drugs.   Family History: The patient's family history includes Alzheimer's disease in his mother; Heart attack in his brother and father; Stroke in his father.   ROS: No orthopnea or PND.  Physical Exam: VS:  BP (!) 170/85   Pulse 76   Ht _0  (1.803 m)   Wt (!) 386 lb 9.6 oz (175.4 kg)   SpO2 (!) 85% Comment: pulse ox 85 after walking 94 before  BMI 53.92 kg/m , BMI Body mass index is 53.92 kg/m.  Wt Readings from Last 3 Encounters:  08/17/21 (!) 386 lb 9.6 oz (175.4 kg)  06/22/21 (!) 391 lb (177.4 kg)  05/26/21 (!) 384 lb (174.2 kg)    General: Morbidly obese male, no distress. HEENT: Conjunctiva and lids normal, wearing a mask. Neck: Supple, increased girth with difficulty assessing JVP, no thyromegaly. Lungs: Decreased breath sounds without wheezing, nonlabored breathing at rest. Cardiac: Regular rate and rhythm, S4, no S3 or significant systolic murmur, no pericardial rub. Abdomen: Obese, bowel sounds present, no guarding or rebound. Extremities: Chronic appearing bilateral lymphedema. Skin: Warm and dry. Musculoskeletal: No kyphosis. Neuropsychiatric: Alert and oriented x3, affect grossly appropriate.  ECG:  An ECG dated 08/19/2015 was personally reviewed today and demonstrated:  Sinus bradycardia with prolonged PR interval and low voltage.  Recent Labwork: 01/19/2021: ALT 18; AST 18; BUN 9; Creatinine, Ser 0.74; Potassium 4.5; Sodium 139 05/26/2021: Hemoglobin 15.1; Platelets 262     Component Value Date/Time   CHOL 151 01/19/2021 1002   TRIG 137 01/19/2021 1002   HDL 43 01/19/2021 1002   CHOLHDL 3.5 01/19/2021 1002   LDLCALC 84 01/19/2021 1002    Other Studies Reviewed Today:  Chest x-ray 06/22/2021: FINDINGS: Midline trachea. Borderline cardiomegaly. No pleural effusion or pneumothorax. Apparent pulmonary interstitial thickening on the frontal radiograph is favored to be artifactual in the setting of patient morbid obesity. No  overt congestive failure or lobar consolidation.   Thoracic spondylosis.   IMPRESSION: Borderline cardiomegaly, without acute disease.  Assessment and Plan:  1.  Dyspnea on exertion with ambulatory hypoxia in a morbidly obese 60 year old male.  He reports a relatively longstanding history of shortness of breath since being hospitalized with pneumonia years ago, but worsening symptoms in the last few months.  He does not describe any cough or hemoptysis.  He has chronic appearing lymphedema on examination.  Chest x-ray in July showed no obvious acute process.  Borderline cardiomegaly described.  Plan is to obtain an echocardiogram to assess cardiac structure and function, also a chest CTA for more detailed lung examination and also exclusion of pulmonary embolus.  Formal referral being made to [facility] for further investigation, he is at high risk for OSA/OHS.  2.  Essential hypertension, blood pressure control not optimal today.  This is complicated by morbid obesity and recurring  hypoxia.  Current medications include Lopressor, Norvasc, lisinopril, and Lasix with potassium supplement.  3.  Chronic appearing lymphedema.  I suspect that referral for PT to consider mechanical compression would be the most effective treatment option at this time, however clearly weight loss is also indicated.  Also need to exclude component of cor pulmonale.  Medication Adjustments/Labs and Tests Ordered: Current medicines are reviewed at length with the patient today.  Concerns regarding medicines are outlined above.   Tests Ordered: Orders Placed This Encounter  Procedures   CT Angio Chest Pulmonary Embolism (PE) W or WO Contrast   Basic metabolic panel   Ambulatory referral to Pulmonology   ECHOCARDIOGRAM COMPLETE     Medication Changes: No orders of the defined types were placed in this encounter.   Disposition:  Follow up  test results.  Signed, Satira Sark, MD, [facility] 08/17/2021  11:22 AM    [facility] [place], [place], [place] [address] Phone: [phone]; Fax: [phone]

## 2021-08-18 ENCOUNTER — Telehealth: Payer: Self-pay | Admitting: *Deleted

## 2021-08-18 NOTE — Telephone Encounter (Signed)
-----   Message from Jonelle Sidle, MD sent at 08/17/2021  5:06 PM EDT ----- Results reviewed.  No evidence of pulmonary embolus which was main question with ambulatory hypoxia and shortness of breath, also no pleural effusions.  He does have significant coronary artery calcification which although not being a reason for his ambulatory hypoxia, could contribute to dyspnea on exertion.  Would keep pulmonary evaluation in place for further work-up.  Echocardiogram ordered as well which we will review.  Please schedule office follow-up in [place] in the next month for further evaluation.  He is already on statin therapy and Jardiance.

## 2021-08-18 NOTE — Telephone Encounter (Signed)
Patient informed and verbalized understanding of plan. Copy sent to PCP 

## 2021-08-22 ENCOUNTER — Other Ambulatory Visit: Payer: Self-pay | Admitting: Family Medicine

## 2021-08-22 DIAGNOSIS — E1169 Type 2 diabetes mellitus with other specified complication: Secondary | ICD-10-CM

## 2021-08-22 DIAGNOSIS — E785 Hyperlipidemia, unspecified: Secondary | ICD-10-CM

## 2021-08-26 ENCOUNTER — Other Ambulatory Visit: Payer: Self-pay | Admitting: Family Medicine

## 2021-08-26 ENCOUNTER — Ambulatory Visit: Payer: BLUE CROSS/BLUE SHIELD | Admitting: Family Medicine

## 2021-08-30 ENCOUNTER — Encounter: Payer: Self-pay | Admitting: Family Medicine

## 2021-09-06 ENCOUNTER — Ambulatory Visit (HOSPITAL_COMMUNITY)
Admission: RE | Admit: 2021-09-06 | Discharge: 2021-09-06 | Disposition: A | Discharge status: Home / Self Care | Payer: BLUE CROSS/BLUE SHIELD | Source: Ambulatory Visit | Attending: Cardiology | Admitting: Cardiology

## 2021-09-06 ENCOUNTER — Other Ambulatory Visit: Payer: Self-pay

## 2021-09-06 DIAGNOSIS — R0602 Shortness of breath: Secondary | ICD-10-CM | POA: Diagnosis not present

## 2021-09-06 DIAGNOSIS — R0600 Dyspnea, unspecified: Secondary | ICD-10-CM | POA: Insufficient documentation

## 2021-09-06 DIAGNOSIS — R0609 Other forms of dyspnea: Secondary | ICD-10-CM

## 2021-09-06 LAB — ECHOCARDIOGRAM COMPLETE
Area-P 1/2: 3.03 cm2
S' Lateral: 3 cm

## 2021-09-06 NOTE — Progress Notes (Signed)
*  PRELIMINARY RESULTS* Echocardiogram 2D Echocardiogram has been performed. I.V. was attempted by two different nurses, both unsuccessful. Not able to give Definity.  Stacey Drain 09/06/2021, 4:27 PM

## 2021-09-07 ENCOUNTER — Telehealth: Payer: Self-pay | Admitting: *Deleted

## 2021-09-07 NOTE — Telephone Encounter (Signed)
-----   Message from Jonelle Sidle, MD sent at 09/06/2021  4:47 PM EDT ----- Results reviewed.  Echocardiogram shows normal LVEF at 60 to 65% with moderate LVH.  RV size and contraction grossly normal as well.

## 2021-09-07 NOTE — Telephone Encounter (Signed)
Patient informed. Copy sent to PCP °

## 2021-09-16 ENCOUNTER — Other Ambulatory Visit: Payer: Self-pay

## 2021-09-16 ENCOUNTER — Encounter: Payer: Self-pay | Admitting: Family Medicine

## 2021-09-16 ENCOUNTER — Ambulatory Visit: Payer: BLUE CROSS/BLUE SHIELD | Admitting: Family Medicine

## 2021-09-16 VITALS — BP 166/88 | HR 74 | Temp 97.5°F | Ht 71.0 in | Wt 393.0 lb

## 2021-09-16 DIAGNOSIS — E1159 Type 2 diabetes mellitus with other circulatory complications: Secondary | ICD-10-CM | POA: Diagnosis not present

## 2021-09-16 DIAGNOSIS — E1169 Type 2 diabetes mellitus with other specified complication: Secondary | ICD-10-CM

## 2021-09-16 DIAGNOSIS — I517 Cardiomegaly: Secondary | ICD-10-CM

## 2021-09-16 DIAGNOSIS — E785 Hyperlipidemia, unspecified: Secondary | ICD-10-CM

## 2021-09-16 DIAGNOSIS — R0681 Apnea, not elsewhere classified: Secondary | ICD-10-CM

## 2021-09-16 DIAGNOSIS — R0602 Shortness of breath: Secondary | ICD-10-CM

## 2021-09-16 DIAGNOSIS — I10 Essential (primary) hypertension: Secondary | ICD-10-CM | POA: Diagnosis not present

## 2021-09-16 DIAGNOSIS — I152 Hypertension secondary to endocrine disorders: Secondary | ICD-10-CM

## 2021-09-16 LAB — BAYER DCA HB A1C WAIVED: HB A1C (BAYER DCA - WAIVED): 6.2 % — ABNORMAL HIGH (ref 4.8–5.6)

## 2021-09-16 MED ORDER — METOPROLOL TARTRATE 100 MG PO TABS: 100.0000 mg | ORAL_TABLET | Freq: Two times a day (BID) | ORAL | 3 refills | Status: DC

## 2021-09-16 MED ORDER — ATORVASTATIN CALCIUM 80 MG PO TABS: 80.0000 mg | ORAL_TABLET | Freq: Every day | ORAL | 3 refills | Status: DC

## 2021-09-16 NOTE — Progress Notes (Signed)
BP (!) 166/88   Pulse 74   Temp (!) 97.5 F (36.4 C)   Ht 5' 11"  (1.803 m)   Wt (!) 393 lb (178.3 kg)   SpO2 94%   BMI 54.81 kg/m    Subjective:   Patient ID: Jason Meyer, male    DOB: [birth year], 60 y.o.   MRN: [id number]  HPI: Jason Meyer is a 60 y.o. male presenting on 09/16/2021 for Diabetes   HPI Type 2 diabetes mellitus Patient comes in today for recheck of his diabetes. Patient has been currently taking metformin and trulicity and jardiance, A1c was good at 6.2 today. Patient is currently on an ACE inhibitor/ARB. Patient has not seen an ophthalmologist this year. Patient denies any issues with their feet. The symptom started onset as an adult hypertension and ARE RELATED TO DM   Hyperlipidemia Patient is coming in for recheck of his hyperlipidemia. The patient is currently taking atorvastatin. They deny any issues with myalgias or history of liver damage from it. They deny any focal numbness or weakness or chest pain.   Hypertension Patient is currently on amlodipine and lisinopril and metoprolol, and their blood pressure today is 166/80. Patient denies any lightheadedness or dizziness. Patient denies headaches, blurred vision, chest pains, shortness of breath, or weakness. Denies any side effects from medication and is content with current medication.   Patient is short of breath on exertion has seen cardiology for this and now has an appointment with pulmonology.  He is also had an x-ray and CT scan.  He also is going to be set up for sleep apnea study because he does have some apnea symptoms where he wakes up feeling like he has to catch his breath  Relevant past medical, surgical, family and social history reviewed and updated as indicated. Interim medical history since our last visit reviewed. Allergies and medications reviewed and updated.  Review of Systems  Constitutional:  Negative for chills and fever.  Eyes:  Negative for visual disturbance.   Respiratory:  Negative for shortness of breath and wheezing.   Cardiovascular:  Negative for chest pain and leg swelling.  Musculoskeletal:  Negative for back pain and gait problem.  Skin:  Negative for rash.  Neurological:  Negative for dizziness, weakness and numbness.  All other systems reviewed and are negative.  Per HPI unless specifically indicated above   Allergies as of 09/16/2021       Reactions   Penicillins         Medication List        Accurate as of September 16, 2021 10:27 AM. If you have any questions, ask your nurse or doctor.          Accu-Chek Softclix Lancets lancets Test BID Dx E13.9   albuterol (2.5 MG/3ML) 0.083% nebulizer solution Commonly known as: PROVENTIL Take 3 mLs by nebulization every 4 hours as needed for wheezing or shortness of breath.   amLODipine 10 MG tablet Commonly known as: NORVASC Take 1 tablet (10 mg total) by mouth daily.   atorvastatin 80 MG tablet Commonly known as: LIPITOR Take 1 tablet (80 mg total) by mouth daily.   blood glucose meter kit and supplies Kit Test BID Dx E13.9   diclofenac 50 MG EC tablet Commonly known as: VOLTAREN TAKE 1 TABLET BY MOUTH TWICE A DAY   empagliflozin 10 MG Tabs tablet Commonly known as: Jardiance Take 1 tablet (10 mg total) by mouth daily.   fluticasone 50 MCG/ACT nasal spray  Commonly known as: FLONASE Place 2 sprays into both nostrils daily.   furosemide 20 MG tablet Commonly known as: LASIX Take 1 tablet (20 mg total) by mouth daily as needed for fluid or edema.   glucose blood test strip Commonly known as: Accu-Chek Aviva Plus Test BID Dx E13.9   lisinopril 40 MG tablet Commonly known as: ZESTRIL Take 1 tablet (40 mg total) by mouth daily.   metFORMIN 1000 MG tablet Commonly known as: GLUCOPHAGE Take 1 tablet (1,000 mg total) by mouth 2 (two) times daily with a meal.   metoprolol tartrate 100 MG tablet Commonly known as: LOPRESSOR Take 1 tablet (100 mg total) by  mouth 2 (two) times daily.   potassium chloride 10 MEQ CR capsule Commonly known as: MICRO-K Take 1 capsule (10 mEq total) by mouth daily as needed.   sildenafil 20 MG tablet Commonly known as: Revatio 2-5 pills once daily as needed for erectile dysfunction   tamsulosin 0.4 MG Caps capsule Commonly known as: FLOMAX Take 0.4 mg by mouth daily.   Trulicity 1.5 GE/9.5MW Sopn Generic drug: Dulaglutide Inject 1.5 mg into the skin once a week.         Objective:   BP (!) 166/88   Pulse 74   Temp (!) 97.5 F (36.4 C)   Ht 5' 11"  (1.803 m)   Wt (!) 393 lb (178.3 kg)   SpO2 94%   BMI 54.81 kg/m   Wt Readings from Last 3 Encounters:  09/16/21 (!) 393 lb (178.3 kg)  08/17/21 (!) 386 lb 9.6 oz (175.4 kg)  06/22/21 (!) 391 lb (177.4 kg)    Physical Exam Vitals and nursing note reviewed.  Constitutional:      General: He is not in acute distress.    Appearance: He is well-developed. He is not diaphoretic.  Eyes:     General: No scleral icterus.    Conjunctiva/sclera: Conjunctivae normal.  Neck:     Thyroid: No thyromegaly.  Cardiovascular:     Rate and Rhythm: Normal rate and regular rhythm.     Heart sounds: Normal heart sounds. No murmur heard. Pulmonary:     Effort: Pulmonary effort is normal. No respiratory distress.     Breath sounds: Normal breath sounds. No wheezing.  Musculoskeletal:        General: Swelling (2+ edema bilateral lower extremity) present. Normal range of motion.     Cervical back: Neck supple.  Lymphadenopathy:     Cervical: No cervical adenopathy.  Skin:    General: Skin is warm and dry.     Findings: No rash.  Neurological:     Mental Status: He is alert and oriented to person, place, and time.     Coordination: Coordination normal.  Psychiatric:        Behavior: Behavior normal.      Assessment & Plan:   Problem List Items Addressed This Visit       Cardiovascular and Mediastinum   Hypertension associated with diabetes ([place])    Relevant Medications   atorvastatin (LIPITOR) 80 MG tablet   metoprolol tartrate (LOPRESSOR) 100 MG tablet     Endocrine   Type 2 diabetes mellitus with other specified complication (HCC) - Primary   Relevant Medications   atorvastatin (LIPITOR) 80 MG tablet   Other Relevant Orders   Bayer DCA Hb A1c Waived   Lipid panel   Hyperlipidemia associated with type 2 diabetes mellitus (HCC)   Relevant Medications   atorvastatin (LIPITOR) 80 MG tablet  metoprolol tartrate (LOPRESSOR) 100 MG tablet   Other Visit Diagnoses     Cardiomegaly       Relevant Medications   atorvastatin (LIPITOR) 80 MG tablet   metoprolol tartrate (LOPRESSOR) 100 MG tablet   Essential hypertension       Relevant Medications   atorvastatin (LIPITOR) 80 MG tablet   metoprolol tartrate (LOPRESSOR) 100 MG tablet   Apnea       Exertional shortness of breath           He is going to be set up for a sleep study and pulmonology referral, I think both are necessary, it sounds like he has sleep apnea.  Instructed patient that if he does not get an appointment with sleep study soon that he can call us and we could get it set up for him.  He has a pulmonology appointment for 10/13/2021. Follow up plan: Return in about 3 months (around 12/17/2021), or if symptoms worsen or fail to improve, for dm and htn and hld.  Counseling provided for all of the vaccine components Orders Placed This Encounter  Procedures   Bayer [place] Hb A1c Waived   Lipid panel    Caryl Pina, MD [facility] 09/16/2021, 10:27 AM

## 2021-09-17 LAB — LIPID PANEL
Chol/HDL Ratio: 4.3 ratio (ref 0.0–5.0)
Cholesterol, Total: 147 mg/dL (ref 100–199)
HDL: 34 mg/dL — ABNORMAL LOW (ref >39)
LDL Chol Calc (NIH): 95 mg/dL (ref 0–99)
Triglycerides: 97 mg/dL (ref 0–149)
VLDL Cholesterol Cal: 18 mg/dL (ref 5–40)

## 2021-09-21 ENCOUNTER — Ambulatory Visit: Payer: BLUE CROSS/BLUE SHIELD | Admitting: Nurse Practitioner

## 2021-09-25 NOTE — Progress Notes (Signed)
Cardiology Office Note  Date: 09/26/2021   ID: Jason Meyer, DOB [birth year], MRN [id number]  PCP:  Dettinger, Fransisca Kaufmann, MD  Cardiologist:  Rozann Lesches, MD Electrophysiologist:  None   Chief Complaint: 1 month follow-up after CT  History of Present Illness: Jason Meyer is a 60 y.o. male with a history of HTN, DM 2, HLD, morbid obesity.  He was last seen by Dr. Domenic Polite on 08/17/2022 with complaint of shortness of breath.  He had ambulatory hypoxemia with oxygen saturation dropping to 86% on room air when he was seen in July.  He was sent for a chest x-ray which reported borderline cardiomegaly with no other acute process.  There was no evidence of previous formal pulmonary evaluation for further investigation of his hypoxemia.  He had been describing shortness of breath with activity over the prior few months.  He had chronic leg swelling that was consistent with lymphedema on examination.  He had been previously referred for sleep evaluation in the past but did not go through with testing.  Plan was to obtain an echocardiogram to assess cardiac structure and function with chest CT for more detailed lung examination and exclusion of pulmonary embolus.  Pulmonary referral was made to pulmonology for further investigation.  He was high risk for OSA and OHS.  BP control was not optimal on that visit.  BP complicated by morbid obesity and recurrent hypoxia.  Current medications including Lopressor, Norvasc, lisinopril, Lasix and potassium.  He was referred to PT for chronic appearing lymphedema recurrent through the ration of mechanical compression.  He is here today for follow-up on recent echocardiogram, CTA chest to rule out PE.  He continues to complain of shortness of breath.  He states this all started around June and continues to recur states he has significant activity intolerance and shortness of breath when performing any type of exertional activity.  Echocardiogram9/27/2022  EF 60 to 65%.  Left ventricular endocardial border not optimally defined to evaluate regional wall motion.  There was moderate concentric LVH.  Diastolic parameters were normal.  Trivial MR. Recent CT to rule out PE notedModerate to severe coronary artery atherosclerosis greater than proximal LAD.  He states he recently had a brother who passed away from MI at age 68.  He states his father had 8 MIs and 7 strokes starting at an early age.  We discussed getting a coronary CT and possible FFR or Lexiscan stress test.  He has an upcoming visit with Dr. Melvyn Novas pulmonology in November.  His blood pressure is elevated today 188/80.  He states his systolic blood pressures have been running 150s to 160s at home.  His current cardiac regimen includes; amlodipine 10 mg daily, lisinopril 40 mg daily, metoprolol 100 mg p.o. twice daily, atorvastatin 80 mg daily, furosemide 20 mg as needed, potassium chloride 10 M EQ daily.   Past Medical History:  Diagnosis Date   Allergy    Hyperlipidemia    Hypertension    Type 2 diabetes mellitus ([place])     Past Surgical History:  Procedure Laterality Date   NO PAST SURGERIES      Current Outpatient Medications  Medication Sig Dispense Refill   Accu-Chek Softclix Lancets lancets Test BID Dx E13.9 200 each 3   albuterol (PROVENTIL) (2.5 MG/3ML) 0.083% nebulizer solution Take 3 mLs by nebulization every 4 hours as needed for wheezing or shortness of breath. 90 mL 1   amLODipine (NORVASC) 10 MG tablet Take 1 tablet (10  mg total) by mouth daily. 90 tablet 3   atorvastatin (LIPITOR) 80 MG tablet Take 1 tablet (80 mg total) by mouth daily. 90 tablet 3   blood glucose meter kit and supplies KIT Test BID Dx E13.9 1 each 0   diclofenac (VOLTAREN) 50 MG EC tablet TAKE 1 TABLET BY MOUTH TWICE A DAY 60 tablet 1   Dulaglutide (TRULICITY) 1.5 EZ/6.6QH SOPN Inject 1.5 mg into the skin once a week. 6 mL 3   empagliflozin (JARDIANCE) 10 MG TABS tablet Take 1 tablet (10 mg total) by  mouth daily. 90 tablet 3   fluticasone (FLONASE) 50 MCG/ACT nasal spray Place 2 sprays into both nostrils daily. 16 g 6   furosemide (LASIX) 20 MG tablet Take 1 tablet (20 mg total) by mouth daily as needed for fluid or edema. 6 tablet 3   glucose blood (ACCU-CHEK AVIVA PLUS) test strip Test BID Dx E13.9 200 each 3   hydrALAZINE (APRESOLINE) 50 MG tablet Take 1 tablet (50 mg total) by mouth 2 (two) times daily. 180 tablet 1   lisinopril (ZESTRIL) 40 MG tablet Take 1 tablet (40 mg total) by mouth daily. 90 tablet 3   metFORMIN (GLUCOPHAGE) 1000 MG tablet Take 1 tablet (1,000 mg total) by mouth 2 (two) times daily with a meal. 180 tablet 3   metoprolol tartrate (LOPRESSOR) 100 MG tablet Take 1 tablet (100 mg total) by mouth 2 (two) times daily. 180 tablet 3   potassium chloride (MICRO-K) 10 MEQ CR capsule Take 1 capsule (10 mEq total) by mouth daily as needed. 7 capsule 3   sildenafil (REVATIO) 20 MG tablet 2-5 pills once daily as needed for erectile dysfunction 50 tablet 3   tamsulosin (FLOMAX) 0.4 MG CAPS capsule Take 0.4 mg by mouth daily.     No current facility-administered medications for this visit.   Allergies:  Penicillins   Social History: The patient  reports that he quit smoking about 13 years ago. His smoking use included cigarettes. He has a 33.00 pack-year smoking history. He has never used smokeless tobacco. He reports that he does not drink alcohol and does not use drugs.   Family History: The patient's family history includes Alzheimer's disease in his mother; Heart attack in his brother and father; Stroke in his father.   ROS:  Please see the history of present illness. Otherwise, complete review of systems is positive for none.  All other systems are reviewed and negative.   Physical Exam: VS:  BP (!) 188/80   Pulse 84   Ht 5' 11"  (1.803 m)   Wt (!) 395 lb 12.8 oz (179.5 kg)   SpO2 93%   BMI 55.20 kg/m , BMI Body mass index is 55.2 kg/m.  Wt Readings from Last 3  Encounters:  09/26/21 (!) 395 lb 12.8 oz (179.5 kg)  09/16/21 (!) 393 lb (178.3 kg)  08/17/21 (!) 386 lb 9.6 oz (175.4 kg)    General: Morbidly obese appears comfortable at rest. Neck: Supple, no elevated JVP or carotid bruits, no thyromegaly. Lungs: Clear to auscultation, nonlabored breathing at rest. Cardiac: Regular rate and rhythm, no S3 or significant systolic murmur, no pericardial rub. Extremities: No pitting edema, distal pulses 2+. Skin: Warm and dry. Musculoskeletal: No kyphosis. Neuropsychiatric: Alert and oriented x3, affect grossly appropriate.  ECG:    Recent Labwork: 01/19/2021: ALT 18; AST 18 05/26/2021: Hemoglobin 15.1; Platelets 262 08/17/2021: BUN 10; Creatinine, Ser 1.06; Potassium 3.9; Sodium 139     Component Value Date/Time  CHOL 147 09/16/2021 0935   TRIG 97 09/16/2021 0935   HDL 34 (L) 09/16/2021 0935   CHOLHDL 4.3 09/16/2021 0935   LDLCALC 95 09/16/2021 0935    Other Studies Reviewed Today:  Echocardiogram 09/06/2021  1. Left ventricular ejection fraction, by estimation, is 60 to 65%. The  left ventricle has normal function. Left ventricular endocardial border  not optimally defined to evaluate regional wall motion. There is moderate  concentric left ventricular  hypertrophy. Left ventricular diastolic parameters were normal.   2. Right ventricular systolic function is normal. The right ventricular  size is normal. Tricuspid regurgitation signal is inadequate for assessing  PA pressure.   3. The mitral valve is grossly normal. Trivial mitral valve  regurgitation.   4. The aortic valve was not well visualized. Aortic valve regurgitation  is not visualized.   5. The inferior vena cava is dilated in size with >50% respiratory  variability, suggesting right atrial pressure of 8 mmHg.   Comparison(s): No prior Echocardiogram.    CT angio chest 08/17/2021 IMPRESSION: Suboptimal evaluation, within this constraint:   1. No segmental or larger pulmonary  embolus. 2. Moderate-to-severe coronary artery atherosclerosis, greater in the proximal LAD.   Chest x-ray 06/22/2021: FINDINGS: Midline trachea. Borderline cardiomegaly. No pleural effusion or pneumothorax. Apparent pulmonary interstitial thickening on the frontal radiograph is favored to be artifactual in the setting of patient morbid obesity. No overt congestive failure or lobar consolidation.   Thoracic spondylosis.   IMPRESSION: Borderline cardiomegaly, without acute disease.   Assessment and Plan:  1. DOE (dyspnea on exertion)   2. Essential hypertension   3. Chronic acquired lymphedema    1. DOE (dyspnea on exertion) Continues with DOE.  He has an upcoming visit with Dr. Melvyn Novas pulmonology.  Recent CT ruled out for PE.  He had moderate to severe coronary atherosclerosis, greater in proximal LAD.  We discussed DOE could be an anginal equivalent.  He does have a strong family history in his father and his brother with early heart disease.  We discussed Lexiscan stress versus coronary CT.  Patient elects for coronary CT.  Please schedule for coronary CT.    2. Essential hypertension Blood pressure remains elevated at 188/80.  Continue amlodipine 10 mg daily.  Continue lisinopril 40 mg daily.  Continue metoprolol 100 mg p.o. twice daily.  Start hydralazine 50 mg p.o. twice daily.  Goal blood pressure is 130/80 or less.   Medication Adjustments/Labs and Tests Ordered: Current medicines are reviewed at length with the patient today.  Concerns regarding medicines are outlined above.   Disposition: Follow-up with Dr. Domenic Polite or APP after coronary CT  Signed, Levell July, NP 09/26/2021 3:13 PM    [facility] [place], [place], [place] [address] Phone: [phone]; Fax: [phone]

## 2021-09-26 ENCOUNTER — Ambulatory Visit (INDEPENDENT_AMBULATORY_CARE_PROVIDER_SITE_OTHER): Payer: BLUE CROSS/BLUE SHIELD | Admitting: Family Medicine

## 2021-09-26 ENCOUNTER — Encounter: Payer: Self-pay | Admitting: Family Medicine

## 2021-09-26 ENCOUNTER — Other Ambulatory Visit: Payer: Self-pay

## 2021-09-26 VITALS — BP 188/80 | HR 84 | Ht 71.0 in | Wt 395.8 lb

## 2021-09-26 DIAGNOSIS — I890 Lymphedema, not elsewhere classified: Secondary | ICD-10-CM | POA: Diagnosis not present

## 2021-09-26 DIAGNOSIS — I10 Essential (primary) hypertension: Secondary | ICD-10-CM | POA: Diagnosis not present

## 2021-09-26 DIAGNOSIS — I2510 Atherosclerotic heart disease of native coronary artery without angina pectoris: Secondary | ICD-10-CM

## 2021-09-26 DIAGNOSIS — R0609 Other forms of dyspnea: Secondary | ICD-10-CM

## 2021-09-26 MED ORDER — HYDRALAZINE HCL 50 MG PO TABS: 50.0000 mg | ORAL_TABLET | Freq: Two times a day (BID) | ORAL | 1 refills | Status: DC

## 2021-09-26 NOTE — Patient Instructions (Addendum)
Medication Instructions:  Your physician has recommended you make the following change in your medication:  Start hydralazine 50 mg twice daily Continue other medications the same  Labwork: none  Testing/Procedures: Coronary CTA  Follow-Up: Your physician recommends that you schedule a follow-up appointment in: 1-2 months  Any Other Special Instructions Will Be Listed Below (If Applicable).  If you need a refill on your cardiac medications before your next appointment, please call your pharmacy.    Your cardiac CT will be scheduled at one of the below locations:   [facility] [address] [place], [place] [address] [phone]  If scheduled at [facility], please arrive at the [facility] main entrance (entrance A) of [facility] 30 minutes prior to test start time. You can use the FREE valet parking offered at the main entrance (encouraged to control the heart rate for the test) Proceed to the [facility] Radiology Department (first floor) to check-in and test prep.  Please follow these instructions carefully (unless otherwise directed):  Hold all erectile dysfunction medications at least 3 days (72 hrs) prior to test (sildenafil or revatio)  On the Night Before the Test: Be sure to Drink plenty of water. Do not consume any caffeinated/decaffeinated beverages or chocolate 12 hours prior to your test. Do not take any antihistamines 12 hours prior to your test.  On the Day of the Test: Drink plenty of water until 1 hour prior to the test. Do not eat any food 4 hours prior to the test. You may take your regular medications prior to the test.  Take and extra metoprolol (Lopressor) 100 mg two hours prior to test. HOLD Furosemide/Hydrochlorothiazide morning of the test. HOLD Metformin and Jardiance on the morning of the test.       After the Test: Drink plenty of water. After receiving IV contrast, you may experience a mild flushed  feeling. This is normal. On occasion, you may experience a mild rash up to 24 hours after the test. This is not dangerous. If this occurs, you can take Benadryl 25 mg and increase your fluid intake. If you experience trouble breathing, this can be serious. If it is severe call 911 IMMEDIATELY. If it is mild, please call our office. If you take any of these medications: Metformin, please do not take 48 hours after completing test unless otherwise instructed.  Please allow 2-4 weeks for scheduling of routine cardiac CTs. Some insurance companies require a pre-authorization which may delay scheduling of this test.   For non-scheduling related questions, please contact the cardiac imaging nurse navigator should you have any questions/concerns: Rockwell Alexandria, Cardiac Imaging Nurse Navigator Larey Brick, Cardiac Imaging Nurse Navigator [facility] Direct Office Dial: [phone]   For scheduling needs, including cancellations and rescheduling, please call [place], [phone].

## 2021-09-27 ENCOUNTER — Telehealth: Payer: Self-pay | Admitting: *Deleted

## 2021-09-27 NOTE — Telephone Encounter (Signed)
-----   Message from Netta Neat., NP sent at 09/26/2021  7:29 PM EDT ----- Regarding: Re : CTA vs Cath vs Lexiscan OK .Will CC Jason Meyer and have her call him and offer Cath or 2 day Lexiscan if he is willing. Thanks.  ----- Message ----- From: Jonelle Sidle, MD Sent: 09/26/2021   7:15 PM EDT To: Netta Neat., NP  Thank you Mardelle Matte.  I reviewed your note.  I think given his body habitus and degree of coronary calcification, a coronary CTA may not be the best choice.  Honestly, I think that I would either consider doing a diagnostic cardiac catheterization in his case versus, if he preferred noninvasive imaging, a 2-day Lexiscan Myoview. ----- Message ----- From: Netta Neat., NP Sent: 09/26/2021   3:27 PM EDT To: Jonelle Sidle, MD

## 2021-09-27 NOTE — Telephone Encounter (Signed)
Patient informed and verbalized understanding of plan. Agreed to Heart Cath Will call back to give best day and time for next week Will arrange nurse visit for EKG once receive return call

## 2021-09-29 NOTE — Telephone Encounter (Signed)
Scheduled to see Andy,10/06/21 @11 :00 am Patient aware

## 2021-10-05 NOTE — Progress Notes (Signed)
Cardiology Office Note  Date: 10/06/2021   ID: Jason Meyer, DOB [birth year], MRN [id number]  PCP:  Dettinger, Fransisca Kaufmann, MD  Cardiologist:  Rozann Lesches, MD Electrophysiologist:  None   Chief Complaint: 1 month follow-up after CT  History of Present Illness: Jason Meyer is a 60 y.o. male with a history of HTN, DM 2, HLD, morbid obesity.  He was last seen by Dr. Domenic Polite on 08/17/2022 with complaint of shortness of breath.  He had ambulatory hypoxemia with oxygen saturation dropping to 86% on room air when he was seen in July.  He was sent for a chest x-ray which reported borderline cardiomegaly with no other acute process.  There was no evidence of previous formal pulmonary evaluation for further investigation of his hypoxemia.  He had been describing shortness of breath with activity over the prior few months.  He had chronic leg swelling that was consistent with lymphedema on examination.  He had been previously referred for sleep evaluation in the past but did not go through with testing.  Plan was to obtain an echocardiogram to assess cardiac structure and function with chest CT for more detailed lung examination and exclusion of pulmonary embolus.  Pulmonary referral was made to pulmonology for further investigation.  He was high risk for OSA and OHS.  BP control was not optimal on that visit.  BP complicated by morbid obesity and recurrent hypoxia.  Current medications including Lopressor, Norvasc, lisinopril, Lasix and potassium.  He was referred to PT for chronic appearing lymphedema recurrent through the ration of mechanical compression.  He was last here for follow-up on recent echocardiogram, CTA chest to rule out PE.  He continued to complain of shortness of breath.  Stated this all started around June and continues to recur states.  He was having significant activity intolerance and shortness of breath when performing any type of exertional activity.  Echocardiogram  09/06/2021 EF 60 to 65%.  Left ventricular endocardial border not optimally defined to evaluate regional wall motion.  There was moderate concentric LVH.  Diastolic parameters were normal.  Trivial MR. Recent CT to rule out PE notedModerate to severe coronary artery atherosclerosis greater than proximal LAD.  He recently had a brother who passed away from MI at age 52.  He states his father had 8 MIs and 7 strokes starting at an early age.  We discussed getting a coronary CT and possible FFR or Lexiscan stress test.  He had an upcoming visit with Dr. Melvyn Novas pulmonology in November.  His blood pressure is elevated today 188/80.  He stated his systolic blood pressures had been running 150s to 160s at home.  His current cardiac regimen included; amlodipine 10 mg daily, lisinopril 40 mg daily, metoprolol 100 mg p.o. twice daily, atorvastatin 80 mg daily, furosemide 20 mg as needed, potassium chloride 10 M EQ daily.  He is here for discussion of cardiac catheterization.  We discussed risks and benefits of cardiac catheterization thoroughly and he verbalizes understanding.  He states he is scared to have the cardiac catheterization at this moment and would like to speak to his Bishop before proceeding.  He states he has an upcoming visit with the pulmonologist and would like to see him possibly prior to deciding to see if anything can be done to improve his breathing.  He currently denies any anginal symptoms.  He continues to complain of significant DOE.  No DOE or SOB at rest.  He states his blood pressure is  up today because he is scared.  At previous conversation on 09/27/2021 with nursing staff Georgina Peer he agreed to have cardiac catheterization.  At the moment he is reticent to proceed until after he talks with his Bishop regarding the process.  EKG today shows a sinus rhythm first-degree AV block rate of 76.     Past Medical History:  Diagnosis Date   Allergy    Hyperlipidemia    Hypertension    Type  2 diabetes mellitus ([place])     Past Surgical History:  Procedure Laterality Date   NO PAST SURGERIES      Current Outpatient Medications  Medication Sig Dispense Refill   Accu-Chek Softclix Lancets lancets Test BID Dx E13.9 200 each 3   albuterol (PROVENTIL) (2.5 MG/3ML) 0.083% nebulizer solution Take 3 mLs by nebulization every 4 hours as needed for wheezing or shortness of breath. 90 mL 1   amLODipine (NORVASC) 10 MG tablet Take 1 tablet (10 mg total) by mouth daily. 90 tablet 3   atorvastatin (LIPITOR) 80 MG tablet Take 1 tablet (80 mg total) by mouth daily. 90 tablet 3   blood glucose meter kit and supplies KIT Test BID Dx E13.9 1 each 0   diclofenac (VOLTAREN) 50 MG EC tablet TAKE 1 TABLET BY MOUTH TWICE A DAY 60 tablet 1   Dulaglutide (TRULICITY) 1.5 NG/2.9BM SOPN Inject 1.5 mg into the skin once a week. 6 mL 3   empagliflozin (JARDIANCE) 10 MG TABS tablet Take 1 tablet (10 mg total) by mouth daily. 90 tablet 3   fluticasone (FLONASE) 50 MCG/ACT nasal spray Place 2 sprays into both nostrils daily. 16 g 6   furosemide (LASIX) 20 MG tablet Take 1 tablet (20 mg total) by mouth daily as needed for fluid or edema. 6 tablet 3   glucose blood (ACCU-CHEK AVIVA PLUS) test strip Test BID Dx E13.9 200 each 3   hydrALAZINE (APRESOLINE) 50 MG tablet Take 1 tablet (50 mg total) by mouth 2 (two) times daily. 180 tablet 1   lisinopril (ZESTRIL) 40 MG tablet Take 1 tablet (40 mg total) by mouth daily. 90 tablet 3   metFORMIN (GLUCOPHAGE) 1000 MG tablet Take 1 tablet (1,000 mg total) by mouth 2 (two) times daily with a meal. 180 tablet 3   metoprolol tartrate (LOPRESSOR) 100 MG tablet Take 1 tablet (100 mg total) by mouth 2 (two) times daily. 180 tablet 3   potassium chloride (MICRO-K) 10 MEQ CR capsule Take 1 capsule (10 mEq total) by mouth daily as needed. 7 capsule 3   sildenafil (REVATIO) 20 MG tablet 2-5 pills once daily as needed for erectile dysfunction 50 tablet 3   tamsulosin (FLOMAX) 0.4 MG  CAPS capsule Take 0.4 mg by mouth daily.     No current facility-administered medications for this visit.   Allergies:  Penicillins   Social History: The patient  reports that he quit smoking about 13 years ago. His smoking use included cigarettes. He has a 33.00 pack-year smoking history. He has never used smokeless tobacco. He reports that he does not drink alcohol and does not use drugs.   Family History: The patient's family history includes Alzheimer's disease in his mother; Heart attack in his brother and father; Stroke in his father.   ROS:  Please see the history of present illness. Otherwise, complete review of systems is positive for none.  All other systems are reviewed and negative.   Physical Exam: VS:  BP (!) 184/98   Pulse  74   Ht _0  (1.803 m)   Wt (!) 390 lb 6.4 oz (177.1 kg)   SpO2 93%   BMI 54.45 kg/m , BMI Body mass index is 54.45 kg/m.  Wt Readings from Last 3 Encounters:  10/06/21 (!) 390 lb 6.4 oz (177.1 kg)  09/26/21 (!) 395 lb 12.8 oz (179.5 kg)  09/16/21 (!) 393 lb (178.3 kg)    General: Morbidly obese appears comfortable at rest. Neck: Supple, no elevated JVP or carotid bruits, no thyromegaly. Lungs: Clear to auscultation, nonlabored breathing at rest. Cardiac: Regular rate and rhythm, no S3 or significant systolic murmur, no pericardial rub. Extremities: No pitting edema, distal pulses 2+. Skin: Warm and dry. Musculoskeletal: No kyphosis. Neuropsychiatric: Alert and oriented x3, affect grossly appropriate.  ECG: 10/06/2021 sinus rhythm first-degree AV block rate of 76.  Recent Labwork: 01/19/2021: ALT 18; AST 18 05/26/2021: Hemoglobin 15.1; Platelets 262 08/17/2021: BUN 10; Creatinine, Ser 1.06; Potassium 3.9; Sodium 139     Component Value Date/Time   CHOL 147 09/16/2021 0935   TRIG 97 09/16/2021 0935   HDL 34 (L) 09/16/2021 0935   CHOLHDL 4.3 09/16/2021 0935   LDLCALC 95 09/16/2021 0935    Other Studies Reviewed Today:  Echocardiogram  09/06/2021  1. Left ventricular ejection fraction, by estimation, is 60 to 65%. The  left ventricle has normal function. Left ventricular endocardial border  not optimally defined to evaluate regional wall motion. There is moderate  concentric left ventricular  hypertrophy. Left ventricular diastolic parameters were normal.   2. Right ventricular systolic function is normal. The right ventricular  size is normal. Tricuspid regurgitation signal is inadequate for assessing  PA pressure.   3. The mitral valve is grossly normal. Trivial mitral valve  regurgitation.   4. The aortic valve was not well visualized. Aortic valve regurgitation  is not visualized.   5. The inferior vena cava is dilated in size with >50% respiratory  variability, suggesting right atrial pressure of 8 mmHg.   Comparison(s): No prior Echocardiogram.    CT angio chest 08/17/2021 IMPRESSION: Suboptimal evaluation, within this constraint:   1. No segmental or larger pulmonary embolus. 2. Moderate-to-severe coronary artery atherosclerosis, greater in the proximal LAD.   Chest x-ray 06/22/2021: FINDINGS: Midline trachea. Borderline cardiomegaly. No pleural effusion or pneumothorax. Apparent pulmonary interstitial thickening on the frontal radiograph is favored to be artifactual in the setting of patient morbid obesity. No overt congestive failure or lobar consolidation.   Thoracic spondylosis.   IMPRESSION: Borderline cardiomegaly, without acute disease.   Assessment and Plan:  1. DOE (dyspnea on exertion)   2. Essential hypertension     1. DOE (dyspnea on exertion) Continues with DOE.  He has an upcoming visit with Dr. Melvyn Novas pulmonology.  Recent CT ruled out for PE.  He had moderate to severe coronary atherosclerosis, greater in proximal LAD.  We discussed DOE could be an anginal equivalent.  He does have a strong family history in his father and his brother with early heart disease.  Is here today for  discussion of cardiac catheterization.  During recent phone conversation on 09/27/2021 with Georgina Peer, RN he agreed to cardiac catheterization.  We discussed thoroughly the risk and benefits of cardiac catheterization today during his visit.  At the moment he is reticent to proceed and would like to speak to his Bishop before proceeding.  He fully understands the risk and benefits of cardiac catheterization.  He states once he talks to his Bishop he will let  us know what he would like to proceed.  2. Essential hypertension Blood pressure remains elevated at BP is elevated today at 184/98.  He states the reason his blood pressure is elevated because he is scared.  Continue amlodipine 10 mg daily.  Continue lisinopril 40 mg daily.  Continue metoprolol 100 mg p.o. twice daily.  Start hydralazine 50 mg p.o. twice daily.  Goal blood pressure is 130/80 or less.   Medication Adjustments/Labs and Tests Ordered: Current medicines are reviewed at length with the patient today.  Concerns regarding medicines are outlined above.   Disposition: Follow-up with Dr. Domenic Polite or APP 3 months  Signed, Levell July, NP 10/06/2021 11:31 AM    [facility] [place], [place], [place] [address] Phone: [phone]; Fax: [phone]

## 2021-10-06 ENCOUNTER — Encounter: Payer: Self-pay | Admitting: Family Medicine

## 2021-10-06 ENCOUNTER — Other Ambulatory Visit: Payer: Self-pay

## 2021-10-06 ENCOUNTER — Ambulatory Visit (INDEPENDENT_AMBULATORY_CARE_PROVIDER_SITE_OTHER): Payer: BLUE CROSS/BLUE SHIELD | Admitting: Family Medicine

## 2021-10-06 VITALS — BP 184/98 | HR 74 | Ht 71.0 in | Wt 390.4 lb

## 2021-10-06 DIAGNOSIS — R0609 Other forms of dyspnea: Secondary | ICD-10-CM | POA: Diagnosis not present

## 2021-10-06 DIAGNOSIS — I10 Essential (primary) hypertension: Secondary | ICD-10-CM | POA: Diagnosis not present

## 2021-10-06 NOTE — Patient Instructions (Addendum)

## 2021-10-13 ENCOUNTER — Encounter: Payer: Self-pay | Admitting: Internal Medicine

## 2021-10-13 ENCOUNTER — Ambulatory Visit (INDEPENDENT_AMBULATORY_CARE_PROVIDER_SITE_OTHER): Payer: BLUE CROSS/BLUE SHIELD | Admitting: Internal Medicine

## 2021-10-13 ENCOUNTER — Other Ambulatory Visit: Payer: Self-pay

## 2021-10-13 DIAGNOSIS — E1159 Type 2 diabetes mellitus with other circulatory complications: Secondary | ICD-10-CM | POA: Diagnosis not present

## 2021-10-13 DIAGNOSIS — R0609 Other forms of dyspnea: Secondary | ICD-10-CM | POA: Diagnosis not present

## 2021-10-13 DIAGNOSIS — I152 Hypertension secondary to endocrine disorders: Secondary | ICD-10-CM

## 2021-10-13 MED ORDER — OLMESARTAN MEDOXOMIL 40 MG PO TABS: 40.0000 mg | ORAL_TABLET | Freq: Every day | ORAL | 11 refills | Status: DC

## 2021-10-13 MED ORDER — BISOPROLOL FUMARATE 10 MG PO TABS: 10.0000 mg | ORAL_TABLET | Freq: Every day | ORAL | 11 refills | Status: DC

## 2021-10-13 NOTE — Assessment & Plan Note (Signed)
D/c acei and lopressor 10/13/2021 due to concerns adverse drug effects  ACE inhibitors are problematic in  pts with airway complaints because  even experienced pulmonologists can't always distinguish ace effects from copd/asthma.  By themselves they don't actually cause a problem, much like oxygen can't by itself start a fire, but they certainly serve as a powerful catalyst or enhancer for any "fire"  or inflammatory process in the upper airway, be it caused by an ET  tube or more commonly reflux (especially in the obese or pts with known GERD or who are on biphoshonates).    In the era of ARB near equivalency until we have a better handle on the reversibility of the airway problem, it just makes sense to avoid ACEI  entirely in the short run and then decide later, having established a level of airway control using a reasonable limited regimen, whether to add back ace but even then being very careful to observe the pt for worsening airway control and number of meds used/ needed to control symptoms.    In the setting of respiratory symptoms of unknown etiology,  It would be preferable to use bystolic, the most beta -1  selective Beta blocker available in sample form, with bisoprolol the most selective generic choice  on the market, at least on a trial basis, to make sure the spillover Beta 2 effects of the less specific Beta blockers are not contributing to this patient's symptoms.   >>> try bisoprolol 10 mg bid and olmeasartan 40 mg dialy

## 2021-10-13 NOTE — Assessment & Plan Note (Signed)
Body mass index is 55.1 kg/m.    Lab Results  Component Value Date   TSH 1.750 07/06/2014      Contributing to doe and risk of GERD >>>   reviewed the need and the process to achieve and maintain neg calorie balance > defer f/u primary care including intermittently monitoring thyroid status     Each maintenance medication was reviewed in detail including emphasizing most importantly the difference between maintenance and prns and under what circumstances the prns are to be triggered using an action plan format where appropriate.  Total time for H and P, chart review, counseling,  directly observing portions of ambulatory 02 saturation study/ and generating customized AVS unique to this office visit / same day charting  > 45 min

## 2021-10-13 NOTE — Assessment & Plan Note (Addendum)
Onset: June 2022 Quit smoking 2009 with good residual ext tol @ wt 300 2009  Echocardiogram 09/06/2021 1. Left ventricular ejection fraction, by estimation, is 60 to 65%. The  left ventricle has normal function. Left ventricular endocardial border  not optimally defined to evaluate regional wall motion. There is moderate  concentric left ventricular hypertrophy. Left ventricular diastolic parameters were normal.  2. Right ventricular systolic function is normal. The right ventricular  size is normal. Tricuspid regurgitation signal is inadequate for assessing PA pressure.  3 The inferior vena cava is dilated in size with >50% respiratory  variability, suggesting right atrial pressure of 8 mmHg.  - CTa 08/17/21 nl  - 10/13/2021 patient walked at a slow pace on room air. Reported SOB on first lap that increased on second lap. Stopped after second lap due to SOB and knee pain lowest sat 94%  - trial off acei / high dose lopressor 10/13/2021   Symptoms are markedly disproportionate to objective findings and not clear to what extent this is actually a pulmonary  problem but pt does appear to have difficult to sort out respiratory symptoms of unknown origin for which  DDX  = almost all start with A and  include Adherence, Ace Inhibitors, Acid Reflux, Active Sinus Disease, Alpha 1 Antitripsin deficiency, Anxiety masquerading as Airways dz,  ABPA,  Allergy(esp in young), Aspiration (esp in elderly), Adverse effects of meds,  Active smoking or Vaping, A bunch of PE's/clot burden (a few small clots can't cause this syndrome unless there is already severe underlying pulm or vascular dz with poor reserve),  Anemia or thyroid disorder, plus two Bs  = Bronchiectasis and Beta blocker use..and one C= CHF     Adherence is always the initial "prime suspect" and is a multilayered concern that requires a "trust but verify" approach in every patient - starting with knowing how to use medications, especially inhalers,  correctly, keeping up with refills and understanding the fundamental difference between maintenance and prns vs those medications only taken for a very short course and then stopped and not refilled.  - note he is not consistent with meds/ timing   ACE inhibitors are problematic in  pts with airway complaints because  even experienced pulmonologists can't always distinguish ace effects from copd/asthma.  By themselves they don't actually cause a problem, much like oxygen can't by itself start a fire, but they certainly serve as a powerful catalyst or enhancer for any "fire"  or inflammatory process in the upper airway, be it caused by an ET  tube or more commonly reflux (especially in the obese or pts with known GERD or who are on biphoshonates).    ACEi adverse effects at the  top of the usual list of suspects and the only way to rule it out is a trial off > see a/p    ? Asthma/ allergy > check allergy screen rx saba Re SABA :  I spent extra time with pt today reviewing appropriate use of albuterol for prn use on exertion with the following points: 1) saba is for relief of sob that does not improve by walking a slower pace or resting but rather if the pt does not improve after trying this first. 2) If the pt is convinced, as many are, that saba helps recover from activity faster then it's easy to tell if this is the case by re-challenging : ie stop, take the inhaler, then p 5 minutes try the exact same activity (intensity of workload)  that just caused the symptoms and see if they are substantially diminished or not after saba 3) if there is an activity that reproducibly causes the symptoms, try the saba 15 min before the activity on alternate days   If in fact the saba really does help, then fine to continue to use it prn but advised may need to look closer at the maintenance regimen being used to achieve better control of airways disease with exertion.      ? Anxiety/deconditoining/ wt gain  >  usually at the bottom of this list of usual suspects but may interfere with adherence and also interpretation of response or lack thereof to symptom management which can be quite subjective.   ? BB effects   - on high dose lopressor with possible asthma > try off see a/p  ? Chf/ ihd > if not improving on recs would proceed with cath.

## 2021-10-13 NOTE — Patient Instructions (Addendum)
Stop lisinopril and start olmesartan 40 mg one daily and cough and sinus problems should improve  within a few weeks  Stop lopressor(metaprolol)  and take bisoprolol 10 mg twice daily instead  Call me with any problems with your blood pressure  Ok to try albuterol nebulizer (think of it like starter fluid but it lasts 4 hours) 15 min before an activity (on alternating days)  that you know would usually make you short of breath and see if it makes any difference and if makes none then don't take albuterol after activity unless you can't catch your breath as this means it's the resting that helps, not the albuterol.      Please remember to go to the lab department   for your tests - we will call you with the results when they are available.      If not improving over the next several weeks after you make the change in your medications I would encourage you to reconsider the heart catherization.   Pulmonary follow up is as needed

## 2021-10-13 NOTE — Progress Notes (Signed)
[place], male    DOB: Dec 06, 1961,   MRN: [id number]   Brief patient profile:  59 yobm quit smoking 2009 @ 300 lb and no doe  referred to pulmonary clinic 10/13/2021 by Cardiology for doe since June 2022 when rx for UTI     History of Present Illness  10/13/2021  Pulmonary/ 1st office eval/[facility] on ACEi  Chief Complaint  Patient presents with   Consult    SOB with exertion beginning in June 2022. Ref by cardiology.   Dyspnea:  mb and back every time walking slower than others / last shopped years ago due to knees  Cough: dry  "triggered by breathing with mask " assoc with sensation of nasal congestion / pnds  Sleep: bed is flat/ 2-3 pillow x years SABA use: has neb up to tid does not seem to help   No obvious day to day or daytime variability or assoc excess/ purulent sputum or mucus plugs or hemoptysis or cp or chest tightness, subjective wheeze or overt sinus or hb symptoms.   Sleeping  without nocturnal  or early am exacerbation  of respiratory  c/o's or need for noct saba. Also denies any obvious fluctuation of symptoms with weather or environmental changes or other aggravating or alleviating factors except as outlined above   No unusual exposure hx or h/o childhood pna/ asthma or knowledge of premature birth.  Current Allergies, Complete Past Medical History, Past Surgical History, Family History, and Social History were reviewed in [employer] record.  ROS  The following are not active complaints unless bolded Hoarseness, sore throat, dysphagia, dental problems, itching, sneezing,  nasal congestion or discharge of excess mucus or purulent secretions, ear ache,   fever, chills, sweats, unintended wt loss or wt gain, classically pleuritic or exertional cp,  orthopnea pnd or arm/hand swelling  or leg swelling, presyncope, palpitations, abdominal pain, anorexia, nausea, vomiting, diarrhea  or change in bowel habits or change in bladder habits, change in  stools or change in urine, dysuria, hematuria,  rash, arthralgias, visual complaints, headache, numbness, weakness or ataxia or problems with walking or coordination,  change in mood or  memory.           Past Medical History:  Diagnosis Date   Allergy    Hyperlipidemia    Hypertension    Type 2 diabetes mellitus ([place])     Outpatient Medications Prior to Visit  Medication Sig Dispense Refill   Accu-Chek Softclix Lancets lancets Test BID Dx E13.9 200 each 3   albuterol (PROVENTIL) (2.5 MG/3ML) 0.083% nebulizer solution Take 3 mLs by nebulization every 4 hours as needed for wheezing or shortness of breath. 90 mL 1   amLODipine (NORVASC) 10 MG tablet Take 1 tablet (10 mg total) by mouth daily. 90 tablet 3   atorvastatin (LIPITOR) 80 MG tablet Take 1 tablet (80 mg total) by mouth daily. 90 tablet 3   blood glucose meter kit and supplies KIT Test BID Dx E13.9 1 each 0   diclofenac (VOLTAREN) 50 MG EC tablet TAKE 1 TABLET BY MOUTH TWICE A DAY 60 tablet 1   Dulaglutide (TRULICITY) 1.5 MC/9.4BS SOPN Inject 1.5 mg into the skin once a week. 6 mL 3   empagliflozin (JARDIANCE) 10 MG TABS tablet Take 1 tablet (10 mg total) by mouth daily. 90 tablet 3   fluticasone (FLONASE) 50 MCG/ACT nasal spray Place 2 sprays into both nostrils daily. 16 g 6   furosemide (LASIX) 20 MG tablet  Take 1 tablet (20 mg total) by mouth daily as needed for fluid or edema. 6 tablet 3   glucose blood (ACCU-CHEK AVIVA PLUS) test strip Test BID Dx E13.9 200 each 3   hydrALAZINE (APRESOLINE) 50 MG tablet Take 1 tablet (50 mg total) by mouth 2 (two) times daily. 180 tablet 1   lisinopril (ZESTRIL) 40 MG tablet Take 1 tablet (40 mg total) by mouth daily. 90 tablet 3   metFORMIN (GLUCOPHAGE) 1000 MG tablet Take 1 tablet (1,000 mg total) by mouth 2 (two) times daily with a meal. 180 tablet 3   metoprolol tartrate (LOPRESSOR) 100 MG tablet Take 1 tablet (100 mg total) by mouth 2 (two) times daily. 180 tablet 3   potassium chloride  (MICRO-K) 10 MEQ CR capsule Take 1 capsule (10 mEq total) by mouth daily as needed. 7 capsule 3   sildenafil (REVATIO) 20 MG tablet 2-5 pills once daily as needed for erectile dysfunction 50 tablet 3   tamsulosin (FLOMAX) 0.4 MG CAPS capsule Take 0.4 mg by mouth daily.     No facility-administered medications prior to visit.     Objective:     BP (!) 150/98 (BP Location: Left Arm, Patient Position: Sitting)   Pulse 78   Temp 98.8 F (37.1 C) (Temporal)   Ht 5' 11" (1.803 m)   Wt (!) 395 lb 1.3 oz (179.2 kg)   SpO2 94% Comment: ra  BMI 55.10 kg/m   SpO2: 94 % (ra)  Amb obese bm nad / freq throat clearing    HEENT : pt wearing mask not removed for exam due to covid -19 concerns.    NECK :  without JVD/Nodes/TM/ nl carotid upstrokes bilaterally   LUNGS: no acc muscle use,  Nl contour chest which is clear to A and P bilaterally without cough on insp or exp maneuvers   CV:  RRR  no s3 or murmur or increase in P2, and pitting edema both LE's   ABD:  massively obese by soft and nontender with nl inspiratory excursion in the supine position. No bruits or organomegaly appreciated, bowel sounds nl  MS:  Nl gait/ ext warm without deformities, calf tenderness, cyanosis or clubbing No obvious joint restrictions   SKIN: warm and dry without lesions    NEURO:  alert, approp, nl sensorium with  no motor or cerebellar deficits apparent.     I personally reviewed images and agree with radiology impression as follows:   Chest CTa 08/17/21  1. No segmental or larger pulmonary embolus. 2. Moderate-to-severe coronary artery atherosclerosis, greater in the proximal LAD.    Assessment   DOE (dyspnea on exertion) Onset: June 2022 Quit smoking 2009 with good residual ext tol @ wt 300 2009  Echocardiogram 09/06/2021  1. Left ventricular ejection fraction, by estimation, is 60 to 65%. The  left ventricle has normal function. Left ventricular endocardial border  not optimally defined to  evaluate regional wall motion. There is moderate  concentric left ventricular hypertrophy. Left ventricular diastolic parameters were normal.   2. Right ventricular systolic function is normal. The right ventricular  size is normal. Tricuspid regurgitation signal is inadequate for assessing PA pressure.  3 The inferior vena cava is dilated in size with >50% respiratory  variability, suggesting right atrial pressure of 8 mmHg.  - CTa 08/17/21 nl  - 10/13/2021 patient walked at a slow pace on room air. Reported SOB on first lap that increased on second lap. Stopped after second lap due to SOB  and knee pain lowest sat 94%  - trial off acei / high dose lopressor 10/13/2021   Symptoms are markedly disproportionate to objective findings and not clear to what extent this is actually a pulmonary  problem but pt does appear to have difficult to sort out respiratory symptoms of unknown origin for which  DDX  = almost all start with A and  include Adherence, Ace Inhibitors, Acid Reflux, Active Sinus Disease, Alpha 1 Antitripsin deficiency, Anxiety masquerading as Airways dz,  ABPA,  Allergy(esp in young), Aspiration (esp in elderly), Adverse effects of meds,  Active smoking or Vaping, A bunch of PE's/clot burden (a few small clots can't cause this syndrome unless there is already severe underlying pulm or vascular dz with poor reserve),  Anemia or thyroid disorder, plus two Bs  = Bronchiectasis and Beta blocker use..and one C= CHF     Adherence is always the initial "prime suspect" and is a multilayered concern that requires a "trust but verify" approach in every patient - starting with knowing how to use medications, especially inhalers, correctly, keeping up with refills and understanding the fundamental difference between maintenance and prns vs those medications only taken for a very short course and then stopped and not refilled.  - note he is not consistent with meds/ timing   ACE inhibitors are problematic  in  pts with airway complaints because  even experienced pulmonologists can't always distinguish ace effects from copd/asthma.  By themselves they don't actually cause a problem, much like oxygen can't by itself start a fire, but they certainly serve as a powerful catalyst or enhancer for any "fire"  or inflammatory process in the upper airway, be it caused by an ET  tube or more commonly reflux (especially in the obese or pts with known GERD or who are on biphoshonates).    ACEi adverse effects at the  top of the usual list of suspects and the only way to rule it out is a trial off > see a/p    ? Asthma/ allergy > check labs rx saba Re SABA :  I spent extra time with pt today reviewing appropriate use of albuterol for prn use on exertion with the following points: 1) saba is for relief of sob that does not improve by walking a slower pace or resting but rather if the pt does not improve after trying this first. 2) If the pt is convinced, as many are, that saba helps recover from activity faster then it's easy to tell if this is the case by re-challenging : ie stop, take the inhaler, then p 5 minutes try the exact same activity (intensity of workload) that just caused the symptoms and see if they are substantially diminished or not after saba 3) if there is an activity that reproducibly causes the symptoms, try the saba 15 min before the activity on alternate days   If in fact the saba really does help, then fine to continue to use it prn but advised may need to look closer at the maintenance regimen being used to achieve better control of airways disease with exertion.      ? Anxiety/deconditoining/ wt gain  > usually at the bottom of this list of usual suspects but may interfere with adherence and also interpretation of response or lack thereof to symptom management which can be quite subjective.   ? BB effects   - on high dose lopressor with possible asthma > try off see a/p  ? Chf/ ihd >  if  not improving on recs would proceed with cath.   Hypertension associated with diabetes ([place]) D/c acei and lopressor 10/13/2021 due to concerns adverse drug effects  ACE inhibitors are problematic in  pts with airway complaints because  even experienced pulmonologists can't always distinguish ace effects from copd/asthma.  By themselves they don't actually cause a problem, much like oxygen can't by itself start a fire, but they certainly serve as a powerful catalyst or enhancer for any "fire"  or inflammatory process in the upper airway, be it caused by an ET  tube or more commonly reflux (especially in the obese or pts with known GERD or who are on biphoshonates).    In the era of ARB near equivalency until we have a better handle on the reversibility of the airway problem, it just makes sense to avoid ACEI  entirely in the short run and then decide later, having established a level of airway control using a reasonable limited regimen, whether to add back ace but even then being very careful to observe the pt for worsening airway control and number of meds used/ needed to control symptoms.    In the setting of respiratory symptoms of unknown etiology,  It would be preferable to use bystolic, the most beta -1  selective Beta blocker available in sample form, with bisoprolol the most selective generic choice  on the market, at least on a trial basis, to make sure the spillover Beta 2 effects of the less specific Beta blockers are not contributing to this patient's symptoms.   >>> try bisoprolol 10 mg bid and olmeasartan 40 mg dialy      Morbid obesity ([place]) Body mass index is 55.1 kg/m.    Lab Results  Component Value Date   TSH 1.750 07/06/2014      Contributing to doe and risk of GERD >>>   reviewed the need and the process to achieve and maintain neg calorie balance > defer f/u primary care including intermittently monitoring thyroid status     Each maintenance medication was reviewed in  detail including emphasizing most importantly the difference between maintenance and prns and under what circumstances the prns are to be triggered using an action plan format where appropriate.  Total time for H and P, chart review, counseling,  directly observing portions of ambulatory 02 saturation study/ and generating customized AVS unique to this office visit / same day charting  > 45 min                    Christinia Gully, MD 10/13/2021

## 2021-10-18 ENCOUNTER — Encounter: Payer: Self-pay | Admitting: Internal Medicine

## 2021-10-21 LAB — CBC WITH DIFFERENTIAL/PLATELET
Basophils Absolute: 0.1 10*3/uL (ref 0.0–0.2)
Basos: 1 %
EOS (ABSOLUTE): 0.3 10*3/uL (ref 0.0–0.4)
Eos: 3 %
Hematocrit: 51.2 % — ABNORMAL HIGH (ref 37.5–51.0)
Hemoglobin: 15.9 g/dL (ref 13.0–17.7)
Immature Grans (Abs): 0 10*3/uL (ref 0.0–0.1)
Immature Granulocytes: 0 %
Lymphocytes Absolute: 1.9 10*3/uL (ref 0.7–3.1)
Lymphs: 19 %
MCH: 24.8 pg — ABNORMAL LOW (ref 26.6–33.0)
MCHC: 31.1 g/dL — ABNORMAL LOW (ref 31.5–35.7)
MCV: 80 fL (ref 79–97)
Monocytes Absolute: 0.6 10*3/uL (ref 0.1–0.9)
Monocytes: 6 %
Neutrophils Absolute: 7.3 10*3/uL — ABNORMAL HIGH (ref 1.4–7.0)
Neutrophils: 71 %
Platelets: 293 10*3/uL (ref 150–450)
RBC: 6.4 x10E6/uL — ABNORMAL HIGH (ref 4.14–5.80)
RDW: 15.9 % — ABNORMAL HIGH (ref 11.6–15.4)
WBC: 10.3 10*3/uL (ref 3.4–10.8)

## 2021-10-21 LAB — TSH: TSH: 1.65 u[IU]/mL (ref 0.450–4.500)

## 2021-10-21 LAB — IGE: IgE (Immunoglobulin E), Serum: 146 IU/mL (ref 6–495)

## 2021-10-26 NOTE — Progress Notes (Signed)
LMTCB

## 2021-11-01 ENCOUNTER — Telehealth: Payer: Self-pay | Admitting: Internal Medicine

## 2021-11-01 NOTE — Telephone Encounter (Signed)
Patient is aware of results and voiced his understanding.  Nothing further needed.  

## 2021-11-01 NOTE — Telephone Encounter (Signed)
Call patient :  Studies are ok but do suggest mild allergy but this is just a screen - if not doing some better in next few weeks with the recs already made then refer to [facility]/ allergy in      I have called and LM on VM for the pt to call back.

## 2021-11-02 ENCOUNTER — Telehealth: Payer: Self-pay | Admitting: Family Medicine

## 2021-11-02 MED ORDER — DICLOFENAC SODIUM 50 MG PO TBEC: 50.0000 mg | DELAYED_RELEASE_TABLET | Freq: Two times a day (BID) | ORAL | 1 refills | Status: DC

## 2021-11-02 NOTE — Telephone Encounter (Signed)
  Prescription Request  11/02/2021  Is this a "Controlled Substance" medicine? NO  Have you seen your PCP in the last 2 weeks? NO  If YES, route message to pool  -  If NO, patient needs to be scheduled for appointment.  What is the name of the medication or equipment? Diclofenac 50 MCG PATIENT IS OUT  Have you contacted your pharmacy to request a refill? YES   Which pharmacy would you like this sent to? [facility] in [place]   Patient notified that their request is being sent to the clinical staff for review and that they should receive a response within 2 business days.

## 2021-11-17 ENCOUNTER — Ambulatory Visit: Payer: PRIVATE HEALTH INSURANCE | Admitting: Family Medicine

## 2021-11-17 ENCOUNTER — Encounter: Payer: Self-pay | Admitting: Family Medicine

## 2021-11-17 ENCOUNTER — Other Ambulatory Visit: Payer: Self-pay

## 2021-11-17 VITALS — BP 164/78 | HR 75 | Temp 96.9°F | Resp 22 | Ht 71.0 in | Wt >= 6400 oz

## 2021-11-17 DIAGNOSIS — R0989 Other specified symptoms and signs involving the circulatory and respiratory systems: Secondary | ICD-10-CM | POA: Diagnosis not present

## 2021-11-17 MED ORDER — MONTELUKAST SODIUM 10 MG PO TABS: 10.0000 mg | ORAL_TABLET | Freq: Every day | ORAL | 3 refills | Status: DC

## 2021-11-17 MED ORDER — PREDNISONE 20 MG PO TABS: ORAL_TABLET | ORAL | 0 refills | Status: DC

## 2021-11-17 MED ORDER — AZELASTINE HCL 0.1 % NA SOLN: 1.0000 | Freq: Two times a day (BID) | NASAL | 12 refills | Status: DC

## 2021-11-17 NOTE — Progress Notes (Signed)
BP (!) 164/78   Pulse 75   Temp (!) 96.9 F (36.1 C)   Resp (!) 22   Ht 5' 11"  (1.803 m)   Wt (!) 400 lb (181.4 kg)   SpO2 93%   BMI 55.79 kg/m    Subjective:   Patient ID: Jason Meyer, male    DOB: 10-Jul-1961, 60 y.o.   MRN: [id number]  HPI: Jason Meyer is a 60 y.o. male presenting on 11/17/2021 for No chief complaint on file.   HPI Patient comes in today because he still having a lot of nasal congestion issues with stopped up but he feels like that is what is affected his breathing.  He has seen both cardiology and pulmonology and they said his heart and his lungs test out and they think it is more nasal.  They recommended him to see an ENT.  He says he is using the Flonase for it and just not helping.  He denies any fevers or chills or cough or congestion.  He says is been going on for 6 months like this.  Relevant past medical, surgical, family and social history reviewed and updated as indicated. Interim medical history since our last visit reviewed. Allergies and medications reviewed and updated.  Review of Systems  Constitutional:  Negative for chills and fever.  HENT:  Positive for congestion, rhinorrhea and sinus pressure. Negative for ear discharge, ear pain, postnasal drip, sneezing, sore throat and voice change.   Eyes:  Negative for pain, discharge, redness and visual disturbance.  Respiratory:  Negative for cough, shortness of breath and wheezing.   Cardiovascular:  Negative for chest pain and leg swelling.  Musculoskeletal:  Negative for gait problem.  Skin:  Negative for rash.  All other systems reviewed and are negative.  Per HPI unless specifically indicated above   Allergies as of 11/17/2021       Reactions   Penicillins         Medication List        Accurate as of November 17, 2021 11:46 AM. If you have any questions, ask your nurse or doctor.          Accu-Chek Softclix Lancets lancets Test BID Dx E13.9   albuterol (2.5  MG/3ML) 0.083% nebulizer solution Commonly known as: PROVENTIL Take 3 mLs by nebulization every 4 hours as needed for wheezing or shortness of breath.   amLODipine 10 MG tablet Commonly known as: NORVASC Take 1 tablet (10 mg total) by mouth daily.   atorvastatin 80 MG tablet Commonly known as: LIPITOR Take 1 tablet (80 mg total) by mouth daily.   azelastine 0.1 % nasal spray Commonly known as: ASTELIN Place 1 spray into both nostrils 2 (two) times daily. Use in each nostril as directed Started by: Fransisca Kaufmann Aaryanna Hyden, MD   bisoprolol 10 MG tablet Commonly known as: ZEBETA Take 1 tablet (10 mg total) by mouth daily.   blood glucose meter kit and supplies Kit Test BID Dx E13.9   diclofenac 50 MG EC tablet Commonly known as: VOLTAREN Take 1 tablet (50 mg total) by mouth 2 (two) times daily.   empagliflozin 10 MG Tabs tablet Commonly known as: Jardiance Take 1 tablet (10 mg total) by mouth daily.   fluticasone 50 MCG/ACT nasal spray Commonly known as: FLONASE Place 2 sprays into both nostrils daily.   furosemide 20 MG tablet Commonly known as: LASIX Take 1 tablet (20 mg total) by mouth daily as needed for fluid or edema.  glucose blood test strip Commonly known as: Accu-Chek Aviva Plus Test BID Dx E13.9   hydrALAZINE 50 MG tablet Commonly known as: APRESOLINE Take 1 tablet (50 mg total) by mouth 2 (two) times daily.   metFORMIN 1000 MG tablet Commonly known as: GLUCOPHAGE Take 1 tablet (1,000 mg total) by mouth 2 (two) times daily with a meal.   montelukast 10 MG tablet Commonly known as: SINGULAIR Take 1 tablet (10 mg total) by mouth at bedtime. Started by: Fransisca Kaufmann Aaryav Hopfensperger, MD   olmesartan 40 MG tablet Commonly known as: BENICAR Take 1 tablet (40 mg total) by mouth daily.   potassium chloride 10 MEQ CR capsule Commonly known as: MICRO-K Take 1 capsule (10 mEq total) by mouth daily as needed.   predniSONE 20 MG tablet Commonly known as: DELTASONE 2 po  at same time daily for 5 days Started by: Worthy Rancher, MD   sildenafil 20 MG tablet Commonly known as: Revatio 2-5 pills once daily as needed for erectile dysfunction   tamsulosin 0.4 MG Caps capsule Commonly known as: FLOMAX Take 0.4 mg by mouth daily.   Trulicity 1.5 XQ/1.1HE Sopn Generic drug: Dulaglutide Inject 1.5 mg into the skin once a week.         Objective:   BP (!) 164/78   Pulse 75   Temp (!) 96.9 F (36.1 C)   Resp (!) 22   Ht 5' 11"  (1.803 m)   Wt (!) 400 lb (181.4 kg)   SpO2 93%   BMI 55.79 kg/m   Wt Readings from Last 3 Encounters:  11/17/21 (!) 400 lb (181.4 kg)  10/13/21 (!) 395 lb 1.3 oz (179.2 kg)  10/06/21 (!) 390 lb 6.4 oz (177.1 kg)    Physical Exam Vitals and nursing note reviewed.  Constitutional:      General: He is not in acute distress.    Appearance: He is well-developed. He is not diaphoretic.  Eyes:     General: No scleral icterus.    Conjunctiva/sclera: Conjunctivae normal.  Neck:     Thyroid: No thyromegaly.  Cardiovascular:     Rate and Rhythm: Normal rate and regular rhythm.     Heart sounds: Normal heart sounds. No murmur heard. Pulmonary:     Effort: Pulmonary effort is normal. No respiratory distress.     Breath sounds: Normal breath sounds. No wheezing.  Musculoskeletal:        General: No swelling. Normal range of motion.     Cervical back: Neck supple.  Lymphadenopathy:     Cervical: No cervical adenopathy.  Skin:    General: Skin is warm and dry.     Findings: No rash.  Neurological:     Mental Status: He is alert and oriented to person, place, and time.     Coordination: Coordination normal.  Psychiatric:        Behavior: Behavior normal.      Assessment & Plan:   Problem List Items Addressed This Visit   None Visit Diagnoses     Chronic sinus complaints    -  Primary   Relevant Medications   azelastine (ASTELIN) 0.1 % nasal spray   predniSONE (DELTASONE) 20 MG tablet   montelukast  (SINGULAIR) 10 MG tablet   Other Relevant Orders   Ambulatory referral to ENT       We will try Astelin and a short course of prednisone and Singulair and see if those improve, if not we will go to ENT. Follow up  plan: Return if symptoms worsen or fail to improve.  Counseling provided for all of the vaccine components Orders Placed This Encounter  Procedures   Ambulatory referral to ENT     Caryl Pina, MD [facility] 11/17/2021, 11:46 AM

## 2021-11-18 ENCOUNTER — Emergency Department (HOSPITAL_COMMUNITY)
Admission: EM | Admit: 2021-11-18 | Discharge: 2021-11-18 | Disposition: A | Discharge status: Home / Self Care | Payer: 59 | Attending: Emergency Medicine | Admitting: Emergency Medicine

## 2021-11-18 ENCOUNTER — Emergency Department (HOSPITAL_COMMUNITY): Payer: 59

## 2021-11-18 ENCOUNTER — Encounter (HOSPITAL_COMMUNITY): Payer: Self-pay | Admitting: Emergency Medicine

## 2021-11-18 ENCOUNTER — Other Ambulatory Visit: Payer: Self-pay

## 2021-11-18 DIAGNOSIS — Z7984 Long term (current) use of oral hypoglycemic drugs: Secondary | ICD-10-CM | POA: Insufficient documentation

## 2021-11-18 DIAGNOSIS — Z794 Long term (current) use of insulin: Secondary | ICD-10-CM | POA: Diagnosis not present

## 2021-11-18 DIAGNOSIS — R0602 Shortness of breath: Secondary | ICD-10-CM | POA: Diagnosis present

## 2021-11-18 DIAGNOSIS — Z20822 Contact with and (suspected) exposure to covid-19: Secondary | ICD-10-CM | POA: Insufficient documentation

## 2021-11-18 DIAGNOSIS — Z87891 Personal history of nicotine dependence: Secondary | ICD-10-CM | POA: Insufficient documentation

## 2021-11-18 DIAGNOSIS — I10 Essential (primary) hypertension: Secondary | ICD-10-CM | POA: Diagnosis not present

## 2021-11-18 DIAGNOSIS — R600 Localized edema: Secondary | ICD-10-CM | POA: Diagnosis not present

## 2021-11-18 DIAGNOSIS — R0981 Nasal congestion: Secondary | ICD-10-CM | POA: Diagnosis not present

## 2021-11-18 DIAGNOSIS — E119 Type 2 diabetes mellitus without complications: Secondary | ICD-10-CM | POA: Insufficient documentation

## 2021-11-18 DIAGNOSIS — Z79899 Other long term (current) drug therapy: Secondary | ICD-10-CM | POA: Diagnosis not present

## 2021-11-18 LAB — BASIC METABOLIC PANEL
Anion gap: 9 (ref 5–15)
BUN: 12 mg/dL (ref 6–20)
CO2: 24 mmol/L (ref 22–32)
Calcium: 8.9 mg/dL (ref 8.9–10.3)
Chloride: 106 mmol/L (ref 98–111)
Creatinine, Ser: 0.96 mg/dL (ref 0.61–1.24)
GFR, Estimated: >60 mL/min (ref >60)
Glucose, Bld: 170 mg/dL — ABNORMAL HIGH (ref 70–99)
Potassium: 3.7 mmol/L (ref 3.5–5.1)
Sodium: 139 mmol/L (ref 135–145)

## 2021-11-18 LAB — CBC WITH DIFFERENTIAL/PLATELET
Abs Immature Granulocytes: 0.08 10*3/uL — ABNORMAL HIGH (ref 0.00–0.07)
Basophils Absolute: 0 10*3/uL (ref 0.0–0.1)
Basophils Relative: 0 %
Eosinophils Absolute: 0 10*3/uL (ref 0.0–0.5)
Eosinophils Relative: 0 %
HCT: 48.6 % (ref 39.0–52.0)
Hemoglobin: 15.5 g/dL (ref 13.0–17.0)
Immature Granulocytes: 1 %
Lymphocytes Relative: 4 %
Lymphs Abs: 0.4 10*3/uL — ABNORMAL LOW (ref 0.7–4.0)
MCH: 26.1 pg (ref 26.0–34.0)
MCHC: 31.9 g/dL (ref 30.0–36.0)
MCV: 82 fL (ref 80.0–100.0)
Monocytes Absolute: 0.1 10*3/uL (ref 0.1–1.0)
Monocytes Relative: 1 %
Neutro Abs: 10.6 10*3/uL — ABNORMAL HIGH (ref 1.7–7.7)
Neutrophils Relative %: 94 %
Platelets: 278 10*3/uL (ref 150–400)
RBC: 5.93 MIL/uL — ABNORMAL HIGH (ref 4.22–5.81)
RDW: 17.4 % — ABNORMAL HIGH (ref 11.5–15.5)
WBC: 11.2 10*3/uL — ABNORMAL HIGH (ref 4.0–10.5)
nRBC: 0 % (ref 0.0–0.2)

## 2021-11-18 LAB — RESP PANEL BY RT-PCR (FLU A&B, COVID) ARPGX2
Influenza A by PCR: NEGATIVE
Influenza B by PCR: NEGATIVE
SARS Coronavirus 2 by RT PCR: NEGATIVE

## 2021-11-18 LAB — D-DIMER, QUANTITATIVE: D-Dimer, Quant: 0.7 ug/mL-FEU — ABNORMAL HIGH (ref 0.00–0.50)

## 2021-11-18 LAB — BRAIN NATRIURETIC PEPTIDE: B Natriuretic Peptide: 36 pg/mL (ref 0.0–100.0)

## 2021-11-18 MED ORDER — ALBUTEROL SULFATE HFA 108 (90 BASE) MCG/ACT IN AERS
2.0000 | INHALATION_SPRAY | Freq: Once | RESPIRATORY_TRACT | Status: AC
  Administered 2021-11-18: 2 via RESPIRATORY_TRACT
  Filled 2021-11-18: qty 6.7

## 2021-11-18 MED ORDER — IOHEXOL 350 MG/ML SOLN
100.0000 mL | Freq: Once | INTRAVENOUS | Status: AC | PRN
  Administered 2021-11-18: 100 mL via INTRAVENOUS

## 2021-11-18 NOTE — Discharge Instructions (Addendum)
You have been evaluated for your shortness of breath.  No evidence of lung infection or evidence of blood clot in your lung on today's exam.  Please use albuterol inhaler 2 puffs every 4 hours as needed for shortness of breath.  You will likely benefit from a sleep study for further assessment of your shortness of breath.

## 2021-11-18 NOTE — ED Notes (Signed)
Patient transported to CT 

## 2021-11-18 NOTE — ED Triage Notes (Signed)
SOB x 6 months, worse today. Sat in upper 80s when EMS arrived. 1 albuterol treatment given. Pt sats 95% on room air in ED.

## 2021-11-18 NOTE — ED Notes (Signed)
Patient transported to X-ray 

## 2021-11-18 NOTE — ED Provider Notes (Signed)
[facility] EMERGENCY DEPARTMENT Provider Note   CSN: [id number] Arrival date & time: 11/18/21  1048     History Chief Complaint  Patient presents with   Shortness of Breath    SOB x 6 months worse today. Started on steroids yesterday. Increase swelling in legs    Jason Meyer is a 60 y.o. male.  The history is provided by the patient and medical records. No language interpreter was used.  Shortness of Breath  60 year old morbidly obese male with significant history of chest exertion, diabetes, chronic venous stasis present to the ER via EMS from home for evaluation of shortness of breath.  Patient reports since May of this year has been having recurrent shortness of breath.  Shortness of breath is more noticeable with exertion.  He has been seen by cardiologist as well as pulmonologist several times for this without any definitive diagnosis.  He endorsed increased shortness of breath for the past 2 days last seen by his PCP yesterday for his complaint.  It was felt that it could be related to nasal congestion and he was prescribed prednisone as well as a different type of nasal spray.  He did use the medication as prescribed today states he is still having trouble breathing this morning.  Patient denies having fever chills productive cough cold symptoms chest pain or increased fluid retention.  He has not had a sleep study yet.  He denies any history of prior PE or DVT.  Reports symptoms moderate in severity.  Past Medical History:  Diagnosis Date   Allergy    Hyperlipidemia    Hypertension    Type 2 diabetes mellitus [facility])     Patient Active Problem List   Diagnosis Date Noted   DOE (dyspnea on exertion) 06/22/2021   OA (osteoarthritis) 11/23/2015   Venous stasis 09/20/2015   Type 2 diabetes mellitus with other specified complication ([place]) 87/56/4332   Hypertension associated with diabetes ([place]) 04/23/2013   Morbid obesity ([place]) 04/23/2013   Hyperlipidemia associated with  type 2 diabetes mellitus ([place]) 04/23/2013    Past Surgical History:  Procedure Laterality Date   NO PAST SURGERIES         Family History  Problem Relation Age of Onset   Heart attack Brother    Alzheimer's disease Mother    Heart attack Father    Stroke Father     Social History   Tobacco Use   Smoking status: Former    Packs/day: 1.00    Years: 33.00    Pack years: 33.00    Types: Cigarettes    Quit date: 12/12/2007    Years since quitting: 13.9   Smokeless tobacco: Never  Vaping Use   Vaping Use: Never used  Substance Use Topics   Alcohol use: No   Drug use: No    Home Medications Prior to Admission medications   Medication Sig Start Date End Date Taking? Authorizing Provider  Accu-Chek Softclix Lancets lancets Test BID Dx E13.9 03/06/19   Dettinger, Fransisca Kaufmann, MD  albuterol (PROVENTIL) (2.5 MG/3ML) 0.083% nebulizer solution Take 3 mLs by nebulization every 4 hours as needed for wheezing or shortness of breath. 07/11/21   Dettinger, Fransisca Kaufmann, MD  amLODipine (NORVASC) 10 MG tablet Take 1 tablet (10 mg total) by mouth daily. 01/19/21   Dettinger, Fransisca Kaufmann, MD  atorvastatin (LIPITOR) 80 MG tablet Take 1 tablet (80 mg total) by mouth daily. 09/16/21   Dettinger, Fransisca Kaufmann, MD  azelastine (ASTELIN) 0.1 % nasal  spray Place 1 spray into both nostrils 2 (two) times daily. Use in each nostril as directed 11/17/21   Dettinger, Fransisca Kaufmann, MD  bisoprolol (ZEBETA) 10 MG tablet Take 1 tablet (10 mg total) by mouth daily. 10/13/21   Tanda Rockers, MD  blood glucose meter kit and supplies KIT Test BID Dx E13.9 03/06/19   Dettinger, Fransisca Kaufmann, MD  diclofenac (VOLTAREN) 50 MG EC tablet Take 1 tablet (50 mg total) by mouth 2 (two) times daily. 11/02/21   Dettinger, Fransisca Kaufmann, MD  Dulaglutide (TRULICITY) 1.5 DT/2.6ZT SOPN Inject 1.5 mg into the skin once a week. 01/19/21   Dettinger, Fransisca Kaufmann, MD  empagliflozin (JARDIANCE) 10 MG TABS tablet Take 1 tablet (10 mg total) by mouth daily. 01/19/21    Dettinger, Fransisca Kaufmann, MD  fluticasone (FLONASE) 50 MCG/ACT nasal spray Place 2 sprays into both nostrils daily. 04/02/17   Dettinger, Fransisca Kaufmann, MD  furosemide (LASIX) 20 MG tablet Take 1 tablet (20 mg total) by mouth daily as needed for fluid or edema. 09/15/20   Dettinger, Fransisca Kaufmann, MD  glucose blood (ACCU-CHEK AVIVA PLUS) test strip Test BID Dx E13.9 03/06/19   Dettinger, Fransisca Kaufmann, MD  hydrALAZINE (APRESOLINE) 50 MG tablet Take 1 tablet (50 mg total) by mouth 2 (two) times daily. 09/26/21   Verta Ellen., NP  metFORMIN (GLUCOPHAGE) 1000 MG tablet Take 1 tablet (1,000 mg total) by mouth 2 (two) times daily with a meal. 01/19/21   Dettinger, Fransisca Kaufmann, MD  montelukast (SINGULAIR) 10 MG tablet Take 1 tablet (10 mg total) by mouth at bedtime. 11/17/21   Dettinger, Fransisca Kaufmann, MD  olmesartan (BENICAR) 40 MG tablet Take 1 tablet (40 mg total) by mouth daily. 10/13/21 10/13/22  Tanda Rockers, MD  potassium chloride (MICRO-K) 10 MEQ CR capsule Take 1 capsule (10 mEq total) by mouth daily as needed. 09/15/20   Dettinger, Fransisca Kaufmann, MD  predniSONE (DELTASONE) 20 MG tablet 2 po at same time daily for 5 days 11/17/21   Dettinger, Fransisca Kaufmann, MD  sildenafil (REVATIO) 20 MG tablet 2-5 pills once daily as needed for erectile dysfunction 07/07/19   Dettinger, Fransisca Kaufmann, MD  tamsulosin (FLOMAX) 0.4 MG CAPS capsule Take 0.4 mg by mouth daily. 06/21/21   [provider]    Allergies    Penicillins  Review of Systems   Review of Systems  Respiratory:  Positive for shortness of breath.   All other systems reviewed and are negative.  Physical Exam Updated Vital Signs BP (!) 177/75 (BP Location: Left Arm)   Pulse 83   Temp 98.1 F (36.7 C)   Resp 20   Ht 5' 9"  (1.753 m)   Wt (!) 184.2 kg   SpO2 95%   BMI 59.96 kg/m   Physical Exam Vitals and nursing note reviewed.  Constitutional:      General: He is not in acute distress.    Appearance: He is well-developed. He is obese.     Comments: Morbidly obese  male sitting in bed appears to be in no acute respiratory discomfort.  HENT:     Head: Atraumatic.  Eyes:     Conjunctiva/sclera: Conjunctivae normal.  Neck:     Vascular: No JVD.     Comments: No JVD Cardiovascular:     Rate and Rhythm: Normal rate and regular rhythm.  Pulmonary:     Effort: Pulmonary effort is normal. No accessory muscle usage or respiratory distress.     Breath sounds:  Normal breath sounds. No decreased breath sounds, wheezing, rhonchi or rales.  Chest:     Chest wall: No tenderness.  Musculoskeletal:     Cervical back: Neck supple.     Right lower leg: Edema present.     Left lower leg: Edema present.     Comments: Chronic venous stasis and chronic edema to bilateral lower extremities  Skin:    Findings: No rash.  Neurological:     Mental Status: He is alert and oriented to person, place, and time.    ED Results / Procedures / Treatments   Labs (all labs ordered are listed, but only abnormal results are displayed) Labs Reviewed  BASIC METABOLIC PANEL - Abnormal; Notable for the following components:      Result Value   Glucose, Bld 170 (*)    All other components within normal limits  CBC WITH DIFFERENTIAL/PLATELET - Abnormal; Notable for the following components:   WBC 11.2 (*)    RBC 5.93 (*)    RDW 17.4 (*)    Neutro Abs 10.6 (*)    Lymphs Abs 0.4 (*)    Abs Immature Granulocytes 0.08 (*)    All other components within normal limits  D-DIMER, QUANTITATIVE - Abnormal; Notable for the following components:   D-Dimer, Quant 0.70 (*)    All other components within normal limits  RESP PANEL BY RT-PCR (FLU A&B, COVID) ARPGX2  BRAIN NATRIURETIC PEPTIDE    EKG None  Radiology DG Chest 2 View  Result Date: 11/18/2021 CLINICAL DATA:  Increased sob x 6 months worsening since last night/htn/diabetic/ex smoker No covid test ordered at this timeSOB EXAM: CHEST - 2 VIEW COMPARISON:  None. FINDINGS: Normal cardiac silhouette. There is central venous  congestion. No focal consolidation. No overt pulmonary edema. No pneumothorax. Degenerative osteophytosis of the spine. IMPRESSION: Central venous congestion.  No overt pulmonary edema Electronically Signed   By: Suzy Bouchard M.D.   On: 11/18/2021 11:38   CT Angio Chest PE W and/or Wo Contrast  Result Date: 11/18/2021 CLINICAL DATA:  Chest pain, short of breath EXAM: CT ANGIOGRAPHY CHEST WITH CONTRAST TECHNIQUE: Multidetector CT imaging of the chest was performed using the standard protocol during bolus administration of intravenous contrast. Multiplanar CT image reconstructions and MIPs were obtained to evaluate the vascular anatomy. CONTRAST:  162m OMNIPAQUE IOHEXOL 350 MG/ML SOLN COMPARISON:  CT chest 08/17/2021 FINDINGS: Cardiovascular: No filling defects within the pulmonary arteries to suggest acute pulmonary embolism. Coronary artery calcification and aortic atherosclerotic calcification. Mediastinum/Nodes: No axillary or supraclavicular adenopathy. No mediastinal or hilar adenopathy. No pericardial fluid. Esophagus normal. No suspicious pulmonary nodules. Normal pleural. Airways normal. Lungs/Pleura: Lungs are clear. No pleural effusion or pneumothorax. Upper Abdomen: No acute abnormality. Musculoskeletal: No chest wall abnormality. No acute or significant osseous findings. Review of the MIP images confirms the above findings. IMPRESSION: 1. No evidence acute pulmonary embolism. 2. No infarction or pneumonia. 3. Coronary artery calcification and Aortic Atherosclerosis (ICD10-I70.0). Electronically Signed   By: SSuzy BouchardM.D.   On: 11/18/2021 18:23    Procedures Procedures   Medications Ordered in ED Medications  albuterol (VENTOLIN HFA) 108 (90 Base) MCG/ACT inhaler 2 puff (has no administration in time range)  iohexol (OMNIPAQUE) 350 MG/ML injection 100 mL (100 mLs Intravenous Contrast Given 11/18/21 1557)    ED Course  I have reviewed the triage vital signs and the nursing  notes.  Pertinent labs & imaging results that were available during my care of the patient were reviewed  by me and considered in my medical decision making (see chart for details).    MDM Rules/Calculators/A&P                           BP (!) 166/88   Pulse 83   Temp 98.1 F (36.7 C)   Resp 16   Ht 5' 9"  (1.753 m)   Wt (!) 177.8 kg   SpO2 95%   BMI 57.89 kg/m   Final Clinical Impression(s) / ED Diagnoses Final diagnoses:  Shortness of breath    Rx / DC Orders ED Discharge Orders     None      11:46 AM Patient endorsed recurrent exertional shortness of breath which has been ongoing for half of this year.  Also mention been seen by cardiology as well as pulmonology for his symptoms without definitive diagnosis.  He is here endorsing progressive worsening shortness of breath for the past few days.  Patient is morbidly obese which makes examination difficult.  However he is in no acute respiratory discomfort at this time.  Initial chest x-ray demonstrated central venous congestion without overt pulmonary edema.  Patient also without any cold symptoms.  I suspect some of his symptoms could be due to potential undiagnosed OSA as he has not had a sleep study yet according to patient.  Work-up initiated.  1:53 PM Labs are reassuring, normal BNP, white count of 7.2, negative viral respiratory panel, chest x-ray demonstrate central venous congestion without overt pulmonary edema.  When ambulating, O2 sat is 90%.  With this in mind, will obtain chest CT angiogram to rule out PE.  6:38 PM Fortunately CT scan without any evidence of PE or acute finding.  I discussed this finding with patient.  Encourage patient to follow-up for a sleep study outpatient as recommended.  Will provide an albuterol inhaler to use as needed for his shortness of breath.  Return precaution given.  Patient voiced understanding and agrees with plan.   Domenic Moras, PA-C 11/18/21 1850    Fredia Sorrow,  MD 11/24/21 [phone]

## 2021-11-18 NOTE — ED Notes (Signed)
Dc instructions reviewed with pt and spouse. No questions or concerns at this time. Will follow up with pcp. Pt wheeled out to truck

## 2021-11-18 NOTE — ED Notes (Signed)
Pt sitting up in bed with wife at side. Denies any pain or any needs at this time. Call bell in reach.

## 2021-11-18 NOTE — ED Notes (Signed)
Pt sats 90% with ambulation

## 2021-11-24 ENCOUNTER — Ambulatory Visit: Payer: PRIVATE HEALTH INSURANCE | Admitting: Family Medicine

## 2021-11-24 ENCOUNTER — Encounter: Payer: Self-pay | Admitting: Family Medicine

## 2021-11-24 VITALS — BP 150/83 | HR 81 | Ht 69.0 in | Wt 391.0 lb

## 2021-11-24 DIAGNOSIS — E662 Morbid (severe) obesity with alveolar hypoventilation: Secondary | ICD-10-CM

## 2021-11-24 DIAGNOSIS — J4521 Mild intermittent asthma with (acute) exacerbation: Secondary | ICD-10-CM | POA: Diagnosis not present

## 2021-11-24 DIAGNOSIS — R0602 Shortness of breath: Secondary | ICD-10-CM

## 2021-11-24 DIAGNOSIS — G4730 Sleep apnea, unspecified: Secondary | ICD-10-CM

## 2021-11-24 MED ORDER — ALBUTEROL SULFATE HFA 108 (90 BASE) MCG/ACT IN AERS: 2.0000 | INHALATION_SPRAY | Freq: Four times a day (QID) | RESPIRATORY_TRACT | 2 refills | Status: DC | PRN

## 2021-11-24 MED ORDER — ALBUTEROL SULFATE (2.5 MG/3ML) 0.083% IN NEBU: 2.5000 mg | INHALATION_SOLUTION | RESPIRATORY_TRACT | 1 refills | Status: DC | PRN

## 2021-11-24 NOTE — Progress Notes (Signed)
BP (!) 150/83    Pulse 81    Ht 5\' 9"  (1.753 m)    Wt (!) 391 lb (177.4 kg)    SpO2 96%    BMI 57.74 kg/m    Subjective:   Patient ID: Jason Meyer, male    DOB: 03-25-1961, 60 y.o.   MRN: [id number]  HPI: Jason Meyer is a 60 y.o. male presenting on 11/24/2021 for ER follow up and Shortness of Breath   HPI Shortness of breath Patient is coming in today for ER follow-up for shortness of breath.  He was in the emergency department on 11/18/2021 after being seen here.  He says his breathing got worse despite taking the prednisone.  He says he is feeling a lot better.  He has been taking the albuterol more around-the-clock and feels like it made a big difference.  During the ER visit they did make an observation that he had some symptoms that could be categorized as sleep apnea and need a referral for that.  He has snoring and possibly witnessed apnea is what they have said.  Relevant past medical, surgical, family and social history reviewed and updated as indicated. Interim medical history since our last visit reviewed. Allergies and medications reviewed and updated.  Review of Systems  Constitutional:  Negative for chills and fever.  HENT:  Positive for congestion and voice change.   Eyes:  Negative for visual disturbance.  Respiratory:  Positive for cough and shortness of breath. Negative for wheezing.   Cardiovascular:  Negative for chest pain and leg swelling.  Musculoskeletal:  Negative for back pain and gait problem.  Skin:  Negative for rash.  All other systems reviewed and are negative.  Per HPI unless specifically indicated above   Allergies as of 11/24/2021       Reactions   Penicillins         Medication List        Accurate as of November 24, 2021 11:27 AM. If you have any questions, ask your nurse or doctor.          STOP taking these medications    predniSONE 20 MG tablet Commonly known as: DELTASONE Stopped by: Fransisca Kaufmann Lounell Schumacher, MD        TAKE these medications    albuterol 108 (90 Base) MCG/ACT inhaler Commonly known as: VENTOLIN HFA Inhale 2 puffs into the lungs every 6 (six) hours as needed for wheezing or shortness of breath. What changed: You were already taking a medication with the same name, and this prescription was added. Make sure you understand how and when to take each. Changed by: Fransisca Kaufmann Tanelle Lanzo, MD   albuterol (2.5 MG/3ML) 0.083% nebulizer solution Commonly known as: PROVENTIL Take 3 mLs (2.5 mg total) by nebulization every 4 (four) hours as needed for wheezing or shortness of breath. What changed: See the new instructions. Changed by: Fransisca Kaufmann Ronan Duecker, MD   amLODipine 10 MG tablet Commonly known as: NORVASC Take 1 tablet (10 mg total) by mouth daily.   atorvastatin 80 MG tablet Commonly known as: LIPITOR Take 1 tablet (80 mg total) by mouth daily.   azelastine 0.1 % nasal spray Commonly known as: ASTELIN Place 1 spray into both nostrils 2 (two) times daily. Use in each nostril as directed   bisoprolol 10 MG tablet Commonly known as: ZEBETA Take 1 tablet (10 mg total) by mouth daily.   diclofenac 50 MG EC tablet Commonly known as: VOLTAREN Take 1 tablet (  50 mg total) by mouth 2 (two) times daily.   empagliflozin 10 MG Tabs tablet Commonly known as: Jardiance Take 1 tablet (10 mg total) by mouth daily.   fluticasone 50 MCG/ACT nasal spray Commonly known as: FLONASE Place 2 sprays into both nostrils daily.   furosemide 20 MG tablet Commonly known as: LASIX Take 1 tablet (20 mg total) by mouth daily as needed for fluid or edema.   hydrALAZINE 50 MG tablet Commonly known as: APRESOLINE Take 1 tablet (50 mg total) by mouth 2 (two) times daily.   metFORMIN 1000 MG tablet Commonly known as: GLUCOPHAGE Take 1 tablet (1,000 mg total) by mouth 2 (two) times daily with a meal.   montelukast 10 MG tablet Commonly known as: SINGULAIR Take 1 tablet (10 mg total) by mouth at bedtime.    olmesartan 40 MG tablet Commonly known as: BENICAR Take 1 tablet (40 mg total) by mouth daily.   potassium chloride 10 MEQ CR capsule Commonly known as: MICRO-K Take 1 capsule (10 mEq total) by mouth daily as needed.   sildenafil 20 MG tablet Commonly known as: Revatio 2-5 pills once daily as needed for erectile dysfunction   tamsulosin 0.4 MG Caps capsule Commonly known as: FLOMAX Take 0.4 mg by mouth daily.   Trulicity 1.5 MG/0.5ML Sopn Generic drug: Dulaglutide Inject 1.5 mg into the skin once a week.         Objective:   BP (!) 150/83    Pulse 81    Ht 5\' 9"  (1.753 m)    Wt (!) 391 lb (177.4 kg)    SpO2 96%    BMI 57.74 kg/m   Wt Readings from Last 3 Encounters:  11/24/21 (!) 391 lb (177.4 kg)  11/18/21 (!) 392 lb (177.8 kg)  11/17/21 (!) 400 lb (181.4 kg)    Physical Exam Vitals and nursing note reviewed.  Constitutional:      General: He is not in acute distress.    Appearance: He is well-developed. He is obese. He is not diaphoretic.  Eyes:     General: No scleral icterus.    Conjunctiva/sclera: Conjunctivae normal.  Neck:     Thyroid: No thyromegaly.  Cardiovascular:     Rate and Rhythm: Normal rate and regular rhythm.     Heart sounds: Normal heart sounds. No murmur heard. Pulmonary:     Effort: Pulmonary effort is normal. No respiratory distress.     Breath sounds: Normal breath sounds. No wheezing.  Musculoskeletal:        General: Normal range of motion.     Cervical back: Neck supple.  Lymphadenopathy:     Cervical: No cervical adenopathy.  Skin:    General: Skin is warm and dry.     Findings: No rash.  Neurological:     Mental Status: He is alert and oriented to person, place, and time.     Coordination: Coordination normal.  Psychiatric:        Behavior: Behavior normal.      Assessment & Plan:   Problem List Items Addressed This Visit   None Visit Diagnoses     Shortness of breath    -  Primary   Relevant Medications    albuterol (VENTOLIN HFA) 108 (90 Base) MCG/ACT inhaler   albuterol (PROVENTIL) (2.5 MG/3ML) 0.083% nebulizer solution   Mild intermittent reactive airway disease with acute exacerbation       Relevant Medications   albuterol (VENTOLIN HFA) 108 (90 Base) MCG/ACT inhaler  albuterol (PROVENTIL) (2.5 MG/3ML) 0.083% nebulizer solution   Obesity hypoventilation syndrome (HCC)       Relevant Medications   albuterol (VENTOLIN HFA) 108 (90 Base) MCG/ACT inhaler   albuterol (PROVENTIL) (2.5 MG/3ML) 0.083% nebulizer solution   Other Relevant Orders   Ambulatory referral to Sleep Studies   Sleep apnea, unspecified type       Relevant Orders   Ambulatory referral to Sleep Studies     Did referral for sleep study, seems to be doing okay on albuterol right now, we will continue this Until we can find any further answers.  Follow up plan: Return if symptoms worsen or fail to improve.  Counseling provided for all of the vaccine components Orders Placed This Encounter  Procedures   Ambulatory referral to Sleep Studies     Arville Care, MD [facility] 11/24/2021, 11:27 AM

## 2021-11-30 NOTE — Progress Notes (Deleted)
Cardiology Office Note  Date: 11/30/2021   ID: Jason Meyer, DOB 12-26-1960, MRN [id number]  PCP:  Dettinger, Elige Radon, MD  Cardiologist:  Nona Dell, MD Electrophysiologist:  None   No chief complaint on file.   History of Present Illness: Jason Meyer is a 60 y.o. male last seen in October by Mr. Vincenza Hews NP.  He was seen recently in the ER at [facility] for evaluation of shortness of breath.  D-dimer was mildly elevated at 0.7 but chest CTA showed no evidence of pulmonary embolus.  BNP was normal at 36.  He was negative for influenza A and COVID-19.  He has known coronary artery calcification by CT imaging, LVEF was 60 to 65% by echocardiogram in September with moderate LVH and normal RV contraction.  He has been more recently seen by his PCP and referred for sleep testing.  Past Medical History:  Diagnosis Date   Allergy    Hyperlipidemia    Hypertension    Type 2 diabetes mellitus (HCC)     Past Surgical History:  Procedure Laterality Date   NO PAST SURGERIES      Current Outpatient Medications  Medication Sig Dispense Refill   albuterol (PROVENTIL) (2.5 MG/3ML) 0.083% nebulizer solution Take 3 mLs (2.5 mg total) by nebulization every 4 (four) hours as needed for wheezing or shortness of breath. 90 mL 1   albuterol (VENTOLIN HFA) 108 (90 Base) MCG/ACT inhaler Inhale 2 puffs into the lungs every 6 (six) hours as needed for wheezing or shortness of breath. 8 g 2   amLODipine (NORVASC) 10 MG tablet Take 1 tablet (10 mg total) by mouth daily. 90 tablet 3   atorvastatin (LIPITOR) 80 MG tablet Take 1 tablet (80 mg total) by mouth daily. 90 tablet 3   azelastine (ASTELIN) 0.1 % nasal spray Place 1 spray into both nostrils 2 (two) times daily. Use in each nostril as directed 30 mL 12   bisoprolol (ZEBETA) 10 MG tablet Take 1 tablet (10 mg total) by mouth daily. 60 tablet 11   diclofenac (VOLTAREN) 50 MG EC tablet Take 1 tablet (50 mg total) by mouth 2 (two) times  daily. 60 tablet 1   Dulaglutide (TRULICITY) 1.5 MG/0.5ML SOPN Inject 1.5 mg into the skin once a week. 6 mL 3   empagliflozin (JARDIANCE) 10 MG TABS tablet Take 1 tablet (10 mg total) by mouth daily. 90 tablet 3   fluticasone (FLONASE) 50 MCG/ACT nasal spray Place 2 sprays into both nostrils daily. 16 g 6   furosemide (LASIX) 20 MG tablet Take 1 tablet (20 mg total) by mouth daily as needed for fluid or edema. 6 tablet 3   hydrALAZINE (APRESOLINE) 50 MG tablet Take 1 tablet (50 mg total) by mouth 2 (two) times daily. 180 tablet 1   metFORMIN (GLUCOPHAGE) 1000 MG tablet Take 1 tablet (1,000 mg total) by mouth 2 (two) times daily with a meal. 180 tablet 3   montelukast (SINGULAIR) 10 MG tablet Take 1 tablet (10 mg total) by mouth at bedtime. 30 tablet 3   olmesartan (BENICAR) 40 MG tablet Take 1 tablet (40 mg total) by mouth daily. 30 tablet 11   potassium chloride (MICRO-K) 10 MEQ CR capsule Take 1 capsule (10 mEq total) by mouth daily as needed. 7 capsule 3   sildenafil (REVATIO) 20 MG tablet 2-5 pills once daily as needed for erectile dysfunction 50 tablet 3   tamsulosin (FLOMAX) 0.4 MG CAPS capsule Take 0.4 mg by  mouth daily.     No current facility-administered medications for this visit.   Allergies:  Penicillins   Social History: The patient  reports that he quit smoking about 13 years ago. His smoking use included cigarettes. He has a 33.00 pack-year smoking history. He has never used smokeless tobacco. He reports that he does not drink alcohol and does not use drugs.   Family History: The patient's family history includes Alzheimer's disease in his mother; Heart attack in his brother and father; Stroke in his father.   ROS:  Please see the history of present illness. Otherwise, complete review of systems is positive for {NONE DEFAULTED:18576}.  All other systems are reviewed and negative.   Physical Exam: VS:  There were no vitals taken for this visit., BMI There is no height or  weight on file to calculate BMI.  Wt Readings from Last 3 Encounters:  11/24/21 (!) 391 lb (177.4 kg)  11/18/21 (!) 392 lb (177.8 kg)  11/17/21 (!) 400 lb (181.4 kg)    General: Patient appears comfortable at rest. HEENT: Conjunctiva and lids normal, oropharynx clear with moist mucosa. Neck: Supple, no elevated JVP or carotid bruits, no thyromegaly. Lungs: Clear to auscultation, nonlabored breathing at rest. Cardiac: Regular rate and rhythm, no S3 or significant systolic murmur, no pericardial rub. Abdomen: Soft, nontender, no hepatomegaly, bowel sounds present, no guarding or rebound. Extremities: No pitting edema, distal pulses 2+. Skin: Warm and dry. Musculoskeletal: No kyphosis. Neuropsychiatric: Alert and oriented x3, affect grossly appropriate.  ECG:   No recent tracing for review today.  Recent Labwork: 01/19/2021: ALT 18; AST 18 10/13/2021: TSH 1.650 11/18/2021: B Natriuretic Peptide 36.0; BUN 12; Creatinine, Ser 0.96; Hemoglobin 15.5; Platelets 278; Potassium 3.7; Sodium 139     Component Value Date/Time   CHOL 147 09/16/2021 0935   TRIG 97 09/16/2021 0935   HDL 34 (L) 09/16/2021 0935   CHOLHDL 4.3 09/16/2021 0935   LDLCALC 95 09/16/2021 0935    Other Studies Reviewed Today:  Echocardiogram 09/06/2021:  1. Left ventricular ejection fraction, by estimation, is 60 to 65%. The  left ventricle has normal function. Left ventricular endocardial border  not optimally defined to evaluate regional wall motion. There is moderate  concentric left ventricular  hypertrophy. Left ventricular diastolic parameters were normal.   2. Right ventricular systolic function is normal. The right ventricular  size is normal. Tricuspid regurgitation signal is inadequate for assessing  PA pressure.   3. The mitral valve is grossly normal. Trivial mitral valve  regurgitation.   4. The aortic valve was not well visualized. Aortic valve regurgitation  is not visualized.   5. The inferior vena  cava is dilated in size with >50% respiratory  variability, suggesting right atrial pressure of 8 mmHg.   Chest CTA 11/18/2021: FINDINGS: Cardiovascular: No filling defects within the pulmonary arteries to suggest acute pulmonary embolism. Coronary artery calcification and aortic atherosclerotic calcification.   Mediastinum/Nodes: No axillary or supraclavicular adenopathy. No mediastinal or hilar adenopathy. No pericardial fluid. Esophagus normal. No suspicious pulmonary nodules. Normal pleural. Airways normal.   Lungs/Pleura: Lungs are clear. No pleural effusion or pneumothorax.   Upper Abdomen: No acute abnormality.   Musculoskeletal: No chest wall abnormality. No acute or significant osseous findings.   Review of the MIP images confirms the above findings.   IMPRESSION: 1. No evidence acute pulmonary embolism. 2. No infarction or pneumonia. 3. Coronary artery calcification and Aortic Atherosclerosis (ICD10-I70.0).  Assessment and Plan:    Medication Adjustments/Labs and  Tests Ordered: Current medicines are reviewed at length with the patient today.  Concerns regarding medicines are outlined above.   Tests Ordered: No orders of the defined types were placed in this encounter.   Medication Changes: No orders of the defined types were placed in this encounter.   Disposition:  Follow up {follow up:15908}  Signed, Jonelle Sidle, MD, [facility] 11/30/2021 12:39 PM    [facility] [address] [place], [place], [place] [address] Phone: [phone]; Fax: [phone]

## 2021-12-01 ENCOUNTER — Ambulatory Visit: Payer: BLUE CROSS/BLUE SHIELD | Admitting: Cardiology

## 2021-12-01 ENCOUNTER — Encounter: Payer: Self-pay | Admitting: Cardiology

## 2021-12-03 ENCOUNTER — Other Ambulatory Visit: Payer: Self-pay | Admitting: Family Medicine

## 2021-12-03 DIAGNOSIS — E139 Other specified diabetes mellitus without complications: Secondary | ICD-10-CM

## 2021-12-22 ENCOUNTER — Ambulatory Visit (INDEPENDENT_AMBULATORY_CARE_PROVIDER_SITE_OTHER): Payer: PRIVATE HEALTH INSURANCE | Admitting: Family Medicine

## 2021-12-22 ENCOUNTER — Encounter: Payer: Self-pay | Admitting: Family Medicine

## 2021-12-22 VITALS — BP 163/85 | HR 69 | Ht 69.0 in | Wt 393.0 lb

## 2021-12-22 DIAGNOSIS — I152 Hypertension secondary to endocrine disorders: Secondary | ICD-10-CM | POA: Diagnosis not present

## 2021-12-22 DIAGNOSIS — L0390 Cellulitis, unspecified: Secondary | ICD-10-CM

## 2021-12-22 DIAGNOSIS — E785 Hyperlipidemia, unspecified: Secondary | ICD-10-CM

## 2021-12-22 DIAGNOSIS — E1169 Type 2 diabetes mellitus with other specified complication: Secondary | ICD-10-CM

## 2021-12-22 DIAGNOSIS — E1159 Type 2 diabetes mellitus with other circulatory complications: Secondary | ICD-10-CM

## 2021-12-22 LAB — BAYER DCA HB A1C WAIVED: HB A1C (BAYER DCA - WAIVED): 6.5 % — ABNORMAL HIGH (ref 4.8–5.6)

## 2021-12-22 MED ORDER — EMPAGLIFLOZIN 10 MG PO TABS: 10.0000 mg | ORAL_TABLET | Freq: Every day | ORAL | 0 refills | Status: DC

## 2021-12-22 MED ORDER — BISOPROLOL FUMARATE 10 MG PO TABS: 20.0000 mg | ORAL_TABLET | Freq: Every day | ORAL | 3 refills | Status: DC

## 2021-12-22 MED ORDER — METFORMIN HCL 1000 MG PO TABS: 1000.0000 mg | ORAL_TABLET | Freq: Two times a day (BID) | ORAL | 3 refills | Status: DC

## 2021-12-22 MED ORDER — SULFAMETHOXAZOLE-TRIMETHOPRIM 800-160 MG PO TABS: 1.0000 | ORAL_TABLET | Freq: Two times a day (BID) | ORAL | 0 refills | Status: DC

## 2021-12-22 NOTE — Progress Notes (Signed)
BP (!) 163/85    Pulse 69    Ht 5' 9" (1.753 m)    Wt (!) 393 lb (178.3 kg)    SpO2 95%    BMI 58.04 kg/m    Subjective:   Patient ID: Jason Meyer, male    DOB: 1961/03/28, 61 y.o.   MRN: [id number]  HPI: Jason Meyer is a 61 y.o. male presenting on 12/22/2021 for Medical Management of Chronic Issues, Diabetes, and Hypertension   HPI Type 2 diabetes mellitus Patient comes in today for recheck of his diabetes. Patient has been currently taking trulicity and jardiance and metformin. Patient is currently on an ACE inhibitor/ARB. Patient has seen an ophthalmologist this year. Patient denies any issues with their feet. The symptom started onset as an adult hypertension and hyperlipidemia ARE RELATED TO DM   Hypertension Patient is currently on amlodipine and olmesartan and bisoprolol, and their blood pressure today is 163/85. Patient denies any lightheadedness or dizziness. Patient denies headaches, blurred vision, chest pains, shortness of breath, or weakness. Denies any side effects from medication and is content with current medication.   Hyperlipidemia Patient is coming in for recheck of his hyperlipidemia. The patient is currently taking wound atorvastatin. They deny any issues with myalgias or history of liver damage from it. They deny any focal numbness or weakness or chest pain.   Patient still feels short of breath on exertion.  He has seen pulmonology and cardiology and has not had any answers.  Recommend that sleep study and has not yet done that but he will do that and get scheduled.  He does possibly fit syndrome obesity hypoventilation syndrome and we discussed possibly trying for weight loss but he denies that being the issue and does not seem to be on board with trying for weight loss especially since he already has shortness of breath on exertion.  Patient has some nodules on the back of his scalp from a recent haircut to have some purulent drainage and are slightly  tender.  He says they have been going on for a week or 2.  He denies any fevers or chills or any skin spots anywhere else.  Relevant past medical, surgical, family and social history reviewed and updated as indicated. Interim medical history since our last visit reviewed. Allergies and medications reviewed and updated.  Review of Systems  Constitutional:  Negative for chills and fever.  HENT:  Negative for congestion.   Eyes:  Negative for visual disturbance.  Respiratory:  Positive for shortness of breath. Negative for cough and wheezing.   Cardiovascular:  Negative for chest pain and leg swelling.  Musculoskeletal:  Negative for back pain and gait problem.  Skin:  Negative for rash.  Neurological:  Negative for dizziness, weakness and light-headedness.  Psychiatric/Behavioral:  Negative for dysphoric mood, self-injury, sleep disturbance and suicidal ideas. The patient is not nervous/anxious.   All other systems reviewed and are negative.  Per HPI unless specifically indicated above   Allergies as of 12/22/2021       Reactions   Penicillins         Medication List        Accurate as of December 22, 2021 11:28 AM. If you have any questions, ask your nurse or doctor.          albuterol 108 (90 Base) MCG/ACT inhaler Commonly known as: VENTOLIN HFA Inhale 2 puffs into the lungs every 6 (six) hours as needed for wheezing or shortness of  breath.   albuterol (2.5 MG/3ML) 0.083% nebulizer solution Commonly known as: PROVENTIL Take 3 mLs (2.5 mg total) by nebulization every 4 (four) hours as needed for wheezing or shortness of breath.   amLODipine 10 MG tablet Commonly known as: NORVASC Take 1 tablet (10 mg total) by mouth daily.   atorvastatin 80 MG tablet Commonly known as: LIPITOR Take 1 tablet (80 mg total) by mouth daily.   azelastine 0.1 % nasal spray Commonly known as: ASTELIN Place 1 spray into both nostrils 2 (two) times daily. Use in each nostril as directed    bisoprolol 10 MG tablet Commonly known as: ZEBETA Take 2 tablets (20 mg total) by mouth daily. What changed: how much to take Changed by: Worthy Rancher, MD   diclofenac 50 MG EC tablet Commonly known as: VOLTAREN TAKE 1 TABLET BY MOUTH TWICE A DAY   empagliflozin 10 MG Tabs tablet Commonly known as: Jardiance Take 1 tablet (10 mg total) by mouth daily. What changed: how much to take Changed by: Fransisca Kaufmann Oluwadamilola Rosamond, MD   fluticasone 50 MCG/ACT nasal spray Commonly known as: FLONASE Place 2 sprays into both nostrils daily.   furosemide 20 MG tablet Commonly known as: LASIX Take 1 tablet (20 mg total) by mouth daily as needed for fluid or edema.   hydrALAZINE 50 MG tablet Commonly known as: APRESOLINE Take 1 tablet (50 mg total) by mouth 2 (two) times daily.   metFORMIN 1000 MG tablet Commonly known as: GLUCOPHAGE Take 1 tablet (1,000 mg total) by mouth 2 (two) times daily with a meal.   montelukast 10 MG tablet Commonly known as: SINGULAIR Take 1 tablet (10 mg total) by mouth at bedtime.   olmesartan 40 MG tablet Commonly known as: BENICAR Take 1 tablet (40 mg total) by mouth daily.   potassium chloride 10 MEQ CR capsule Commonly known as: MICRO-K Take 1 capsule (10 mEq total) by mouth daily as needed.   sildenafil 20 MG tablet Commonly known as: Revatio 2-5 pills once daily as needed for erectile dysfunction   sulfamethoxazole-trimethoprim 800-160 MG tablet Commonly known as: BACTRIM DS Take 1 tablet by mouth 2 (two) times daily. Started by: Fransisca Kaufmann Madigan Rosensteel, MD   tamsulosin 0.4 MG Caps capsule Commonly known as: FLOMAX Take 0.4 mg by mouth daily.   Trulicity 1.5 SW/1.0XN Sopn Generic drug: Dulaglutide Inject 1.5 mg into the skin once a week.         Objective:   BP (!) 163/85    Pulse 69    Ht 5' 9" (1.753 m)    Wt (!) 393 lb (178.3 kg)    SpO2 95%    BMI 58.04 kg/m   Wt Readings from Last 3 Encounters:  12/22/21 (!) 393 lb (178.3 kg)   11/24/21 (!) 391 lb (177.4 kg)  11/18/21 (!) 392 lb (177.8 kg)    Physical Exam Vitals and nursing note reviewed.  Constitutional:      General: He is not in acute distress.    Appearance: He is well-developed. He is not diaphoretic.  Eyes:     General: No scleral icterus.    Conjunctiva/sclera: Conjunctivae normal.  Neck:     Thyroid: No thyromegaly.  Cardiovascular:     Rate and Rhythm: Normal rate and regular rhythm.     Heart sounds: Normal heart sounds. No murmur heard. Pulmonary:     Effort: Pulmonary effort is normal. No respiratory distress.     Breath sounds: Normal breath sounds. No wheezing.  Musculoskeletal:        General: Normal range of motion.     Cervical back: Neck supple.  Lymphadenopathy:     Cervical: No cervical adenopathy.  Skin:    General: Skin is warm and dry.     Findings: Lesion (Posterior scalp has cystic nodules with purulent drainage out of a couple long and tender and slight erythema in the area.) present. No rash.  Neurological:     Mental Status: He is alert and oriented to person, place, and time.     Coordination: Coordination normal.  Psychiatric:        Behavior: Behavior normal.      Assessment & Plan:   Problem List Items Addressed This Visit       Cardiovascular and Mediastinum   Hypertension associated with diabetes ([place])   Relevant Medications   empagliflozin (JARDIANCE) 10 MG TABS tablet   metFORMIN (GLUCOPHAGE) 1000 MG tablet   bisoprolol (ZEBETA) 10 MG tablet   Other Relevant Orders   CBC with Differential/Platelet   CMP14+EGFR   Lipid panel   PSA, total and free   Bayer DCA Hb A1c Waived     Endocrine   Type 2 diabetes mellitus with other specified complication (HCC) - Primary   Relevant Medications   empagliflozin (JARDIANCE) 10 MG TABS tablet   metFORMIN (GLUCOPHAGE) 1000 MG tablet   Other Relevant Orders   CBC with Differential/Platelet   CMP14+EGFR   Lipid panel   PSA, total and free   Bayer DCA Hb  A1c Waived   Hyperlipidemia associated with type 2 diabetes mellitus (HCC)   Relevant Medications   empagliflozin (JARDIANCE) 10 MG TABS tablet   metFORMIN (GLUCOPHAGE) 1000 MG tablet   bisoprolol (ZEBETA) 10 MG tablet   Other Relevant Orders   CBC with Differential/Platelet   CMP14+EGFR   Lipid panel   PSA, total and free   Bayer DCA Hb A1c Waived   Other Visit Diagnoses     Cellulitis, unspecified cellulitis site       Relevant Medications   sulfamethoxazole-trimethoprim (BACTRIM DS) 800-160 MG tablet       Concerned that patient possibly has obesity hypoventilation syndrome but he denies this.  Also recommended for him to go see a sleep doctor and get a sleep study and he will get that scheduled.  He has already seen pulmonology and cardiology for shortness of breath they have not found a source for it.  He does use albuterol still and will continue with that.  Recommended weight loss but patient is resistant. Follow up plan: Return in about 3 months (around 03/22/2022), or if symptoms worsen or fail to improve, for diabetes and hyperlipidemia.  Counseling provided for all of the vaccine components Orders Placed This Encounter  Procedures   CBC with Differential/Platelet   CMP14+EGFR   Lipid panel   PSA, total and free   Bayer DCA Hb A1c Waived    Caryl Pina, MD [facility] 12/22/2021, 11:28 AM

## 2021-12-23 LAB — CBC WITH DIFFERENTIAL/PLATELET
Basophils Absolute: 0.1 10*3/uL (ref 0.0–0.2)
Basos: 1 %
EOS (ABSOLUTE): 0.3 10*3/uL (ref 0.0–0.4)
Eos: 3 %
Hematocrit: 46.8 % (ref 37.5–51.0)
Hemoglobin: 14.8 g/dL (ref 13.0–17.7)
Immature Grans (Abs): 0 10*3/uL (ref 0.0–0.1)
Immature Granulocytes: 0 %
Lymphocytes Absolute: 1.5 10*3/uL (ref 0.7–3.1)
Lymphs: 15 %
MCH: 24.5 pg — ABNORMAL LOW (ref 26.6–33.0)
MCHC: 31.6 g/dL (ref 31.5–35.7)
MCV: 78 fL — ABNORMAL LOW (ref 79–97)
Monocytes Absolute: 0.5 10*3/uL (ref 0.1–0.9)
Monocytes: 5 %
Neutrophils Absolute: 7.9 10*3/uL — ABNORMAL HIGH (ref 1.4–7.0)
Neutrophils: 76 %
Platelets: 277 10*3/uL (ref 150–450)
RBC: 6.04 x10E6/uL — ABNORMAL HIGH (ref 4.14–5.80)
RDW: 15.7 % — ABNORMAL HIGH (ref 11.6–15.4)
WBC: 10.3 10*3/uL (ref 3.4–10.8)

## 2021-12-23 LAB — CMP14+EGFR
ALT: 39 IU/L (ref 0–44)
AST: 27 IU/L (ref 0–40)
Albumin/Globulin Ratio: 1.7 (ref 1.2–2.2)
Albumin: 4.3 g/dL (ref 3.8–4.9)
Alkaline Phosphatase: 105 IU/L (ref 44–121)
BUN/Creatinine Ratio: 8 — ABNORMAL LOW (ref 9–20)
BUN: 9 mg/dL (ref 6–24)
Bilirubin Total: 0.6 mg/dL (ref 0.0–1.2)
CO2: 24 mmol/L (ref 20–29)
Calcium: 9.6 mg/dL (ref 8.7–10.2)
Chloride: 101 mmol/L (ref 96–106)
Creatinine, Ser: 1.15 mg/dL (ref 0.76–1.27)
Globulin, Total: 2.6 g/dL (ref 1.5–4.5)
Glucose: 150 mg/dL — ABNORMAL HIGH (ref 70–99)
Potassium: 4.3 mmol/L (ref 3.5–5.2)
Sodium: 140 mmol/L (ref 134–144)
Total Protein: 6.9 g/dL (ref 6.0–8.5)
eGFR: 73 mL/min/{1.73_m2} (ref >59)

## 2021-12-23 LAB — PSA, TOTAL AND FREE
PSA, Free Pct: 6.2 %
PSA, Free: 0.55 ng/mL
Prostate Specific Ag, Serum: 8.9 ng/mL — ABNORMAL HIGH (ref 0.0–4.0)

## 2021-12-23 LAB — LIPID PANEL
Chol/HDL Ratio: 4.5 ratio (ref 0.0–5.0)
Cholesterol, Total: 148 mg/dL (ref 100–199)
HDL: 33 mg/dL — ABNORMAL LOW (ref >39)
LDL Chol Calc (NIH): 93 mg/dL (ref 0–99)
Triglycerides: 118 mg/dL (ref 0–149)
VLDL Cholesterol Cal: 22 mg/dL (ref 5–40)

## 2022-01-03 ENCOUNTER — Other Ambulatory Visit: Payer: Self-pay | Admitting: Family Medicine

## 2022-01-28 ENCOUNTER — Other Ambulatory Visit: Payer: Self-pay | Admitting: Family Medicine

## 2022-01-28 DIAGNOSIS — I10 Essential (primary) hypertension: Secondary | ICD-10-CM

## 2022-01-30 ENCOUNTER — Other Ambulatory Visit: Payer: Self-pay | Admitting: Family Medicine

## 2022-01-30 DIAGNOSIS — E1169 Type 2 diabetes mellitus with other specified complication: Secondary | ICD-10-CM

## 2022-02-08 ENCOUNTER — Other Ambulatory Visit: Payer: Self-pay | Admitting: Family Medicine

## 2022-02-08 DIAGNOSIS — R0602 Shortness of breath: Secondary | ICD-10-CM

## 2022-02-08 DIAGNOSIS — E662 Morbid (severe) obesity with alveolar hypoventilation: Secondary | ICD-10-CM

## 2022-02-09 ENCOUNTER — Encounter: Payer: Self-pay | Admitting: Family Medicine

## 2022-02-09 ENCOUNTER — Other Ambulatory Visit: Payer: Self-pay

## 2022-02-09 ENCOUNTER — Emergency Department (HOSPITAL_COMMUNITY): Payer: 59

## 2022-02-09 ENCOUNTER — Inpatient Hospital Stay (HOSPITAL_COMMUNITY)
Admission: EM | Admit: 2022-02-09 | Discharge: 2022-02-11 | DRG: 193 | Disposition: A | Discharge status: Home / Self Care | Payer: 59 | Attending: Internal Medicine | Admitting: Internal Medicine

## 2022-02-09 ENCOUNTER — Ambulatory Visit (INDEPENDENT_AMBULATORY_CARE_PROVIDER_SITE_OTHER): Payer: 59 | Admitting: Family Medicine

## 2022-02-09 ENCOUNTER — Encounter (HOSPITAL_COMMUNITY): Payer: Self-pay | Admitting: Emergency Medicine

## 2022-02-09 ENCOUNTER — Telehealth: Payer: Self-pay | Admitting: Family Medicine

## 2022-02-09 DIAGNOSIS — Z79890 Hormone replacement therapy (postmenopausal): Secondary | ICD-10-CM | POA: Diagnosis not present

## 2022-02-09 DIAGNOSIS — Z87891 Personal history of nicotine dependence: Secondary | ICD-10-CM | POA: Diagnosis not present

## 2022-02-09 DIAGNOSIS — R300 Dysuria: Secondary | ICD-10-CM

## 2022-02-09 DIAGNOSIS — R399 Unspecified symptoms and signs involving the genitourinary system: Secondary | ICD-10-CM

## 2022-02-09 DIAGNOSIS — N3001 Acute cystitis with hematuria: Secondary | ICD-10-CM | POA: Diagnosis not present

## 2022-02-09 DIAGNOSIS — E785 Hyperlipidemia, unspecified: Secondary | ICD-10-CM | POA: Diagnosis present

## 2022-02-09 DIAGNOSIS — G4733 Obstructive sleep apnea (adult) (pediatric): Secondary | ICD-10-CM | POA: Diagnosis present

## 2022-02-09 DIAGNOSIS — Z6841 Body Mass Index (BMI) 40.0 and over, adult: Secondary | ICD-10-CM

## 2022-02-09 DIAGNOSIS — Z880 Allergy status to penicillin: Secondary | ICD-10-CM

## 2022-02-09 DIAGNOSIS — E1159 Type 2 diabetes mellitus with other circulatory complications: Secondary | ICD-10-CM | POA: Diagnosis present

## 2022-02-09 DIAGNOSIS — Z7984 Long term (current) use of oral hypoglycemic drugs: Secondary | ICD-10-CM | POA: Diagnosis not present

## 2022-02-09 DIAGNOSIS — Z7985 Long-term (current) use of injectable non-insulin antidiabetic drugs: Secondary | ICD-10-CM | POA: Diagnosis not present

## 2022-02-09 DIAGNOSIS — N3000 Acute cystitis without hematuria: Secondary | ICD-10-CM | POA: Diagnosis present

## 2022-02-09 DIAGNOSIS — Z79899 Other long term (current) drug therapy: Secondary | ICD-10-CM

## 2022-02-09 DIAGNOSIS — I152 Hypertension secondary to endocrine disorders: Secondary | ICD-10-CM | POA: Diagnosis present

## 2022-02-09 DIAGNOSIS — E1169 Type 2 diabetes mellitus with other specified complication: Secondary | ICD-10-CM | POA: Diagnosis present

## 2022-02-09 DIAGNOSIS — I10 Essential (primary) hypertension: Secondary | ICD-10-CM | POA: Diagnosis present

## 2022-02-09 DIAGNOSIS — Z20822 Contact with and (suspected) exposure to covid-19: Secondary | ICD-10-CM | POA: Diagnosis present

## 2022-02-09 DIAGNOSIS — J189 Pneumonia, unspecified organism: Principal | ICD-10-CM | POA: Diagnosis present

## 2022-02-09 DIAGNOSIS — I890 Lymphedema, not elsewhere classified: Secondary | ICD-10-CM | POA: Diagnosis present

## 2022-02-09 DIAGNOSIS — Z713 Dietary counseling and surveillance: Secondary | ICD-10-CM | POA: Diagnosis not present

## 2022-02-09 DIAGNOSIS — E782 Mixed hyperlipidemia: Secondary | ICD-10-CM | POA: Diagnosis present

## 2022-02-09 DIAGNOSIS — J9601 Acute respiratory failure with hypoxia: Secondary | ICD-10-CM | POA: Diagnosis present

## 2022-02-09 DIAGNOSIS — R609 Edema, unspecified: Secondary | ICD-10-CM

## 2022-02-09 DIAGNOSIS — N390 Urinary tract infection, site not specified: Secondary | ICD-10-CM

## 2022-02-09 LAB — COMPREHENSIVE METABOLIC PANEL
ALT: 15 U/L (ref 0–44)
AST: 16 U/L (ref 15–41)
Albumin: 3.4 g/dL — ABNORMAL LOW (ref 3.5–5.0)
Alkaline Phosphatase: 68 U/L (ref 38–126)
Anion gap: 10 (ref 5–15)
BUN: 16 mg/dL (ref 6–20)
CO2: 22 mmol/L (ref 22–32)
Calcium: 8.7 mg/dL — ABNORMAL LOW (ref 8.9–10.3)
Chloride: 104 mmol/L (ref 98–111)
Creatinine, Ser: 1.21 mg/dL (ref 0.61–1.24)
GFR, Estimated: >60 mL/min (ref >60)
Glucose, Bld: 132 mg/dL — ABNORMAL HIGH (ref 70–99)
Potassium: 3.8 mmol/L (ref 3.5–5.1)
Sodium: 136 mmol/L (ref 135–145)
Total Bilirubin: 1.7 mg/dL — ABNORMAL HIGH (ref 0.3–1.2)
Total Protein: 7.7 g/dL (ref 6.5–8.1)

## 2022-02-09 LAB — BLOOD GAS, ARTERIAL
Acid-Base Excess: 3.1 mmol/L — ABNORMAL HIGH (ref 0.0–2.0)
Bicarbonate: 27 mmol/L (ref 20.0–28.0)
Drawn by: 35043
FIO2: 30 %
O2 Saturation: 96.9 %
Patient temperature: 36.4
pCO2 arterial: 37 mmHg (ref 32–48)
pH, Arterial: 7.47 — ABNORMAL HIGH (ref 7.35–7.45)
pO2, Arterial: 72 mmHg — ABNORMAL LOW (ref 83–108)

## 2022-02-09 LAB — RESP PANEL BY RT-PCR (FLU A&B, COVID) ARPGX2
Influenza A by PCR: NEGATIVE
Influenza B by PCR: NEGATIVE
SARS Coronavirus 2 by RT PCR: NEGATIVE

## 2022-02-09 LAB — CBC WITH DIFFERENTIAL/PLATELET
Abs Immature Granulocytes: 0.1 10*3/uL — ABNORMAL HIGH (ref 0.00–0.07)
Basophils Absolute: 0.1 10*3/uL (ref 0.0–0.1)
Basophils Relative: 0 %
Eosinophils Absolute: 0.2 10*3/uL (ref 0.0–0.5)
Eosinophils Relative: 1 %
HCT: 46 % (ref 39.0–52.0)
Hemoglobin: 14.6 g/dL (ref 13.0–17.0)
Immature Granulocytes: 1 %
Lymphocytes Relative: 6 %
Lymphs Abs: 1.1 10*3/uL (ref 0.7–4.0)
MCH: 24.8 pg — ABNORMAL LOW (ref 26.0–34.0)
MCHC: 31.7 g/dL (ref 30.0–36.0)
MCV: 78.2 fL — ABNORMAL LOW (ref 80.0–100.0)
Monocytes Absolute: 0.8 10*3/uL (ref 0.1–1.0)
Monocytes Relative: 5 %
Neutro Abs: 15 10*3/uL — ABNORMAL HIGH (ref 1.7–7.7)
Neutrophils Relative %: 87 %
Platelets: 329 10*3/uL (ref 150–400)
RBC: 5.88 MIL/uL — ABNORMAL HIGH (ref 4.22–5.81)
RDW: 16.2 % — ABNORMAL HIGH (ref 11.5–15.5)
WBC: 17.2 10*3/uL — ABNORMAL HIGH (ref 4.0–10.5)
nRBC: 0 % (ref 0.0–0.2)

## 2022-02-09 LAB — URINALYSIS, ROUTINE W REFLEX MICROSCOPIC
Bilirubin Urine: NEGATIVE
Glucose, UA: >=500 mg/dL — AB
Ketones, ur: 20 mg/dL — AB
Leukocytes,Ua: NEGATIVE
Nitrite: POSITIVE — AB
Protein, ur: 30 mg/dL — AB
Specific Gravity, Urine: 1.036 — ABNORMAL HIGH (ref 1.005–1.030)
pH: 5 (ref 5.0–8.0)

## 2022-02-09 LAB — D-DIMER, QUANTITATIVE: D-Dimer, Quant: 1.46 ug/mL-FEU — ABNORMAL HIGH (ref 0.00–0.50)

## 2022-02-09 LAB — LACTIC ACID, PLASMA: Lactic Acid, Venous: 1.8 mmol/L (ref 0.5–1.9)

## 2022-02-09 LAB — BRAIN NATRIURETIC PEPTIDE: B Natriuretic Peptide: 60 pg/mL (ref 0.0–100.0)

## 2022-02-09 MED ORDER — LEVOFLOXACIN IN D5W 750 MG/150ML IV SOLN
750.0000 mg | INTRAVENOUS | Status: DC
Start: 2022-02-09 — End: 2022-02-11
  Administered 2022-02-09 – 2022-02-10 (×2): 750 mg via INTRAVENOUS
  Filled 2022-02-09 (×2): qty 150

## 2022-02-09 MED ORDER — SODIUM CHLORIDE 0.9 % IV SOLN: 500.0000 mg | INTRAVENOUS | Status: DC

## 2022-02-09 MED ORDER — IPRATROPIUM-ALBUTEROL 0.5-2.5 (3) MG/3ML IN SOLN
3.0000 mL | Freq: Once | RESPIRATORY_TRACT | Status: AC
  Administered 2022-02-09: 3 mL via RESPIRATORY_TRACT
  Filled 2022-02-09: qty 3

## 2022-02-09 MED ORDER — SODIUM CHLORIDE 0.9 % IV BOLUS
1000.0000 mL | Freq: Once | INTRAVENOUS | Status: AC
  Administered 2022-02-09: 1000 mL via INTRAVENOUS

## 2022-02-09 MED ORDER — ALBUTEROL SULFATE HFA 108 (90 BASE) MCG/ACT IN AERS: 2.0000 | INHALATION_SPRAY | RESPIRATORY_TRACT | Status: DC | PRN

## 2022-02-09 MED ORDER — IOHEXOL 350 MG/ML SOLN
100.0000 mL | Freq: Once | INTRAVENOUS | Status: AC | PRN
Start: 2022-02-09 — End: 2022-02-09
  Administered 2022-02-09: 100 mL via INTRAVENOUS

## 2022-02-09 NOTE — ED Notes (Signed)
Urine specimen cup provided to pt and informed specimen needed, pt verbalized understanding  ?

## 2022-02-09 NOTE — ED Notes (Signed)
Ua obtained at this time and sent over to lab. [place] ?

## 2022-02-09 NOTE — Progress Notes (Signed)
Assessed patient.  Patient is not wheezing, clear and diminished.  No increased WOB and able to complete full sentences. ?

## 2022-02-09 NOTE — ED Provider Notes (Signed)
[facility] Provider Note   CSN: [id number] Arrival date & time: 02/09/22  [id number]     History  Chief Complaint  Patient presents with   Shortness of Breath    Jason HammockMichael L Meyer is a 61 y.o. male.  HPI  Patient with medical history including obesity, diabetes, hypertension, presents with complaints of shortness of breath.  Patient states been feeling short of breath for the last couple days, states it is worsened with exertion, improved with rest, he has no associated chest pain with exertion does not become diaphoretic nausea vomiting lightheaded or dizziness, he denies orthopnea or worsening peripheral edema, he denies any fevers chills cough congestion stomach pains nausea vomiting diarrhea general body aches.  Denies any recent sick contacts, he is not immunocompromise.  Patient endorses that he has been feeling short of breath with exertion for last couple month he was seen by his cardiologist and they state they cannot figure that out.  He has no cardiac history, no history PEs or DVTs currently on hormone therapy, he was a smoker but has quit over 10 years ago, never diagnosed with COPD.  He also notes that he has been having urinary frequency, denies dysuria or hematuria denies any flank tenderness no testicular pain or penile discharge concerned he might have a UTI.  I reviewed patient's chart was evaluated by cardiology, they noted that he has significant atherosclerosis of the LAD and they recommended possible catheterization patient is debating on whether he wants to do this.  He had a normal EF.  Home Medications Prior to Admission medications   Medication Sig Start Date End Date Taking? Authorizing Provider  albuterol (PROVENTIL) (2.5 MG/3ML) 0.083% nebulizer solution TAKE 3 MLS BY NEBULIZATION EVERY 4 HOURS AS NEEDED FOR WHEEZING OR SHORRTNESS OF BREATH Patient taking differently: Take 3 mLs by nebulization every 4 (four) hours as needed for shortness of  breath or wheezing. 02/08/22  Yes Dettinger, Elige RadonJoshua A, MD  albuterol (VENTOLIN HFA) 108 (90 Base) MCG/ACT inhaler Inhale 2 puffs into the lungs every 6 (six) hours as needed for wheezing or shortness of breath. 11/24/21  Yes Dettinger, Elige RadonJoshua A, MD  amLODipine (NORVASC) 10 MG tablet TAKE 1 TABLET BY MOUTH EVERY DAY 01/30/22  Yes Dettinger, Elige RadonJoshua A, MD  atorvastatin (LIPITOR) 80 MG tablet Take 1 tablet (80 mg total) by mouth daily. 09/16/21  Yes Dettinger, Elige RadonJoshua A, MD  azelastine (ASTELIN) 0.1 % nasal spray Place 1 spray into both nostrils 2 (two) times daily. Use in each nostril as directed 11/17/21  Yes Dettinger, Elige RadonJoshua A, MD  bisoprolol (ZEBETA) 10 MG tablet Take 2 tablets (20 mg total) by mouth daily. 12/22/21  Yes Dettinger, Elige RadonJoshua A, MD  diclofenac (VOLTAREN) 50 MG EC tablet TAKE 1 TABLET BY MOUTH TWICE A DAY 01/03/22  Yes Dettinger, Elige RadonJoshua A, MD  Dulaglutide (TRULICITY) 1.5 MG/0.5ML SOPN Inject 1.5 mg into the skin once a week. 01/19/21  Yes Dettinger, Elige RadonJoshua A, MD  empagliflozin (JARDIANCE) 10 MG TABS tablet Take 1 tablet (10 mg total) by mouth daily. 12/22/21  Yes Dettinger, Elige RadonJoshua A, MD  fluticasone (FLONASE) 50 MCG/ACT nasal spray Place 2 sprays into both nostrils daily. 04/02/17  Yes Dettinger, Elige RadonJoshua A, MD  metFORMIN (GLUCOPHAGE) 1000 MG tablet Take 1 tablet (1,000 mg total) by mouth 2 (two) times daily with a meal. 12/22/21  Yes Dettinger, Elige RadonJoshua A, MD  olmesartan (BENICAR) 40 MG tablet Take 1 tablet (40 mg total) by mouth daily. 10/13/21 10/13/22 Yes Sandrea HughsWert, Nayson  B, MD  sildenafil (REVATIO) 20 MG tablet 2-5 pills once daily as needed for erectile dysfunction 07/07/19  Yes Dettinger, Elige Radon, MD  tamsulosin (FLOMAX) 0.4 MG CAPS capsule Take 0.4 mg by mouth daily. 06/21/21  Yes [provider]  furosemide (LASIX) 20 MG tablet Take 1 tablet (20 mg total) by mouth daily as needed for fluid or edema. Patient not taking: Reported on 02/09/2022 09/15/20   Dettinger, Elige Radon, MD  hydrALAZINE  (APRESOLINE) 50 MG tablet Take 1 tablet (50 mg total) by mouth 2 (two) times daily. Patient not taking: Reported on 02/09/2022 09/26/21   Netta Neat., NP  montelukast (SINGULAIR) 10 MG tablet Take 1 tablet (10 mg total) by mouth at bedtime. Patient not taking: Reported on 02/09/2022 11/17/21   Dettinger, Elige Radon, MD  potassium chloride (MICRO-K) 10 MEQ CR capsule Take 1 capsule (10 mEq total) by mouth daily as needed. Patient not taking: Reported on 02/09/2022 09/15/20   Dettinger, Elige Radon, MD  sulfamethoxazole-trimethoprim (BACTRIM DS) 800-160 MG tablet Take 1 tablet by mouth 2 (two) times daily. Patient not taking: Reported on 02/09/2022 12/22/21   Dettinger, Elige Radon, MD      Allergies    Penicillins    Review of Systems   Review of Systems  Constitutional:  Negative for chills and fever.  Respiratory:  Positive for shortness of breath.   Cardiovascular:  Negative for chest pain.  Gastrointestinal:  Negative for abdominal pain.  Genitourinary:  Positive for frequency. Negative for difficulty urinating, dysuria, enuresis and flank pain.  Neurological:  Negative for headaches.   Physical Exam Updated Vital Signs BP 137/77    Pulse 86    Temp 97.6 F (36.4 C) (Oral)    Resp 12    Ht 5\' 9"  (1.753 m)    Wt (!) 178.3 kg    SpO2 93%    BMI 58.04 kg/m  Physical Exam Vitals and nursing note reviewed.  Constitutional:      General: He is not in acute distress.    Appearance: He is not ill-appearing.  HENT:     Head: Normocephalic and atraumatic.     Nose: No congestion.     Mouth/Throat:     Mouth: Mucous membranes are moist.     Pharynx: Oropharynx is clear.  Eyes:     Conjunctiva/sclera: Conjunctivae normal.  Cardiovascular:     Rate and Rhythm: Normal rate and regular rhythm.     Pulses: Normal pulses.     Heart sounds: No murmur heard.   No friction rub. No gallop.  Pulmonary:     Effort: No respiratory distress.     Breath sounds: No wheezing, rhonchi or rales.      Comments: Patient was noted to be hypoxic on arrival placed on 2 L via nasal cannula now in the mid to low 90s, he is able to speak in full sentences, slightly tachypneic, patient noted decreased lung sounds in the lower lobes bilaterally without wheezing rales or rhonchi. Abdominal:     Palpations: Abdomen is soft.     Tenderness: There is no abdominal tenderness. There is no right CVA tenderness or left CVA tenderness.  Musculoskeletal:     Right lower leg: Edema present.     Left lower leg: Edema present.     Comments: 1+ edema up to the shins bilaterally with overlying skin changes good dorsal pedal pulses bilaterally.  Skin:    General: Skin is warm and dry.  Neurological:  Mental Status: He is alert.  Psychiatric:        Mood and Affect: Mood normal.    ED Results / Procedures / Treatments   Labs (all labs ordered are listed, but only abnormal results are displayed) Labs Reviewed  URINALYSIS, ROUTINE W REFLEX MICROSCOPIC - Abnormal; Notable for the following components:      Result Value   Specific Gravity, Urine 1.036 (*)    Glucose, UA >=500 (*)    Hgb urine dipstick SMALL (*)    Ketones, ur 20 (*)    Protein, ur 30 (*)    Nitrite POSITIVE (*)    Bacteria, UA MANY (*)    All other components within normal limits  COMPREHENSIVE METABOLIC PANEL - Abnormal; Notable for the following components:   Glucose, Bld 132 (*)    Calcium 8.7 (*)    Albumin 3.4 (*)    Total Bilirubin 1.7 (*)    All other components within normal limits  CBC WITH DIFFERENTIAL/PLATELET - Abnormal; Notable for the following components:   WBC 17.2 (*)    RBC 5.88 (*)    MCV 78.2 (*)    MCH 24.8 (*)    RDW 16.2 (*)    Neutro Abs 15.0 (*)    Abs Immature Granulocytes 0.10 (*)    All other components within normal limits  BLOOD GAS, ARTERIAL - Abnormal; Notable for the following components:   pH, Arterial 7.47 (*)    pO2, Arterial 72 (*)    Acid-Base Excess 3.1 (*)    All other components  within normal limits  D-DIMER, QUANTITATIVE - Abnormal; Notable for the following components:   D-Dimer, Quant 1.46 (*)    All other components within normal limits  CULTURE, BLOOD (ROUTINE X 2)  CULTURE, BLOOD (ROUTINE X 2)  RESP PANEL BY RT-PCR (FLU A&B, COVID) ARPGX2  URINE CULTURE  BRAIN NATRIURETIC PEPTIDE  LACTIC ACID, PLASMA    EKG None  Radiology DG Chest 2 View  Result Date: 02/09/2022 CLINICAL DATA:  Short of breath for 2 days, hypoxia EXAM: CHEST - 2 VIEW COMPARISON:  11/18/2021 FINDINGS: Frontal and lateral views of the chest demonstrate a stable cardiac silhouette. Chronic vascular congestion without airspace disease, effusion, or pneumothorax. No acute bony abnormalities. IMPRESSION: 1. Chronic vascular congestion without overt edema. Electronically Signed   By: Sharlet SalinaMichael  Brown M.D.   On: 02/09/2022 19:51   CT Angio Chest PE W and/or Wo Contrast  Result Date: 02/09/2022 CLINICAL DATA:  Short of breath for 2 days, hypoxia EXAM: CT ANGIOGRAPHY CHEST WITH CONTRAST TECHNIQUE: Multidetector CT imaging of the chest was performed using the standard protocol during bolus administration of intravenous contrast. Multiplanar CT image reconstructions and MIPs were obtained to evaluate the vascular anatomy. RADIATION DOSE REDUCTION: This exam was performed according to the departmental dose-optimization program which includes automated exposure control, adjustment of the mA and/or kV according to patient size and/or use of iterative reconstruction technique. CONTRAST:  100mL OMNIPAQUE IOHEXOL 350 MG/ML SOLN COMPARISON:  11/18/2021, 02/09/2022 FINDINGS: Cardiovascular: This is a technically adequate evaluation of the pulmonary vasculature. No filling defects or pulmonary emboli. The heart is unremarkable without pericardial effusion. Mild atherosclerosis of the coronary vasculature. No evidence of thoracic aortic aneurysm or dissection. Mediastinum/Nodes: Multiple enlarged mediastinal and hilar  lymph nodes are identified, likely reactive. Largest in the right hilum measures 17 mm in short axis. Thyroid, trachea, and esophagus are unremarkable. Lungs/Pleura: There are trace bilateral pleural effusions. Nodular ground-glass airspace disease is seen  diffusely bilaterally throughout the lungs, compatible with multifocal bilateral atypical pneumonia. Minimal dependent lower lobe atelectasis. No pneumothorax. Central airways are patent. Upper Abdomen: No acute abnormality. Musculoskeletal: No acute or destructive bony lesions. Reconstructed images demonstrate no additional findings. Review of the MIP images confirms the above findings. IMPRESSION: 1. No evidence of pulmonary embolus. 2. Trace bilateral pleural effusions and diffuse ground-glass nodularity throughout the lungs. Favor multifocal atypical infection, including viral etiology. Nonspecific inflammatory change or hypersensitivity reaction could give this appearance. 3. Likely reactive mediastinal adenopathy. 4. Coronary artery atherosclerosis. Electronically Signed   By: Sharlet Salina M.D.   On: 02/09/2022 21:52    Procedures .Critical Care Performed by: Carroll Sage, PA-C Authorized by: Carroll Sage, PA-C   Critical care provider statement:    Critical care time (minutes):  30   Critical care time was exclusive of:  Separately billable procedures and treating other patients   Critical care was time spent personally by me on the following activities:  Discussions with consultants, evaluation of patient's response to treatment, examination of patient, ordering and review of laboratory studies, ordering and review of radiographic studies, ordering and performing treatments and interventions, pulse oximetry, re-evaluation of patient's condition and review of old charts   I assumed direction of critical care for this patient from another provider in my specialty: no     Care discussed with: admitting provider      Medications  Ordered in ED Medications  albuterol (VENTOLIN HFA) 108 (90 Base) MCG/ACT inhaler 2 puff (has no administration in time range)  levofloxacin (LEVAQUIN) IVPB 750 mg (750 mg Intravenous New Bag/Given 02/09/22 2302)  ipratropium-albuterol (DUONEB) 0.5-2.5 (3) MG/3ML nebulizer solution 3 mL (3 mLs Nebulization Given by Other 02/09/22 2046)  iohexol (OMNIPAQUE) 350 MG/ML injection 100 mL (100 mLs Intravenous Contrast Given 02/09/22 2125)  sodium chloride 0.9 % bolus 1,000 mL (1,000 mLs Intravenous New Bag/Given 02/09/22 2248)    ED Course/ Medical Decision Making/ A&P                           Medical Decision Making Amount and/or Complexity of Data Reviewed Labs: ordered. Radiology: ordered.  Risk Prescription drug management.   This patient presents to the ED for concern of shortness of breath, this involves an extensive number of treatment options, and is a complaint that carries with it a high risk of complications and morbidity.  The differential diagnosis includes PE, pneumonia, ACS CHF    Additional history obtained:  Additional history obtained from electronic medical record External records from outside source obtained and reviewed including please see HPI for further detail   Co morbidities that complicate the patient evaluation  Obesity, diabetes, hypertension  Social Determinants of Health:  N/A    Lab Tests:  I Ordered, and personally interpreted labs.  The pertinent results include: CBC shows leukocytosis 17.2, CMP shows glucose of 132 calcium 8.7 albumin 3.4 D-dimer elevated 1.46 blood gas shows pH of 7.47 PO2 of 72 BNP 60   Imaging Studies ordered:  I ordered imaging studies including chest x-ray, CT of chest I independently visualized and interpreted imaging which showed chest x-ray shows chronic vascular congestion, CTA of chest reveals trace bilateral effusions with likely multi focal atypical infection. I agree with the radiologist interpretation   Cardiac  Monitoring:  The patient was maintained on a cardiac monitor.  I personally viewed and interpreted the cardiac monitored which showed an underlying rhythm of: EKG  sinus without signs of ischemia   Medicines ordered and prescription drug management:  I ordered medication including DuoNeb for diminished lung sounds I have reviewed the patients home medicines and have made adjustments as needed  Critical Interventions:  Patient was hypoxic on arrival was placed on 2 L via nasal cannula   Reevaluation:  Presents with shortness of breath with acute hypoxia diminished lung sounds concern for possible PE versus COPD versus pneumonia we will provide DuoNeb and reassess  D-dimer is elevated concerning for possible PE will obtain CTA of chest  Patient was reevaluated after DuoNeb lung sounds have improved and lower lobes still slightly diminished in comparison of the top still no Rales rhonchi or wheezing present.  CT imaging reveals atypical multifocal pneumonia and UA consistent with UTI will start him on Levaquin which will cover him for both.   will also recommend admission patient agreed in this plan will consult hospitalist team  Consultations Obtained:  I requested consultation with the hospitalist Dr. Corinna Gab,  and discussed lab and imaging findings as well as pertinent plan - they recommend: She will admit the patient  Rule out I have low suspicion for ACS as history is atypical, patient has no cardiac history, EKG was sinus rhythm without signs of ischemia, patient had a delta troponin.  Low suspicion for PE or dissection CT imaging is negative for these findings.  I was present for CHF exacerbation as he not volume overloaded on my exam BNP within normal limits.  Low suspicion for systemic infection as patient is nontoxic-appearing, vital signs reassuring, no obvious source infection noted on exam.  I have low suspicion for Pilo or kidney stone as he is having no flank tenderness, no  nausea or vomiting no CVA tenderness.     Dispostion and problem list  After consideration of the diagnostic results and the patients response to treatment, I feel that the patent would benefit from admission.  Acute respiratory failure-likely secondary due to atypical pneumonia started on antibiotics patient will likely need further bronchodilators and monitoring. UTI-started on antibiotics will need further management            Final Clinical Impression(s) / ED Diagnoses Final diagnoses:  Acute respiratory failure with hypoxia (HCC)  Multifocal pneumonia  Acute cystitis without hematuria    Rx / DC Orders ED Discharge Orders     None         Carroll Sage, PA-C 02/09/22 2318    Bethann Berkshire, MD 02/11/22 [phone]

## 2022-02-09 NOTE — ED Triage Notes (Signed)
Pt c/o SOB x 2 days, reports O2 sats at home 84-86%, pt 85% on RA during triage, pt also c/o foul smelling urine with frequency x 2 days  ?

## 2022-02-09 NOTE — Telephone Encounter (Signed)
Pt is scheduled in after hours tonight. He will come by this pm to leave a urine. ? ?Orders placed ?

## 2022-02-09 NOTE — Progress Notes (Signed)
Virtual Visit via telephone Note Due to COVID-19 pandemic this visit was conducted virtually. This visit type was conducted due to national recommendations for restrictions regarding the COVID-19 Pandemic (e.g. social distancing, sheltering in place) in an effort to limit this patient's exposure and mitigate transmission in our community. All issues noted in this document were discussed and addressed.  A physical exam was not performed with this format.   I connected with Jason Meyer on 02/09/2022 at 1320 by telephone and verified that I am speaking with the correct person using two identifiers. Jason Meyer is currently located at home and family is currently with them during visit. The provider, Monia Pouch, FNP is located in their office at time of visit.  I discussed the limitations, risks, security and privacy concerns of performing an evaluation and management service by virtual visit and the availability of in person appointments. I also discussed with the patient that there may be a patient responsible charge related to this service. The patient expressed understanding and agreed to proceed.  Subjective:  Patient ID: Jason Meyer, male    DOB: Sep 17, 1961, 61 y.o.   MRN: [id number]  Chief Complaint:  Dysuria   HPI: Jason Meyer is a 61 y.o. male presenting on 02/09/2022 for Dysuria   Pt reports dysuria, urgency, and frequency for 2 days. Denies fever, chills, flank pain, abdominal pain, fatigue, weakness, or confusion.   Dysuria  This is a new problem. The current episode started in the past 7 days. The quality of the pain is described as aching and burning. The pain is at a severity of 4/10. The pain is mild. There has been no fever. He is Not sexually active. There is No history of pyelonephritis. Associated symptoms include frequency, hesitancy and urgency. Pertinent negatives include no chills, discharge, flank pain, hematuria, nausea, possible pregnancy, sweats or  vomiting. He has tried increased fluids for the symptoms. The treatment provided no relief.    Relevant past medical, surgical, family, and social history reviewed and updated as indicated.  Allergies and medications reviewed and updated.   Past Medical History:  Diagnosis Date   Allergy    Hyperlipidemia    Hypertension    Type 2 diabetes mellitus (HCC)     Past Surgical History:  Procedure Laterality Date   NO PAST SURGERIES      Social History   Socioeconomic History   Marital status: Married    Spouse name: Not on file   Number of children: Not on file   Years of education: Not on file   Highest education level: Not on file  Occupational History   Not on file  Tobacco Use   Smoking status: Former    Packs/day: 1.00    Years: 33.00    Pack years: 33.00    Types: Cigarettes    Quit date: 12/12/2007    Years since quitting: 14.1   Smokeless tobacco: Never  Vaping Use   Vaping Use: Never used  Substance and Sexual Activity   Alcohol use: No   Drug use: No   Sexual activity: Not on file  Other Topics Concern   Not on file  Social History Narrative   Not on file   Social Determinants of Health   Financial Resource Strain: Not on file  Food Insecurity: Not on file  Transportation Needs: Not on file  Physical Activity: Not on file  Stress: Not on file  Social Connections: Not on file  Intimate Partner  Violence: Not on file    Outpatient Encounter Medications as of 02/09/2022  Medication Sig   albuterol (PROVENTIL) (2.5 MG/3ML) 0.083% nebulizer solution TAKE 3 MLS BY NEBULIZATION EVERY 4 HOURS AS NEEDED FOR WHEEZING OR SHORRTNESS OF BREATH   albuterol (VENTOLIN HFA) 108 (90 Base) MCG/ACT inhaler Inhale 2 puffs into the lungs every 6 (six) hours as needed for wheezing or shortness of breath.   amLODipine (NORVASC) 10 MG tablet TAKE 1 TABLET BY MOUTH EVERY DAY   atorvastatin (LIPITOR) 80 MG tablet Take 1 tablet (80 mg total) by mouth daily.   azelastine  (ASTELIN) 0.1 % nasal spray Place 1 spray into both nostrils 2 (two) times daily. Use in each nostril as directed   bisoprolol (ZEBETA) 10 MG tablet Take 2 tablets (20 mg total) by mouth daily.   diclofenac (VOLTAREN) 50 MG EC tablet TAKE 1 TABLET BY MOUTH TWICE A DAY   Dulaglutide (TRULICITY) 1.5 0000000 SOPN Inject 1.5 mg into the skin once a week.   empagliflozin (JARDIANCE) 10 MG TABS tablet Take 1 tablet (10 mg total) by mouth daily.   fluticasone (FLONASE) 50 MCG/ACT nasal spray Place 2 sprays into both nostrils daily.   furosemide (LASIX) 20 MG tablet Take 1 tablet (20 mg total) by mouth daily as needed for fluid or edema.   hydrALAZINE (APRESOLINE) 50 MG tablet Take 1 tablet (50 mg total) by mouth 2 (two) times daily.   metFORMIN (GLUCOPHAGE) 1000 MG tablet Take 1 tablet (1,000 mg total) by mouth 2 (two) times daily with a meal.   montelukast (SINGULAIR) 10 MG tablet Take 1 tablet (10 mg total) by mouth at bedtime.   olmesartan (BENICAR) 40 MG tablet Take 1 tablet (40 mg total) by mouth daily.   potassium chloride (MICRO-K) 10 MEQ CR capsule Take 1 capsule (10 mEq total) by mouth daily as needed.   sildenafil (REVATIO) 20 MG tablet 2-5 pills once daily as needed for erectile dysfunction   sulfamethoxazole-trimethoprim (BACTRIM DS) 800-160 MG tablet Take 1 tablet by mouth 2 (two) times daily.   tamsulosin (FLOMAX) 0.4 MG CAPS capsule Take 0.4 mg by mouth daily.   No facility-administered encounter medications on file as of 02/09/2022.    Allergies  Allergen Reactions   Penicillins     Review of Systems  Constitutional:  Positive for activity change. Negative for appetite change, chills, diaphoresis, fatigue, fever and unexpected weight change.  Respiratory:  Negative for cough and shortness of breath.   Cardiovascular:  Negative for chest pain, palpitations and leg swelling.  Gastrointestinal:  Negative for abdominal pain, nausea and vomiting.  Genitourinary:  Positive for dysuria,  frequency, hesitancy and urgency. Negative for decreased urine volume, difficulty urinating, enuresis, flank pain, genital sores, hematuria, penile discharge, penile pain, penile swelling, scrotal swelling and testicular pain.  Musculoskeletal:  Negative for back pain.  Neurological:  Negative for weakness.  Psychiatric/Behavioral:  Negative for confusion.   All other systems reviewed and are negative.       Observations/Objective: No vital signs or physical exam, this was a virtual health encounter.  Pt alert and oriented, answers all questions appropriately, and able to speak in full sentences.    Assessment and Plan: Jason Meyer was seen today for dysuria.  Diagnoses and all orders for this visit:  Dysuria Pt with UTI symptoms. No reported symptoms concerning for acute pyelonephritis. Pt will come to office and provide urine sample. Further treatment pending results. Pt aware if symptoms worsen, he needs to be seen  in person. Aware if urinalysis is negative, he will need to be seen in person for further evaluation. Aware of red flags which require emergent evaluation.  -     Urinalysis, Routine w reflex microscopic     Follow Up Instructions: Return if symptoms worsen or fail to improve.    I discussed the assessment and treatment plan with the patient. The patient was provided an opportunity to ask questions and all were answered. The patient agreed with the plan and demonstrated an understanding of the instructions.   The patient was advised to call back or seek an in-person evaluation if the symptoms worsen or if the condition fails to improve as anticipated.  The above assessment and management plan was discussed with the patient. The patient verbalized understanding of and has agreed to the management plan. Patient is aware to call the clinic if they develop any new symptoms or if symptoms persist or worsen. Patient is aware when to return to the clinic for a follow-up visit.  Patient educated on when it is appropriate to go to the emergency department.    I provided 15 minutes of time during this telephone encounter.   Monia Pouch, FNP-C [facility] [address] [place], [place] [address] [phone] 02/09/2022

## 2022-02-10 DIAGNOSIS — E1169 Type 2 diabetes mellitus with other specified complication: Secondary | ICD-10-CM

## 2022-02-10 DIAGNOSIS — J189 Pneumonia, unspecified organism: Secondary | ICD-10-CM

## 2022-02-10 DIAGNOSIS — I152 Hypertension secondary to endocrine disorders: Secondary | ICD-10-CM

## 2022-02-10 DIAGNOSIS — E785 Hyperlipidemia, unspecified: Secondary | ICD-10-CM

## 2022-02-10 DIAGNOSIS — E1159 Type 2 diabetes mellitus with other circulatory complications: Secondary | ICD-10-CM

## 2022-02-10 DIAGNOSIS — N3001 Acute cystitis with hematuria: Secondary | ICD-10-CM

## 2022-02-10 DIAGNOSIS — J9601 Acute respiratory failure with hypoxia: Secondary | ICD-10-CM

## 2022-02-10 DIAGNOSIS — N3000 Acute cystitis without hematuria: Secondary | ICD-10-CM

## 2022-02-10 DIAGNOSIS — N390 Urinary tract infection, site not specified: Secondary | ICD-10-CM

## 2022-02-10 LAB — COMPREHENSIVE METABOLIC PANEL
ALT: 15 U/L (ref 0–44)
AST: 13 U/L — ABNORMAL LOW (ref 15–41)
Albumin: 3.4 g/dL — ABNORMAL LOW (ref 3.5–5.0)
Alkaline Phosphatase: 67 U/L (ref 38–126)
Anion gap: 13 (ref 5–15)
BUN: 16 mg/dL (ref 6–20)
CO2: 20 mmol/L — ABNORMAL LOW (ref 22–32)
Calcium: 8.7 mg/dL — ABNORMAL LOW (ref 8.9–10.3)
Chloride: 103 mmol/L (ref 98–111)
Creatinine, Ser: 1.24 mg/dL (ref 0.61–1.24)
GFR, Estimated: >60 mL/min (ref >60)
Glucose, Bld: 129 mg/dL — ABNORMAL HIGH (ref 70–99)
Potassium: 3.9 mmol/L (ref 3.5–5.1)
Sodium: 136 mmol/L (ref 135–145)
Total Bilirubin: 1.7 mg/dL — ABNORMAL HIGH (ref 0.3–1.2)
Total Protein: 7.6 g/dL (ref 6.5–8.1)

## 2022-02-10 LAB — GLUCOSE, CAPILLARY
Glucose-Capillary: 165 mg/dL — ABNORMAL HIGH (ref 70–99)
Glucose-Capillary: 114 mg/dL — ABNORMAL HIGH (ref 70–99)
Glucose-Capillary: 134 mg/dL — ABNORMAL HIGH (ref 70–99)
Glucose-Capillary: 175 mg/dL — ABNORMAL HIGH (ref 70–99)
Glucose-Capillary: 192 mg/dL — ABNORMAL HIGH (ref 70–99)
Glucose-Capillary: 230 mg/dL — ABNORMAL HIGH (ref 70–99)

## 2022-02-10 LAB — CBC WITH DIFFERENTIAL/PLATELET
Abs Immature Granulocytes: 0.12 10*3/uL — ABNORMAL HIGH (ref 0.00–0.07)
Basophils Absolute: 0 10*3/uL (ref 0.0–0.1)
Basophils Relative: 0 %
Eosinophils Absolute: 0.1 10*3/uL (ref 0.0–0.5)
Eosinophils Relative: 1 %
HCT: 45.8 % (ref 39.0–52.0)
Hemoglobin: 13.9 g/dL (ref 13.0–17.0)
Immature Granulocytes: 1 %
Lymphocytes Relative: 5 %
Lymphs Abs: 0.9 10*3/uL (ref 0.7–4.0)
MCH: 23.9 pg — ABNORMAL LOW (ref 26.0–34.0)
MCHC: 30.3 g/dL (ref 30.0–36.0)
MCV: 78.8 fL — ABNORMAL LOW (ref 80.0–100.0)
Monocytes Absolute: 0.8 10*3/uL (ref 0.1–1.0)
Monocytes Relative: 5 %
Neutro Abs: 14.9 10*3/uL — ABNORMAL HIGH (ref 1.7–7.7)
Neutrophils Relative %: 88 %
Platelets: 329 10*3/uL (ref 150–400)
RBC: 5.81 MIL/uL (ref 4.22–5.81)
RDW: 16.4 % — ABNORMAL HIGH (ref 11.5–15.5)
WBC: 16.9 10*3/uL — ABNORMAL HIGH (ref 4.0–10.5)
nRBC: 0 % (ref 0.0–0.2)

## 2022-02-10 LAB — HIV ANTIBODY (ROUTINE TESTING W REFLEX): HIV Screen 4th Generation wRfx: NONREACTIVE

## 2022-02-10 LAB — HEMOGLOBIN A1C
Hgb A1c MFr Bld: 6.2 % — ABNORMAL HIGH (ref 4.8–5.6)
Mean Plasma Glucose: 131.24 mg/dL

## 2022-02-10 LAB — MAGNESIUM: Magnesium: 2 mg/dL (ref 1.7–2.4)

## 2022-02-10 LAB — STREP PNEUMONIAE URINARY ANTIGEN: Strep Pneumo Urinary Antigen: NEGATIVE

## 2022-02-10 MED ORDER — MONTELUKAST SODIUM 10 MG PO TABS
10.0000 mg | ORAL_TABLET | Freq: Every day | ORAL | Status: DC
  Administered 2022-02-10 (×2): 10 mg via ORAL
  Filled 2022-02-10 (×2): qty 1

## 2022-02-10 MED ORDER — AMLODIPINE BESYLATE 5 MG PO TABS
10.0000 mg | ORAL_TABLET | Freq: Every day | ORAL | Status: DC
  Administered 2022-02-10 – 2022-02-11 (×2): 10 mg via ORAL
  Filled 2022-02-10 (×2): qty 2

## 2022-02-10 MED ORDER — FLUTICASONE FUROATE-VILANTEROL 100-25 MCG/ACT IN AEPB
1.0000 | INHALATION_SPRAY | Freq: Every day | RESPIRATORY_TRACT | Status: DC
  Administered 2022-02-10 – 2022-02-11 (×2): 1 via RESPIRATORY_TRACT
  Filled 2022-02-10: qty 28

## 2022-02-10 MED ORDER — METHYLPREDNISOLONE SODIUM SUCC 40 MG IJ SOLR
40.0000 mg | Freq: Two times a day (BID) | INTRAMUSCULAR | Status: AC
  Administered 2022-02-10 (×2): 40 mg via INTRAVENOUS
  Filled 2022-02-10 (×2): qty 1

## 2022-02-10 MED ORDER — IPRATROPIUM-ALBUTEROL 0.5-2.5 (3) MG/3ML IN SOLN
3.0000 mL | Freq: Three times a day (TID) | RESPIRATORY_TRACT | Status: DC
  Administered 2022-02-11 (×2): 3 mL via RESPIRATORY_TRACT
  Filled 2022-02-10 (×2): qty 3

## 2022-02-10 MED ORDER — PREDNISONE 20 MG PO TABS
40.0000 mg | ORAL_TABLET | Freq: Every day | ORAL | Status: DC
  Administered 2022-02-11: 40 mg via ORAL
  Filled 2022-02-10: qty 2

## 2022-02-10 MED ORDER — ACETAMINOPHEN 325 MG PO TABS: 650.0000 mg | ORAL_TABLET | Freq: Four times a day (QID) | ORAL | Status: DC | PRN

## 2022-02-10 MED ORDER — TAMSULOSIN HCL 0.4 MG PO CAPS
0.4000 mg | ORAL_CAPSULE | Freq: Every day | ORAL | Status: DC
  Administered 2022-02-10 – 2022-02-11 (×2): 0.4 mg via ORAL
  Filled 2022-02-10 (×2): qty 1

## 2022-02-10 MED ORDER — ONDANSETRON HCL 4 MG/2ML IJ SOLN: 4.0000 mg | Freq: Four times a day (QID) | INTRAMUSCULAR | Status: DC | PRN

## 2022-02-10 MED ORDER — FLUTICASONE PROPIONATE 50 MCG/ACT NA SUSP
2.0000 | Freq: Every day | NASAL | Status: DC
  Filled 2022-02-10 (×2): qty 16

## 2022-02-10 MED ORDER — OXYCODONE HCL 5 MG PO TABS: 5.0000 mg | ORAL_TABLET | ORAL | Status: DC | PRN

## 2022-02-10 MED ORDER — IPRATROPIUM-ALBUTEROL 0.5-2.5 (3) MG/3ML IN SOLN
3.0000 mL | Freq: Four times a day (QID) | RESPIRATORY_TRACT | Status: DC
  Administered 2022-02-10 (×4): 3 mL via RESPIRATORY_TRACT
  Filled 2022-02-10 (×4): qty 3

## 2022-02-10 MED ORDER — ATORVASTATIN CALCIUM 40 MG PO TABS
80.0000 mg | ORAL_TABLET | Freq: Every day | ORAL | Status: DC
  Administered 2022-02-10 – 2022-02-11 (×2): 80 mg via ORAL
  Filled 2022-02-10 (×2): qty 2

## 2022-02-10 MED ORDER — IRBESARTAN 150 MG PO TABS
300.0000 mg | ORAL_TABLET | Freq: Every day | ORAL | Status: DC
Start: 2022-02-10 — End: 2022-02-11
  Administered 2022-02-10 – 2022-02-11 (×2): 300 mg via ORAL
  Filled 2022-02-10 (×2): qty 2

## 2022-02-10 MED ORDER — BISOPROLOL FUMARATE 5 MG PO TABS
20.0000 mg | ORAL_TABLET | Freq: Every day | ORAL | Status: DC
  Administered 2022-02-10 – 2022-02-11 (×2): 20 mg via ORAL
  Filled 2022-02-10 (×2): qty 4

## 2022-02-10 MED ORDER — INSULIN ASPART 100 UNIT/ML IJ SOLN
0.0000 [IU] | Freq: Every day | INTRAMUSCULAR | Status: DC
  Administered 2022-02-10: 2 [IU] via SUBCUTANEOUS

## 2022-02-10 MED ORDER — HEPARIN SODIUM (PORCINE) 5000 UNIT/ML IJ SOLN
5000.0000 [IU] | Freq: Three times a day (TID) | INTRAMUSCULAR | Status: DC
  Administered 2022-02-10 (×3): 5000 [IU] via SUBCUTANEOUS
  Filled 2022-02-10 (×3): qty 1

## 2022-02-10 MED ORDER — INSULIN ASPART 100 UNIT/ML IJ SOLN
0.0000 [IU] | Freq: Three times a day (TID) | INTRAMUSCULAR | Status: DC
  Administered 2022-02-10: 2 [IU] via SUBCUTANEOUS
  Administered 2022-02-10: 3 [IU] via SUBCUTANEOUS
  Administered 2022-02-11: 5 [IU] via SUBCUTANEOUS
  Administered 2022-02-11: 3 [IU] via SUBCUTANEOUS

## 2022-02-10 MED ORDER — ONDANSETRON HCL 4 MG PO TABS: 4.0000 mg | ORAL_TABLET | Freq: Four times a day (QID) | ORAL | Status: DC | PRN

## 2022-02-10 MED ORDER — MORPHINE SULFATE (PF) 2 MG/ML IV SOLN: 2.0000 mg | INTRAVENOUS | Status: DC | PRN

## 2022-02-10 MED ORDER — ACETAMINOPHEN 650 MG RE SUPP: 650.0000 mg | Freq: Four times a day (QID) | RECTAL | Status: DC | PRN

## 2022-02-10 NOTE — Assessment & Plan Note (Signed)
Continue Levaquin ?Blood cultures pending ?Urine culture pending ?

## 2022-02-10 NOTE — Assessment & Plan Note (Signed)
Leukocytosis 17.2, multifocal pneumonia finding on CTA, COVID-negative ?Cultures pending ?Continue Levaquin ?

## 2022-02-10 NOTE — Assessment & Plan Note (Signed)
Continue Norvasc, Zebeta, irbesartan ?Continue to monitor ?

## 2022-02-10 NOTE — Assessment & Plan Note (Signed)
Hold home oral hypoglycemics ?Sliding scale coverage ?Carb modified diet ?Continue to monitor ?

## 2022-02-10 NOTE — Assessment & Plan Note (Signed)
-   Continue statin medication  

## 2022-02-10 NOTE — Progress Notes (Signed)
Tele report that pt ran 9 runs of v rach. Pt sitting on bed looking in the direction of the tv.. pt denies pain or discomfort. Will continue to monitor. Call bell in reach. ?

## 2022-02-10 NOTE — Assessment & Plan Note (Signed)
Patient's BMI is 58 ?Extensive counseling regarding exercise and diet ?Patient verbalizes understanding ?

## 2022-02-10 NOTE — TOC Progression Note (Signed)
?  Transition of Care (TOC) Screening Note ? ? ?Patient Details  ?Name: Jason Meyer ?Date of Birth: [birth year] ? ? ?Transition of Care (TOC) CM/SW Contact:    ?Leitha Bleak, RN ?Phone Number: ?02/10/2022, 12:13 PM ? ? ? ?Transition of Care Department [facility]) has reviewed patient and no TOC needs have been identified at this time. We will continue to monitor patient advancement through interdisciplinary progression rounds. If new patient transition needs arise, please place a TOC consult. ? ? ? ?  ?Barriers to Discharge: No Barriers Identified ? ? ?  ?

## 2022-02-10 NOTE — H&P (Signed)
History and Physical    Patient: Jason Meyer UXN:[id number] DOB: [birth year] DOA: 02/09/2022 DOS: the patient was seen and examined on 02/10/2022 PCP: Dettinger, Elige Radon, MD  Patient coming from: Home  Chief Complaint:  Chief Complaint  Patient presents with   Shortness of Breath    HPI: Jason Meyer is a 61 y.o. male with medical history significant of with history of hyperlipidemia, hypertension, type 2 diabetes mellitus, presents the ED with a chief complaint of urinary frequency.  Patient reports he has had urinary frequency, and malodorous urine for 3 days.  He has had chills but no measured fever.  He took Alka-Seltzer and it helped with the chills.  He is also had shortness of breath is worse with exertion.  Urine symptoms started 3 days ago, the respiratory symptoms started 2 days ago.  He has an associated cough.  Patient reports not being able to sleep last 2-3 nights because he feels like he cannot breathe.  Patient denies any palpitations or chest pain.  Patient also denies hematuria and dysuria.  Patient declines CPAP at this time.  Patient is a former smoker but reports quitting 30 years ago.  He does not drink alcohol and does not use illicit drugs.  Patient is vaccinated for COVID.  Patient is full code.  Of note, patient is not taking his Lasix.  Significant edema and stasis changes of the lower extremities.  Patient reports he does not know why he is not taking his Lasix.  Advised on the importance of compliance with medical regimen.   Review of Systems: As mentioned in the history of present illness. All other systems reviewed and are negative. Past Medical History:  Diagnosis Date   Allergy    Hyperlipidemia    Hypertension    Type 2 diabetes mellitus (HCC)    Past Surgical History:  Procedure Laterality Date   NO PAST SURGERIES     Social History:  reports that he quit smoking about 14 years ago. His smoking use included cigarettes. He has a 33.00 pack-year  smoking history. He has never used smokeless tobacco. He reports that he does not drink alcohol and does not use drugs.  Allergies  Allergen Reactions   Penicillins     Family History  Problem Relation Age of Onset   Heart attack Brother    Alzheimer's disease Mother    Heart attack Father    Stroke Father     Prior to Admission medications   Medication Sig Start Date End Date Taking? Authorizing Provider  albuterol (PROVENTIL) (2.5 MG/3ML) 0.083% nebulizer solution TAKE 3 MLS BY NEBULIZATION EVERY 4 HOURS AS NEEDED FOR WHEEZING OR SHORRTNESS OF BREATH Patient taking differently: Take 3 mLs by nebulization every 4 (four) hours as needed for shortness of breath or wheezing. 02/08/22  Yes Dettinger, Elige Radon, MD  albuterol (VENTOLIN HFA) 108 (90 Base) MCG/ACT inhaler Inhale 2 puffs into the lungs every 6 (six) hours as needed for wheezing or shortness of breath. 11/24/21  Yes Dettinger, Elige Radon, MD  amLODipine (NORVASC) 10 MG tablet TAKE 1 TABLET BY MOUTH EVERY DAY 01/30/22  Yes Dettinger, Elige Radon, MD  atorvastatin (LIPITOR) 80 MG tablet Take 1 tablet (80 mg total) by mouth daily. 09/16/21  Yes Dettinger, Elige Radon, MD  azelastine (ASTELIN) 0.1 % nasal spray Place 1 spray into both nostrils 2 (two) times daily. Use in each nostril as directed 11/17/21  Yes Dettinger, Elige Radon, MD  bisoprolol (ZEBETA) 10 MG tablet  Take 2 tablets (20 mg total) by mouth daily. 12/22/21  Yes Dettinger, Elige Radon, MD  diclofenac (VOLTAREN) 50 MG EC tablet TAKE 1 TABLET BY MOUTH TWICE A DAY 01/03/22  Yes Dettinger, Elige Radon, MD  Dulaglutide (TRULICITY) 1.5 MG/0.5ML SOPN Inject 1.5 mg into the skin once a week. 01/19/21  Yes Dettinger, Elige Radon, MD  empagliflozin (JARDIANCE) 10 MG TABS tablet Take 1 tablet (10 mg total) by mouth daily. 12/22/21  Yes Dettinger, Elige Radon, MD  fluticasone (FLONASE) 50 MCG/ACT nasal spray Place 2 sprays into both nostrils daily. 04/02/17  Yes Dettinger, Elige Radon, MD  metFORMIN (GLUCOPHAGE) 1000  MG tablet Take 1 tablet (1,000 mg total) by mouth 2 (two) times daily with a meal. 12/22/21  Yes Dettinger, Elige Radon, MD  olmesartan (BENICAR) 40 MG tablet Take 1 tablet (40 mg total) by mouth daily. 10/13/21 10/13/22 Yes Nyoka Cowden, MD  sildenafil (REVATIO) 20 MG tablet 2-5 pills once daily as needed for erectile dysfunction 07/07/19  Yes Dettinger, Elige Radon, MD  tamsulosin (FLOMAX) 0.4 MG CAPS capsule Take 0.4 mg by mouth daily. 06/21/21  Yes [provider]  furosemide (LASIX) 20 MG tablet Take 1 tablet (20 mg total) by mouth daily as needed for fluid or edema. Patient not taking: Reported on 02/09/2022 09/15/20   Dettinger, Elige Radon, MD  hydrALAZINE (APRESOLINE) 50 MG tablet Take 1 tablet (50 mg total) by mouth 2 (two) times daily. Patient not taking: Reported on 02/09/2022 09/26/21   Netta Neat., NP  montelukast (SINGULAIR) 10 MG tablet Take 1 tablet (10 mg total) by mouth at bedtime. Patient not taking: Reported on 02/09/2022 11/17/21   Dettinger, Elige Radon, MD  potassium chloride (MICRO-K) 10 MEQ CR capsule Take 1 capsule (10 mEq total) by mouth daily as needed. Patient not taking: Reported on 02/09/2022 09/15/20   Dettinger, Elige Radon, MD  sulfamethoxazole-trimethoprim (BACTRIM DS) 800-160 MG tablet Take 1 tablet by mouth 2 (two) times daily. Patient not taking: Reported on 02/09/2022 12/22/21   Dettinger, Elige Radon, MD    Physical Exam: Vitals:   02/09/22 2245 02/09/22 2300 02/10/22 0130 02/10/22 0203  BP: 131/68 137/77 135/66   Pulse: 87 86 92   Resp: 19 12 (!) 27   Temp:      TempSrc:      SpO2: 93% 93% 92% 95%  Weight:      Height:       1.  General: Patient lying supine in bed, head of bed elevated, no acute distress   2. Psychiatric: Alert and oriented x 3, mood and behavior normal for situation, pleasant and cooperative with exam   3. Neurologic: Speech and language are normal, face is symmetric, moves all 4 extremities voluntarily, at baseline without acute deficits  on limited exam   4. HEENMT:  Head is atraumatic, normocephalic, pupils reactive to light, neck is supple, trachea is midline, mucous membranes are moist   5. Respiratory : Lungs are diminished in the lower lung fields but otherwise clear to auscultation bilaterally without wheezing, rhonchi, rales, no cyanosis, no increase in work of breathing or accessory muscle use   6. Cardiovascular : Heart rate normal, rhythm is regular, no murmurs, rubs or gallops, peripheral edema present, venous stasis changes, peripheral pulses palpated   7. Gastrointestinal:  Abdomen is soft, nondistended, nontender to palpation bowel sounds active, no masses or organomegaly palpated   8. Skin:  Skin is warm, dry and intact without rashes, acute lesions, or ulcers  on limited exam   9.Musculoskeletal:  No acute deformities or trauma, no asymmetry in tone, peripheral edema present, venous stasis changes, no tenderness to palpation in the extremities   Data Reviewed: In the ED Temp 97.6, heart rate 80-87, respiratory rate 24, blood pressure 130/79, satting 88-99% Leukocytosis 17.2, hemoglobin 14.6, platelets 329 Chemistry panel reveals a slightly elevated creatinine 1.21, very close to baseline pH 7.47, CO2 37, O2 72 on VBG Albumin 3.4 Total bili 1.7 D-dimer 1.46 Blood cultures pending CTA shows no evidence of pulmonary embolus.  Trace bilateral pleural effusions and diffuse groundglass nodularity throughout the lungs that favor multifocal atypical infection including viral etiology Albuterol, Levaquin, DuoNeb, normal saline started in the ED UA does show nitrites proteinuria glucosuria small hematuria, and 21-50 white blood cells Levaquin chosen as patient has an allergy to penicillins and Levaquin can cover both urine and respiratory pathogens   Assessment and Plan: * Acute respiratory failure with hypoxia (HCC) Patient's oxygen sats dropping to 85% in triage Normalized on 2 L nasal cannula CTA  reveals no PE CTA reveals multifocal pneumonia COVID-negative Legionella, strep antigens pending Expectorated sputum culture pending Blood cultures pending Continue Levaquin Lactic acid 1.8 Wean off O2 as tolerated  CAP (community acquired pneumonia) Leukocytosis 17.2, multifocal pneumonia finding on CTA, COVID-negative Cultures pending Continue Levaquin  UTI (urinary tract infection) Continue Levaquin Blood cultures pending Urine culture pending  Hyperlipidemia associated with type 2 diabetes mellitus (HCC) Continue statin medication  Morbid obesity (HCC) Patient's BMI is 58 Extensive counseling regarding exercise and diet Patient verbalizes understanding  Hypertension associated with diabetes (HCC) Continue Norvasc, Zebeta, irbesartan Continue to monitor  Type 2 diabetes mellitus with other specified complication (HCC) Hold home oral hypoglycemics Sliding scale coverage Carb modified diet Continue to monitor       Advance Care Planning:   Code Status: Full Code   Consults: None  Family Communication: Son at bedside  Severity of Illness: The appropriate patient status for this patient is INPATIENT. Inpatient status is judged to be reasonable and necessary in order to provide the required intensity of service to ensure the patient's safety. The patient's presenting symptoms, physical exam findings, and initial radiographic and laboratory data in the context of their chronic comorbidities is felt to place them at high risk for further clinical deterioration. Furthermore, it is not anticipated that the patient will be medically stable for discharge from the hospital within 2 midnights of admission.   * I certify that at the point of admission it is my clinical judgment that the patient will require inpatient hospital care spanning beyond 2 midnights from the point of admission due to high intensity of service, high risk for further deterioration and high frequency of  surveillance required.*  Author: Lilyan Gilford, DO 02/10/2022 2:24 AM  For on call review [email or web address].

## 2022-02-10 NOTE — Progress Notes (Signed)
Patient admitted to the hospital earlier this morning by Drs. Carren Rang ? ?Patient seen and examined.  Overall he feels that his breathing is improving.  Does not have any significant cough.  He did ambulate on room air and became hypoxic. ? ?He has been admitted to the hospital with acute respiratory failure with hypoxia, community-acquired pneumonia and possible urinary tract infection.  Cultures are in progress.  He is on intravenous antibiotics.  Continue current treatments for now.  We will reassess his oxygen requirements in a.m.  If he is improving, can consider discharge home tomorrow. ? ?Jason Meyer ? ?

## 2022-02-10 NOTE — Progress Notes (Incomplete)
Tele called with update that pt ran 9 runs of v teach.. checked on pt .. sitting on bed looking in the direction of the tv.. pt denies any chest pain or discomfort. Will continue to monitor. Call bel in reach ?

## 2022-02-10 NOTE — Assessment & Plan Note (Addendum)
Patient's oxygen sats dropping to 85% in triage ?Normalized on 2 L nasal cannula ?CTA reveals no PE ?CTA reveals multifocal pneumonia ?COVID-negative ?Legionella, strep antigens pending ?Expectorated sputum culture pending ?Blood cultures pending ?Continue Levaquin ?Lactic acid 1.8 ?Wean off O2 as tolerated ?

## 2022-02-11 LAB — CBC
HCT: 45.5 % (ref 39.0–52.0)
Hemoglobin: 13.9 g/dL (ref 13.0–17.0)
MCH: 24 pg — ABNORMAL LOW (ref 26.0–34.0)
MCHC: 30.5 g/dL (ref 30.0–36.0)
MCV: 78.4 fL — ABNORMAL LOW (ref 80.0–100.0)
Platelets: 380 10*3/uL (ref 150–400)
RBC: 5.8 MIL/uL (ref 4.22–5.81)
RDW: 16.6 % — ABNORMAL HIGH (ref 11.5–15.5)
WBC: 17.1 10*3/uL — ABNORMAL HIGH (ref 4.0–10.5)
nRBC: 0 % (ref 0.0–0.2)

## 2022-02-11 LAB — COMPREHENSIVE METABOLIC PANEL
ALT: 17 U/L (ref 0–44)
AST: 14 U/L — ABNORMAL LOW (ref 15–41)
Albumin: 3.4 g/dL — ABNORMAL LOW (ref 3.5–5.0)
Alkaline Phosphatase: 67 U/L (ref 38–126)
Anion gap: 11 (ref 5–15)
BUN: 26 mg/dL — ABNORMAL HIGH (ref 6–20)
CO2: 23 mmol/L (ref 22–32)
Calcium: 9.1 mg/dL (ref 8.9–10.3)
Chloride: 103 mmol/L (ref 98–111)
Creatinine, Ser: 1.23 mg/dL (ref 0.61–1.24)
GFR, Estimated: >60 mL/min (ref >60)
Glucose, Bld: 181 mg/dL — ABNORMAL HIGH (ref 70–99)
Potassium: 4.3 mmol/L (ref 3.5–5.1)
Sodium: 137 mmol/L (ref 135–145)
Total Bilirubin: 0.7 mg/dL (ref 0.3–1.2)
Total Protein: 8.1 g/dL (ref 6.5–8.1)

## 2022-02-11 LAB — GLUCOSE, CAPILLARY
Glucose-Capillary: 163 mg/dL — ABNORMAL HIGH (ref 70–99)
Glucose-Capillary: 204 mg/dL — ABNORMAL HIGH (ref 70–99)

## 2022-02-11 LAB — URINE CULTURE: Culture: NO GROWTH

## 2022-02-11 MED ORDER — FUROSEMIDE 20 MG PO TABS: 20.0000 mg | ORAL_TABLET | Freq: Every day | ORAL | 3 refills | Status: DC | PRN

## 2022-02-11 MED ORDER — LEVOFLOXACIN 750 MG PO TABS: 750.0000 mg | ORAL_TABLET | Freq: Every day | ORAL | 0 refills | Status: AC

## 2022-02-11 MED ORDER — POTASSIUM CHLORIDE ER 10 MEQ PO CPCR: 10.0000 meq | ORAL_CAPSULE | Freq: Every day | ORAL | 3 refills | Status: DC | PRN

## 2022-02-11 MED ORDER — GUAIFENESIN ER 600 MG PO TB12: 600.0000 mg | ORAL_TABLET | Freq: Two times a day (BID) | ORAL | 2 refills | Status: AC

## 2022-02-11 NOTE — Discharge Summary (Signed)
Physician Discharge Summary  Jason Meyer Holley XBM:[id number] DOB: 21-Aug-1961 DOA: 02/09/2022  PCP: Dettinger, Elige Radon, MD  Admit date: 02/09/2022 Discharge date: 02/11/2022  Admitted From: Home Disposition: Home  Recommendations for Outpatient Follow-up:  Follow up with PCP in 1-2 weeks Please obtain BMP/CBC in one week Patient will be obtaining his CPAP for sleep apnea in the coming days.   He has been advised to follow-up with cardiology to discuss possible indications for cardiac cath He will need repeat chest x-ray in the next 4 weeks to ensure resolution of pneumonia He will monitor his oxygen saturations at home, currently he has transient hypoxia on ambulation but per patient and review of records this appears to be his baseline/chronic issue He has been referred to outpatient physical therapy for management of lymphedema   Home Health: Equipment/Devices:  Discharge Condition: Stable CODE STATUS: Full code Diet recommendation: Heart healthy, carb modified  Brief/Interim Summary: 61 year old male with a history of hypertension, hyperlipidemia, diabetes, morbid obesity, obstructive sleep apnea, presents to the hospital with shortness of breath, cough.  Imaging of his chest indicated possible pneumonia.  Urinalysis indicated possible UTI.  He was started on levofloxacin.  Overall he does feel improved.  Feels that his breathing is currently back to baseline.  At baseline, patient reports that he normally becomes short of breath on exertion.  He does have to take frequent breaks when he walks.  It was noted that he does become hypoxic when he ambulates on room air, but this resolves when he stops to rest.  I offered the patient home oxygen, but he wishes to hold off on that for now.  Review of records indicate this is a chronic issue as well.  He is followed by cardiology and pulmonology.  He does have chronic lymphedema, and will be restarted on Lasix on discharge.  He is also been  referred to lymphedema clinic.  He is currently afebrile and feels that he is back to his baseline level of functioning and is requesting discharge home.  Discharge Diagnoses:  Principal Problem:   Acute respiratory failure with hypoxia (HCC) Active Problems:   Type 2 diabetes mellitus with other specified complication (HCC)   Hypertension associated with diabetes (HCC)   Morbid obesity (HCC)   Hyperlipidemia associated with type 2 diabetes mellitus (HCC)   UTI (urinary tract infection)   CAP (community acquired pneumonia)    Discharge Instructions  Discharge Instructions     Ambulatory referral to Physical Therapy   Complete by: As directed    Management of lymphedema   Diet - low sodium heart healthy   Complete by: As directed    Increase activity slowly   Complete by: As directed       Allergies as of 02/11/2022       Reactions   Penicillins         Medication List     STOP taking these medications    montelukast 10 MG tablet Commonly known as: SINGULAIR   sulfamethoxazole-trimethoprim 800-160 MG tablet Commonly known as: BACTRIM DS       TAKE these medications    albuterol 108 (90 Base) MCG/ACT inhaler Commonly known as: VENTOLIN HFA Inhale 2 puffs into the lungs every 6 (six) hours as needed for wheezing or shortness of breath. What changed: Another medication with the same name was changed. Make sure you understand how and when to take each.   albuterol (2.5 MG/3ML) 0.083% nebulizer solution Commonly known as: PROVENTIL TAKE 3 MLS  BY NEBULIZATION EVERY 4 HOURS AS NEEDED FOR WHEEZING OR SHORRTNESS OF BREATH What changed: See the new instructions.   amLODipine 10 MG tablet Commonly known as: NORVASC TAKE 1 TABLET BY MOUTH EVERY DAY   atorvastatin 80 MG tablet Commonly known as: LIPITOR Take 1 tablet (80 mg total) by mouth daily.   azelastine 0.1 % nasal spray Commonly known as: ASTELIN Place 1 spray into both nostrils 2 (two) times daily.  Use in each nostril as directed   bisoprolol 10 MG tablet Commonly known as: ZEBETA Take 2 tablets (20 mg total) by mouth daily.   diclofenac 50 MG EC tablet Commonly known as: VOLTAREN TAKE 1 TABLET BY MOUTH TWICE A DAY   empagliflozin 10 MG Tabs tablet Commonly known as: Jardiance Take 1 tablet (10 mg total) by mouth daily.   fluticasone 50 MCG/ACT nasal spray Commonly known as: FLONASE Place 2 sprays into both nostrils daily.   furosemide 20 MG tablet Commonly known as: LASIX Take 1 tablet (20 mg total) by mouth daily as needed for fluid or edema.   guaiFENesin 600 MG 12 hr tablet Commonly known as: Mucinex Take 1 tablet (600 mg total) by mouth 2 (two) times daily.   hydrALAZINE 50 MG tablet Commonly known as: APRESOLINE Take 1 tablet (50 mg total) by mouth 2 (two) times daily.   levofloxacin 750 MG tablet Commonly known as: Levaquin Take 1 tablet (750 mg total) by mouth daily for 5 days.   metFORMIN 1000 MG tablet Commonly known as: GLUCOPHAGE Take 1 tablet (1,000 mg total) by mouth 2 (two) times daily with a meal.   olmesartan 40 MG tablet Commonly known as: BENICAR Take 1 tablet (40 mg total) by mouth daily.   potassium chloride 10 MEQ CR capsule Commonly known as: MICRO-K Take 1 capsule (10 mEq total) by mouth daily as needed.   sildenafil 20 MG tablet Commonly known as: Revatio 2-5 pills once daily as needed for erectile dysfunction   tamsulosin 0.4 MG Caps capsule Commonly known as: FLOMAX Take 0.4 mg by mouth daily.   Trulicity 1.5 MG/0.5ML Sopn Generic drug: Dulaglutide Inject 1.5 mg into the skin once a week.        Allergies  Allergen Reactions   Penicillins     Consultations:    Procedures/Studies: DG Chest 2 View  Result Date: 02/09/2022 CLINICAL DATA:  Short of breath for 2 days, hypoxia EXAM: CHEST - 2 VIEW COMPARISON:  11/18/2021 FINDINGS: Frontal and lateral views of the chest demonstrate a stable cardiac silhouette. Chronic  vascular congestion without airspace disease, effusion, or pneumothorax. No acute bony abnormalities. IMPRESSION: 1. Chronic vascular congestion without overt edema. Electronically Signed   By: Sharlet SalinaMichael  Brown M.D.   On: 02/09/2022 19:51   CT Angio Chest PE W and/or Wo Contrast  Result Date: 02/09/2022 CLINICAL DATA:  Short of breath for 2 days, hypoxia EXAM: CT ANGIOGRAPHY CHEST WITH CONTRAST TECHNIQUE: Multidetector CT imaging of the chest was performed using the standard protocol during bolus administration of intravenous contrast. Multiplanar CT image reconstructions and MIPs were obtained to evaluate the vascular anatomy. RADIATION DOSE REDUCTION: This exam was performed according to the departmental dose-optimization program which includes automated exposure control, adjustment of the mA and/or kV according to patient size and/or use of iterative reconstruction technique. CONTRAST:  100mL OMNIPAQUE IOHEXOL 350 MG/ML SOLN COMPARISON:  11/18/2021, 02/09/2022 FINDINGS: Cardiovascular: This is a technically adequate evaluation of the pulmonary vasculature. No filling defects or pulmonary emboli. The heart is  unremarkable without pericardial effusion. Mild atherosclerosis of the coronary vasculature. No evidence of thoracic aortic aneurysm or dissection. Mediastinum/Nodes: Multiple enlarged mediastinal and hilar lymph nodes are identified, likely reactive. Largest in the right hilum measures 17 mm in short axis. Thyroid, trachea, and esophagus are unremarkable. Lungs/Pleura: There are trace bilateral pleural effusions. Nodular ground-glass airspace disease is seen diffusely bilaterally throughout the lungs, compatible with multifocal bilateral atypical pneumonia. Minimal dependent lower lobe atelectasis. No pneumothorax. Central airways are patent. Upper Abdomen: No acute abnormality. Musculoskeletal: No acute or destructive bony lesions. Reconstructed images demonstrate no additional findings. Review of the MIP  images confirms the above findings. IMPRESSION: 1. No evidence of pulmonary embolus. 2. Trace bilateral pleural effusions and diffuse ground-glass nodularity throughout the lungs. Favor multifocal atypical infection, including viral etiology. Nonspecific inflammatory change or hypersensitivity reaction could give this appearance. 3. Likely reactive mediastinal adenopathy. 4. Coronary artery atherosclerosis. Electronically Signed   By: Sharlet SalinaMichael  Brown M.D.   On: 02/09/2022 21:52      Subjective:  Patient is feeling better today.  Denies any shortness of breath.  He is able to ambulate without shortness of breath and feels that he is functioning at baseline  Discharge Exam: Vitals:   02/11/22 0730 02/11/22 0800 02/11/22 1300 02/11/22 1315  BP:    105/66  Pulse:    68  Resp:    20  Temp:    97.7 F (36.5 C)  TempSrc:    Oral  SpO2: 97% 97% 93% 95%  Weight:      Height:        General: Pt is alert, awake, not in acute distress Cardiovascular: RRR, S1/S2 +, no rubs, no gallops Respiratory: CTA bilaterally, no wheezing, no rhonchi Abdominal: Soft, NT, ND, bowel sounds + Extremities: Bilateral lower extremity lymphedema    The results of significant diagnostics from this hospitalization (including imaging, microbiology, ancillary and laboratory) are listed below for reference.     Microbiology: Recent Results (from the past 240 hour(s))  Resp Panel by RT-PCR (Flu A&B, Covid) Nasopharyngeal Swab     Status: None   Collection Time: 02/09/22  9:28 PM   Specimen: Nasopharyngeal Swab; Nasopharyngeal(NP) swabs in vial transport medium  Result Value Ref Range Status   SARS Coronavirus 2 by RT PCR NEGATIVE NEGATIVE Final    Comment: (NOTE) SARS-CoV-2 target nucleic acids are NOT DETECTED.  The SARS-CoV-2 RNA is generally detectable in upper respiratory specimens during the acute phase of infection. The lowest concentration of SARS-CoV-2 viral copies this assay can detect is 138  copies/mL. A negative result does not preclude SARS-Cov-2 infection and should not be used as the sole basis for treatment or other patient management decisions. A negative result may occur with  improper specimen collection/handling, submission of specimen other than nasopharyngeal swab, presence of viral mutation(s) within the areas targeted by this assay, and inadequate number of viral copies(<138 copies/mL). A negative result must be combined with clinical observations, patient history, and epidemiological information. The expected result is Negative.  Fact Sheet for Patients:  BloggerCourse.comhttps://www.fda.gov/media/152166/download  Fact Sheet for Healthcare Providers:  SeriousBroker.ithttps://www.fda.gov/media/152162/download  This test is no t yet approved or cleared by the [place] FDA and  has been authorized for detection and/or diagnosis of SARS-CoV-2 by FDA under an Emergency Use Authorization (EUA). This EUA will remain  in effect (meaning this test can be used) for the duration of the COVID-19 declaration under Section 564(b)(1) of the Act, 21 U.S.C.section 360bbb-3(b)(1), unless the authorization is terminated  or revoked sooner.       Influenza A by PCR NEGATIVE NEGATIVE Final   Influenza B by PCR NEGATIVE NEGATIVE Final    Comment: (NOTE) The Xpert Xpress SARS-CoV-2/FLU/RSV plus assay is intended as an aid in the diagnosis of influenza from Nasopharyngeal swab specimens and should not be used as a sole basis for treatment. Nasal washings and aspirates are unacceptable for Xpert Xpress SARS-CoV-2/FLU/RSV testing.  Fact Sheet for Patients: BloggerCourse.com  Fact Sheet for Healthcare Providers: SeriousBroker.it  This test is not yet approved or cleared by the [place] FDA and has been authorized for detection and/or diagnosis of SARS-CoV-2 by FDA under an Emergency Use Authorization (EUA). This EUA will remain in effect (meaning  this test can be used) for the duration of the COVID-19 declaration under Section 564(b)(1) of the Act, 21 U.S.C. section 360bbb-3(b)(1), unless the authorization is terminated or revoked.  Performed at [facility], [address], [place], [place] [address]   Blood culture (routine x 2)     Status: None (Preliminary result)   Collection Time: 02/09/22 10:20 PM   Specimen: Right Antecubital; Blood  Result Value Ref Range Status   Specimen Description RIGHT ANTECUBITAL  Final   Special Requests   Final    BOTTLES DRAWN AEROBIC AND ANAEROBIC Blood Culture adequate volume   Culture   Final    NO GROWTH 2 DAYS Performed at [facility], [address], [place], [place] [address]    Report Status PENDING  Incomplete  Blood culture (routine x 2)     Status: None (Preliminary result)   Collection Time: 02/09/22 10:20 PM   Specimen: BLOOD LEFT HAND  Result Value Ref Range Status   Specimen Description BLOOD LEFT HAND  Final   Special Requests   Final    BOTTLES DRAWN AEROBIC AND ANAEROBIC Blood Culture adequate volume   Culture   Final    NO GROWTH 2 DAYS Performed at [facility], [address] [place], [place] [address]    Report Status PENDING  Incomplete  Urine Culture     Status: None   Collection Time: 02/10/22 10:14 AM   Specimen: Urine, Clean Catch  Result Value Ref Range Status   Specimen Description   Final    URINE, CLEAN CATCH Performed at [facility], [address], [place], [place] [address]    Special Requests   Final    NONE Performed at [facility], [address], [place], [place] [address]    Culture   Final    NO GROWTH Performed at [facility], [address], [place], [place] [address]    Report Status 02/11/2022 FINAL  Final     Labs: BNP (last 3 results) Recent Labs    11/18/21 1205 02/09/22 1955  BNP 36.0 60.0   Basic Metabolic Panel: Recent Labs  Lab 02/09/22 1955 02/10/22 0518 02/11/22 0442  NA 136 136 137  K 3.8 3.9 4.3  CL  104 103 103  CO2 22 20* 23  GLUCOSE 132* 129* 181*  BUN 16 16 26*  CREATININE 1.21 1.24 1.23  CALCIUM 8.7* 8.7* 9.1  MG  --  2.0  --    Liver Function Tests: Recent Labs  Lab 02/09/22 1955 02/10/22 0518 02/11/22 0442  AST 16 13* 14*  ALT 15 15 17   ALKPHOS 68 67 67  BILITOT 1.7* 1.7* 0.7  PROT 7.7 7.6 8.1  ALBUMIN 3.4* 3.4* 3.4*   No results for input(s): LIPASE,  AMYLASE in the last 168 hours. No results for input(s): AMMONIA in the last 168 hours. CBC: Recent Labs  Lab 02/09/22 1955 02/10/22 0518 02/11/22 0442  WBC 17.2* 16.9* 17.1*  NEUTROABS 15.0* 14.9*  --   HGB 14.6 13.9 13.9  HCT 46.0 45.8 45.5  MCV 78.2* 78.8* 78.4*  PLT 329 329 380   Cardiac Enzymes: No results for input(s): CKTOTAL, CKMB, CKMBINDEX, TROPONINI in the last 168 hours. BNP: Invalid input(s): POCBNP CBG: Recent Labs  Lab 02/10/22 1642 02/10/22 2045 02/10/22 2154 02/11/22 0720 02/11/22 1119  GLUCAP 175* 192* 230* 163* 204*   D-Dimer Recent Labs    02/09/22 1955  DDIMER 1.46*   Hgb A1c Recent Labs    02/10/22 0518  HGBA1C 6.2*   Lipid Profile No results for input(s): CHOL, HDL, LDLCALC, TRIG, CHOLHDL, LDLDIRECT in the last 72 hours. Thyroid function studies No results for input(s): TSH, T4TOTAL, T3FREE, THYROIDAB in the last 72 hours.  Invalid input(s): FREET3 Anemia work up No results for input(s): VITAMINB12, FOLATE, FERRITIN, TIBC, IRON, RETICCTPCT in the last 72 hours. Urinalysis    Component Value Date/Time   COLORURINE YELLOW 02/09/2022 2208   APPEARANCEUR CLEAR 02/09/2022 2208   APPEARANCEUR Cloudy (A) 05/03/2021 1439   LABSPEC 1.036 (H) 02/09/2022 2208   PHURINE 5.0 02/09/2022 2208   GLUCOSEU >=500 (A) 02/09/2022 2208   HGBUR SMALL (A) 02/09/2022 2208   BILIRUBINUR NEGATIVE 02/09/2022 2208   BILIRUBINUR Negative 05/03/2021 1439   KETONESUR 20 (A) 02/09/2022 2208   PROTEINUR 30 (A) 02/09/2022 2208   NITRITE POSITIVE (A) 02/09/2022 2208   LEUKOCYTESUR  NEGATIVE 02/09/2022 2208   Sepsis Labs Invalid input(s): PROCALCITONIN,  WBC,  LACTICIDVEN Microbiology Recent Results (from the past 240 hour(s))  Resp Panel by RT-PCR (Flu A&B, Covid) Nasopharyngeal Swab     Status: None   Collection Time: 02/09/22  9:28 PM   Specimen: Nasopharyngeal Swab; Nasopharyngeal(NP) swabs in vial transport medium  Result Value Ref Range Status   SARS Coronavirus 2 by RT PCR NEGATIVE NEGATIVE Final    Comment: (NOTE) SARS-CoV-2 target nucleic acids are NOT DETECTED.  The SARS-CoV-2 RNA is generally detectable in upper respiratory specimens during the acute phase of infection. The lowest concentration of SARS-CoV-2 viral copies this assay can detect is 138 copies/mL. A negative result does not preclude SARS-Cov-2 infection and should not be used as the sole basis for treatment or other patient management decisions. A negative result may occur with  improper specimen collection/handling, submission of specimen other than nasopharyngeal swab, presence of viral mutation(s) within the areas targeted by this assay, and inadequate number of viral copies(<138 copies/mL). A negative result must be combined with clinical observations, patient history, and epidemiological information. The expected result is Negative.  Fact Sheet for Patients:  BloggerCourse.com  Fact Sheet for Healthcare Providers:  SeriousBroker.it  This test is no t yet approved or cleared by the [place] FDA and  has been authorized for detection and/or diagnosis of SARS-CoV-2 by FDA under an Emergency Use Authorization (EUA). This EUA will remain  in effect (meaning this test can be used) for the duration of the COVID-19 declaration under Section 564(b)(1) of the Act, 21 U.S.C.section 360bbb-3(b)(1), unless the authorization is terminated  or revoked sooner.       Influenza A by PCR NEGATIVE NEGATIVE Final   Influenza B by PCR  NEGATIVE NEGATIVE Final    Comment: (NOTE) The Xpert Xpress SARS-CoV-2/FLU/RSV plus assay is intended as an aid in the diagnosis  of influenza from Nasopharyngeal swab specimens and should not be used as a sole basis for treatment. Nasal washings and aspirates are unacceptable for Xpert Xpress SARS-CoV-2/FLU/RSV testing.  Fact Sheet for Patients: BloggerCourse.com  Fact Sheet for Healthcare Providers: SeriousBroker.it  This test is not yet approved or cleared by the [place] FDA and has been authorized for detection and/or diagnosis of SARS-CoV-2 by FDA under an Emergency Use Authorization (EUA). This EUA will remain in effect (meaning this test can be used) for the duration of the COVID-19 declaration under Section 564(b)(1) of the Act, 21 U.S.C. section 360bbb-3(b)(1), unless the authorization is terminated or revoked.  Performed at [facility], [address] [place], [place] [address]   Blood culture (routine x 2)     Status: None (Preliminary result)   Collection Time: 02/09/22 10:20 PM   Specimen: Right Antecubital; Blood  Result Value Ref Range Status   Specimen Description RIGHT ANTECUBITAL  Final   Special Requests   Final    BOTTLES DRAWN AEROBIC AND ANAEROBIC Blood Culture adequate volume   Culture   Final    NO GROWTH 2 DAYS Performed at [facility], [address], [place], [place] [address]    Report Status PENDING  Incomplete  Blood culture (routine x 2)     Status: None (Preliminary result)   Collection Time: 02/09/22 10:20 PM   Specimen: BLOOD LEFT HAND  Result Value Ref Range Status   Specimen Description BLOOD LEFT HAND  Final   Special Requests   Final    BOTTLES DRAWN AEROBIC AND ANAEROBIC Blood Culture adequate volume   Culture   Final    NO GROWTH 2 DAYS Performed at [facility], [address], [place], [place] [address]    Report Status PENDING  Incomplete  Urine Culture     Status: None    Collection Time: 02/10/22 10:14 AM   Specimen: Urine, Clean Catch  Result Value Ref Range Status   Specimen Description   Final    URINE, CLEAN CATCH Performed at [facility], [address] [place], [place] [address]    Special Requests   Final    NONE Performed at [facility], [address] [place], [place] [address]    Culture   Final    NO GROWTH Performed at [facility], [address] [place], [place] [address]    Report Status 02/11/2022 FINAL  Final     Time coordinating discharge:  SIGNED:   Erick Blinks, MD  [facility] 02/11/2022, 7:40 PM   If 7PM-7AM, please contact night-coverage [email or web address]

## 2022-02-11 NOTE — Progress Notes (Addendum)
SATURATION QUALIFICATIONS: (This note is used to comply with regulatory documentation for home oxygen) ? ?Patient Saturations on Room Air at Rest = 94% ? ?Patient Saturations on Room Air while Ambulating = 87% ? ?Patient Saturations on 2 Liters of oxygen while Ambulating = 97 ? ?Please briefly explain why patient needs home oxygen:  PT dropped to 87% on RA while ambulating and was able to recover on 2l Ama at 97% Pt was SOB upon exertion.  ?

## 2022-02-11 NOTE — Progress Notes (Signed)
Pt was ambulated again without oxygen at rest on RA he was 96% While ambulating pt dropped to 85% pt was able to come back up after resting pt was able to come back up to 91%. Pt denies being SOB. Pt also states being SOB while ambulating is not a new symptom for him.   ?

## 2022-02-11 NOTE — Progress Notes (Signed)
Nsg Discharge Note ? ?Admit Date:  02/09/2022 ?Discharge date: 02/11/2022 ?  ?Marolyn Hammock Duran to be D/C'd Home per MD order.  AVS completed.  Copy for chart, and copy for patient signed, and dated. ?Patient/caregiver able to verbalize understanding. ? ?Discharge Medication: ?Allergies as of 02/11/2022   ? ?   Reactions  ? Penicillins   ? ?  ? ?  ?Medication List  ?  ? ?STOP taking these medications   ? ?montelukast 10 MG tablet ?Commonly known as: SINGULAIR ?  ?sulfamethoxazole-trimethoprim 800-160 MG tablet ?Commonly known as: BACTRIM DS ?  ? ?  ? ?TAKE these medications   ? ?albuterol 108 (90 Base) MCG/ACT inhaler ?Commonly known as: VENTOLIN HFA ?Inhale 2 puffs into the lungs every 6 (six) hours as needed for wheezing or shortness of breath. ?What changed: Another medication with the same name was changed. Make sure you understand how and when to take each. ?  ?albuterol (2.5 MG/3ML) 0.083% nebulizer solution ?Commonly known as: PROVENTIL ?TAKE 3 MLS BY NEBULIZATION EVERY 4 HOURS AS NEEDED FOR WHEEZING OR SHORRTNESS OF BREATH ?What changed: See the new instructions. ?  ?amLODipine 10 MG tablet ?Commonly known as: NORVASC ?TAKE 1 TABLET BY MOUTH EVERY DAY ?  ?atorvastatin 80 MG tablet ?Commonly known as: LIPITOR ?Take 1 tablet (80 mg total) by mouth daily. ?  ?azelastine 0.1 % nasal spray ?Commonly known as: ASTELIN ?Place 1 spray into both nostrils 2 (two) times daily. Use in each nostril as directed ?  ?bisoprolol 10 MG tablet ?Commonly known as: ZEBETA ?Take 2 tablets (20 mg total) by mouth daily. ?  ?diclofenac 50 MG EC tablet ?Commonly known as: VOLTAREN ?TAKE 1 TABLET BY MOUTH TWICE A DAY ?  ?empagliflozin 10 MG Tabs tablet ?Commonly known as: Jardiance ?Take 1 tablet (10 mg total) by mouth daily. ?  ?fluticasone 50 MCG/ACT nasal spray ?Commonly known as: FLONASE ?Place 2 sprays into both nostrils daily. ?  ?furosemide 20 MG tablet ?Commonly known as: LASIX ?Take 1 tablet (20 mg total) by mouth daily as needed  for fluid or edema. ?  ?guaiFENesin 600 MG 12 hr tablet ?Commonly known as: Mucinex ?Take 1 tablet (600 mg total) by mouth 2 (two) times daily. ?  ?hydrALAZINE 50 MG tablet ?Commonly known as: APRESOLINE ?Take 1 tablet (50 mg total) by mouth 2 (two) times daily. ?  ?levofloxacin 750 MG tablet ?Commonly known as: Levaquin ?Take 1 tablet (750 mg total) by mouth daily for 5 days. ?  ?metFORMIN 1000 MG tablet ?Commonly known as: GLUCOPHAGE ?Take 1 tablet (1,000 mg total) by mouth 2 (two) times daily with a meal. ?  ?olmesartan 40 MG tablet ?Commonly known as: BENICAR ?Take 1 tablet (40 mg total) by mouth daily. ?  ?potassium chloride 10 MEQ CR capsule ?Commonly known as: MICRO-K ?Take 1 capsule (10 mEq total) by mouth daily as needed. ?  ?sildenafil 20 MG tablet ?Commonly known as: Revatio ?2-5 pills once daily as needed for erectile dysfunction ?  ?tamsulosin 0.4 MG Caps capsule ?Commonly known as: FLOMAX ?Take 0.4 mg by mouth daily. ?  ?Trulicity 1.5 MG/0.5ML Sopn ?Generic drug: Dulaglutide ?Inject 1.5 mg into the skin once a week. ?  ? ?  ? ? ?Discharge Assessment: ?Vitals:  ? 02/11/22 1300 02/11/22 1315  ?BP:  105/66  ?Pulse:  68  ?Resp:  20  ?Temp:  97.7 ?F (36.5 ?C)  ?SpO2: 93% 95%  ? Skin clean, dry and intact without evidence of skin break down,  no evidence of skin tears noted. ?IV catheter discontinued intact. Site without signs and symptoms of complications - no redness or edema noted at insertion site, patient denies c/o pain - only slight tenderness at site.  Dressing with slight pressure applied. ? ?D/c Instructions-Education: ?Discharge instructions given to patient/family with verbalized understanding. ?D/c education completed with patient/family including follow up instructions, medication list, d/c activities limitations if indicated, with other d/c instructions as indicated by MD - patient able to verbalize understanding, all questions fully answered. ?Patient instructed to return to ED, call 911, or  call MD for any changes in condition.  ?Patient escorted via WC, and D/C home via private auto. ? ?Demetrio Lapping, LPN ?0/05/3015 0:10 PM  ?

## 2022-02-13 ENCOUNTER — Telehealth: Payer: Self-pay

## 2022-02-13 NOTE — Telephone Encounter (Signed)
Transition Care Management Follow-up Telephone Call ?Date of discharge and from where: 02/11/2022 - [facility] - acute respiratory failure with hypoxia ?How have you been since you were released from the hospital? Much better ?Any questions or concerns? No ? ?Items Reviewed: ?Did the pt receive and understand the discharge instructions provided? Yes  ?Medications obtained and verified? Yes  ?Other? No  ?Any new allergies since your discharge? No  ?Dietary orders reviewed? Yes ?Do you have support at home? Yes  ? ?Home Care and Equipment/Supplies: ?Were home health services ordered? no ? ?Were any new equipment or medical supplies ordered?  Yes: CPAP ?What is the name of the medical supply agency? unknown ?Were you able to get the supplies/equipment? He has to call and make an appt for fitting ?Do you have any questions related to the use of the equipment or supplies? No ? ?Functional Questionnaire: (I = Independent and D = Dependent) ?ADLs: I ? ?Bathing/Dressing- I ? ?Meal Prep- I ? ?Eating- I ? ?Maintaining continence- I ? ?Transferring/Ambulation- I ? ?Managing Meds- I ? ?Follow up appointments reviewed: ? ?PCP Hospital f/u appt confirmed? Yes  Scheduled to see [place] on 02/22/22 @ 11:25. ?[facility] f/u appt confirmed? No   ?Are transportation arrangements needed? No  ?If their condition worsens, is the pt aware to call PCP or go to the Emergency Dept.? Yes ?Was the patient provided with contact information for the PCP's office or ED? Yes ?Was to pt encouraged to call back with questions or concerns? Yes  ?

## 2022-02-14 LAB — CULTURE, BLOOD (ROUTINE X 2)
Culture: NO GROWTH
Culture: NO GROWTH
Special Requests: ADEQUATE
Special Requests: ADEQUATE

## 2022-02-14 LAB — LEGIONELLA PNEUMOPHILA SEROGP 1 UR AG: L. pneumophila Serogp 1 Ur Ag: NEGATIVE

## 2022-02-21 ENCOUNTER — Other Ambulatory Visit: Payer: Self-pay | Admitting: Family Medicine

## 2022-02-21 DIAGNOSIS — E1169 Type 2 diabetes mellitus with other specified complication: Secondary | ICD-10-CM

## 2022-02-21 DIAGNOSIS — E139 Other specified diabetes mellitus without complications: Secondary | ICD-10-CM

## 2022-02-22 ENCOUNTER — Ambulatory Visit: Payer: 59 | Admitting: Family Medicine

## 2022-02-22 ENCOUNTER — Encounter: Payer: Self-pay | Admitting: Family Medicine

## 2022-02-22 ENCOUNTER — Ambulatory Visit (INDEPENDENT_AMBULATORY_CARE_PROVIDER_SITE_OTHER): Payer: 59

## 2022-02-22 VITALS — BP 146/81 | HR 73 | Wt 386.0 lb

## 2022-02-22 DIAGNOSIS — N3001 Acute cystitis with hematuria: Secondary | ICD-10-CM

## 2022-02-22 DIAGNOSIS — J189 Pneumonia, unspecified organism: Secondary | ICD-10-CM | POA: Diagnosis not present

## 2022-02-22 DIAGNOSIS — J9601 Acute respiratory failure with hypoxia: Secondary | ICD-10-CM

## 2022-02-22 NOTE — Progress Notes (Signed)
? ?BP (!) 146/81   Pulse 73   Wt (!) 386 lb (175.1 kg)   SpO2 96%   BMI 53.84 kg/m?   ? ?Subjective:  ? ?Patient ID: Jason Meyer, male    DOB: 08-27-1961, 61 y.o.   MRN: [id number] ? ?HPI: ?Jason Meyer is a 61 y.o. male presenting on 02/22/2022 for TCM (Acute Respiratory Failure- feeling better) ? ? ?HPI ?Transition Care Management Follow-up Telephone Call ?Date of discharge and from where: 02/11/2022 - [facility] - acute respiratory failure with hypoxia ?How have you been since you were released from the hospital? Much better ?Any questions or concerns? No ?Patient was contacted by Adalberto Cole, LPN on 12/18/8414 ? ?Transitional care office visit ?Patient was admitted on 02/09/2022 and discharged on 02/11/2022.  He was admitted to hospital for acute respiratory failure with hypoxia and urinary symptoms although it was deemed not to be urinary tract infection and the cultures are negative.  He was sent home and treated with albuterol inhaler and an antibiotic and recommended follow-up with chest x-ray and blood work.  He was COVID-negative and flu negative.  Blood cultures were also negative.  He says he is feeling a lot better and has not really been using his inhalers and feels like his breathing is doing a lot better and his urinary symptoms are back to normal.  He does not feel like she has any other problems like living with him.  They said it was possibly a walking pneumonia ? ?Relevant past medical, surgical, family and social history reviewed and updated as indicated. Interim medical history since our last visit reviewed. ?Allergies and medications reviewed and updated. ? ?Review of Systems  ?Constitutional:  Negative for chills and fever.  ?Eyes:  Negative for visual disturbance.  ?Respiratory:  Negative for shortness of breath and wheezing.   ?Cardiovascular:  Negative for chest pain and leg swelling.  ?Musculoskeletal:  Negative for back pain and gait problem.  ?Skin:  Negative for rash.  ?All other  systems reviewed and are negative. ? ?Per HPI unless specifically indicated above ? ? ?Allergies as of 02/22/2022   ? ?   Reactions  ? Penicillins   ? ?  ? ?  ?Medication List  ?  ? ?  ? Accurate as of February 22, 2022 11:59 AM. If you have any questions, ask your nurse or doctor.  ?  ?  ? ?  ? ?albuterol 108 (90 Base) MCG/ACT inhaler ?Commonly known as: VENTOLIN HFA ?Inhale 2 puffs into the lungs every 6 (six) hours as needed for wheezing or shortness of breath. ?What changed: Another medication with the same name was changed. Make sure you understand how and when to take each. ?  ?albuterol (2.5 MG/3ML) 0.083% nebulizer solution ?Commonly known as: PROVENTIL ?TAKE 3 MLS BY NEBULIZATION EVERY 4 HOURS AS NEEDED FOR WHEEZING OR SHORRTNESS OF BREATH ?What changed: See the new instructions. ?  ?amLODipine 10 MG tablet ?Commonly known as: NORVASC ?TAKE 1 TABLET BY MOUTH EVERY DAY ?  ?atorvastatin 80 MG tablet ?Commonly known as: LIPITOR ?Take 1 tablet (80 mg total) by mouth daily. ?  ?azelastine 0.1 % nasal spray ?Commonly known as: ASTELIN ?Place 1 spray into both nostrils 2 (two) times daily. Use in each nostril as directed ?  ?bisoprolol 10 MG tablet ?Commonly known as: ZEBETA ?Take 2 tablets (20 mg total) by mouth daily. ?  ?diclofenac 50 MG EC tablet ?Commonly known as: VOLTAREN ?TAKE 1 TABLET BY MOUTH TWICE  A DAY ?  ?empagliflozin 10 MG Tabs tablet ?Commonly known as: Jardiance ?Take 1 tablet (10 mg total) by mouth daily. ?  ?fluticasone 50 MCG/ACT nasal spray ?Commonly known as: FLONASE ?Place 2 sprays into both nostrils daily. ?  ?furosemide 20 MG tablet ?Commonly known as: LASIX ?Take 1 tablet (20 mg total) by mouth daily as needed for fluid or edema. ?  ?guaiFENesin 600 MG 12 hr tablet ?Commonly known as: Mucinex ?Take 1 tablet (600 mg total) by mouth 2 (two) times daily. ?  ?hydrALAZINE 50 MG tablet ?Commonly known as: APRESOLINE ?Take 1 tablet (50 mg total) by mouth 2 (two) times daily. ?  ?metFORMIN 1000 MG  tablet ?Commonly known as: GLUCOPHAGE ?TAKE 1 TABLET (1,000 MG TOTAL) BY MOUTH 2 (TWO) TIMES DAILY WITH A MEAL. ?  ?olmesartan 40 MG tablet ?Commonly known as: BENICAR ?Take 1 tablet (40 mg total) by mouth daily. ?  ?potassium chloride 10 MEQ CR capsule ?Commonly known as: MICRO-K ?Take 1 capsule (10 mEq total) by mouth daily as needed. ?  ?sildenafil 20 MG tablet ?Commonly known as: Revatio ?2-5 pills once daily as needed for erectile dysfunction ?  ?tamsulosin 0.4 MG Caps capsule ?Commonly known as: FLOMAX ?Take 0.4 mg by mouth daily. ?  ?Trulicity 1.5 HT/3.4KA Sopn ?Generic drug: Dulaglutide ?INJECT 1.5 MG INTO THE SKIN ONCE A WEEK. ?  ? ?  ? ? ? ?Objective:  ? ?BP (!) 146/81   Pulse 73   Wt (!) 386 lb (175.1 kg)   SpO2 96%   BMI 53.84 kg/m?   ?Wt Readings from Last 3 Encounters:  ?02/22/22 (!) 386 lb (175.1 kg)  ?02/10/22 (!) 378 lb 15.5 oz (171.9 kg)  ?12/22/21 (!) 393 lb (178.3 kg)  ?  ?Physical Exam ?Vitals and nursing note reviewed.  ?Constitutional:   ?   General: He is not in acute distress. ?   Appearance: He is well-developed. He is not diaphoretic.  ?Eyes:  ?   General: No scleral icterus. ?   Conjunctiva/sclera: Conjunctivae normal.  ?Neck:  ?   Thyroid: No thyromegaly.  ?Cardiovascular:  ?   Rate and Rhythm: Normal rate and regular rhythm.  ?   Heart sounds: Normal heart sounds. No murmur heard. ?Pulmonary:  ?   Effort: Pulmonary effort is normal. No respiratory distress.  ?   Breath sounds: Normal breath sounds. No wheezing.  ?Musculoskeletal:     ?   General: Normal range of motion.  ?   Cervical back: Neck supple.  ?Lymphadenopathy:  ?   Cervical: No cervical adenopathy.  ?Skin: ?   General: Skin is warm and dry.  ?   Findings: No rash.  ?Neurological:  ?   Mental Status: He is alert and oriented to person, place, and time.  ?   Coordination: Coordination normal.  ?Psychiatric:     ?   Behavior: Behavior normal.  ? ? ? ? ?Assessment & Plan:  ? ?Problem List Items Addressed This Visit   ? ?  ?  Respiratory  ? Acute respiratory failure with hypoxia (HCC) - Primary  ? Relevant Orders  ? CBC with Differential/Platelet  ? CMP14+EGFR  ? DG Chest 2 View  ? CAP (community acquired pneumonia)  ? Relevant Orders  ? CBC with Differential/Platelet  ? CMP14+EGFR  ? DG Chest 2 View  ?  ? Genitourinary  ? UTI (urinary tract infection)  ? Relevant Orders  ? CBC with Differential/Platelet  ? CMP14+EGFR  ? DG Chest 2  View  ?  ?Chest x-ray pending ? ?We will do blood work.  Seems like he is doing better.  No changes but will follow-up as needed ? ?Follow up plan: ?Return if symptoms worsen or fail to improve. ? ?Counseling provided for all of the vaccine components ?Orders Placed This Encounter  ?Procedures  ? DG Chest 2 View  ? CBC with Differential/Platelet  ? CMP14+EGFR  ? ? ?Caryl Pina, MD ?[place] ?02/22/2022, 11:59 AM ? ? ?  ?

## 2022-02-25 LAB — CMP14+EGFR
ALT: 19 IU/L (ref 0–44)
AST: 16 IU/L (ref 0–40)
Albumin/Globulin Ratio: 1.6 (ref 1.2–2.2)
Albumin: 4.3 g/dL (ref 3.8–4.9)
Alkaline Phosphatase: 117 IU/L (ref 44–121)
BUN/Creatinine Ratio: 6 — ABNORMAL LOW (ref 9–20)
BUN: 7 mg/dL (ref 6–24)
Bilirubin Total: 0.2 mg/dL (ref 0.0–1.2)
CO2: 22 mmol/L (ref 20–29)
Calcium: 9.4 mg/dL (ref 8.7–10.2)
Chloride: 102 mmol/L (ref 96–106)
Creatinine, Ser: 1.13 mg/dL (ref 0.76–1.27)
Globulin, Total: 2.7 g/dL (ref 1.5–4.5)
Glucose: 79 mg/dL (ref 70–99)
Potassium: 5.2 mmol/L (ref 3.5–5.2)
Sodium: 143 mmol/L (ref 134–144)
Total Protein: 7 g/dL (ref 6.0–8.5)
eGFR: 75 mL/min/{1.73_m2} (ref >59)

## 2022-02-25 LAB — CBC WITH DIFFERENTIAL/PLATELET
Basophils Absolute: 0.1 10*3/uL (ref 0.0–0.2)
Basos: 1 %
EOS (ABSOLUTE): 0.2 10*3/uL (ref 0.0–0.4)
Eos: 2 %
Hematocrit: 50.4 % (ref 37.5–51.0)
Hemoglobin: 16 g/dL (ref 13.0–17.7)
Immature Grans (Abs): 0.1 10*3/uL (ref 0.0–0.1)
Immature Granulocytes: 1 %
Lymphocytes Absolute: 2.3 10*3/uL (ref 0.7–3.1)
Lymphs: 16 %
MCH: 24.6 pg — ABNORMAL LOW (ref 26.6–33.0)
MCHC: 31.7 g/dL (ref 31.5–35.7)
MCV: 77 fL — ABNORMAL LOW (ref 79–97)
Monocytes Absolute: 0.8 10*3/uL (ref 0.1–0.9)
Monocytes: 5 %
Neutrophils Absolute: 11.2 10*3/uL — ABNORMAL HIGH (ref 1.4–7.0)
Neutrophils: 75 %
Platelets: 357 10*3/uL (ref 150–450)
RBC: 6.51 x10E6/uL — ABNORMAL HIGH (ref 4.14–5.80)
RDW: 18.1 % — ABNORMAL HIGH (ref 11.6–15.4)
WBC: 14.7 10*3/uL — ABNORMAL HIGH (ref 3.4–10.8)

## 2022-03-22 ENCOUNTER — Other Ambulatory Visit: Payer: Self-pay | Admitting: Family Medicine

## 2022-03-29 ENCOUNTER — Ambulatory Visit (INDEPENDENT_AMBULATORY_CARE_PROVIDER_SITE_OTHER): Payer: 59 | Admitting: Family Medicine

## 2022-03-29 ENCOUNTER — Encounter: Payer: Self-pay | Admitting: Family Medicine

## 2022-03-29 VITALS — BP 137/78 | HR 71 | Ht 71.0 in | Wt 386.0 lb

## 2022-03-29 DIAGNOSIS — E1159 Type 2 diabetes mellitus with other circulatory complications: Secondary | ICD-10-CM

## 2022-03-29 DIAGNOSIS — I10 Essential (primary) hypertension: Secondary | ICD-10-CM | POA: Diagnosis not present

## 2022-03-29 DIAGNOSIS — E139 Other specified diabetes mellitus without complications: Secondary | ICD-10-CM

## 2022-03-29 DIAGNOSIS — E1169 Type 2 diabetes mellitus with other specified complication: Secondary | ICD-10-CM

## 2022-03-29 DIAGNOSIS — J4520 Mild intermittent asthma, uncomplicated: Secondary | ICD-10-CM

## 2022-03-29 DIAGNOSIS — E785 Hyperlipidemia, unspecified: Secondary | ICD-10-CM

## 2022-03-29 DIAGNOSIS — I152 Hypertension secondary to endocrine disorders: Secondary | ICD-10-CM

## 2022-03-29 LAB — BAYER DCA HB A1C WAIVED: HB A1C (BAYER DCA - WAIVED): 6.3 % — ABNORMAL HIGH (ref 4.8–5.6)

## 2022-03-29 IMAGING — DX DG CHEST 2V
2 series · 3 of 3 positions shown · non-contrast
Comparison: Chest x-ray and chest CT February 09, 2021.

CLINICAL DATA: Hospital follow-up for shortness of breath.

EXAM:
CHEST - 2 VIEW

[chest pa]
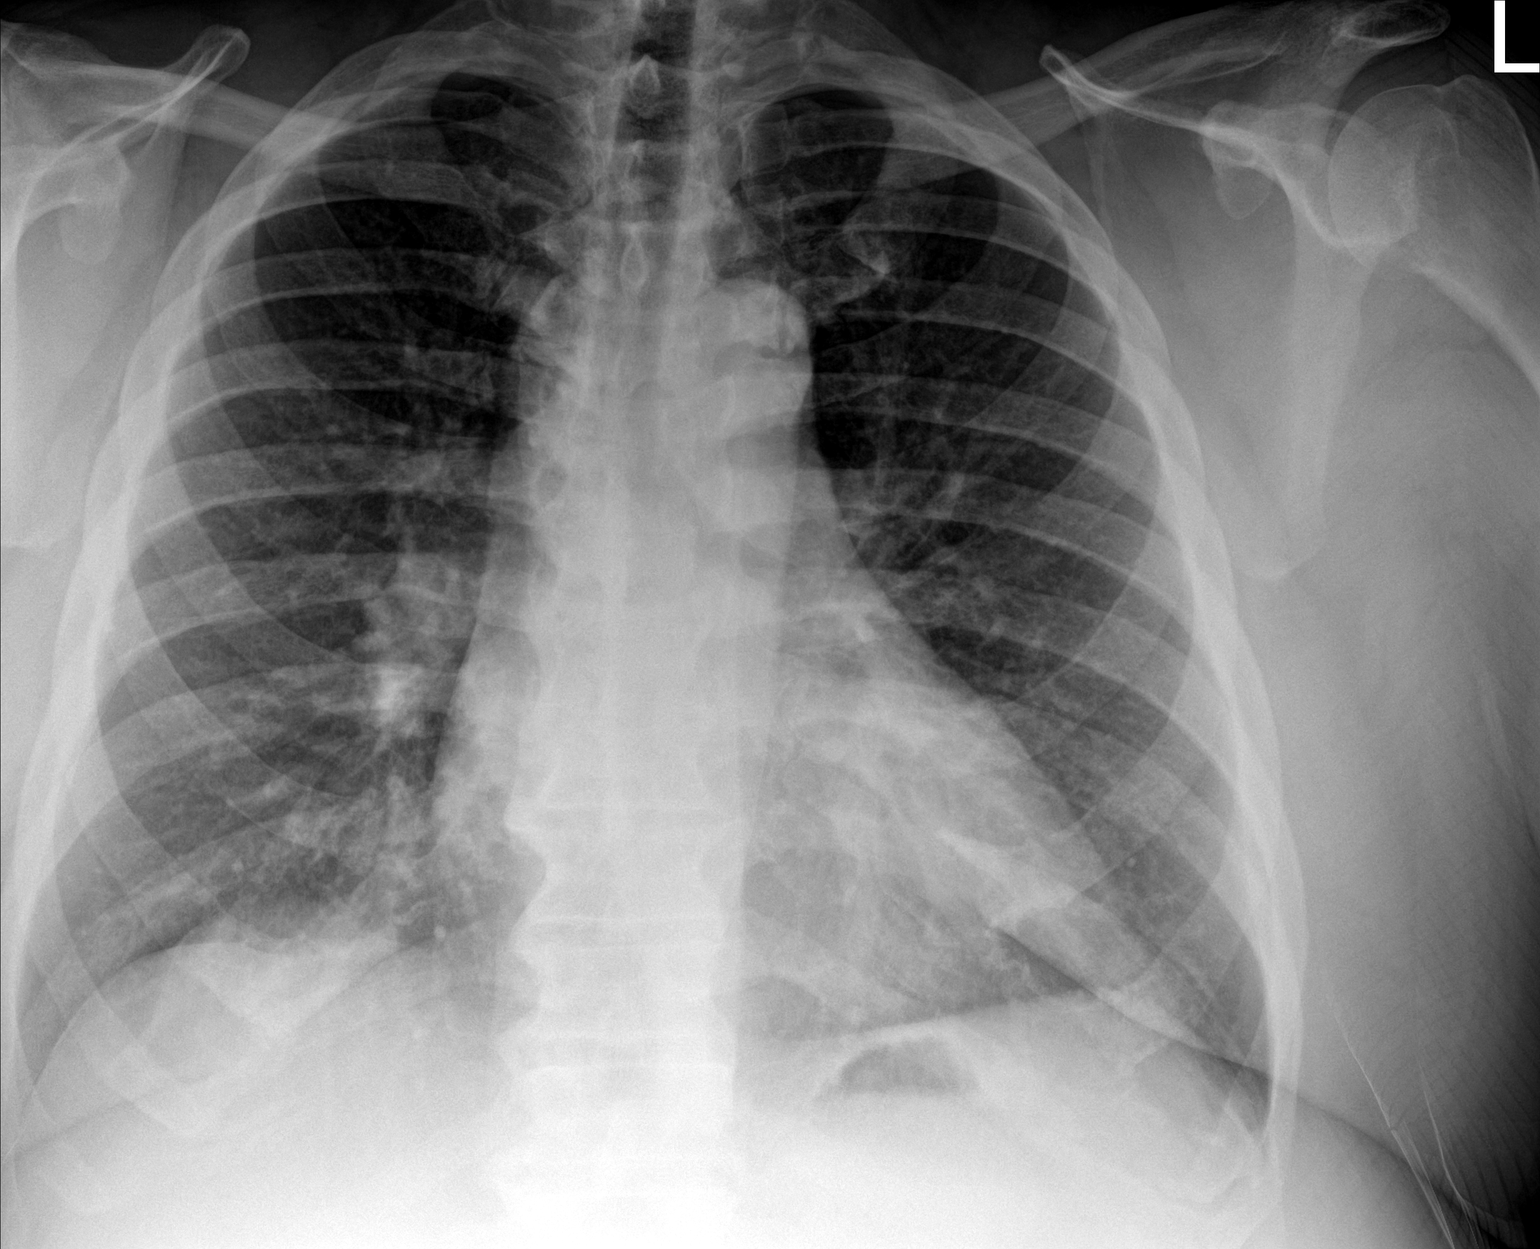

[Series 3: chest lat · 0.14mm/px · 2 of 2 slices shown]
[im 1/2]
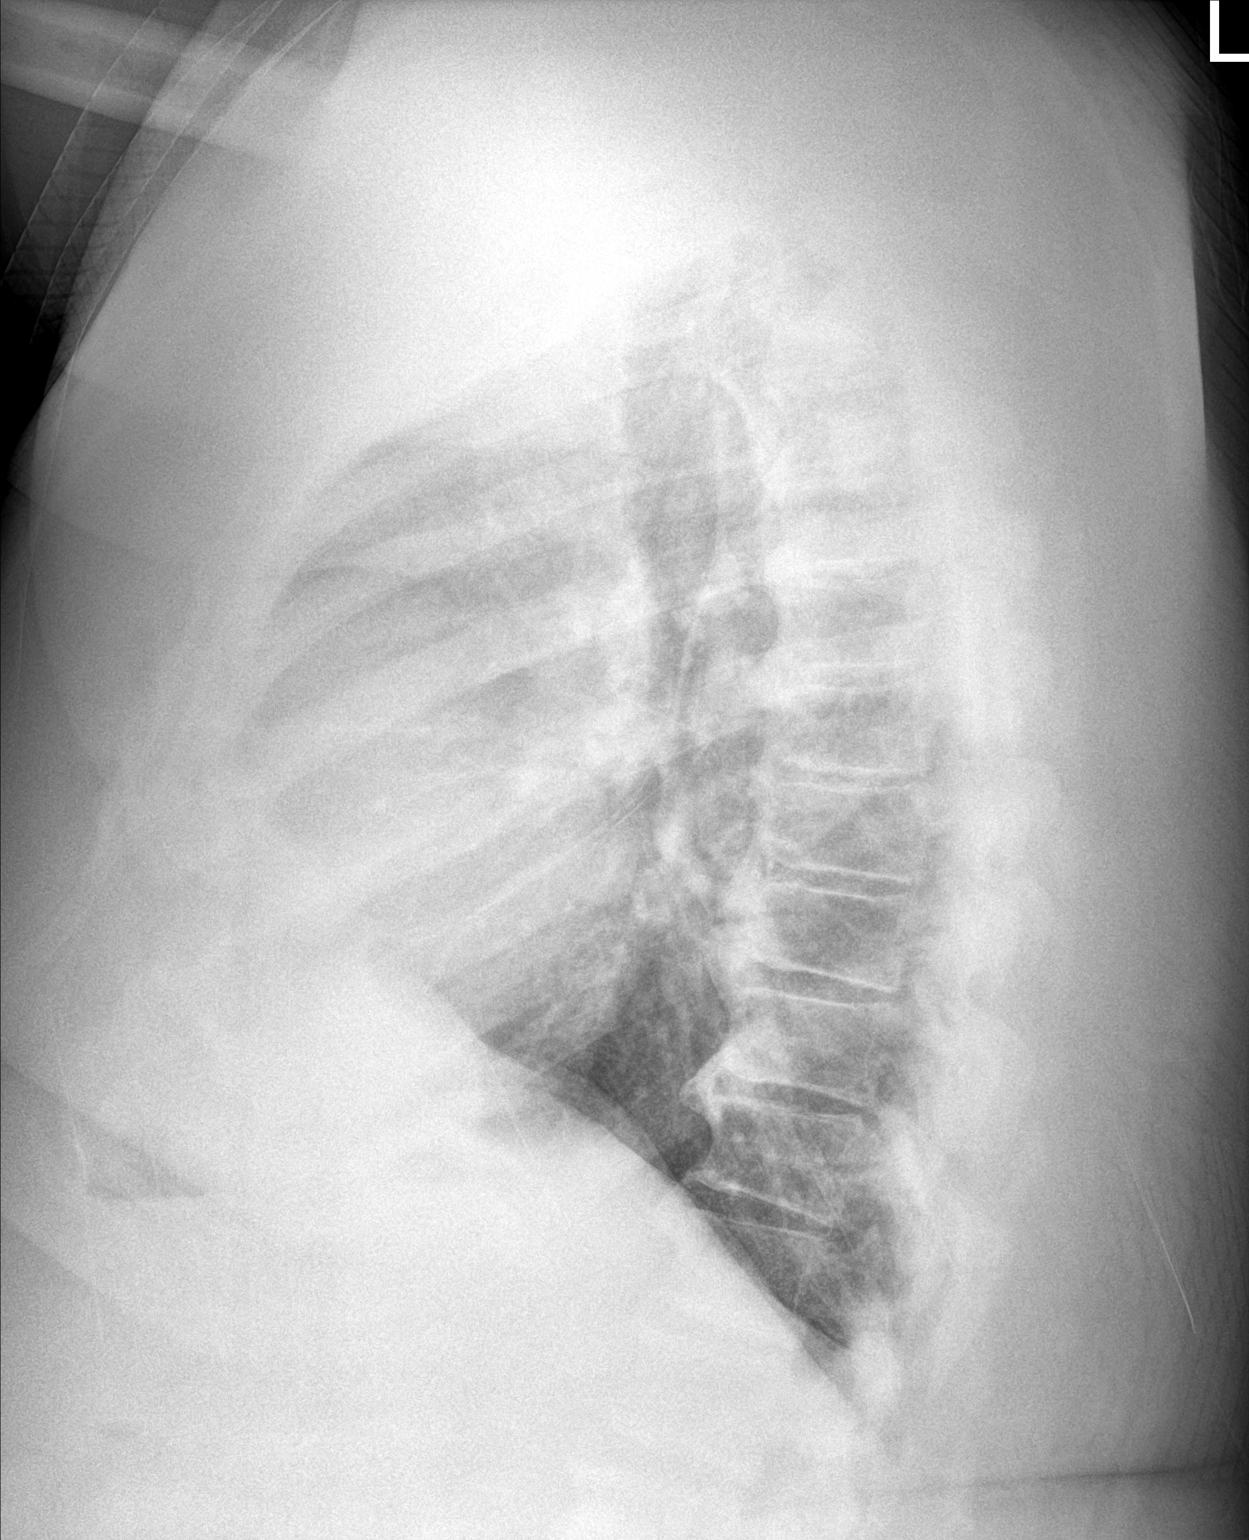
[im 2/2]
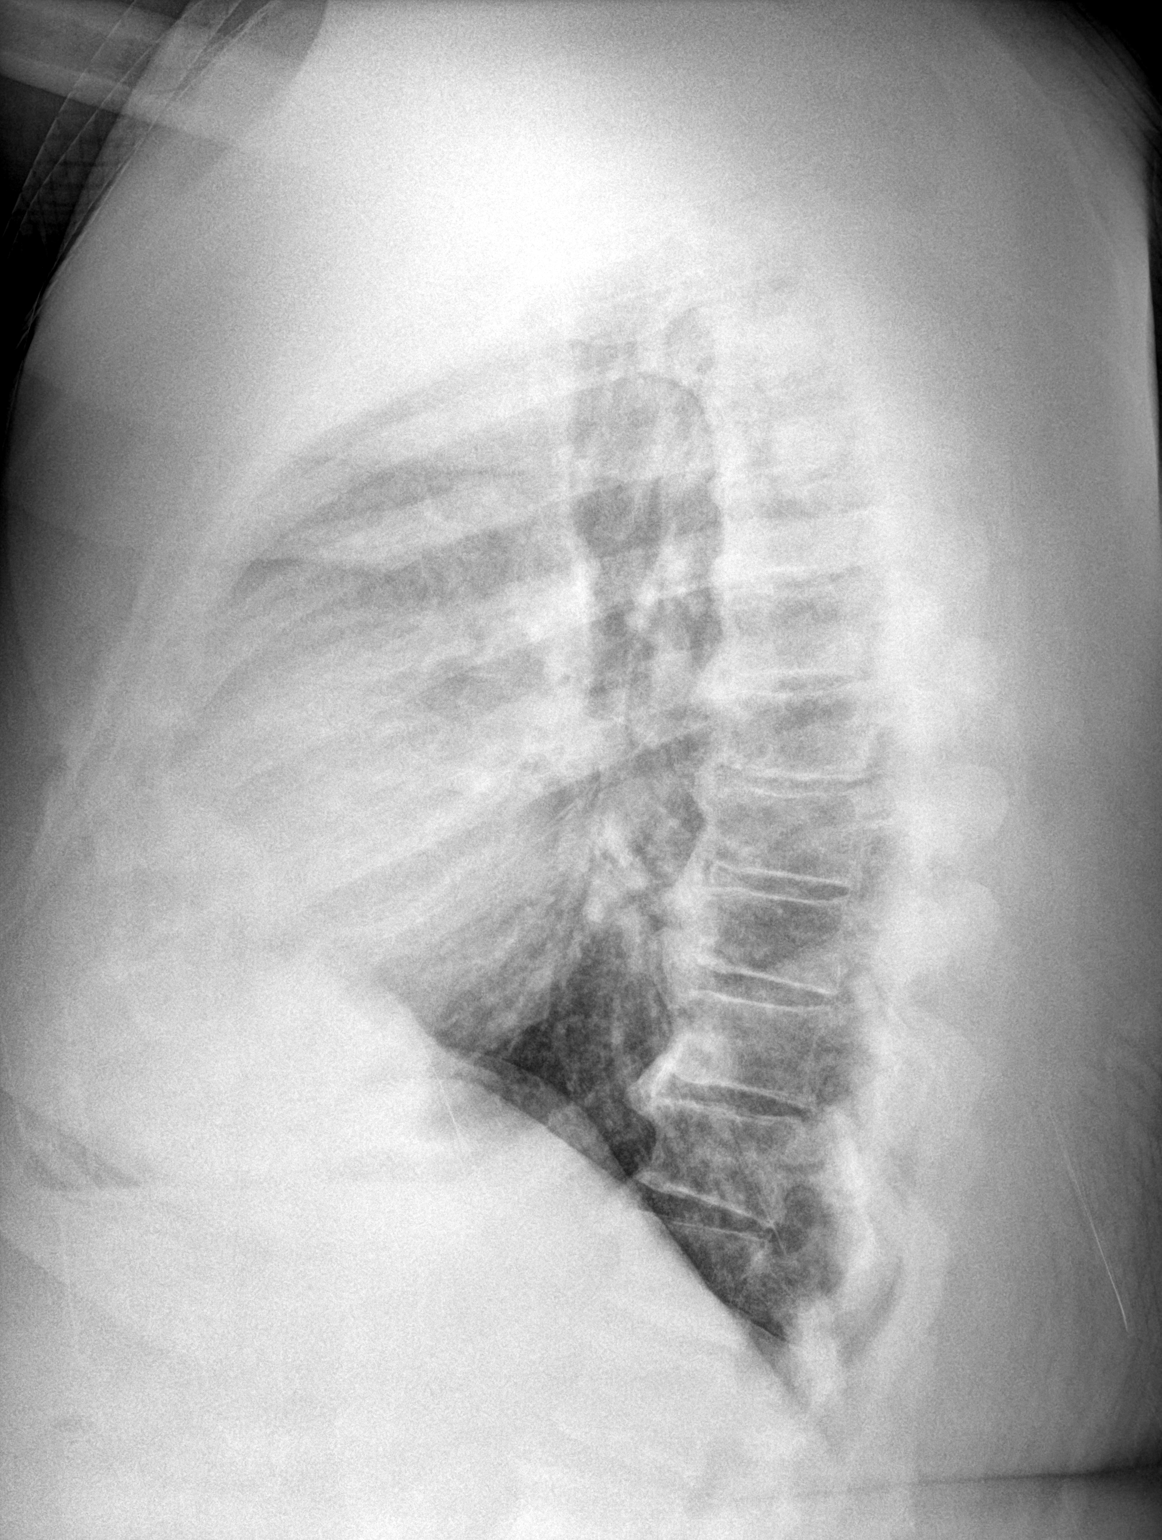

[3 of 3 positions shown; findings below may reference images not displayed]

FINDINGS: No pneumothorax. Stable cardiomegaly. The hila and mediastinum are
normal. The bilateral pulmonary infiltrates, particularly in the
bases, on the previous study have significantly improved. Mild
haziness may remain. No other acute abnormalities are identified.
IMPRESSION: The bilateral pulmonary infiltrates identified on the previous study
have significantly improved. Mild haziness may remain in the bases.
No other acute abnormalities.

## 2022-03-29 MED ORDER — FLUTICASONE FUROATE-VILANTEROL 100-25 MCG/ACT IN AEPB: 1.0000 | INHALATION_SPRAY | Freq: Every day | RESPIRATORY_TRACT | 11 refills | Status: DC

## 2022-03-29 MED ORDER — AMLODIPINE BESYLATE 10 MG PO TABS: 10.0000 mg | ORAL_TABLET | Freq: Every day | ORAL | 3 refills | Status: DC

## 2022-03-29 MED ORDER — METFORMIN HCL 1000 MG PO TABS: 1000.0000 mg | ORAL_TABLET | Freq: Two times a day (BID) | ORAL | 3 refills | Status: DC

## 2022-03-29 MED ORDER — TRULICITY 1.5 MG/0.5ML ~~LOC~~ SOAJ: 1.5000 mg | SUBCUTANEOUS | 3 refills | Status: DC

## 2022-03-29 MED ORDER — EMPAGLIFLOZIN 10 MG PO TABS: 10.0000 mg | ORAL_TABLET | Freq: Every day | ORAL | 3 refills | Status: DC

## 2022-03-29 NOTE — Progress Notes (Signed)
? ?BP 137/78   Pulse 71   Ht 5' 11"  (1.803 m)   Wt (!) 386 lb (175.1 kg)   SpO2 96%   BMI 53.84 kg/m?   ? ?Subjective:  ? ?Patient ID: Jason Meyer, male    DOB: 11-13-1961, 61 y.o.   MRN: [id number] ? ?HPI: ?Jason Meyer is a 62 y.o. male presenting on 03/29/2022 for Medical Management of Chronic Issues, Diabetes, and Hypertension ? ? ?HPI ?Type 2 diabetes mellitus ?Patient comes in today for recheck of his diabetes. Patient has been currently taking Trulicity and Jardiance and metformin. Patient is currently on an ACE inhibitor/ARB. Patient has not seen an ophthalmologist this year. Patient denies any issues with their feet. The symptom started onset as an adult hypertension and hyperlipidemia and obesity ARE RELATED TO DM  ? ?Hyperlipidemia ?Patient is coming in for recheck of his hyperlipidemia. The patient is currently taking atorvastatin. They deny any issues with myalgias or history of liver damage from it. They deny any focal numbness or weakness or chest pain.  ? ?Hypertension ?Patient is currently on olmesartan and bisoprolol and amlodipine, and their blood pressure today is 137/78. Patient denies any lightheadedness or dizziness. Patient denies headaches, blurred vision, chest pains, shortness of breath, or weakness. Denies any side effects from medication and is content with current medication.  ? ?Patient has reactive airway disease ?Patient has had problems with reactive airway disease off-and-on and they have diagnosed with that he did the [facility] for the past month and he does feel like it is making a big difference for him.  He was also doing Mucinex during the same time but has stopped that a couple weeks ago due to expense.  He feels like he is moving air a lot better and does not get as short of breath like he was before. ? ?Relevant past medical, surgical, family and social history reviewed and updated as indicated. Interim medical history since our last visit  reviewed. ?Allergies and medications reviewed and updated. ? ?Review of Systems  ?Constitutional:  Negative for chills and fever.  ?Respiratory:  Positive for cough and shortness of breath. Negative for wheezing.   ?Cardiovascular:  Negative for chest pain and leg swelling.  ?Musculoskeletal:  Negative for back pain and gait problem.  ?Skin:  Negative for rash.  ?All other systems reviewed and are negative. ? ?Per HPI unless specifically indicated above ? ? ?Allergies as of 03/29/2022   ? ?   Reactions  ? Penicillins   ? ?  ? ?  ?Medication List  ?  ? ?  ? Accurate as of March 29, 2022 11:25 AM. If you have any questions, ask your nurse or doctor.  ?  ?  ? ?  ? ?albuterol 108 (90 Base) MCG/ACT inhaler ?Commonly known as: VENTOLIN HFA ?Inhale 2 puffs into the lungs every 6 (six) hours as needed for wheezing or shortness of breath. ?What changed: Another medication with the same name was changed. Make sure you understand how and when to take each. ?  ?albuterol (2.5 MG/3ML) 0.083% nebulizer solution ?Commonly known as: PROVENTIL ?TAKE 3 MLS BY NEBULIZATION EVERY 4 HOURS AS NEEDED FOR WHEEZING OR SHORRTNESS OF BREATH ?What changed: See the new instructions. ?  ?amLODipine 10 MG tablet ?Commonly known as: NORVASC ?Take 1 tablet (10 mg total) by mouth daily. ?  ?atorvastatin 80 MG tablet ?Commonly known as: LIPITOR ?Take 1 tablet (80 mg total) by mouth daily. ?  ?azelastine 0.1 %  nasal spray ?Commonly known as: ASTELIN ?Place 1 spray into both nostrils 2 (two) times daily. Use in each nostril as directed ?  ?bisoprolol 10 MG tablet ?Commonly known as: ZEBETA ?Take 2 tablets (20 mg total) by mouth daily. ?  ?diclofenac 50 MG EC tablet ?Commonly known as: VOLTAREN ?TAKE 1 TABLET BY MOUTH TWICE A DAY ?  ?empagliflozin 10 MG Tabs tablet ?Commonly known as: Jardiance ?Take 1 tablet (10 mg total) by mouth daily. ?  ?fluticasone 50 MCG/ACT nasal spray ?Commonly known as: FLONASE ?Place 2 sprays into both nostrils daily. ?   ?fluticasone furoate-vilanterol 100-25 MCG/ACT Aepb ?Commonly known as: BREO ELLIPTA ?Inhale 1 puff into the lungs daily. ?Started by: Worthy Rancher, MD ?  ?furosemide 20 MG tablet ?Commonly known as: LASIX ?Take 1 tablet (20 mg total) by mouth daily as needed for fluid or edema. ?  ?guaiFENesin 600 MG 12 hr tablet ?Commonly known as: Mucinex ?Take 1 tablet (600 mg total) by mouth 2 (two) times daily. ?  ?hydrALAZINE 50 MG tablet ?Commonly known as: APRESOLINE ?Take 1 tablet (50 mg total) by mouth 2 (two) times daily. ?  ?metFORMIN 1000 MG tablet ?Commonly known as: GLUCOPHAGE ?Take 1 tablet (1,000 mg total) by mouth 2 (two) times daily with a meal. ?  ?olmesartan 40 MG tablet ?Commonly known as: BENICAR ?Take 1 tablet (40 mg total) by mouth daily. ?  ?potassium chloride 10 MEQ CR capsule ?Commonly known as: MICRO-K ?Take 1 capsule (10 mEq total) by mouth daily as needed. ?  ?sildenafil 20 MG tablet ?Commonly known as: Revatio ?2-5 pills once daily as needed for erectile dysfunction ?  ?tamsulosin 0.4 MG Caps capsule ?Commonly known as: FLOMAX ?Take 0.4 mg by mouth daily. ?  ?Trulicity 1.5 ML/5.4GB Sopn ?Generic drug: Dulaglutide ?Inject 1.5 mg into the skin once a week. ?  ? ?  ? ? ? ?Objective:  ? ?BP 137/78   Pulse 71   Ht 5' 11"  (1.803 m)   Wt (!) 386 lb (175.1 kg)   SpO2 96%   BMI 53.84 kg/m?   ?Wt Readings from Last 3 Encounters:  ?03/29/22 (!) 386 lb (175.1 kg)  ?02/22/22 (!) 386 lb (175.1 kg)  ?02/10/22 (!) 378 lb 15.5 oz (171.9 kg)  ?  ?Physical Exam ?Vitals and nursing note reviewed.  ?Constitutional:   ?   General: He is not in acute distress. ?   Appearance: He is well-developed. He is not diaphoretic.  ?Eyes:  ?   General: No scleral icterus. ?   Conjunctiva/sclera: Conjunctivae normal.  ?Neck:  ?   Thyroid: No thyromegaly.  ?Cardiovascular:  ?   Rate and Rhythm: Normal rate and regular rhythm.  ?   Heart sounds: Normal heart sounds. No murmur heard. ?Pulmonary:  ?   Effort: Pulmonary effort  is normal. No respiratory distress.  ?   Breath sounds: Normal breath sounds. No wheezing, rhonchi or rales.  ?Chest:  ?   Chest wall: No tenderness.  ?Musculoskeletal:  ?   Cervical back: Neck supple.  ?Lymphadenopathy:  ?   Cervical: No cervical adenopathy.  ?Skin: ?   General: Skin is warm and dry.  ?   Findings: No rash.  ?Neurological:  ?   Mental Status: He is alert and oriented to person, place, and time.  ?   Coordination: Coordination normal.  ?Psychiatric:     ?   Behavior: Behavior normal.  ? ? ? ? ?Assessment & Plan:  ? ?Problem List Items  Addressed This Visit   ? ?  ? Cardiovascular and Mediastinum  ? Hypertension associated with diabetes ([place])  ? Relevant Medications  ? amLODipine (NORVASC) 10 MG tablet  ? empagliflozin (JARDIANCE) 10 MG TABS tablet  ? metFORMIN (GLUCOPHAGE) 1000 MG tablet  ? Dulaglutide (TRULICITY) 1.5 IJ/7.9HF SOPN  ? Other Relevant Orders  ? CBC with Differential/Platelet  ? CMP14+EGFR  ? Lipid panel  ? Bayer DCA Hb A1c Waived  ?  ? Endocrine  ? Type 2 diabetes mellitus with other specified complication ([place]) - Primary  ? Relevant Medications  ? empagliflozin (JARDIANCE) 10 MG TABS tablet  ? metFORMIN (GLUCOPHAGE) 1000 MG tablet  ? Dulaglutide (TRULICITY) 1.5 HP/7.9GI SOPN  ? Other Relevant Orders  ? CBC with Differential/Platelet  ? CMP14+EGFR  ? Lipid panel  ? Bayer DCA Hb A1c Waived  ? Hyperlipidemia associated with type 2 diabetes mellitus ([place])  ? Relevant Medications  ? amLODipine (NORVASC) 10 MG tablet  ? empagliflozin (JARDIANCE) 10 MG TABS tablet  ? metFORMIN (GLUCOPHAGE) 1000 MG tablet  ? Dulaglutide (TRULICITY) 1.5 LU/0.0YH SOPN  ? Other Relevant Orders  ? CBC with Differential/Platelet  ? CMP14+EGFR  ? Lipid panel  ? Bayer DCA Hb A1c Waived  ? ?Other Visit Diagnoses   ? ? Essential hypertension      ? Relevant Medications  ? amLODipine (NORVASC) 10 MG tablet  ? Diabetes 1.5, managed as type 2 ([place])      ? Relevant Medications  ? empagliflozin (JARDIANCE) 10 MG TABS  tablet  ? metFORMIN (GLUCOPHAGE) 1000 MG tablet  ? Dulaglutide (TRULICITY) 1.5 HT/3.0HS SOPN  ? Mild intermittent reactive airway disease without complication      ? Relevant Medications  ? fluticasone furoate-vilanterol (BREO ELLIPTA) 100

## 2022-03-30 LAB — CBC WITH DIFFERENTIAL/PLATELET
Basophils Absolute: 0.1 10*3/uL (ref 0.0–0.2)
Basos: 1 %
EOS (ABSOLUTE): 0.3 10*3/uL (ref 0.0–0.4)
Eos: 3 %
Hematocrit: 45.3 % (ref 37.5–51.0)
Hemoglobin: 14.7 g/dL (ref 13.0–17.7)
Immature Grans (Abs): 0 10*3/uL (ref 0.0–0.1)
Immature Granulocytes: 1 %
Lymphocytes Absolute: 1.4 10*3/uL (ref 0.7–3.1)
Lymphs: 17 %
MCH: 24.8 pg — ABNORMAL LOW (ref 26.6–33.0)
MCHC: 32.5 g/dL (ref 31.5–35.7)
MCV: 77 fL — ABNORMAL LOW (ref 79–97)
Monocytes Absolute: 0.7 10*3/uL (ref 0.1–0.9)
Monocytes: 9 %
Neutrophils Absolute: 5.9 10*3/uL (ref 1.4–7.0)
Neutrophils: 69 %
Platelets: 310 10*3/uL (ref 150–450)
RBC: 5.92 x10E6/uL — ABNORMAL HIGH (ref 4.14–5.80)
RDW: 16.9 % — ABNORMAL HIGH (ref 11.6–15.4)
WBC: 8.4 10*3/uL (ref 3.4–10.8)

## 2022-03-30 LAB — LIPID PANEL
Chol/HDL Ratio: 4.3 ratio (ref 0.0–5.0)
Cholesterol, Total: 137 mg/dL (ref 100–199)
HDL: 32 mg/dL — ABNORMAL LOW (ref >39)
LDL Chol Calc (NIH): 86 mg/dL (ref 0–99)
Triglycerides: 99 mg/dL (ref 0–149)
VLDL Cholesterol Cal: 19 mg/dL (ref 5–40)

## 2022-03-30 LAB — CMP14+EGFR
ALT: 18 IU/L (ref 0–44)
AST: 12 IU/L (ref 0–40)
Albumin/Globulin Ratio: 1.7 (ref 1.2–2.2)
Albumin: 4.3 g/dL (ref 3.8–4.9)
Alkaline Phosphatase: 102 IU/L (ref 44–121)
BUN/Creatinine Ratio: 10 (ref 9–20)
BUN: 12 mg/dL (ref 6–24)
Bilirubin Total: 0.4 mg/dL (ref 0.0–1.2)
CO2: 26 mmol/L (ref 20–29)
Calcium: 9.8 mg/dL (ref 8.7–10.2)
Chloride: 107 mmol/L — ABNORMAL HIGH (ref 96–106)
Creatinine, Ser: 1.22 mg/dL (ref 0.76–1.27)
Globulin, Total: 2.6 g/dL (ref 1.5–4.5)
Glucose: 133 mg/dL — ABNORMAL HIGH (ref 70–99)
Potassium: 4.8 mmol/L (ref 3.5–5.2)
Sodium: 146 mmol/L — ABNORMAL HIGH (ref 134–144)
Total Protein: 6.9 g/dL (ref 6.0–8.5)
eGFR: 68 mL/min/{1.73_m2} (ref >59)

## 2022-04-01 ENCOUNTER — Other Ambulatory Visit: Payer: Self-pay | Admitting: Family Medicine

## 2022-04-01 DIAGNOSIS — E662 Morbid (severe) obesity with alveolar hypoventilation: Secondary | ICD-10-CM

## 2022-04-01 DIAGNOSIS — R0602 Shortness of breath: Secondary | ICD-10-CM

## 2022-04-27 ENCOUNTER — Ambulatory Visit (INDEPENDENT_AMBULATORY_CARE_PROVIDER_SITE_OTHER): Payer: 59 | Admitting: Family Medicine

## 2022-04-27 ENCOUNTER — Encounter: Payer: Self-pay | Admitting: Family Medicine

## 2022-04-27 VITALS — BP 149/81 | HR 78 | Temp 98.6°F | Wt 388.0 lb

## 2022-04-27 DIAGNOSIS — E1169 Type 2 diabetes mellitus with other specified complication: Secondary | ICD-10-CM | POA: Diagnosis not present

## 2022-04-27 DIAGNOSIS — R399 Unspecified symptoms and signs involving the genitourinary system: Secondary | ICD-10-CM

## 2022-04-27 LAB — URINALYSIS
Bilirubin, UA: NEGATIVE
Ketones, UA: NEGATIVE
Nitrite, UA: POSITIVE — AB
Specific Gravity, UA: 1.01 (ref 1.005–1.030)
Urobilinogen, Ur: 1 mg/dL (ref 0.2–1.0)
pH, UA: 6 (ref 5.0–7.5)

## 2022-04-27 MED ORDER — SULFAMETHOXAZOLE-TRIMETHOPRIM 800-160 MG PO TABS: 1.0000 | ORAL_TABLET | Freq: Two times a day (BID) | ORAL | 0 refills | Status: DC

## 2022-04-27 NOTE — Progress Notes (Signed)
BP (!) 149/81   Pulse 78   Temp 98.6 F (37 C)   Wt (!) 388 lb (176 kg)   SpO2 93%   BMI 54.12 kg/m    Subjective:   Patient ID: Jason Meyer, male    DOB: [birth year], 61 y.o.   MRN: [id number]  HPI: Jason Meyer is a 61 y.o. male presenting on 04/27/2022 for Urinary Tract Infection and Shortness of Breath   HPI Patient is coming in today for urinary frequency and some shortness of breath.  The shortness of breath something that has been dealing with for some time but the urinary frequency has increased over the past few weeks.  He is on Jardiance and another diuretic and he did stop both of them a couple days ago because he has been having more frequency that he thinks he should have and was concerned about it.  He denies any fevers or chills but generally just does not feel well because he feels like he has an infection coming on.  Relevant past medical, surgical, family and social history reviewed and updated as indicated. Interim medical history since our last visit reviewed. Allergies and medications reviewed and updated.  Review of Systems  Constitutional:  Negative for chills and fever.  Eyes:  Negative for visual disturbance.  Respiratory:  Negative for shortness of breath and wheezing.   Cardiovascular:  Negative for chest pain and leg swelling.  Genitourinary:  Positive for dysuria and frequency. Negative for decreased urine volume, hematuria and urgency.  Musculoskeletal:  Negative for back pain and gait problem.  Skin:  Negative for rash.  All other systems reviewed and are negative.  Per HPI unless specifically indicated above   Allergies as of 04/27/2022       Reactions   Penicillins         Medication List        Accurate as of Apr 27, 2022  2:23 PM. If you have any questions, ask your nurse or doctor.          STOP taking these medications    empagliflozin 10 MG Tabs tablet Commonly known as: Jardiance Stopped by: Elige RadonJoshua A Mailani Degroote,  MD       TAKE these medications    albuterol 108 (90 Base) MCG/ACT inhaler Commonly known as: VENTOLIN HFA Inhale 2 puffs into the lungs every 6 (six) hours as needed for wheezing or shortness of breath.   albuterol (2.5 MG/3ML) 0.083% nebulizer solution Commonly known as: PROVENTIL INHALE CONTENTS OF 1 VIAL VIA NEBULIZER EVERY 6 HOURS AS NEEDED   amLODipine 10 MG tablet Commonly known as: NORVASC Take 1 tablet (10 mg total) by mouth daily.   atorvastatin 80 MG tablet Commonly known as: LIPITOR Take 1 tablet (80 mg total) by mouth daily.   azelastine 0.1 % nasal spray Commonly known as: ASTELIN Place 1 spray into both nostrils 2 (two) times daily. Use in each nostril as directed   bisoprolol 10 MG tablet Commonly known as: ZEBETA Take 2 tablets (20 mg total) by mouth daily.   diclofenac 50 MG EC tablet Commonly known as: VOLTAREN TAKE 1 TABLET BY MOUTH TWICE A DAY   fluticasone 50 MCG/ACT nasal spray Commonly known as: FLONASE Place 2 sprays into both nostrils daily.   fluticasone furoate-vilanterol 100-25 MCG/ACT Aepb Commonly known as: BREO ELLIPTA Inhale 1 puff into the lungs daily.   furosemide 20 MG tablet Commonly known as: LASIX Take 1 tablet (20 mg total) by mouth  daily as needed for fluid or edema.   guaiFENesin 600 MG 12 hr tablet Commonly known as: Mucinex Take 1 tablet (600 mg total) by mouth 2 (two) times daily.   hydrALAZINE 50 MG tablet Commonly known as: APRESOLINE Take 1 tablet (50 mg total) by mouth 2 (two) times daily.   metFORMIN 1000 MG tablet Commonly known as: GLUCOPHAGE Take 1 tablet (1,000 mg total) by mouth 2 (two) times daily with a meal.   olmesartan 40 MG tablet Commonly known as: BENICAR Take 1 tablet (40 mg total) by mouth daily.   potassium chloride 10 MEQ CR capsule Commonly known as: MICRO-K Take 1 capsule (10 mEq total) by mouth daily as needed.   sildenafil 20 MG tablet Commonly known as: Revatio 2-5 pills once  daily as needed for erectile dysfunction   sulfamethoxazole-trimethoprim 800-160 MG tablet Commonly known as: BACTRIM DS Take 1 tablet by mouth 2 (two) times daily. Started by: Elige Radon Tawnya Pujol, MD   tamsulosin 0.4 MG Caps capsule Commonly known as: FLOMAX Take 0.4 mg by mouth daily.   Trulicity 1.5 MG/0.5ML Sopn Generic drug: Dulaglutide Inject 1.5 mg into the skin once a week.         Objective:   BP (!) 149/81   Pulse 78   Temp 98.6 F (37 C)   Wt (!) 388 lb (176 kg)   SpO2 93%   BMI 54.12 kg/m   Wt Readings from Last 3 Encounters:  04/27/22 (!) 388 lb (176 kg)  03/29/22 (!) 386 lb (175.1 kg)  02/22/22 (!) 386 lb (175.1 kg)    Physical Exam Vitals and nursing note reviewed.  Constitutional:      General: He is not in acute distress.    Appearance: He is well-developed. He is not diaphoretic.  Eyes:     General: No scleral icterus.       Right eye: No discharge.     Conjunctiva/sclera: Conjunctivae normal.     Pupils: Pupils are equal, round, and reactive to light.  Neck:     Thyroid: No thyromegaly.  Cardiovascular:     Rate and Rhythm: Normal rate and regular rhythm.     Heart sounds: Normal heart sounds. No murmur heard. Pulmonary:     Effort: Pulmonary effort is normal. No respiratory distress.     Breath sounds: Normal breath sounds. No wheezing.  Musculoskeletal:        General: Normal range of motion.     Cervical back: Neck supple.  Lymphadenopathy:     Cervical: No cervical adenopathy.  Skin:    General: Skin is warm and dry.     Findings: No rash.  Neurological:     Mental Status: He is alert and oriented to person, place, and time.     Coordination: Coordination normal.  Psychiatric:        Behavior: Behavior normal.      Assessment & Plan:   Problem List Items Addressed This Visit       Endocrine   Type 2 diabetes mellitus with other specified complication (HCC) - Primary   Relevant Medications    sulfamethoxazole-trimethoprim (BACTRIM DS) 800-160 MG tablet   Other Relevant Orders   Urine Culture   Urinalysis   Other Visit Diagnoses     UTI symptoms       Relevant Medications   sulfamethoxazole-trimethoprim (BACTRIM DS) 800-160 MG tablet   Other Relevant Orders   Urine Culture   Urinalysis     Urinalysis does show  nitrite positive and 1+ leukocytes and 1+ blood and 3+ glucose, we will have him continue to hold the Jardiance and sent in Bactrim to treat his urinary tract infection.  I have  Follow up plan: Return if symptoms worsen or fail to improve.  Counseling provided for all of the vaccine components Orders Placed This Encounter  Procedures   Urine Culture   Urinalysis    Arville Care, MD [facility] 04/27/2022, 2:23 PM

## 2022-04-30 LAB — URINE CULTURE

## 2022-05-22 ENCOUNTER — Encounter: Payer: Self-pay | Admitting: Family Medicine

## 2022-05-30 ENCOUNTER — Other Ambulatory Visit: Payer: Self-pay | Admitting: Family Medicine

## 2022-06-16 ENCOUNTER — Other Ambulatory Visit: Payer: Self-pay | Admitting: Family Medicine

## 2022-06-16 DIAGNOSIS — R0602 Shortness of breath: Secondary | ICD-10-CM

## 2022-06-16 DIAGNOSIS — E662 Morbid (severe) obesity with alveolar hypoventilation: Secondary | ICD-10-CM

## 2022-06-28 ENCOUNTER — Telehealth: Payer: Self-pay | Admitting: *Deleted

## 2022-06-28 ENCOUNTER — Telehealth (INDEPENDENT_AMBULATORY_CARE_PROVIDER_SITE_OTHER): Payer: 59 | Admitting: Family Medicine

## 2022-06-28 ENCOUNTER — Encounter: Payer: Self-pay | Admitting: Family Medicine

## 2022-06-28 DIAGNOSIS — B9689 Other specified bacterial agents as the cause of diseases classified elsewhere: Secondary | ICD-10-CM | POA: Diagnosis not present

## 2022-06-28 DIAGNOSIS — J208 Acute bronchitis due to other specified organisms: Secondary | ICD-10-CM

## 2022-06-28 MED ORDER — AZITHROMYCIN 250 MG PO TABS: ORAL_TABLET | ORAL | 0 refills | Status: DC

## 2022-06-28 MED ORDER — PREDNISONE 20 MG PO TABS: ORAL_TABLET | ORAL | 0 refills | Status: DC

## 2022-06-28 MED ORDER — GUAIFENESIN-CODEINE 100-10 MG/5ML PO SOLN: 5.0000 mL | Freq: Four times a day (QID) | ORAL | 0 refills | Status: DC | PRN

## 2022-06-28 NOTE — Progress Notes (Signed)
Telephone visit  Subjective: Jason Meyer PCP: Jason Meyer [id number] Jason Meyer is a 61 y.o. male calls for telephone consult today. Patient provides verbal consent for consult held via phone.  Due to COVID-19 pandemic this visit was conducted virtually. This visit type was conducted due to national recommendations for restrictions regarding the COVID-19 Pandemic (e.g. social distancing, sheltering in place) in an effort to limit this patient's exposure and mitigate transmission in our community. All issues noted in this document were discussed and addressed.  A physical exam was not performed with this format.   Location of patient: home Location of provider: [facility] Others present for call: none  1. Bronchitis He was hospitalized for walking pneumonia in march.  He reports a cold that onset Monday.  No fevers.  He reports chest congestion and is having shortness of breath.  He has been short of breath for over a year and a half but it is exacerbated with illness.  No hemoptysis.  No brown sputum. He reports mild wheezing with illness.  This is more obvious on CPAP machine.  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  Home COVID test negative.  He uses Breo daily and has been using the Albuterol/ Neb 2/3 times per day.    ROS: Per HPI  Allergies  Allergen Reactions   Penicillins    Past Medical History:  Diagnosis Date   Allergy    Hyperlipidemia    Hypertension    Type 2 diabetes mellitus (HCC)     Current Outpatient Medications:    albuterol (PROVENTIL) (2.5 MG/3ML) 0.083% nebulizer solution, INHALE CONTENTS OF 1 VIAL VIA NEBULIZER EVERY 6 HOURS AS NEEDED, Disp: 375 mL, Rfl: 0   albuterol (VENTOLIN HFA) 108 (90 Base) MCG/ACT inhaler, Inhale 2 puffs into the lungs every 6 (six) hours as needed for wheezing or shortness of breath., Disp: 8 g, Rfl: 2   amLODipine (NORVASC) 10 MG tablet, Take 1 tablet (10 mg total) by mouth daily., Disp: 90 tablet, Rfl:  3   atorvastatin (LIPITOR) 80 MG tablet, Take 1 tablet (80 mg total) by mouth daily., Disp: 90 tablet, Rfl: 3   azelastine (ASTELIN) 0.1 % nasal spray, Place 1 spray into both nostrils 2 (two) times daily. Use in each nostril as directed, Disp: 30 mL, Rfl: 12   bisoprolol (ZEBETA) 10 MG tablet, Take 2 tablets (20 mg total) by mouth daily., Disp: 180 tablet, Rfl: 3   diclofenac (VOLTAREN) 50 MG EC tablet, TAKE 1 TABLET BY MOUTH TWICE A DAY, Disp: 60 tablet, Rfl: 0   Dulaglutide (TRULICITY) 1.5 MG/0.5ML SOPN, Inject 1.5 mg into the skin once a week., Disp: 6 mL, Rfl: 3   fluticasone (FLONASE) 50 MCG/ACT nasal spray, Place 2 sprays into both nostrils daily., Disp: 16 g, Rfl: 6   fluticasone furoate-vilanterol (BREO ELLIPTA) 100-25 MCG/ACT AEPB, Inhale 1 puff into the lungs daily., Disp: 1 each, Rfl: 11   furosemide (LASIX) 20 MG tablet, Take 1 tablet (20 mg total) by mouth daily as needed for fluid or edema., Disp: 30 tablet, Rfl: 3   guaiFENesin (MUCINEX) 600 MG 12 hr tablet, Take 1 tablet (600 mg total) by mouth 2 (two) times daily., Disp: 60 tablet, Rfl: 2   hydrALAZINE (APRESOLINE) 50 MG tablet, Take 1 tablet (50 mg total) by mouth 2 (two) times daily., Disp: 180 tablet, Rfl: 1   metFORMIN (GLUCOPHAGE) 1000 MG tablet, Take 1 tablet (1,000 mg total) by mouth 2 (two) times daily  with a meal., Disp: 180 tablet, Rfl: 3   olmesartan (BENICAR) 40 MG tablet, Take 1 tablet (40 mg total) by mouth daily., Disp: 30 tablet, Rfl: 11   potassium chloride (MICRO-K) 10 MEQ CR capsule, Take 1 capsule (10 mEq total) by mouth daily as needed., Disp: 30 capsule, Rfl: 3   sildenafil (REVATIO) 20 MG tablet, 2-5 pills once daily as needed for erectile dysfunction, Disp: 50 tablet, Rfl: 3   sulfamethoxazole-trimethoprim (BACTRIM DS) 800-160 MG tablet, Take 1 tablet by mouth 2 (two) times daily., Disp: 20 tablet, Rfl: 0   tamsulosin (FLOMAX) 0.4 MG CAPS capsule, Take 0.4 mg by mouth daily., Disp: , Rfl:   Assessment/  Plan: 61 y.o. male   Acute bacterial bronchitis - Plan: predniSONE (DELTASONE) 20 MG tablet, azithromycin (ZITHROMAX) 250 MG tablet, guaiFENesin-codeine 100-10 MG/5ML syrup  Discussed his care with his PCP.  We will try and coordinate a 2-week follow-up for the patient.  Would strongly consider referral to allergy and asthma since he is really had a negative pulmonary work-up so far and continues to have symptoms.  May also consider something like Singulair to see if perhaps this might help his symptoms.  We discussed red flag signs and symptoms and reasons for reevaluation.  Patient voiced good understanding will follow-up as needed  Start time: 1:32pm End time: 1:44pm  Total time spent on patient care (including telephone call/ virtual visit): 11 minutes  Jason Millhouse Hulen Skains, DO [facility] [phone]

## 2022-06-28 NOTE — Telephone Encounter (Signed)
Having breathing issues / cold, no fever A lot of congestion in chest    Was in hosp in FEB for PNA  Using breathing tx and inhalers   Video visit made for today

## 2022-06-30 ENCOUNTER — Ambulatory Visit: Payer: 59 | Admitting: Family Medicine

## 2022-07-03 ENCOUNTER — Encounter: Payer: Self-pay | Admitting: *Deleted

## 2022-07-03 ENCOUNTER — Telehealth: Payer: Self-pay | Admitting: Family Medicine

## 2022-07-03 NOTE — Telephone Encounter (Signed)
This has already been completed. 

## 2022-07-04 ENCOUNTER — Other Ambulatory Visit: Payer: Self-pay | Admitting: Family Medicine

## 2022-07-04 DIAGNOSIS — R0602 Shortness of breath: Secondary | ICD-10-CM

## 2022-07-04 DIAGNOSIS — E662 Morbid (severe) obesity with alveolar hypoventilation: Secondary | ICD-10-CM

## 2022-07-07 ENCOUNTER — Other Ambulatory Visit: Payer: Self-pay | Admitting: Family Medicine

## 2022-07-14 ENCOUNTER — Ambulatory Visit (INDEPENDENT_AMBULATORY_CARE_PROVIDER_SITE_OTHER): Payer: 59 | Admitting: Family Medicine

## 2022-07-14 ENCOUNTER — Encounter: Payer: Self-pay | Admitting: Family Medicine

## 2022-07-14 VITALS — BP 131/78 | HR 78 | Temp 98.0°F | Ht 71.0 in | Wt 399.0 lb

## 2022-07-14 DIAGNOSIS — E662 Morbid (severe) obesity with alveolar hypoventilation: Secondary | ICD-10-CM | POA: Diagnosis not present

## 2022-07-14 DIAGNOSIS — R0602 Shortness of breath: Secondary | ICD-10-CM | POA: Diagnosis not present

## 2022-07-14 DIAGNOSIS — E1159 Type 2 diabetes mellitus with other circulatory complications: Secondary | ICD-10-CM

## 2022-07-14 DIAGNOSIS — E1169 Type 2 diabetes mellitus with other specified complication: Secondary | ICD-10-CM | POA: Diagnosis not present

## 2022-07-14 DIAGNOSIS — B9689 Other specified bacterial agents as the cause of diseases classified elsewhere: Secondary | ICD-10-CM

## 2022-07-14 DIAGNOSIS — I152 Hypertension secondary to endocrine disorders: Secondary | ICD-10-CM

## 2022-07-14 DIAGNOSIS — E785 Hyperlipidemia, unspecified: Secondary | ICD-10-CM

## 2022-07-14 DIAGNOSIS — M170 Bilateral primary osteoarthritis of knee: Secondary | ICD-10-CM

## 2022-07-14 DIAGNOSIS — J208 Acute bronchitis due to other specified organisms: Secondary | ICD-10-CM

## 2022-07-14 LAB — BAYER DCA HB A1C WAIVED: HB A1C (BAYER DCA - WAIVED): 5.9 % — ABNORMAL HIGH (ref 4.8–5.6)

## 2022-07-14 MED ORDER — ALBUTEROL SULFATE (2.5 MG/3ML) 0.083% IN NEBU: INHALATION_SOLUTION | RESPIRATORY_TRACT | 0 refills | Status: DC

## 2022-07-14 MED ORDER — PREDNISONE 20 MG PO TABS: ORAL_TABLET | ORAL | 0 refills | Status: DC

## 2022-07-14 MED ORDER — ATORVASTATIN CALCIUM 80 MG PO TABS: 80.0000 mg | ORAL_TABLET | Freq: Every day | ORAL | 3 refills | Status: DC

## 2022-07-14 MED ORDER — DOXYCYCLINE HYCLATE 100 MG PO TABS: 100.0000 mg | ORAL_TABLET | Freq: Two times a day (BID) | ORAL | 0 refills | Status: DC

## 2022-07-14 MED ORDER — HYDRALAZINE HCL 50 MG PO TABS: 50.0000 mg | ORAL_TABLET | Freq: Two times a day (BID) | ORAL | 1 refills | Status: DC

## 2022-07-14 NOTE — Progress Notes (Signed)
BP 131/78   Pulse 78   Temp 98 F (36.7 C)   Ht 5\' 11"  (1.803 m)   Wt (!) 399 lb (181 kg)   SpO2 92%   BMI 55.65 kg/m    Subjective:   Patient ID: Jason Meyer, male    DOB: 1961-01-05, 61 y.o.   MRN: [id number]  HPI: Jason Meyer is a 61 y.o. male presenting on 07/14/2022 for Medical Management of Chronic Issues, Diabetes, and Hypertension   HPI Type 2 diabetes mellitus Patient comes in today for recheck of his diabetes. Patient has been currently taking metformin. Patient is currently on an ACE inhibitor/ARB. Patient has not seen an ophthalmologist this year. Patient denies any issues with their feet. The symptom started onset as an adult hypertension and hyperlipidemia ARE RELATED TO DM   Hypertension Patient is currently on bisoprolol and amlodipine and olmesartan, hydralazine, and their blood pressure today is 131/78. Patient denies any lightheadedness or dizziness. Patient denies headaches, blurred vision, chest pains, shortness of breath, or weakness. Denies any side effects from medication and is content with current medication.   Hyperlipidemia Patient is coming in for recheck of his hyperlipidemia. The patient is currently taking atorvastatin. They deny any issues with myalgias or history of liver damage from it. They deny any focal numbness or weakness or chest pain.   Patient has bilateral knee osteoarthritis and has been bothered by it for some time, wants to go see an orthopedic to discuss possible gel injections or something else.  Patient has still been fighting wheezing and congestion despite antibiotic that he took a Z-Pak.  He is using his inhalers but is not improving.  He is still coughing up productive phlegm.  Relevant past medical, surgical, family and social history reviewed and updated as indicated. Interim medical history since our last visit reviewed. Allergies and medications reviewed and updated.  Review of Systems  Constitutional:  Negative  for chills and fever.  Eyes:  Negative for visual disturbance.  Respiratory:  Negative for shortness of breath and wheezing.   Cardiovascular:  Negative for chest pain and leg swelling.  Musculoskeletal:  Positive for arthralgias. Negative for back pain, gait problem and myalgias.  Skin:  Negative for color change and rash.  All other systems reviewed and are negative.   Per HPI unless specifically indicated above   Allergies as of 07/14/2022       Reactions   Penicillins Swelling        Medication List        Accurate as of July 14, 2022  2:44 PM. If you have any questions, ask your nurse or doctor.          STOP taking these medications    azithromycin 250 MG tablet Commonly known as: ZITHROMAX Stopped by: July 16, 2022 Vang Kraeger, MD   guaiFENesin-codeine 100-10 MG/5ML syrup Stopped by: Elige Radon Simrit Gohlke, MD   predniSONE 20 MG tablet Commonly known as: DELTASONE Stopped by: Elige Radon Myrka Sylva, MD       TAKE these medications    albuterol (2.5 MG/3ML) 0.083% nebulizer solution Commonly known as: PROVENTIL INHALE CONTENTS OF 1 VIAL VIA NEBULIZER EVERY 6 HOURS AS NEEDED   albuterol 108 (90 Base) MCG/ACT inhaler Commonly known as: VENTOLIN HFA TAKE 2 PUFFS BY MOUTH EVERY 6 HOURS AS NEEDED FOR WHEEZE OR SHORTNESS OF BREATH   amLODipine 10 MG tablet Commonly known as: NORVASC Take 1 tablet (10 mg total) by mouth daily.   atorvastatin 80  MG tablet Commonly known as: LIPITOR Take 1 tablet (80 mg total) by mouth daily.   azelastine 0.1 % nasal spray Commonly known as: ASTELIN Place 1 spray into both nostrils 2 (two) times daily. Use in each nostril as directed   bisoprolol 10 MG tablet Commonly known as: ZEBETA Take 2 tablets (20 mg total) by mouth daily.   diclofenac 50 MG EC tablet Commonly known as: VOLTAREN TAKE 1 TABLET BY MOUTH TWICE A DAY   fluticasone 50 MCG/ACT nasal spray Commonly known as: FLONASE Place 2 sprays into both nostrils daily.    fluticasone furoate-vilanterol 100-25 MCG/ACT Aepb Commonly known as: BREO ELLIPTA Inhale 1 puff into the lungs daily.   furosemide 20 MG tablet Commonly known as: LASIX Take 1 tablet (20 mg total) by mouth daily as needed for fluid or edema.   guaiFENesin 600 MG 12 hr tablet Commonly known as: Mucinex Take 1 tablet (600 mg total) by mouth 2 (two) times daily.   hydrALAZINE 50 MG tablet Commonly known as: APRESOLINE Take 1 tablet (50 mg total) by mouth 2 (two) times daily.   metFORMIN 1000 MG tablet Commonly known as: GLUCOPHAGE Take 1 tablet (1,000 mg total) by mouth 2 (two) times daily with a meal.   olmesartan 40 MG tablet Commonly known as: BENICAR Take 1 tablet (40 mg total) by mouth daily.   potassium chloride 10 MEQ CR capsule Commonly known as: MICRO-K Take 1 capsule (10 mEq total) by mouth daily as needed.   sildenafil 20 MG tablet Commonly known as: Revatio 2-5 pills once daily as needed for erectile dysfunction   tamsulosin 0.4 MG Caps capsule Commonly known as: FLOMAX Take 0.4 mg by mouth daily.   Trulicity 1.5 MG/0.5ML Sopn Generic drug: Dulaglutide Inject 1.5 mg into the skin once a week.         Objective:   BP 131/78   Pulse 78   Temp 98 F (36.7 C)   Ht 5\' 11"  (1.803 m)   Wt (!) 399 lb (181 kg)   SpO2 92%   BMI 55.65 kg/m   Wt Readings from Last 3 Encounters:  07/14/22 (!) 399 lb (181 kg)  04/27/22 (!) 388 lb (176 kg)  03/29/22 (!) 386 lb (175.1 kg)    Physical Exam Vitals and nursing note reviewed.  Constitutional:      General: He is not in acute distress.    Appearance: He is well-developed. He is not diaphoretic.  Eyes:     General: No scleral icterus.    Conjunctiva/sclera: Conjunctivae normal.  Neck:     Thyroid: No thyromegaly.  Cardiovascular:     Rate and Rhythm: Normal rate and regular rhythm.     Heart sounds: Normal heart sounds. No murmur heard. Pulmonary:     Effort: Pulmonary effort is normal. No respiratory  distress.     Breath sounds: Normal breath sounds. No wheezing.  Musculoskeletal:        General: Normal range of motion.     Cervical back: Neck supple.     Right knee: No bony tenderness. Normal range of motion. Tenderness present over the medial joint line.     Left knee: No bony tenderness. Normal range of motion. Tenderness present over the medial joint line.  Lymphadenopathy:     Cervical: No cervical adenopathy.  Skin:    General: Skin is warm and dry.     Findings: No rash.  Neurological:     Mental Status: He is alert and  oriented to person, place, and time.     Coordination: Coordination normal.  Psychiatric:        Behavior: Behavior normal.       Assessment & Plan:   Problem List Items Addressed This Visit       Cardiovascular and Mediastinum   Hypertension associated with diabetes (HCC)   Relevant Medications   hydrALAZINE (APRESOLINE) 50 MG tablet   atorvastatin (LIPITOR) 80 MG tablet     Endocrine   Type 2 diabetes mellitus with other specified complication (HCC) - Primary   Relevant Medications   atorvastatin (LIPITOR) 80 MG tablet   Other Relevant Orders   Bayer DCA Hb A1c Waived   Hyperlipidemia associated with type 2 diabetes mellitus (HCC)   Relevant Medications   hydrALAZINE (APRESOLINE) 50 MG tablet   atorvastatin (LIPITOR) 80 MG tablet   Other Visit Diagnoses     Shortness of breath       Relevant Medications   albuterol (PROVENTIL) (2.5 MG/3ML) 0.083% nebulizer solution   Obesity hypoventilation syndrome (HCC)       Relevant Medications   albuterol (PROVENTIL) (2.5 MG/3ML) 0.083% nebulizer solution   Bilateral primary osteoarthritis of knee       Relevant Medications   predniSONE (DELTASONE) 20 MG tablet   Other Relevant Orders   Ambulatory referral to Orthopedic Surgery   Acute bacterial bronchitis       Relevant Medications   predniSONE (DELTASONE) 20 MG tablet   doxycycline (VIBRA-TABS) 100 MG tablet       A1c 5.9 looks good,  will give short course of prednisone and will change antibiotic to doxycycline to see if that is little bit stronger for him.  Referral to orthopedic for bilateral knee osteoarthritis Follow up plan: Return in about 3 months (around 10/14/2022), or if symptoms worsen or fail to improve, for Diabetes and hypertension and cholesterol.  Counseling provided for all of the vaccine components Orders Placed This Encounter  Procedures   Bayer DCA Hb A1c Waived    Arville Care, MD [facility] 07/14/2022, 2:44 PM

## 2022-07-30 ENCOUNTER — Emergency Department (HOSPITAL_COMMUNITY): Payer: 59

## 2022-07-30 ENCOUNTER — Encounter (HOSPITAL_COMMUNITY): Payer: Self-pay

## 2022-07-30 ENCOUNTER — Other Ambulatory Visit: Payer: Self-pay

## 2022-07-30 ENCOUNTER — Observation Stay (HOSPITAL_COMMUNITY)
Admission: EM | Admit: 2022-07-30 | Discharge: 2022-08-01 | Disposition: A | Discharge status: Home / Self Care | Payer: 59 | Attending: Internal Medicine | Admitting: Internal Medicine

## 2022-07-30 DIAGNOSIS — E1169 Type 2 diabetes mellitus with other specified complication: Secondary | ICD-10-CM | POA: Insufficient documentation

## 2022-07-30 DIAGNOSIS — J810 Acute pulmonary edema: Secondary | ICD-10-CM

## 2022-07-30 DIAGNOSIS — Z87891 Personal history of nicotine dependence: Secondary | ICD-10-CM | POA: Insufficient documentation

## 2022-07-30 DIAGNOSIS — J9601 Acute respiratory failure with hypoxia: Secondary | ICD-10-CM | POA: Diagnosis not present

## 2022-07-30 DIAGNOSIS — I509 Heart failure, unspecified: Secondary | ICD-10-CM

## 2022-07-30 DIAGNOSIS — R609 Edema, unspecified: Secondary | ICD-10-CM

## 2022-07-30 DIAGNOSIS — Z79899 Other long term (current) drug therapy: Secondary | ICD-10-CM | POA: Insufficient documentation

## 2022-07-30 DIAGNOSIS — R0602 Shortness of breath: Secondary | ICD-10-CM | POA: Diagnosis present

## 2022-07-30 DIAGNOSIS — I5031 Acute diastolic (congestive) heart failure: Secondary | ICD-10-CM | POA: Insufficient documentation

## 2022-07-30 DIAGNOSIS — Z20822 Contact with and (suspected) exposure to covid-19: Secondary | ICD-10-CM | POA: Diagnosis not present

## 2022-07-30 DIAGNOSIS — I10 Essential (primary) hypertension: Secondary | ICD-10-CM | POA: Diagnosis present

## 2022-07-30 DIAGNOSIS — Z7984 Long term (current) use of oral hypoglycemic drugs: Secondary | ICD-10-CM | POA: Insufficient documentation

## 2022-07-30 DIAGNOSIS — Z7985 Long-term (current) use of injectable non-insulin antidiabetic drugs: Secondary | ICD-10-CM | POA: Insufficient documentation

## 2022-07-30 DIAGNOSIS — E1159 Type 2 diabetes mellitus with other circulatory complications: Secondary | ICD-10-CM | POA: Diagnosis present

## 2022-07-30 DIAGNOSIS — I152 Hypertension secondary to endocrine disorders: Secondary | ICD-10-CM

## 2022-07-30 DIAGNOSIS — I110 Hypertensive heart disease with heart failure: Secondary | ICD-10-CM | POA: Insufficient documentation

## 2022-07-30 DIAGNOSIS — R0603 Acute respiratory distress: Secondary | ICD-10-CM

## 2022-07-30 LAB — CBC WITH DIFFERENTIAL/PLATELET
Abs Immature Granulocytes: 0.05 K/uL (ref 0.00–0.07)
Basophils Absolute: 0.1 K/uL (ref 0.0–0.1)
Basophils Relative: 0 %
Eosinophils Absolute: 0.3 K/uL (ref 0.0–0.5)
Eosinophils Relative: 2 %
HCT: 45.3 % (ref 39.0–52.0)
Hemoglobin: 14.4 g/dL (ref 13.0–17.0)
Immature Granulocytes: 0 %
Lymphocytes Relative: 8 %
Lymphs Abs: 1.1 K/uL (ref 0.7–4.0)
MCH: 25.6 pg — ABNORMAL LOW (ref 26.0–34.0)
MCHC: 31.8 g/dL (ref 30.0–36.0)
MCV: 80.6 fL (ref 80.0–100.0)
Monocytes Absolute: 0.6 K/uL (ref 0.1–1.0)
Monocytes Relative: 5 %
Neutro Abs: 10.5 K/uL — ABNORMAL HIGH (ref 1.7–7.7)
Neutrophils Relative %: 85 %
Platelets: 269 K/uL (ref 150–400)
RBC: 5.62 MIL/uL (ref 4.22–5.81)
RDW: 17.2 % — ABNORMAL HIGH (ref 11.5–15.5)
WBC: 12.5 K/uL — ABNORMAL HIGH (ref 4.0–10.5)
nRBC: 0 % (ref 0.0–0.2)

## 2022-07-30 LAB — BASIC METABOLIC PANEL
Anion gap: 7 (ref 5–15)
BUN: 12 mg/dL (ref 6–20)
CO2: 25 mmol/L (ref 22–32)
Calcium: 8.9 mg/dL (ref 8.9–10.3)
Chloride: 108 mmol/L (ref 98–111)
Creatinine, Ser: 1 mg/dL (ref 0.61–1.24)
GFR, Estimated: >60 mL/min (ref >60)
Glucose, Bld: 117 mg/dL — ABNORMAL HIGH (ref 70–99)
Potassium: 4 mmol/L (ref 3.5–5.1)
Sodium: 140 mmol/L (ref 135–145)

## 2022-07-30 LAB — GLUCOSE, CAPILLARY: Glucose-Capillary: 209 mg/dL — ABNORMAL HIGH (ref 70–99)

## 2022-07-30 LAB — SARS CORONAVIRUS 2 BY RT PCR: SARS Coronavirus 2 by RT PCR: NEGATIVE

## 2022-07-30 LAB — TROPONIN I (HIGH SENSITIVITY): Troponin I (High Sensitivity): 7 ng/L (ref <18)

## 2022-07-30 LAB — BRAIN NATRIURETIC PEPTIDE: B Natriuretic Peptide: 61 pg/mL (ref 0.0–100.0)

## 2022-07-30 LAB — D-DIMER, QUANTITATIVE: D-Dimer, Quant: 0.66 ug{FEU}/mL — ABNORMAL HIGH (ref 0.00–0.50)

## 2022-07-30 MED ORDER — IPRATROPIUM-ALBUTEROL 0.5-2.5 (3) MG/3ML IN SOLN
3.0000 mL | Freq: Once | RESPIRATORY_TRACT | Status: AC
  Administered 2022-07-30: 3 mL via RESPIRATORY_TRACT
  Filled 2022-07-30: qty 3

## 2022-07-30 MED ORDER — INSULIN ASPART 100 UNIT/ML IJ SOLN
0.0000 [IU] | Freq: Every day | INTRAMUSCULAR | Status: DC
  Administered 2022-07-30: 2 [IU] via SUBCUTANEOUS

## 2022-07-30 MED ORDER — METHYLPREDNISOLONE SODIUM SUCC 125 MG IJ SOLR
125.0000 mg | Freq: Once | INTRAMUSCULAR | Status: AC
  Administered 2022-07-30: 125 mg via INTRAVENOUS
  Filled 2022-07-30: qty 2

## 2022-07-30 MED ORDER — ONDANSETRON HCL 4 MG/2ML IJ SOLN: 4.0000 mg | Freq: Four times a day (QID) | INTRAMUSCULAR | Status: DC | PRN

## 2022-07-30 MED ORDER — ONDANSETRON HCL 4 MG PO TABS: 4.0000 mg | ORAL_TABLET | Freq: Four times a day (QID) | ORAL | Status: DC | PRN

## 2022-07-30 MED ORDER — ENOXAPARIN SODIUM 100 MG/ML IJ SOSY
90.0000 mg | PREFILLED_SYRINGE | INTRAMUSCULAR | Status: DC
  Administered 2022-07-30 – 2022-07-31 (×2): 90 mg via SUBCUTANEOUS
  Filled 2022-07-30 (×2): qty 1

## 2022-07-30 MED ORDER — FUROSEMIDE 10 MG/ML IJ SOLN
40.0000 mg | Freq: Once | INTRAMUSCULAR | Status: AC
  Administered 2022-07-30: 40 mg via INTRAVENOUS
  Filled 2022-07-30: qty 4

## 2022-07-30 MED ORDER — IOHEXOL 350 MG/ML SOLN
75.0000 mL | Freq: Once | INTRAVENOUS | Status: AC | PRN
  Administered 2022-07-30: 75 mL via INTRAVENOUS

## 2022-07-30 MED ORDER — BISOPROLOL FUMARATE 5 MG PO TABS
20.0000 mg | ORAL_TABLET | Freq: Every day | ORAL | Status: DC
  Administered 2022-07-30 – 2022-08-01 (×3): 20 mg via ORAL
  Filled 2022-07-30 (×3): qty 4

## 2022-07-30 MED ORDER — ACETAMINOPHEN 650 MG RE SUPP: 650.0000 mg | Freq: Four times a day (QID) | RECTAL | Status: DC | PRN

## 2022-07-30 MED ORDER — POLYETHYLENE GLYCOL 3350 17 G PO PACK: 17.0000 g | PACK | Freq: Every day | ORAL | Status: DC | PRN

## 2022-07-30 MED ORDER — ACETAMINOPHEN 325 MG PO TABS: 650.0000 mg | ORAL_TABLET | Freq: Four times a day (QID) | ORAL | Status: DC | PRN

## 2022-07-30 MED ORDER — ORAL CARE MOUTH RINSE: 15.0000 mL | OROMUCOSAL | Status: DC | PRN

## 2022-07-30 MED ORDER — HYDRALAZINE HCL 25 MG PO TABS
50.0000 mg | ORAL_TABLET | Freq: Two times a day (BID) | ORAL | Status: DC
  Administered 2022-07-30 – 2022-08-01 (×4): 50 mg via ORAL
  Filled 2022-07-30 (×4): qty 2

## 2022-07-30 MED ORDER — FLUTICASONE FUROATE-VILANTEROL 100-25 MCG/ACT IN AEPB
1.0000 | INHALATION_SPRAY | Freq: Every day | RESPIRATORY_TRACT | Status: DC
Start: 2022-07-30 — End: 2022-08-01
  Administered 2022-07-31 – 2022-08-01 (×2): 1 via RESPIRATORY_TRACT
  Filled 2022-07-30: qty 28

## 2022-07-30 MED ORDER — INSULIN ASPART 100 UNIT/ML IJ SOLN
0.0000 [IU] | Freq: Three times a day (TID) | INTRAMUSCULAR | Status: DC
  Administered 2022-07-31 (×2): 3 [IU] via SUBCUTANEOUS
  Administered 2022-07-31: 5 [IU] via SUBCUTANEOUS
  Administered 2022-08-01: 2 [IU] via SUBCUTANEOUS

## 2022-07-30 MED ORDER — ALBUTEROL SULFATE (2.5 MG/3ML) 0.083% IN NEBU
2.5000 mg | INHALATION_SOLUTION | Freq: Once | RESPIRATORY_TRACT | Status: AC
  Administered 2022-07-30: 2.5 mg via RESPIRATORY_TRACT
  Filled 2022-07-30: qty 3

## 2022-07-30 MED ORDER — FUROSEMIDE 10 MG/ML IJ SOLN
40.0000 mg | Freq: Two times a day (BID) | INTRAMUSCULAR | Status: DC
  Administered 2022-07-31 (×2): 40 mg via INTRAVENOUS
  Filled 2022-07-30 (×2): qty 4

## 2022-07-30 MED ORDER — IRBESARTAN 150 MG PO TABS
300.0000 mg | ORAL_TABLET | Freq: Every day | ORAL | Status: DC
  Administered 2022-07-30: 300 mg via ORAL
  Filled 2022-07-30: qty 2

## 2022-07-30 MED ORDER — ATORVASTATIN CALCIUM 40 MG PO TABS
80.0000 mg | ORAL_TABLET | Freq: Every day | ORAL | Status: DC
  Administered 2022-07-30 – 2022-07-31 (×2): 80 mg via ORAL
  Filled 2022-07-30 (×2): qty 2

## 2022-07-30 MED ORDER — TAMSULOSIN HCL 0.4 MG PO CAPS
0.4000 mg | ORAL_CAPSULE | Freq: Every day | ORAL | Status: DC
  Administered 2022-07-30 – 2022-08-01 (×3): 0.4 mg via ORAL
  Filled 2022-07-30 (×3): qty 1

## 2022-07-30 MED ORDER — ALBUTEROL (5 MG/ML) CONTINUOUS INHALATION SOLN
10.0000 mg/h | INHALATION_SOLUTION | Freq: Once | RESPIRATORY_TRACT | Status: DC
  Filled 2022-07-30: qty 20

## 2022-07-30 MED ORDER — ALBUTEROL SULFATE (2.5 MG/3ML) 0.083% IN NEBU: 2.5000 mg | INHALATION_SOLUTION | Freq: Four times a day (QID) | RESPIRATORY_TRACT | Status: DC | PRN

## 2022-07-30 NOTE — ED Notes (Signed)
Patient back from CT.

## 2022-07-30 NOTE — ED Notes (Signed)
Pt eating

## 2022-07-30 NOTE — H&P (Addendum)
History and Physical    Jason Meyer KZL:[id number] DOB: 12-Jun-1961 DOA: 07/30/2022  PCP: Dettinger, Elige Radon, MD   Patient coming from: Home  I have personally briefly reviewed patient's old medical records in [facility]  Chief Complaint: Difficulty breathing  HPI: Jason Meyer is a 61 y.o. male with medical history significant for diabetes mellitus, hypertension, obesity, OSA. Patient presented to the ED with complaints of difficulty breathing with activity that started yesterday.  He denies difficulty breathing while lying down, denies having to sit up to sleep, or increased use of pillows.  Reports bilateral lower extremity swelling over the past week.  He denies abdominal bloating.  He reports his normal weight is about 390-400 pounds but this fluctuates.  He believes he has been taking a fluid pill every day, he is not sure.  Reports compliance with his CPAP since he was diagnosed with OSA about 6 months ago.  No cough no fevers.  No chest pain. EMS was called yesterday for similar symptoms, his O2 sats were normal so he declined transport to the hospital.  ED Course: O2 sats 86% on room air.  Otherwise stable vitals.  WBC 12.5.  D-dimer 0.66.  BNP 61.  Chest x-ray without acute abnormality, subsequent CTA chest limited study, negative for PE, suggests mild CHF.  Bronchodilator nebulizers, Solu-Medrol 125 mg, and Lasix 40 mg x 1 given.  Review of Systems: As per HPI all other systems reviewed and negative.  Past Medical History:  Diagnosis Date   Allergy    Hyperlipidemia    Hypertension    Type 2 diabetes mellitus (HCC)     Past Surgical History:  Procedure Laterality Date   NO PAST SURGERIES       reports that he quit smoking about 14 years ago. His smoking use included cigarettes. He has a 33.00 pack-year smoking history. He has never used smokeless tobacco. He reports that he does not drink alcohol and does not use drugs.  Allergies  Allergen Reactions    Penicillins Swelling    Family History  Problem Relation Age of Onset   Heart attack Brother    Alzheimer's disease Mother    Heart attack Father    Stroke Father     Prior to Admission medications   Medication Sig Start Date End Date Taking? Authorizing Provider  albuterol (PROVENTIL) (2.5 MG/3ML) 0.083% nebulizer solution INHALE CONTENTS OF 1 VIAL VIA NEBULIZER EVERY 6 HOURS AS NEEDED Patient taking differently: Take 2.5 mg by nebulization every 6 (six) hours as needed for wheezing or shortness of breath. 07/14/22   Dettinger, Elige Radon, MD  albuterol (VENTOLIN HFA) 108 (90 Base) MCG/ACT inhaler TAKE 2 PUFFS BY MOUTH EVERY 6 HOURS AS NEEDED FOR WHEEZE OR SHORTNESS OF BREATH Patient taking differently: Inhale 2 puffs into the lungs every 6 (six) hours as needed for wheezing or shortness of breath. 07/04/22   Dettinger, Elige Radon, MD  amLODipine (NORVASC) 10 MG tablet Take 1 tablet (10 mg total) by mouth daily. 03/29/22   Dettinger, Elige Radon, MD  atorvastatin (LIPITOR) 80 MG tablet Take 1 tablet (80 mg total) by mouth daily. 07/14/22   Dettinger, Elige Radon, MD  azelastine (ASTELIN) 0.1 % nasal spray Place 1 spray into both nostrils 2 (two) times daily. Use in each nostril as directed 11/17/21   Dettinger, Elige Radon, MD  bisoprolol (ZEBETA) 10 MG tablet Take 2 tablets (20 mg total) by mouth daily. 12/22/21   Dettinger, Elige Radon, MD  diclofenac (VOLTAREN) 50 MG EC tablet TAKE 1 TABLET BY MOUTH TWICE A DAY Patient taking differently: Take 50 mg by mouth 2 (two) times daily. 07/07/22   Dettinger, Elige Radon, MD  doxycycline (VIBRA-TABS) 100 MG tablet Take 1 tablet (100 mg total) by mouth 2 (two) times daily. 1 po bid 07/14/22   Dettinger, Elige Radon, MD  Dulaglutide (TRULICITY) 1.5 MG/0.5ML SOPN Inject 1.5 mg into the skin once a week. 03/29/22   Dettinger, Elige Radon, MD  fluticasone (FLONASE) 50 MCG/ACT nasal spray Place 2 sprays into both nostrils daily. 04/02/17   Dettinger, Elige Radon, MD  fluticasone  furoate-vilanterol (BREO ELLIPTA) 100-25 MCG/ACT AEPB Inhale 1 puff into the lungs daily. 03/29/22   Dettinger, Elige Radon, MD  furosemide (LASIX) 20 MG tablet Take 1 tablet (20 mg total) by mouth daily as needed for fluid or edema. 02/11/22   Erick Blinks, MD  guaiFENesin (MUCINEX) 600 MG 12 hr tablet Take 1 tablet (600 mg total) by mouth 2 (two) times daily. 02/11/22 02/11/23  Erick Blinks, MD  hydrALAZINE (APRESOLINE) 50 MG tablet Take 1 tablet (50 mg total) by mouth 2 (two) times daily. 07/14/22   Dettinger, Elige Radon, MD  metFORMIN (GLUCOPHAGE) 1000 MG tablet Take 1 tablet (1,000 mg total) by mouth 2 (two) times daily with a meal. 03/29/22   Dettinger, Elige Radon, MD  olmesartan (BENICAR) 40 MG tablet Take 1 tablet (40 mg total) by mouth daily. 10/13/21 10/13/22  Nyoka Cowden, MD  potassium chloride (MICRO-K) 10 MEQ CR capsule Take 1 capsule (10 mEq total) by mouth daily as needed. 02/11/22   Erick Blinks, MD  predniSONE (DELTASONE) 20 MG tablet 2 po at same time daily for 5 days Patient taking differently: Take 20 mg by mouth 2 (two) times daily with a meal. 2 po at same time daily for 5 days 07/14/22   Dettinger, Elige Radon, MD  sildenafil (REVATIO) 20 MG tablet 2-5 pills once daily as needed for erectile dysfunction Patient taking differently: Take 20 mg by mouth as needed (erectile dysfunction). 07/07/19   Dettinger, Elige Radon, MD  tamsulosin (FLOMAX) 0.4 MG CAPS capsule Take 0.4 mg by mouth daily. 06/21/21   [provider]    Physical Exam: Vitals:   07/30/22 1530 07/30/22 1600 07/30/22 1630 07/30/22 1700  BP: 139/77 (!) 144/82 (!) 154/78 (!) 156/79  Pulse: 72 75 73 76  Resp: 17 16 19 18   Temp:      TempSrc:      SpO2: 95% 95% 95% 95%  Weight:      Height:        Constitutional: NAD, calm, comfortable Vitals:   07/30/22 1530 07/30/22 1600 07/30/22 1630 07/30/22 1700  BP: 139/77 (!) 144/82 (!) 154/78 (!) 156/79  Pulse: 72 75 73 76  Resp: 17 16 19 18   Temp:      TempSrc:       SpO2: 95% 95% 95% 95%  Weight:      Height:       Eyes: PERRL, lids and conjunctivae normal ENMT: Mucous membranes are moist  Neck: normal, supple, no masses, no thyromegaly Respiratory: clear to auscultation bilaterally, no wheezing, no crackles. Normal respiratory effort. No accessory muscle use.  Cardiovascular: Regular rate and rhythm, no murmurs / rubs / gallops.  Bilateral woody induration of the lower extremities from chronic stasis changes , lower extremities warm.  Abdomen: obese, no tenderness, no masses palpated.  Musculoskeletal: no clubbing / cyanosis. No joint deformity upper and  lower extremities.  Skin: Woody induration to bilateral legs, no ulcers or drainage no rashes. Neurologic: No apparent cranial nerve abnormality, moving extremities spontaneously.  Psychiatric: Normal judgment and insight. Alert and oriented x 3. Normal mood.   Labs on Admission: I have personally reviewed following labs and imaging studies  CBC: Recent Labs  Lab 07/30/22 1117  WBC 12.5*  NEUTROABS 10.5*  HGB 14.4  HCT 45.3  MCV 80.6  PLT 269   Basic Metabolic Panel: Recent Labs  Lab 07/30/22 1117  NA 140  K 4.0  CL 108  CO2 25  GLUCOSE 117*  BUN 12  CREATININE 1.00  CALCIUM 8.9   Radiological Exams on Admission: CT Angio Chest PE W and/or Wo Contrast  Result Date: 07/30/2022 CLINICAL DATA:  Evaluate for pulmonary embolus. Worsening shortness of breath. Positive D-dimer. EXAM: CT ANGIOGRAPHY CHEST WITH CONTRAST TECHNIQUE: Multidetector CT imaging of the chest was performed using the standard protocol during bolus administration of intravenous contrast. Multiplanar CT image reconstructions and MIPs were obtained to evaluate the vascular anatomy. RADIATION DOSE REDUCTION: This exam was performed according to the departmental dose-optimization program which includes automated exposure control, adjustment of the mA and/or kV according to patient size and/or use of iterative  reconstruction technique. CONTRAST:  24mL OMNIPAQUE IOHEXOL 350 MG/ML SOLN COMPARISON:  02/09/2022 FINDINGS: Cardiovascular: Exam detail is diminished secondary to patient body habitus and suboptimal pulmonary arterial opacification. This is compounded by respiratory motion artifact involving the upper and lower lung zones. Within these limitations, there are no signs to suggest a central obstructing pulmonary embolus. Beyond the level of the main pulmonary arteries there is markedly diminished exam detail. The heart size is enlarged. Calcifications identified within the LAD coronary artery. No pericardial effusion. Mediastinum/Nodes: Thyroid gland, trachea and esophagus are unremarkable. No enlarged axillary lymph nodes. Prominent mediastinal lymph nodes are again noted in appears similar to the prior exam. For example, the subcarinal lymph node measures 1.8 cm, image 53/4. Unchanged from the previous exam. Low left paratracheal lymph node measures 1.5 cm, image 34/4. Also unchanged. Lungs/Pleura: There are small bilateral pleural effusions. Mild diffuse ground-glass attenuation is identified. Interlobular septal thickening noted within the lung bases. Focal area of air trapping noted within the medial right middle lobe. Upper Abdomen: No acute abnormality. Musculoskeletal: No acute or suspicious osseous findings. Spondylosis identified within the thoracic spine. Review of the MIP images confirms the above findings. IMPRESSION: 1. Exam detail is markedly diminished secondary to patient body habitus, respiratory motion artifact and suboptimal pulmonary arterial opacification. Within these limitations, there are no signs to suggest a central obstructing pulmonary embolus. If there is a continued clinical concern for acute pulmonary embolus repeat imaging is recommended once the patient is clinically stable, able to remain motionless and breath hold for the duration of the exam. 2. Cardiac enlargement, bilateral  pleural effusions and diffuse ground-glass attenuation suggestive of mild CHF. 3. Coronary artery calcifications. Electronically Signed   By: Signa Kell M.D.   On: 07/30/2022 15:27   DG Chest 2 View  Result Date: 07/30/2022 CLINICAL DATA:  Shortness of breath 2-3 years worse over the past few months. Worse with exertion. EXAM: CHEST - 2 VIEW COMPARISON:  02/22/2022 FINDINGS: Lungs are adequately inflated without focal airspace consolidation or effusion. Subtle prominence of the right infrahilar vessels unchanged cardiomediastinal silhouette and remainder of the exam is unchanged. IMPRESSION: No active cardiopulmonary disease. Electronically Signed   By: Elberta Fortis M.D.   On: 07/30/2022 11:16  EKG: Independently reviewed.   Assessment/Plan Principal Problem:   Acute respiratory failure with hypoxia (HCC) Active Problems:   Type 2 diabetes mellitus with other specified complication (HCC)   Hypertension associated with diabetes (HCC)   Morbid obesity (HCC)   Acute congestive heart failure (HCC)   Assessment and Plan: * Acute respiratory failure with hypoxia (HCC) O2 sats down to 86% on room air, placed on 2 L.  Likely due to CHF.  Recent diagnosis of OSA, reports compliance with CPAP.  Not on home O2.  Acute congestive heart failure (HCC) As yet unspecified type.  Reports bilateral lower extremity swelling, with dyspnea and hypoxia, chest CT suggesting mild congestive heart failure.  Per Chart- cant tell if any significant recent weight changes per chart.  BNP 61, likely not reflective of volume status due to habitus.  Unable to tell volume status due to body habitus.  Last echo 08/2021 EF of 60 to 65%, moderate LVH.  Started on 20mg  PRN Lasix 02/2022 for lymphedema. -IV Lasix 40 twice daily -Strict input output, daily weights, daily BMP -Updated echocardiogram  Morbid obesity (HCC) BMI 54.  Encourage weight loss diet and exercise.  Hypertension associated with diabetes  (HCC) Systolic 130s to 03/2022. -Resume bisoprolol, hydralazine and ARB, tamsulosin -Hold Norvasc for now as I am unsure of compliance with his medications   Type 2 diabetes mellitus with other specified complication (HCC) Controlled, A1c 5.9.   - SSI- S -Hold Trulicity, metformin   DVT prophylaxis: Lovenox Code Status: Full  Family Communication: 2 sons at bedside Disposition Plan: ~ 2 days Consults called: None Admission status:  Inpt tele I certify that at the point of admission it is my clinical judgment that the patient will require inpatient hospital care spanning beyond 2 midnights from the point of admission due to high intensity of service, high risk for further deterioration and high frequency of surveillance required.    Author: [id number], MD 07/30/2022 7:52 PM  For on call review [email or web address].

## 2022-07-30 NOTE — ED Notes (Signed)
RT aware of cont neb

## 2022-07-30 NOTE — Assessment & Plan Note (Signed)
Controlled, A1c 5.9.   - SSI- S -Hold Trulicity, metformin

## 2022-07-30 NOTE — Assessment & Plan Note (Signed)
BMI 54.  Encourage weight loss diet and exercise.

## 2022-07-30 NOTE — ED Notes (Signed)
When RT arrived patient was sitting on the side of the bed. He was wearing his [place] at 2lpm. No distress noted. BBS auscultated. Patient is completely clear throughout. He requested to eat before his treatment. I am going to DC this 10mg  Albuterol. RT protocol assessment completed. The patient only scored a 3. If the patient is in need of a neb after eating please contact me.

## 2022-07-30 NOTE — ED Triage Notes (Signed)
Patient reports worsening  SHOB that started yesterday while ambulating. States that this has been on-going issue for past two years.States that he called EMS yesterday and they evaluated him with normal oxygen sats therefore he did not come in. States that he does not wear oxygen at home.

## 2022-07-30 NOTE — Progress Notes (Signed)
Set patient's home CPAP up for him and assisted with him getting it on. Patient needed new mask so provided hospital mask size large to use with his machine. Plugged into red outlet and machine working appropriately.

## 2022-07-30 NOTE — Assessment & Plan Note (Addendum)
As yet unspecified type.  Reports bilateral lower extremity swelling, with dyspnea and hypoxia, chest CT suggesting mild congestive heart failure.  Per Chart- cant tell if any significant recent weight changes per chart.  BNP 61, likely not reflective of volume status due to habitus.  Unable to tell volume status due to body habitus.  Last echo 08/2021 EF of 60 to 65%, moderate LVH.  Started on 20mg  PRN Lasix 02/2022 for lymphedema. -IV Lasix 40 twice daily -Strict input output, daily weights, daily BMP -Updated echocardiogram

## 2022-07-30 NOTE — Assessment & Plan Note (Addendum)
Systolic 130s to 009Q. -Resume bisoprolol, hydralazine and ARB, tamsulosin -Hold Norvasc for now as I am unsure of compliance with his medications

## 2022-07-30 NOTE — Assessment & Plan Note (Signed)
O2 sats down to 86% on room air, placed on 2 L.  Likely due to CHF.  Recent diagnosis of OSA, reports compliance with CPAP.  Not on home O2.

## 2022-07-30 NOTE — ED Notes (Signed)
Ambulated pt with monitor approx 30yds and back. When on way back pt breathing hard, c/o sob  and being tired. Pt was visibly sob with tachypnea. Sats were 82% ra when done and hr 105. 02 2l [place] applied when sat back down Pt recovered within 2 minutes with sats 96% 02 2l  and hr 82. PA aware

## 2022-07-30 NOTE — ED Notes (Signed)
Patient transported to CT 

## 2022-07-30 NOTE — ED Provider Notes (Signed)
[name] EMERGENCY DEPARTMENT Provider Note   CSN: [id number] Arrival date & time: 07/30/22  1038     History  Chief Complaint  Patient presents with   Shortness of Breath    Jason Meyer is a 61 y.o. male with a history including type 2 diabetes, hypertension, hyperlipidemia, morbid obesity, recently diagnosed sleep apnea presenting for evaluation of increased shortness of breath.  His symptoms started yesterday, he endorses getting significantly short of breath with exertion, was working outdoors yesterday in his yard at which time he became significantly winded.  EMS was called but he had normal oxygen saturations and he chose not to be transported here.  However he became more short of breath again this morning.  He denies fevers or chills, significant coughing.  He does report having "walking pneumonia" several weeks ago and completed a course of antibiotics.  He has intermittent episodes of shortness of breath and wheezing going on for several years and therefore he does have inhalers at home, never formally diagnosed with asthma.  However he did take several puffs of his albuterol this morning which transiently improved his symptoms.  He also endorses worse than normal bilateral lower extremity edema although he does state he has pretty significant edema but it is usually worse at the end of the day.  He had difficulty getting his shoes on this morning.  Does endorse orthopnea.  Denies chest pain.  The history is provided by the patient and the spouse.       Home Medications Prior to Admission medications   Medication Sig Start Date End Date Taking? Authorizing Provider  albuterol (PROVENTIL) (2.5 MG/3ML) 0.083% nebulizer solution INHALE CONTENTS OF 1 VIAL VIA NEBULIZER EVERY 6 HOURS AS NEEDED Patient taking differently: Take 2.5 mg by nebulization every 6 (six) hours as needed for wheezing or shortness of breath. 07/14/22  Yes Dettinger, Fransisca Kaufmann, MD  albuterol (VENTOLIN HFA)  108 (90 Base) MCG/ACT inhaler TAKE 2 PUFFS BY MOUTH EVERY 6 HOURS AS NEEDED FOR WHEEZE OR SHORTNESS OF BREATH Patient taking differently: Inhale 2 puffs into the lungs every 6 (six) hours as needed for wheezing or shortness of breath. 07/04/22  Yes Dettinger, Fransisca Kaufmann, MD  amLODipine (NORVASC) 10 MG tablet Take 1 tablet (10 mg total) by mouth daily. 03/29/22  Yes Dettinger, Fransisca Kaufmann, MD  atorvastatin (LIPITOR) 80 MG tablet Take 1 tablet (80 mg total) by mouth daily. 07/14/22  Yes Dettinger, Fransisca Kaufmann, MD  azelastine (ASTELIN) 0.1 % nasal spray Place 1 spray into both nostrils 2 (two) times daily. Use in each nostril as directed 11/17/21  Yes Dettinger, Fransisca Kaufmann, MD  bisoprolol (ZEBETA) 10 MG tablet Take 2 tablets (20 mg total) by mouth daily. 12/22/21  Yes Dettinger, Fransisca Kaufmann, MD  diclofenac (VOLTAREN) 50 MG EC tablet TAKE 1 TABLET BY MOUTH TWICE A DAY Patient taking differently: Take 50 mg by mouth 2 (two) times daily. 07/07/22  Yes Dettinger, Fransisca Kaufmann, MD  Dulaglutide (TRULICITY) 1.5 0000000 SOPN Inject 1.5 mg into the skin once a week. 03/29/22  Yes Dettinger, Fransisca Kaufmann, MD  fluticasone furoate-vilanterol (BREO ELLIPTA) 100-25 MCG/ACT AEPB Inhale 1 puff into the lungs daily. 03/29/22  Yes Dettinger, Fransisca Kaufmann, MD  guaiFENesin (MUCINEX) 600 MG 12 hr tablet Take 1 tablet (600 mg total) by mouth 2 (two) times daily. 02/11/22 02/11/23 Yes Kathie Dike, MD  hydrALAZINE (APRESOLINE) 50 MG tablet Take 1 tablet (50 mg total) by mouth 2 (two) times daily. 07/14/22  Yes  Dettinger, Elige Radon, MD  metFORMIN (GLUCOPHAGE) 1000 MG tablet Take 1 tablet (1,000 mg total) by mouth 2 (two) times daily with a meal. 03/29/22  Yes Dettinger, Elige Radon, MD  olmesartan (BENICAR) 40 MG tablet Take 1 tablet (40 mg total) by mouth daily. 10/13/21 10/13/22 Yes Nyoka Cowden, MD  sildenafil (REVATIO) 20 MG tablet 2-5 pills once daily as needed for erectile dysfunction Patient taking differently: Take 20 mg by mouth as needed (erectile  dysfunction). 07/07/19  Yes Dettinger, Elige Radon, MD  tamsulosin (FLOMAX) 0.4 MG CAPS capsule Take 0.4 mg by mouth daily. 06/21/21  Yes [provider]  doxycycline (VIBRA-TABS) 100 MG tablet Take 1 tablet (100 mg total) by mouth 2 (two) times daily. 1 po bid Patient not taking: Reported on 07/30/2022 07/14/22   Dettinger, Elige Radon, MD  fluticasone (FLONASE) 50 MCG/ACT nasal spray Place 2 sprays into both nostrils daily. Patient not taking: Reported on 07/30/2022 04/02/17   Dettinger, Elige Radon, MD  furosemide (LASIX) 20 MG tablet Take 1 tablet (20 mg total) by mouth daily as needed for fluid or edema. Patient not taking: Reported on 07/30/2022 02/11/22   Erick Blinks, MD  potassium chloride (MICRO-K) 10 MEQ CR capsule Take 1 capsule (10 mEq total) by mouth daily as needed. Patient not taking: Reported on 07/30/2022 02/11/22   Erick Blinks, MD  predniSONE (DELTASONE) 20 MG tablet 2 po at same time daily for 5 days Patient not taking: Reported on 07/30/2022 07/14/22   Dettinger, Elige Radon, MD      Allergies    Penicillins    Review of Systems   Review of Systems  Constitutional:  Negative for fever.  HENT:  Negative for congestion and sore throat.   Eyes: Negative.   Respiratory:  Positive for shortness of breath. Negative for chest tightness.   Cardiovascular:  Positive for leg swelling. Negative for chest pain and palpitations.  Gastrointestinal:  Negative for abdominal pain and nausea.  Genitourinary: Negative.   Musculoskeletal:  Negative for arthralgias, joint swelling and neck pain.  Skin: Negative.  Negative for rash and wound.  Neurological:  Negative for dizziness, weakness, light-headedness, numbness and headaches.  Psychiatric/Behavioral: Negative.      Physical Exam Updated Vital Signs BP (!) 156/79   Pulse 76   Temp 98.2 F (36.8 C) (Oral)   Resp 18   Ht 5\' 11"  (1.803 m)   Wt (!) 176.9 kg   SpO2 95%   BMI 54.39 kg/m  Physical Exam Vitals and nursing note reviewed.   Constitutional:      Appearance: He is well-developed.  HENT:     Head: Normocephalic and atraumatic.  Eyes:     Conjunctiva/sclera: Conjunctivae normal.  Cardiovascular:     Rate and Rhythm: Normal rate and regular rhythm.     Heart sounds: Normal heart sounds.  Pulmonary:     Effort: Pulmonary effort is normal.     Breath sounds: Decreased breath sounds present. No wheezing, rhonchi or rales.  Abdominal:     General: Bowel sounds are normal.     Palpations: Abdomen is soft.     Tenderness: There is no abdominal tenderness.  Musculoskeletal:        General: Normal range of motion.     Cervical back: Normal range of motion.     Right lower leg: Edema present.     Left lower leg: Edema present.  Skin:    General: Skin is warm and dry.  Neurological:  Mental Status: He is alert.     ED Results / Procedures / Treatments   Labs (all labs ordered are listed, but only abnormal results are displayed) Labs Reviewed  BASIC METABOLIC PANEL - Abnormal; Notable for the following components:      Result Value   Glucose, Bld 117 (*)    All other components within normal limits  CBC WITH DIFFERENTIAL/PLATELET - Abnormal; Notable for the following components:   WBC 12.5 (*)    MCH 25.6 (*)    RDW 17.2 (*)    Neutro Abs 10.5 (*)    All other components within normal limits  D-DIMER, QUANTITATIVE - Abnormal; Notable for the following components:   D-Dimer, Quant 0.66 (*)    All other components within normal limits  SARS CORONAVIRUS 2 BY RT PCR  BRAIN NATRIURETIC PEPTIDE  TROPONIN I (HIGH SENSITIVITY)    EKG EKG Interpretation  Date/Time:  Sunday July 30 2022 10:57:16 EDT Ventricular Rate:  70 PR Interval:  271 QRS Duration: 97 QT Interval:  389 QTC Calculation: 420 R Axis:   52 Text Interpretation: Sinus rhythm Prolonged PR interval Low voltage, precordial leads Confirmed by Milton Ferguson [phone]) on 07/30/2022 4:52:13 PM  Radiology CT Angio Chest PE W and/or Wo  Contrast  Result Date: 07/30/2022 CLINICAL DATA:  Evaluate for pulmonary embolus. Worsening shortness of breath. Positive D-dimer. EXAM: CT ANGIOGRAPHY CHEST WITH CONTRAST TECHNIQUE: Multidetector CT imaging of the chest was performed using the standard protocol during bolus administration of intravenous contrast. Multiplanar CT image reconstructions and MIPs were obtained to evaluate the vascular anatomy. RADIATION DOSE REDUCTION: This exam was performed according to the departmental dose-optimization program which includes automated exposure control, adjustment of the mA and/or kV according to patient size and/or use of iterative reconstruction technique. CONTRAST:  37mL OMNIPAQUE IOHEXOL 350 MG/ML SOLN COMPARISON:  02/09/2022 FINDINGS: Cardiovascular: Exam detail is diminished secondary to patient body habitus and suboptimal pulmonary arterial opacification. This is compounded by respiratory motion artifact involving the upper and lower lung zones. Within these limitations, there are no signs to suggest a central obstructing pulmonary embolus. Beyond the level of the main pulmonary arteries there is markedly diminished exam detail. The heart size is enlarged. Calcifications identified within the LAD coronary artery. No pericardial effusion. Mediastinum/Nodes: Thyroid gland, trachea and esophagus are unremarkable. No enlarged axillary lymph nodes. Prominent mediastinal lymph nodes are again noted in appears similar to the prior exam. For example, the subcarinal lymph node measures 1.8 cm, image 53/4. Unchanged from the previous exam. Low left paratracheal lymph node measures 1.5 cm, image 34/4. Also unchanged. Lungs/Pleura: There are small bilateral pleural effusions. Mild diffuse ground-glass attenuation is identified. Interlobular septal thickening noted within the lung bases. Focal area of air trapping noted within the medial right middle lobe. Upper Abdomen: No acute abnormality. Musculoskeletal: No acute or  suspicious osseous findings. Spondylosis identified within the thoracic spine. Review of the MIP images confirms the above findings. IMPRESSION: 1. Exam detail is markedly diminished secondary to patient body habitus, respiratory motion artifact and suboptimal pulmonary arterial opacification. Within these limitations, there are no signs to suggest a central obstructing pulmonary embolus. If there is a continued clinical concern for acute pulmonary embolus repeat imaging is recommended once the patient is clinically stable, able to remain motionless and breath hold for the duration of the exam. 2. Cardiac enlargement, bilateral pleural effusions and diffuse ground-glass attenuation suggestive of mild CHF. 3. Coronary artery calcifications. Electronically Signed   By: Lovena Le  Bradly Chris M.D.   On: 07/30/2022 15:27   DG Chest 2 View  Result Date: 07/30/2022 CLINICAL DATA:  Shortness of breath 2-3 years worse over the past few months. Worse with exertion. EXAM: CHEST - 2 VIEW COMPARISON:  02/22/2022 FINDINGS: Lungs are adequately inflated without focal airspace consolidation or effusion. Subtle prominence of the right infrahilar vessels unchanged cardiomediastinal silhouette and remainder of the exam is unchanged. IMPRESSION: No active cardiopulmonary disease. Electronically Signed   By: Elberta Fortis M.D.   On: 07/30/2022 11:16    Procedures Procedures    Medications Ordered in ED Medications  methylPREDNISolone sodium succinate (SOLU-MEDROL) 125 mg/2 mL injection 125 mg (125 mg Intravenous Given 07/30/22 1309)  ipratropium-albuterol (DUONEB) 0.5-2.5 (3) MG/3ML nebulizer solution 3 mL (3 mLs Nebulization Given 07/30/22 1321)  albuterol (PROVENTIL) (2.5 MG/3ML) 0.083% nebulizer solution 2.5 mg (2.5 mg Nebulization Given 07/30/22 1321)  iohexol (OMNIPAQUE) 350 MG/ML injection 75 mL (75 mLs Intravenous Contrast Given 07/30/22 1500)  furosemide (LASIX) injection 40 mg (40 mg Intravenous Given 07/30/22 1650)     ED Course/ Medical Decision Making/ A&P                           Medical Decision Making Patient with a history of morbid obesity, hypertension, sleep apnea, chronic peripheral edema and dyspnea on exertion presenting with worsening shortness of breath over the past 24 hours, history of respiratory failure, last admission for this 5 months ago in March.  Apparently he has been seen by pulmonology and cardiology with no clear indication for source of his symptoms.  He endorses nonproductive cough and wheezing, symptoms have not improved with home albuterol.  He has no wheezing on exam currently but does have decreased breath sounds throughout all lung fields along with significant lower extremity edema, no rales on exam.  His BNP is normal today however his D-dimer was elevated therefore he underwent CT angio which is negative for pulmonary embolism but does suggest lung findings compatible with mild CHF along with bilateral pleural effusions.    He was ambulated in the department after receiving Solu-Medrol and an albuterol and Atrovent neb treatment.  He became very dyspneic, felt weak with oxygen saturations dropping to 82%, denied chest pain.  He will be given IV Lasix and plan admission for further evaluation and management of his symptoms.  Amount and/or Complexity of Data Reviewed External Data Reviewed: labs and notes.    Details: Prior to admission notes reviewed. Labs: ordered.    Details: Per above Radiology: ordered.    Details: Per above ECG/medicine tests: ordered and independent interpretation performed.    Details: Normal sinus rhythm Discussion of management or test interpretation with external provider(s): Discussed with Dr. [name]  accepts patient for admission  Risk Prescription drug management. Decision regarding hospitalization.           Final Clinical Impression(s) / ED Diagnoses Final diagnoses:  Acute respiratory distress  Acute pulmonary edema  [facility])    Rx / DC Orders ED Discharge Orders     None         Victoriano Lain 07/30/22 1738    Gerhard Munch, MD 07/31/22 1651

## 2022-07-30 NOTE — ED Notes (Signed)
Received report from B Agricultural consultant. Pt resting with 02 2l [place]. No sob /resp distress noted at this time. Ble edema noted. Pt denies pain. Color wnl.

## 2022-07-31 ENCOUNTER — Other Ambulatory Visit (HOSPITAL_COMMUNITY): Payer: Self-pay | Admitting: *Deleted

## 2022-07-31 ENCOUNTER — Observation Stay (HOSPITAL_BASED_OUTPATIENT_CLINIC_OR_DEPARTMENT_OTHER): Payer: 59

## 2022-07-31 DIAGNOSIS — J9601 Acute respiratory failure with hypoxia: Secondary | ICD-10-CM

## 2022-07-31 DIAGNOSIS — I509 Heart failure, unspecified: Secondary | ICD-10-CM

## 2022-07-31 LAB — GLUCOSE, CAPILLARY
Glucose-Capillary: 205 mg/dL — ABNORMAL HIGH (ref 70–99)
Glucose-Capillary: 159 mg/dL — ABNORMAL HIGH (ref 70–99)
Glucose-Capillary: 175 mg/dL — ABNORMAL HIGH (ref 70–99)
Glucose-Capillary: 123 mg/dL — ABNORMAL HIGH (ref 70–99)
Glucose-Capillary: 131 mg/dL — ABNORMAL HIGH (ref 70–99)

## 2022-07-31 LAB — ECHOCARDIOGRAM COMPLETE
AR max vel: 2.77 cm2
AV Area VTI: 3.07 cm2
AV Area mean vel: 2.94 cm2
AV Mean grad: 9 mmHg
AV Peak grad: 16.6 mmHg
Ao pk vel: 2.04 m/s
Area-P 1/2: 3.08 cm2
Height: 71 in
S' Lateral: 3.35 cm
Weight: 6246.95 oz

## 2022-07-31 LAB — BASIC METABOLIC PANEL
Anion gap: 7 (ref 5–15)
BUN: 15 mg/dL (ref 6–20)
CO2: 26 mmol/L (ref 22–32)
Calcium: 9.1 mg/dL (ref 8.9–10.3)
Chloride: 106 mmol/L (ref 98–111)
Creatinine, Ser: 1.04 mg/dL (ref 0.61–1.24)
GFR, Estimated: >60 mL/min (ref >60)
Glucose, Bld: 166 mg/dL — ABNORMAL HIGH (ref 70–99)
Potassium: 4 mmol/L (ref 3.5–5.1)
Sodium: 139 mmol/L (ref 135–145)

## 2022-07-31 LAB — CBC
HCT: 44.3 % (ref 39.0–52.0)
Hemoglobin: 14 g/dL (ref 13.0–17.0)
MCH: 25.6 pg — ABNORMAL LOW (ref 26.0–34.0)
MCHC: 31.6 g/dL (ref 30.0–36.0)
MCV: 81 fL (ref 80.0–100.0)
Platelets: 303 10*3/uL (ref 150–400)
RBC: 5.47 MIL/uL (ref 4.22–5.81)
RDW: 17.2 % — ABNORMAL HIGH (ref 11.5–15.5)
WBC: 11.5 10*3/uL — ABNORMAL HIGH (ref 4.0–10.5)
nRBC: 0 % (ref 0.0–0.2)

## 2022-07-31 MED ORDER — LIVING BETTER WITH HEART FAILURE BOOK: Freq: Once | Status: AC

## 2022-07-31 MED ORDER — PERFLUTREN LIPID MICROSPHERE
1.0000 mL | INTRAVENOUS | Status: AC | PRN
  Administered 2022-07-31: 4 mL via INTRAVENOUS

## 2022-07-31 MED ORDER — SALINE SPRAY 0.65 % NA SOLN
1.0000 | NASAL | Status: DC | PRN
  Administered 2022-07-31: 1 via NASAL
  Filled 2022-07-31: qty 44

## 2022-07-31 NOTE — Progress Notes (Signed)
PROGRESS NOTE    Jason Meyer  BDZ:[id number] DOB: 1961-10-12 DOA: 07/30/2022 PCP: Dettinger, Elige Radon, MD   Brief Narrative:    Jason Meyer is a 61 y.o. male with medical history significant for diabetes mellitus, hypertension, obesity, OSA. Patient presented to the ED with complaints of difficulty breathing with activity that started yesterday.  He has been admitted with acute hypoxemic respiratory failure in the setting of acute diastolic CHF exacerbation.  He appears to be diuresing well on IV Lasix.  Assessment & Plan:   Principal Problem:   Acute respiratory failure with hypoxia (HCC) Active Problems:   Type 2 diabetes mellitus with other specified complication (HCC)   Hypertension associated with diabetes (HCC)   Morbid obesity (HCC)   Acute congestive heart failure (HCC)  Assessment and Plan: * Acute respiratory failure with hypoxia (HCC) O2 sats down to 86% on room air, placed on 2 L.  Likely due to CHF.  Recent diagnosis of OSA, reports compliance with CPAP.  Not on home O2.  Acute congestive heart failure (HCC) As yet unspecified type.  Reports bilateral lower extremity swelling, with dyspnea and hypoxia, chest CT suggesting mild congestive heart failure.  Per Chart- cant tell if any significant recent weight changes per chart.  BNP 61, likely not reflective of volume status due to habitus.  Unable to tell volume status due to body habitus.  Last echo 08/2021 EF of 60 to 65%, moderate LVH.  Started on 20mg  PRN Lasix 02/2022 for lymphedema. -IV Lasix 40 twice daily -Strict input output, daily weights, daily BMP -Updated echocardiogram  Morbid obesity (HCC) BMI 54.  Encourage weight loss diet and exercise.  Hypertension associated with diabetes (HCC) Systolic 130s to 03/2022. -Resume bisoprolol, hydralazine and ARB, tamsulosin -Hold Norvasc for now as I am unsure of compliance with his medications   Type 2 diabetes mellitus with other specified complication  (HCC) Controlled, A1c 5.9.   - SSI- S -Hold Trulicity, metformin   DVT prophylaxis:Lovenox Code Status: Full Family Communication: Wife at bedside 8/21 Disposition Plan:  Status is: Observation The patient will require care spanning > 2 midnights and should be moved to inpatient because: Need for IV Lasix.  Consultants:  None  Procedures:  None  Antimicrobials:  None   Subjective: Patient seen and evaluated today with no new acute complaints or concerns. No acute concerns or events noted overnight.  He continues to diurese well on IV Lasix.  Objective: Vitals:   07/31/22 0157 07/31/22 0500 07/31/22 0600 07/31/22 0742  BP: 138/76 (!) 144/81    Pulse: 73 73    Resp: 18 18    Temp: 98.6 F (37 C) 98.5 F (36.9 C)    TempSrc:  Oral    SpO2: 93% 94%  95%  Weight:   (!) 177.1 kg   Height:        Intake/Output Summary (Last 24 hours) at 07/31/2022 1154 Last data filed at 07/30/2022 2000 Gross per 24 hour  Intake --  Output 1600 ml  Net -1600 ml   Filed Weights   07/30/22 1043 07/30/22 1700 07/31/22 0600  Weight: (!) 176.9 kg (!) 178.4 kg (!) 177.1 kg    Examination:  General exam: Appears calm and comfortable, obese Respiratory system: Clear to auscultation. Respiratory effort normal. Cardiovascular system: S1 & S2 heard, RRR.  Gastrointestinal system: Abdomen is soft Central nervous system: Alert and awake Extremities: Persistent lower extremity edema Skin: No significant lesions noted Psychiatry: Flat affect.  Data Reviewed: I have personally reviewed following labs and imaging studies  CBC: Recent Labs  Lab 07/30/22 1117 07/31/22 0547  WBC 12.5* 11.5*  NEUTROABS 10.5*  --   HGB 14.4 14.0  HCT 45.3 44.3  MCV 80.6 81.0  PLT 269 303   Basic Metabolic Panel: Recent Labs  Lab 07/30/22 1117 07/31/22 0547  NA 140 139  K 4.0 4.0  CL 108 106  CO2 25 26  GLUCOSE 117* 166*  BUN 12 15  CREATININE 1.00 1.04  CALCIUM 8.9 9.1    GFR: Estimated Creatinine Clearance: 125.5 mL/min (by C-G formula based on SCr of 1.04 mg/dL). Liver Function Tests: No results for input(s): "AST", "ALT", "ALKPHOS", "BILITOT", "PROT", "ALBUMIN" in the last 168 hours. No results for input(s): "LIPASE", "AMYLASE" in the last 168 hours. No results for input(s): "AMMONIA" in the last 168 hours. Coagulation Profile: No results for input(s): "INR", "PROTIME" in the last 168 hours. Cardiac Enzymes: No results for input(s): "CKTOTAL", "CKMB", "CKMBINDEX", "TROPONINI" in the last 168 hours. BNP (last 3 results) No results for input(s): "PROBNP" in the last 8760 hours. HbA1C: No results for input(s): "HGBA1C" in the last 72 hours. CBG: Recent Labs  Lab 07/30/22 2216 07/31/22 0746 07/31/22 1142  GLUCAP 209* 205* 159*   Lipid Profile: No results for input(s): "CHOL", "HDL", "LDLCALC", "TRIG", "CHOLHDL", "LDLDIRECT" in the last 72 hours. Thyroid Function Tests: No results for input(s): "TSH", "T4TOTAL", "FREET4", "T3FREE", "THYROIDAB" in the last 72 hours. Anemia Panel: No results for input(s): "VITAMINB12", "FOLATE", "FERRITIN", "TIBC", "IRON", "RETICCTPCT" in the last 72 hours. Sepsis Labs: No results for input(s): "PROCALCITON", "LATICACIDVEN" in the last 168 hours.  Recent Results (from the past 240 hour(s))  SARS Coronavirus 2 by RT PCR (hospital order, performed in [facility] hospital lab) *cepheid single result test* Anterior Nasal Swab     Status: None   Collection Time: 07/30/22 11:28 AM   Specimen: Anterior Nasal Swab  Result Value Ref Range Status   SARS Coronavirus 2 by RT PCR NEGATIVE NEGATIVE Final    Comment: (NOTE) SARS-CoV-2 target nucleic acids are NOT DETECTED.  The SARS-CoV-2 RNA is generally detectable in upper and lower respiratory specimens during the acute phase of infection. The lowest concentration of SARS-CoV-2 viral copies this assay can detect is 250 copies / mL. A negative result does not preclude  SARS-CoV-2 infection and should not be used as the sole basis for treatment or other patient management decisions.  A negative result may occur with improper specimen collection / handling, submission of specimen other than nasopharyngeal swab, presence of viral mutation(s) within the areas targeted by this assay, and inadequate number of viral copies (<250 copies / mL). A negative result must be combined with clinical observations, patient history, and epidemiological information.  Fact Sheet for Patients:   RoadLapTop.co.za  Fact Sheet for Healthcare Providers: http://kim-miller.com/  This test is not yet approved or  cleared by the [place] FDA and has been authorized for detection and/or diagnosis of SARS-CoV-2 by FDA under an Emergency Use Authorization (EUA).  This EUA will remain in effect (meaning this test can be used) for the duration of the COVID-19 declaration under Section 564(b)(1) of the Act, 21 U.S.C. section 360bbb-3(b)(1), unless the authorization is terminated or revoked sooner.  Performed at [facility], [address], [place], [place] [address]          Radiology Studies: CT Angio Chest PE W and/or Wo Contrast  Result Date: 07/30/2022 CLINICAL DATA:  Evaluate for pulmonary embolus. Worsening shortness of breath. Positive D-dimer. EXAM: CT ANGIOGRAPHY CHEST WITH CONTRAST TECHNIQUE: Multidetector CT imaging of the chest was performed using the standard protocol during bolus administration of intravenous contrast. Multiplanar CT image reconstructions and MIPs were obtained to evaluate the vascular anatomy. RADIATION DOSE REDUCTION: This exam was performed according to the departmental dose-optimization program which includes automated exposure control, adjustment of the mA and/or kV according to patient size and/or use of iterative reconstruction technique. CONTRAST:  42mL OMNIPAQUE IOHEXOL 350 MG/ML SOLN  COMPARISON:  02/09/2022 FINDINGS: Cardiovascular: Exam detail is diminished secondary to patient body habitus and suboptimal pulmonary arterial opacification. This is compounded by respiratory motion artifact involving the upper and lower lung zones. Within these limitations, there are no signs to suggest a central obstructing pulmonary embolus. Beyond the level of the main pulmonary arteries there is markedly diminished exam detail. The heart size is enlarged. Calcifications identified within the LAD coronary artery. No pericardial effusion. Mediastinum/Nodes: Thyroid gland, trachea and esophagus are unremarkable. No enlarged axillary lymph nodes. Prominent mediastinal lymph nodes are again noted in appears similar to the prior exam. For example, the subcarinal lymph node measures 1.8 cm, image 53/4. Unchanged from the previous exam. Low left paratracheal lymph node measures 1.5 cm, image 34/4. Also unchanged. Lungs/Pleura: There are small bilateral pleural effusions. Mild diffuse ground-glass attenuation is identified. Interlobular septal thickening noted within the lung bases. Focal area of air trapping noted within the medial right middle lobe. Upper Abdomen: No acute abnormality. Musculoskeletal: No acute or suspicious osseous findings. Spondylosis identified within the thoracic spine. Review of the MIP images confirms the above findings. IMPRESSION: 1. Exam detail is markedly diminished secondary to patient body habitus, respiratory motion artifact and suboptimal pulmonary arterial opacification. Within these limitations, there are no signs to suggest a central obstructing pulmonary embolus. If there is a continued clinical concern for acute pulmonary embolus repeat imaging is recommended once the patient is clinically stable, able to remain motionless and breath hold for the duration of the exam. 2. Cardiac enlargement, bilateral pleural effusions and diffuse ground-glass attenuation suggestive of mild CHF.  3. Coronary artery calcifications. Electronically Signed   By: Signa Kell M.D.   On: 07/30/2022 15:27   DG Chest 2 View  Result Date: 07/30/2022 CLINICAL DATA:  Shortness of breath 2-3 years worse over the past few months. Worse with exertion. EXAM: CHEST - 2 VIEW COMPARISON:  02/22/2022 FINDINGS: Lungs are adequately inflated without focal airspace consolidation or effusion. Subtle prominence of the right infrahilar vessels unchanged cardiomediastinal silhouette and remainder of the exam is unchanged. IMPRESSION: No active cardiopulmonary disease. Electronically Signed   By: Elberta Fortis M.D.   On: 07/30/2022 11:16        Scheduled Meds:  atorvastatin  80 mg Oral q1800   bisoprolol  20 mg Oral Daily   enoxaparin (LOVENOX) injection  90 mg Subcutaneous Q24H   fluticasone furoate-vilanterol  1 puff Inhalation Daily   furosemide  40 mg Intravenous Q12H   hydrALAZINE  50 mg Oral BID   insulin aspart  0-15 Units Subcutaneous TID WC   insulin aspart  0-5 Units Subcutaneous QHS   tamsulosin  0.4 mg Oral Daily    LOS: 0 days    Time spent: 35 minutes    Lyrik Buresh Hoover Brunette, DO [facility]  If 7PM-7AM, please contact night-coverage [email or web address] 07/31/2022, 11:54 AM

## 2022-07-31 NOTE — TOC Progression Note (Signed)
  Transition of Care [facility]) Screening Note   Patient Details  Name: Tameron Lama Epple Date of Birth: [birth year]   Transition of Care [facility]) CM/SW Contact:    Leitha Bleak, RN Phone Number: 07/31/2022, 3:18 PM  PT eval pending/  CHF book ordered.  Transition of Care Department [facility]) has reviewed patient and no TOC needs have been identified at this time. We will continue to monitor patient advancement through interdisciplinary progression rounds. If new patient transition needs arise, please place a TOC consult.    Expected Discharge Plan: Home/Self Care Barriers to Discharge: Continued Medical Work up  Expected Discharge Plan and Services Expected Discharge Plan: Home/Self Care       Living arrangements for the past 2 months: Single Family Home

## 2022-07-31 NOTE — Progress Notes (Signed)
   07/31/22 1627  ReDS Vest / Clip  Station Marker D  Ruler Value 47  ReDS Value Range (!) > 40  ReDS Actual Value 34

## 2022-07-31 NOTE — Progress Notes (Signed)
*  PRELIMINARY RESULTS* Echocardiogram 2D Echocardiogram has been performed with Definity.  Stacey Drain 07/31/2022, 1:28 PM

## 2022-08-01 DIAGNOSIS — J9601 Acute respiratory failure with hypoxia: Secondary | ICD-10-CM | POA: Diagnosis not present

## 2022-08-01 LAB — BASIC METABOLIC PANEL
Anion gap: 7 (ref 5–15)
BUN: 17 mg/dL (ref 6–20)
CO2: 25 mmol/L (ref 22–32)
Calcium: 8.8 mg/dL — ABNORMAL LOW (ref 8.9–10.3)
Chloride: 108 mmol/L (ref 98–111)
Creatinine, Ser: 1.2 mg/dL (ref 0.61–1.24)
GFR, Estimated: >60 mL/min (ref >60)
Glucose, Bld: 129 mg/dL — ABNORMAL HIGH (ref 70–99)
Potassium: 3.4 mmol/L — ABNORMAL LOW (ref 3.5–5.1)
Sodium: 140 mmol/L (ref 135–145)

## 2022-08-01 LAB — CBC
HCT: 43.6 % (ref 39.0–52.0)
Hemoglobin: 13.7 g/dL (ref 13.0–17.0)
MCH: 25.6 pg — ABNORMAL LOW (ref 26.0–34.0)
MCHC: 31.4 g/dL (ref 30.0–36.0)
MCV: 81.3 fL (ref 80.0–100.0)
Platelets: 298 10*3/uL (ref 150–400)
RBC: 5.36 MIL/uL (ref 4.22–5.81)
RDW: 17.1 % — ABNORMAL HIGH (ref 11.5–15.5)
WBC: 12.3 10*3/uL — ABNORMAL HIGH (ref 4.0–10.5)
nRBC: 0 % (ref 0.0–0.2)

## 2022-08-01 LAB — GLUCOSE, CAPILLARY
Glucose-Capillary: 137 mg/dL — ABNORMAL HIGH (ref 70–99)
Glucose-Capillary: 121 mg/dL — ABNORMAL HIGH (ref 70–99)

## 2022-08-01 LAB — MAGNESIUM: Magnesium: 2.2 mg/dL (ref 1.7–2.4)

## 2022-08-01 MED ORDER — POTASSIUM CHLORIDE CRYS ER 20 MEQ PO TBCR
40.0000 meq | EXTENDED_RELEASE_TABLET | Freq: Once | ORAL | Status: AC
  Administered 2022-08-01: 40 meq via ORAL
  Filled 2022-08-01: qty 2

## 2022-08-01 MED ORDER — POTASSIUM CHLORIDE ER 10 MEQ PO CPCR: 10.0000 meq | ORAL_CAPSULE | Freq: Every day | ORAL | 3 refills | Status: DC

## 2022-08-01 MED ORDER — FUROSEMIDE 20 MG PO TABS: 20.0000 mg | ORAL_TABLET | Freq: Every day | ORAL | 3 refills | Status: DC

## 2022-08-01 NOTE — TOC Transition Note (Signed)
Transition of Care [facility]) - CM/SW Discharge Note   Patient Details  Name: Jason Meyer MRN: [id number] Date of Birth: 1961-03-04  Transition of Care [facility]) CM/SW Contact:  Villa Herb, LCSWA Phone Number: 08/01/2022, 12:58 PM   Clinical Narrative:    [facility] consulted for CHF screen. CSW spoke with pt in room to complete assessment. Pt states he lives with his wife. Pt is independent in completing his ADLs and is able to drive when needed. Pt states that he has not had HH services nor does he use any DME.   Pt states that he was educated on weighing daily for CHF. CSW went over this again with pt. Pt states that he does not follow a heart health diet. CSW educated pt ion importance of low fat and salt diet. Pt states that he tries to take medications as prescribed but does not always do this. TOC signing off.   Final next level of care: Home/Self Care Barriers to Discharge: No Barriers Identified   Patient Goals and CMS Choice Patient states their goals for this hospitalization and ongoing recovery are:: return home CMS Medicare.gov Compare Post Acute Care list provided to:: Patient Choice offered to / list presented to : Patient  Discharge Placement                       Discharge Plan and Services                                     Social Determinants of Health (SDOH) Interventions     Readmission Risk Interventions     No data to display

## 2022-08-01 NOTE — Discharge Summary (Signed)
Physician Discharge Summary  Tarron Krolak Arns ZOX:[id number] DOB: May 16, 1961 DOA: 07/30/2022  PCP: Dettinger, Elige Radon, MD  Admit date: 07/30/2022  Discharge date: 08/01/2022  Admitted From:Home  Disposition:  Home  Recommendations for Outpatient Follow-up:  Follow up with PCP in 1-2 weeks Referral sent to cardiology office for close follow-up to see Dr. Diona Browner in [place] Continue now on Lasix 20 mg daily and use extra dose as needed Continue on other home medications as prior  Home Health: None  Equipment/Devices: None  Discharge Condition:Stable  CODE STATUS: Full  Diet recommendation: Heart Healthy/carb modified  Brief/Interim Summary: Jason Meyer is a 61 y.o. male with medical history significant for diabetes mellitus, hypertension, obesity, OSA. Patient presented to the ED with complaints of difficulty breathing with activity that started yesterday.  He has been admitted with acute hypoxemic respiratory failure in the setting of acute diastolic CHF exacerbation.  His hypoxemia quickly resolved as well as his volume overload status and heart failure.  He was briefly diuresed and otherwise stated that he felt well and back to his usual baseline.  His Reds clip score was 34 which indicated that he was adequately diuresed and he does appear stable for discharge.  2D echocardiogram with LVEF 60-65% with no other acute abnormalities.  He has now been instructed to remain on Lasix 20 mg daily and use an extra dose as needed for weight gain or symptoms.  He will need close follow-up with cardiology outpatient.  Discharge Diagnoses:  Principal Problem:   Acute respiratory failure with hypoxia (HCC) Active Problems:   Type 2 diabetes mellitus with other specified complication (HCC)   Hypertension associated with diabetes (HCC)   Morbid obesity (HCC)   Acute congestive heart failure (HCC)  Principal discharge diagnosis: Acute hypoxemic respiratory failure secondary to acute  diastolic CHF exacerbation.  Discharge Instructions  Discharge Instructions     (HEART FAILURE PATIENTS) Call MD:  Anytime you have any of the following symptoms: 1) 3 pound weight gain in 24 hours or 5 pounds in 1 week 2) shortness of breath, with or without a dry hacking cough 3) swelling in the hands, feet or stomach 4) if you have to sleep on extra pillows at night in order to breathe.   Complete by: As directed    Ambulatory referral to Cardiology   Complete by: As directed    Diet - low sodium heart healthy   Complete by: As directed    Increase activity slowly   Complete by: As directed       Allergies as of 08/01/2022       Reactions   Penicillins Swelling        Medication List     STOP taking these medications    doxycycline 100 MG tablet Commonly known as: VIBRA-TABS   predniSONE 20 MG tablet Commonly known as: DELTASONE       TAKE these medications    albuterol 108 (90 Base) MCG/ACT inhaler Commonly known as: VENTOLIN HFA TAKE 2 PUFFS BY MOUTH EVERY 6 HOURS AS NEEDED FOR WHEEZE OR SHORTNESS OF BREATH What changed: See the new instructions.   albuterol (2.5 MG/3ML) 0.083% nebulizer solution Commonly known as: PROVENTIL INHALE CONTENTS OF 1 VIAL VIA NEBULIZER EVERY 6 HOURS AS NEEDED What changed:  how much to take how to take this when to take this reasons to take this additional instructions   amLODipine 10 MG tablet Commonly known as: NORVASC Take 1 tablet (10 mg total) by mouth daily.  atorvastatin 80 MG tablet Commonly known as: LIPITOR Take 1 tablet (80 mg total) by mouth daily.   azelastine 0.1 % nasal spray Commonly known as: ASTELIN Place 1 spray into both nostrils 2 (two) times daily. Use in each nostril as directed   bisoprolol 10 MG tablet Commonly known as: ZEBETA Take 2 tablets (20 mg total) by mouth daily.   diclofenac 50 MG EC tablet Commonly known as: VOLTAREN TAKE 1 TABLET BY MOUTH TWICE A DAY   fluticasone 50  MCG/ACT nasal spray Commonly known as: FLONASE Place 2 sprays into both nostrils daily.   fluticasone furoate-vilanterol 100-25 MCG/ACT Aepb Commonly known as: BREO ELLIPTA Inhale 1 puff into the lungs daily.   furosemide 20 MG tablet Commonly known as: LASIX Take 1 tablet (20 mg total) by mouth daily. Take extra 20mg  for 3 pound weight gain in 24 hours, or swelling or shortness of breath What changed:  when to take this reasons to take this additional instructions   guaiFENesin 600 MG 12 hr tablet Commonly known as: Mucinex Take 1 tablet (600 mg total) by mouth 2 (two) times daily.   hydrALAZINE 50 MG tablet Commonly known as: APRESOLINE Take 1 tablet (50 mg total) by mouth 2 (two) times daily.   metFORMIN 1000 MG tablet Commonly known as: GLUCOPHAGE Take 1 tablet (1,000 mg total) by mouth 2 (two) times daily with a meal.   olmesartan 40 MG tablet Commonly known as: BENICAR Take 1 tablet (40 mg total) by mouth daily.   potassium chloride 10 MEQ CR capsule Commonly known as: MICRO-K Take 1 capsule (10 mEq total) by mouth daily. What changed:  when to take this reasons to take this   sildenafil 20 MG tablet Commonly known as: Revatio 2-5 pills once daily as needed for erectile dysfunction What changed:  how much to take how to take this when to take this reasons to take this additional instructions   tamsulosin 0.4 MG Caps capsule Commonly known as: FLOMAX Take 0.4 mg by mouth daily.   Trulicity 1.5 MG/0.5ML Sopn Generic drug: Dulaglutide Inject 1.5 mg into the skin once a week.        Follow-up Information     Dettinger, , MD. Schedule an appointment as soon as possible for a visit in 1 week(s).   Specialties: Family Medicine, Cardiology Contact information: [address] [place] [phone]         [phone], MD. Go to.   Specialty: Cardiology Contact information: [address] [place]  [place] [phone]                Allergies  Allergen Reactions   Penicillins Swelling    Consultations: None   Procedures/Studies: ECHOCARDIOGRAM COMPLETE  Result Date: 07/31/2022    ECHOCARDIOGRAM REPORT   Patient Name:   [name] Date of Exam: 07/31/2022 Medical Rec #:  08/02/2022        Height:       71.0 in Accession #:    [id number]       Weight:       390.4 lb Date of Birth:  1961-09-22       BSA:          2.802 m Patient Age:    60 years         BP:           144/81 mmHg Patient Gender: M  HR:           73 bpm. [facility] Procedure: 2D Echo, Cardiac Doppler and Color Doppler Indications:    Congestive Heart Failure I50.9  History:        Patient has prior history of Echocardiogram examinations, most                 recent 09/06/2021. Risk Factors:Hypertension, Diabetes and                 Dyslipidemia. Morbid Obesity.  Sonographer:    Celesta Gentile RCS Referring Phys: [phone] [facility]ch[facility]  Sonographer Comments: Image acquisition challenging due to patient body habitus. IMPRESSIONS  1. Left ventricular ejection fraction, by estimation, is 60 to 65%. The left ventricle has normal function. The left ventricle has no regional wall motion abnormalities. There is moderate left ventricular hypertrophy. Left ventricular diastolic parameters were normal.  2. Right ventricular systolic function was not well visualized. The right ventricular size is not well visualized. Tricuspid regurgitation signal is inadequate for assessing PA pressure.  3. The mitral valve was not well visualized. No evidence of mitral valve regurgitation. No evidence of mitral stenosis.  4. The aortic valve was not well visualized. Aortic valve regurgitation is not visualized. No aortic stenosis is present.  5. The inferior vena cava is dilated in size with >50% respiratory variability, suggesting right atrial pressure of 8 mmHg. FINDINGS  Left Ventricle: Left ventricular ejection  fraction, by estimation, is 60 to 65%. The left ventricle has normal function. The left ventricle has no regional wall motion abnormalities. Definity contrast agent was given IV to delineate the left ventricular  endocardial borders. The left ventricular internal cavity size was normal in size. There is moderate left ventricular hypertrophy. Left ventricular diastolic parameters were normal. Right Ventricle: The right ventricular size is not well visualized. Right vetricular wall thickness was not well visualized. Right ventricular systolic function was not well visualized. Tricuspid regurgitation signal is inadequate for assessing PA pressure. Left Atrium: Left atrial size was not well visualized. Right Atrium: Right atrial size was not well visualized. Pericardium: There is no evidence of pericardial effusion. Mitral Valve: The mitral valve was not well visualized. No evidence of mitral valve regurgitation. No evidence of mitral valve stenosis. Tricuspid Valve: The tricuspid valve is not well visualized. Tricuspid valve regurgitation is not demonstrated. No evidence of tricuspid stenosis. Aortic Valve: The aortic valve was not well visualized. Aortic valve regurgitation is not visualized. No aortic stenosis is present. Aortic valve mean gradient measures 9.0 mmHg. Aortic valve peak gradient measures 16.6 mmHg. Aortic valve area, by VTI measures 3.07 cm. Pulmonic Valve: The pulmonic valve was not well visualized. Pulmonic valve regurgitation is not visualized. No evidence of pulmonic stenosis. Aorta: The aortic root is normal in size and structure. Venous: The inferior vena cava is dilated in size with greater than 50% respiratory variability, suggesting right atrial pressure of 8 mmHg. IAS/Shunts: No atrial level shunt detected by color flow Doppler.  LEFT VENTRICLE PLAX 2D LVIDd:         5.20 cm   Diastology LVIDs:         3.35 cm   LV e' medial:    10.20 cm/s LV PW:         1.35 cm   LV E/e' medial:  10.1 LV  IVS:        1.40 cm   LV e' lateral:   11.90 cm/s LVOT diam:  2.30 cm   LV E/e' lateral: 8.7 LV SV:         132 LV SV Index:   47 LVOT Area:     4.15 cm  RIGHT VENTRICLE RV S prime:     11.60 cm/s TAPSE (M-mode): 3.0 cm LEFT ATRIUM             Index LA diam:        3.75 cm 1.34 cm/m LA Vol (A2C):   87.6 ml 31.26 ml/m LA Vol (A4C):   84.2 ml 30.05 ml/m LA Biplane Vol: 91.4 ml 32.62 ml/m  AORTIC VALVE AV Area (Vmax):    2.77 cm AV Area (Vmean):   2.94 cm AV Area (VTI):     3.07 cm AV Vmax:           204.00 cm/s AV Vmean:          132.000 cm/s AV VTI:            0.431 m AV Peak Grad:      16.6 mmHg AV Mean Grad:      9.0 mmHg LVOT Vmax:         136.00 cm/s LVOT Vmean:        93.400 cm/s LVOT VTI:          0.318 m LVOT/AV VTI ratio: 0.74  AORTA Ao Root diam: 3.30 cm MITRAL VALVE MV Area (PHT): 3.08 cm     SHUNTS MV Decel Time: 246 msec     Systemic VTI:  0.32 m MV E velocity: 103.00 cm/s  Systemic Diam: 2.30 cm MV A velocity: 93.80 cm/s MV E/A ratio:  1.10 Dina Rich MD Electronically signed by Dina Rich MD Signature Date/Time: 07/31/2022/1:39:37 PM    Final    CT Angio Chest PE W and/or Wo Contrast  Result Date: 07/30/2022 CLINICAL DATA:  Evaluate for pulmonary embolus. Worsening shortness of breath. Positive D-dimer. EXAM: CT ANGIOGRAPHY CHEST WITH CONTRAST TECHNIQUE: Multidetector CT imaging of the chest was performed using the standard protocol during bolus administration of intravenous contrast. Multiplanar CT image reconstructions and MIPs were obtained to evaluate the vascular anatomy. RADIATION DOSE REDUCTION: This exam was performed according to the departmental dose-optimization program which includes automated exposure control, adjustment of the mA and/or kV according to patient size and/or use of iterative reconstruction technique. CONTRAST:  76mL OMNIPAQUE IOHEXOL 350 MG/ML SOLN COMPARISON:  02/09/2022 FINDINGS: Cardiovascular: Exam detail is diminished secondary to patient body  habitus and suboptimal pulmonary arterial opacification. This is compounded by respiratory motion artifact involving the upper and lower lung zones. Within these limitations, there are no signs to suggest a central obstructing pulmonary embolus. Beyond the level of the main pulmonary arteries there is markedly diminished exam detail. The heart size is enlarged. Calcifications identified within the LAD coronary artery. No pericardial effusion. Mediastinum/Nodes: Thyroid gland, trachea and esophagus are unremarkable. No enlarged axillary lymph nodes. Prominent mediastinal lymph nodes are again noted in appears similar to the prior exam. For example, the subcarinal lymph node measures 1.8 cm, image 53/4. Unchanged from the previous exam. Low left paratracheal lymph node measures 1.5 cm, image 34/4. Also unchanged. Lungs/Pleura: There are small bilateral pleural effusions. Mild diffuse ground-glass attenuation is identified. Interlobular septal thickening noted within the lung bases. Focal area of air trapping noted within the medial right middle lobe. Upper Abdomen: No acute abnormality. Musculoskeletal: No acute or suspicious osseous findings. Spondylosis identified within the thoracic spine. Review of the MIP images confirms the above  findings. IMPRESSION: 1. Exam detail is markedly diminished secondary to patient body habitus, respiratory motion artifact and suboptimal pulmonary arterial opacification. Within these limitations, there are no signs to suggest a central obstructing pulmonary embolus. If there is a continued clinical concern for acute pulmonary embolus repeat imaging is recommended once the patient is clinically stable, able to remain motionless and breath hold for the duration of the exam. 2. Cardiac enlargement, bilateral pleural effusions and diffuse ground-glass attenuation suggestive of mild CHF. 3. Coronary artery calcifications. Electronically Signed   By: Signa Kell M.D.   On: 07/30/2022  15:27   DG Chest 2 View  Result Date: 07/30/2022 CLINICAL DATA:  Shortness of breath 2-3 years worse over the past few months. Worse with exertion. EXAM: CHEST - 2 VIEW COMPARISON:  02/22/2022 FINDINGS: Lungs are adequately inflated without focal airspace consolidation or effusion. Subtle prominence of the right infrahilar vessels unchanged cardiomediastinal silhouette and remainder of the exam is unchanged. IMPRESSION: No active cardiopulmonary disease. Electronically Signed   By: Elberta Fortis M.D.   On: 07/30/2022 11:16     Discharge Exam: Vitals:   08/01/22 0500 08/01/22 0726  BP: 132/72   Pulse: 63   Resp: 18   Temp: 97.7 F (36.5 C)   SpO2: 95% 95%   Vitals:   07/31/22 2000 08/01/22 0452 08/01/22 0500 08/01/22 0726  BP: 133/72  132/72   Pulse: 63  63   Resp: 18  18   Temp: 98.1 F (36.7 C)  97.7 F (36.5 C)   TempSrc: Oral  Oral   SpO2: 94%  95% 95%  Weight:  (!) 174.4 kg    Height:  5\' 11"  (1.803 m)      General: Pt is alert, awake, not in acute distress, obese Cardiovascular: RRR, S1/S2 +, no rubs, no gallops Respiratory: CTA bilaterally, no wheezing, no rhonchi Abdominal: Soft, NT, ND, bowel sounds + Extremities: no edema, no cyanosis    The results of significant diagnostics from this hospitalization (including imaging, microbiology, ancillary and laboratory) are listed below for reference.     Microbiology: Recent Results (from the past 240 hour(s))  SARS Coronavirus 2 by RT PCR (hospital order, performed in [facility] hospital lab) *cepheid single result test* Anterior Nasal Swab     Status: None   Collection Time: 07/30/22 11:28 AM   Specimen: Anterior Nasal Swab  Result Value Ref Range Status   SARS Coronavirus 2 by RT PCR NEGATIVE NEGATIVE Final    Comment: (NOTE) SARS-CoV-2 target nucleic acids are NOT DETECTED.  The SARS-CoV-2 RNA is generally detectable in upper and lower respiratory specimens during the acute phase of infection. The  lowest concentration of SARS-CoV-2 viral copies this assay can detect is 250 copies / mL. A negative result does not preclude SARS-CoV-2 infection and should not be used as the sole basis for treatment or other patient management decisions.  A negative result may occur with improper specimen collection / handling, submission of specimen other than nasopharyngeal swab, presence of viral mutation(s) within the areas targeted by this assay, and inadequate number of viral copies (<250 copies / mL). A negative result must be combined with clinical observations, patient history, and epidemiological information.  Fact Sheet for Patients:   08/01/22  Fact Sheet for Healthcare Providers: RoadLapTop.co.za  This test is not yet approved or  cleared by the http://kim-miller.com/ FDA and has been authorized for detection and/or diagnosis of SARS-CoV-2 by FDA under an Emergency Use Authorization (EUA).  This  EUA will remain in effect (meaning this test can be used) for the duration of the COVID-19 declaration under Section 564(b)(1) of the Act, 21 U.S.C. section 360bbb-3(b)(1), unless the authorization is terminated or revoked sooner.  Performed at [facility], [address] [place], [place] [address]      Labs: BNP (last 3 results) Recent Labs    11/18/21 1205 02/09/22 1955 07/30/22 1117  BNP 36.0 60.0 61.0   Basic Metabolic Panel: Recent Labs  Lab 07/30/22 1117 07/31/22 0547 08/01/22 0437  NA 140 139 140  K 4.0 4.0 3.4*  CL 108 106 108  CO2 GLUCOSE 117* 166* 129*  BUN CREATININE 1.00 1.04 1.20  CALCIUM 8.9 9.1 8.8*  MG  --   --  2.2   Liver Function Tests: No results for input(s): "AST", "ALT", "ALKPHOS", "BILITOT", "PROT", "ALBUMIN" in the last 168 hours. No results for input(s): "LIPASE", "AMYLASE" in the last 168 hours. No results for input(s): "AMMONIA" in the last 168 hours. CBC: Recent Labs   Lab 07/30/22 1117 07/31/22 0547 08/01/22 0437  WBC 12.5* 11.5* 12.3*  NEUTROABS 10.5*  --   --   HGB 14.4 14.0 13.7  HCT 45.3 44.3 43.6  MCV 80.6 81.0 81.3  PLT 269 303 298   Cardiac Enzymes: No results for input(s): "CKTOTAL", "CKMB", "CKMBINDEX", "TROPONINI" in the last 168 hours. BNP: Invalid input(s): "POCBNP" CBG: Recent Labs  Lab 07/31/22 1142 07/31/22 1651 07/31/22 2131 07/31/22 2212 08/01/22 0718  GLUCAP 159* 175* 123* 131* 137*   D-Dimer Recent Labs    07/30/22 1117  DDIMER 0.66*   Hgb A1c No results for input(s): "HGBA1C" in the last 72 hours. Lipid Profile No results for input(s): "CHOL", "HDL", "LDLCALC", "TRIG", "CHOLHDL", "LDLDIRECT" in the last 72 hours. Thyroid function studies No results for input(s): "TSH", "T4TOTAL", "T3FREE", "THYROIDAB" in the last 72 hours.  Invalid input(s): "FREET3" Anemia work up No results for input(s): "VITAMINB12", "FOLATE", "FERRITIN", "TIBC", "IRON", "RETICCTPCT" in the last 72 hours. Urinalysis    Component Value Date/Time   COLORURINE YELLOW 02/09/2022 2208   APPEARANCEUR Clear 04/27/2022 1357   LABSPEC 1.036 (H) 02/09/2022 2208   PHURINE 5.0 02/09/2022 2208   GLUCOSEU 3+ (A) 04/27/2022 1357   HGBUR SMALL (A) 02/09/2022 2208   BILIRUBINUR Negative 04/27/2022 1357   KETONESUR 20 (A) 02/09/2022 2208   PROTEINUR 1+ (A) 04/27/2022 1357   PROTEINUR 30 (A) 02/09/2022 2208   NITRITE Positive (A) 04/27/2022 1357   NITRITE POSITIVE (A) 02/09/2022 2208   LEUKOCYTESUR 1+ (A) 04/27/2022 1357   LEUKOCYTESUR NEGATIVE 02/09/2022 2208   Sepsis Labs Recent Labs  Lab 07/30/22 1117 07/31/22 0547 08/01/22 0437  WBC 12.5* 11.5* 12.3*   Microbiology Recent Results (from the past 240 hour(s))  SARS Coronavirus 2 by RT PCR (hospital order, performed in [facility] hospital lab) *cepheid single result test* Anterior Nasal Swab     Status: None   Collection Time: 07/30/22 11:28 AM   Specimen: Anterior Nasal Swab  Result  Value Ref Range Status   SARS Coronavirus 2 by RT PCR NEGATIVE NEGATIVE Final    Comment: (NOTE) SARS-CoV-2 target nucleic acids are NOT DETECTED.  The SARS-CoV-2 RNA is generally detectable in upper and lower respiratory specimens during the acute phase of infection. The lowest concentration of SARS-CoV-2 viral copies this assay can detect is 250 copies / mL. A negative result does not preclude SARS-CoV-2 infection and should not be used as the  sole basis for treatment or other patient management decisions.  A negative result may occur with improper specimen collection / handling, submission of specimen other than nasopharyngeal swab, presence of viral mutation(s) within the areas targeted by this assay, and inadequate number of viral copies (<250 copies / mL). A negative result must be combined with clinical observations, patient history, and epidemiological information.  Fact Sheet for Patients:   RoadLapTop.co.zahttps://www.fda.gov/media/158405/download  Fact Sheet for Healthcare Providers: http://kim-miller.com/https://www.fda.gov/media/158404/download  This test is not yet approved or  cleared by the [place] FDA and has been authorized for detection and/or diagnosis of SARS-CoV-2 by FDA under an Emergency Use Authorization (EUA).  This EUA will remain in effect (meaning this test can be used) for the duration of the COVID-19 declaration under Section 564(b)(1) of the Act, 21 U.S.C. section 360bbb-3(b)(1), unless the authorization is terminated or revoked sooner.  Performed at [facility], [address] [place], [place] [address]      Time coordinating discharge: 35 minutes  SIGNED:   Erick BlinksPratik D Lyndsay Talamante, DO [facility] 08/01/2022, 9:53 AM  If 7PM-7AM, please contact night-coverage [email or web address]

## 2022-08-03 ENCOUNTER — Other Ambulatory Visit: Payer: Self-pay | Admitting: Family Medicine

## 2022-08-03 ENCOUNTER — Ambulatory Visit: Payer: 59 | Admitting: Internal Medicine

## 2022-08-07 ENCOUNTER — Ambulatory Visit: Payer: 59 | Admitting: Orthopedic Surgery

## 2022-08-11 ENCOUNTER — Encounter: Payer: Self-pay | Admitting: Internal Medicine

## 2022-08-11 ENCOUNTER — Ambulatory Visit (INDEPENDENT_AMBULATORY_CARE_PROVIDER_SITE_OTHER): Payer: 59 | Admitting: Internal Medicine

## 2022-08-11 ENCOUNTER — Other Ambulatory Visit: Payer: Self-pay | Admitting: Internal Medicine

## 2022-08-11 DIAGNOSIS — M170 Bilateral primary osteoarthritis of knee: Secondary | ICD-10-CM

## 2022-08-11 DIAGNOSIS — I878 Other specified disorders of veins: Secondary | ICD-10-CM

## 2022-08-11 DIAGNOSIS — I152 Hypertension secondary to endocrine disorders: Secondary | ICD-10-CM

## 2022-08-11 DIAGNOSIS — I5032 Chronic diastolic (congestive) heart failure: Secondary | ICD-10-CM

## 2022-08-11 DIAGNOSIS — E1159 Type 2 diabetes mellitus with other circulatory complications: Secondary | ICD-10-CM | POA: Diagnosis not present

## 2022-08-11 DIAGNOSIS — E1169 Type 2 diabetes mellitus with other specified complication: Secondary | ICD-10-CM

## 2022-08-11 DIAGNOSIS — I872 Venous insufficiency (chronic) (peripheral): Secondary | ICD-10-CM

## 2022-08-11 DIAGNOSIS — E785 Hyperlipidemia, unspecified: Secondary | ICD-10-CM

## 2022-08-11 HISTORY — DX: Chronic diastolic (congestive) heart failure: I50.32

## 2022-08-11 MED ORDER — SEMAGLUTIDE-WEIGHT MANAGEMENT 1.7 MG/0.75ML ~~LOC~~ SOAJ: 1.7000 mg | SUBCUTANEOUS | 0 refills | Status: DC

## 2022-08-11 MED ORDER — SEMAGLUTIDE-WEIGHT MANAGEMENT 2.4 MG/0.75ML ~~LOC~~ SOAJ: 2.4000 mg | SUBCUTANEOUS | 0 refills | Status: DC

## 2022-08-11 MED ORDER — SEMAGLUTIDE-WEIGHT MANAGEMENT 0.5 MG/0.5ML ~~LOC~~ SOAJ: 0.5000 mg | SUBCUTANEOUS | 0 refills | Status: DC

## 2022-08-11 MED ORDER — SEMAGLUTIDE-WEIGHT MANAGEMENT 1 MG/0.5ML ~~LOC~~ SOAJ: 1.0000 mg | SUBCUTANEOUS | 0 refills | Status: DC

## 2022-08-11 MED ORDER — SEMAGLUTIDE-WEIGHT MANAGEMENT 0.25 MG/0.5ML ~~LOC~~ SOAJ: 0.2500 mg | SUBCUTANEOUS | 0 refills | Status: DC

## 2022-08-11 MED ORDER — CLOTRIMAZOLE-BETAMETHASONE 1-0.05 % EX CREA: 1.0000 | TOPICAL_CREAM | Freq: Two times a day (BID) | CUTANEOUS | 0 refills | Status: DC

## 2022-08-11 NOTE — Progress Notes (Signed)
Today's healthcare provider: [name], MD  Phone: [phone]  New patient visit  Visit Date: 08/11/2022 Patient: Jason Meyer   DOB: 08-01-1961   61 y.o. Male  MRN: [id number]  Assessment and Plan:    This is a new patient establish and hospital follow up office visit.  He recently was released from hospital after being treated there for acute diastolic congestive heart failure which has resulted in severe venous stasis dermatitis but since leaving the hospital is legs have slowly improved and his breathing is continue to improve as well and he is following the recommendations for managing his heart failure with fluid pills and limiting salt.  I explained in detail the mechanisms and pathophysiology of congestive heart failure and why I believe that his diastolic heart failure is primarily due to increased intra-abdominal pressure secondary to his weight and the fact that he puts most of his weight into his abdomen.  Therefore we are aggressively targeting weight loss and the best option I think is [facility] if we can get it with a coupon right now, even though he already is on trulicity So we will try to get that and have him come back in a month.  In terms of exercise because he was recently in heart failure I wanted him to get his exercise at the cardiac rehab clinic to help him to lose fat and muscle as he loses weight with MEQAST.  I think if he loses the weight he will see big improvements in his breathing.  I also advised him to limit carbohydrates in his diet because the specifically because the release of insulin and prevent the burning of fat.  His a1c is controlled on trulicity so he wont qualify for mounjaro or I'd go for that.  Will try for [facility] to start  Deroy was seen today for establish care.  Type 2 diabetes mellitus with other specified complication, without long-term current use of insulin (HCC)  Hypertension associated with diabetes (HCC) Overview: D/c acei and  lopressor 10/13/2021 due to concerns adverse drug effects   Morbid obesity (HCC) Overview: Body mass index is 53.89 kg/m.    Assessment & Plan: Jason Meyer  He will use coupon to get it covered. A/w diastolic heart failure Also on cpap Low calorie, low carbs   Hyperlipidemia associated with type 2 diabetes mellitus (HCC)  Venous stasis  Osteoarthritis of both knees, unspecified osteoarthritis type  Chronic diastolic heart failure (HCC) Overview: Admitted with acute hypoxemic respiratory failure in the setting of acute diastolic CHF exacerbation   Result Date: 07/31/2022    ECHOCARDIOGRAM REPORT   Patient Name:   Jason Meyer Date of Exam: 07/31/2022 Medical Rec #:  [id number]        Height:       71.0 in Accession #:    [id number]       Weight:       390.4 lb Date of Birth:  [birth year]       BSA:          2.802 m Patient Age:    60 years         BP:           144/81 mmHg Patient Gender: M                HR:           73 bpm. [facility] Procedure: 2D Echo, Cardiac Doppler and Color Doppler Indications:  Congestive Heart Failure I50.9  History:        Patient has prior history of Echocardiogram examinations, most                 recent 09/06/2021. Risk Factors:Hypertension, Diabetes and                 Dyslipidemia. Morbid Obesity.  Sonographer:    Celesta Gentile RCS Referring Phys: [phone] Heloise Beecham [facility]  Sonographer Comments: Image acquisition challenging due to patient body habitus. IMPRESSIONS  1. Left ventricular ejection fraction, by estimation, is 60 to 65%. The left ventricle has normal function. The left ventricle has no regional wall motion abnormalities. There is moderate left ventricular hypertrophy. Left ventricular diastolic parameters were normal.  2. Right ventricular systolic function was not well visualized. The right ventricular size is not well visualized. Tricuspid regurgitation signal is inadequate for assessing PA pressure.  3. The mitral valve  was not well visualized. No evidence of mitral valve regurgitation. No evidence of mitral stenosis.  4. The aortic valve was not well visualized. Aortic valve regurgitation is not visualized. No aortic stenosis is present.  5. The inferior vena cava is dilated in size with >50% respiratory variability, suggesting right atrial pressure of 8 mmHg. FINDINGS  Left Ventricle: Left ventricular ejection fraction, by estimation, is 60 to 65%. The left ventricle has normal function. The left ventricle has no regional wall motion abnormalities. Definity contrast agent was given IV to delineate the left ventricular  endocardial borders. The left ventricular internal cavity size was normal in size. There is moderate left ventricular hypertrophy. Left ventricular diastolic parameters were normal. Right Ventricle: The right ventricular size is not well visualized. Right vetricular wall thickness was not well visualized. Right ventricular systolic function was not well visualized. Tricuspid regurgitation signal is inadequate for assessing PA pressure. Left Atrium: Left atrial size was not well visualized. Right Atrium: Right atrial size was not well visualized. Pericardium: There is no evidence of pericardial effusion. Mitral Valve: The mitral valve was not well visualized. No evidence of mitral valve regurgitation. No evidence of mitral valve stenosis. Tricuspid Valve: The tricuspid valve is not well visualized. Tricuspid valve regurgitation is not demonstrated. No evidence of tricuspid stenosis. Aortic Valve: The aortic valve was not well visualized. Aortic valve regurgitation is not visualized. No aortic stenosis is present. Aortic valve mean gradient measures 9.0 mmHg. Aortic valve peak gradient measures 16.6 mmHg. Aortic valve area, by VTI measures 3.07 cm. Pulmonic Valve: The pulmonic valve was not well visualized. Pulmonic valve regurgitation is not visualized. No evidence of pulmonic stenosis. Aorta: The aortic root is  normal in size and structure. Venous: The inferior vena cava is dilated in size with greater than 50% respiratory variability, suggesting right atrial pressure of 8 mmHg. IAS/Shunts: No atrial level shunt detected by color flow Doppler.  LEFT VENTRICLE PLAX 2D LVIDd:         5.20 cm   Diastology LVIDs:         3.35 cm   LV e' medial:    10.20 cm/s LV PW:         1.35 cm   LV E/e' medial:  10.1 LV IVS:        1.40 cm   LV e' lateral:   11.90 cm/s LVOT diam:     2.30 cm   LV E/e' lateral: 8.7 LV SV:         132 LV SV Index:   47 LVOT Area:  4.15 cm  RIGHT VENTRICLE RV S prime:     11.60 cm/s TAPSE (M-mode): 3.0 cm LEFT ATRIUM             Index LA diam:        3.75 cm 1.34 cm/m LA Vol (A2C):   87.6 ml 31.26 ml/m LA Vol (A4C):   84.2 ml 30.05 ml/m LA Biplane Vol: 91.4 ml 32.62 ml/m  AORTIC VALVE AV Area (Vmax):    2.77 cm AV Area (Vmean):   2.94 cm AV Area (VTI):     3.07 cm AV Vmax:           204.00 cm/s AV Vmean:          132.000 cm/s AV VTI:            0.431 m AV Peak Grad:      16.6 mmHg AV Mean Grad:      9.0 mmHg LVOT Vmax:         136.00 cm/s LVOT Vmean:        93.400 cm/s LVOT VTI:          0.318 m LVOT/AV VTI ratio: 0.74  AORTA Ao Root diam: 3.30 cm MITRAL VALVE MV Area (PHT): 3.08 cm     SHUNTS MV Decel Time: 246 msec     Systemic VTI:  0.32 m MV E velocity: 103.00 cm/s  Systemic Diam: 2.30 cm MV A velocity: 93.80 cm/s MV E/A ratio:  1.10 Dina Rich MD Electronically signed by Dina Rich MD Signature Date/Time: 07/31/2022/1:39:37 PM    Final     Assessment & Plan: Interim history since recent hospitalization Filed Weights   08/11/22 1402  Weight: (!) 386 lb 6.4 oz (175.3 kg)   He is doing sliding scale; furosemide 20 mg based on weight encouraged continue Weight was about 387 at hospital Leg swelling feels down from hospital Breathing feels better   Overall his breathing and leg swelling have improved and so I believe the current flare of diastolic heart failure is resolving  but I did encourage him to continue watching his salt checking his weight and following the instructions very closely regarding adjusting his fluid pill according to his daily body weight.  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  Therefore the main treatment for his heart failure is to lose weight and we are going to aggressively do that.  I also explained all of this to him so that he would be less concerned as heart failure can be a scary diagnosis to proceed if.   Venous stasis dermatitis of both lower extremities       Health Maintenance  Topic Date Due   OPHTHALMOLOGY EXAM  09/24/2021   COLONOSCOPY (Pts 45-25yrs Insurance coverage will need to be confirmed)  09/16/2022 (Originally 11/24/2007)   TETANUS/TDAP  09/16/2022 (Originally 11/23/1981)   Hepatitis C Screening  09/16/2022 (Originally 11/23/1980)   FOOT EXAM  09/25/2022 (Originally 09/15/2021)   COVID-19 Vaccine (3 - Moderna series) 11/24/2022 (Originally 06/10/2020)   Zoster Vaccines- Shingrix (1 of 2) 12/22/2022 (Originally 11/23/2012)   HEMOGLOBIN A1C  01/14/2023   HPV VACCINES  Aged Out   INFLUENZA VACCINE  Discontinued   HIV Screening  Discontinued       Recommended follow up: Return  in about 1 month (around 09/10/2022) for review response to fluid pills, weight loss med, dermatitis meds.  go over any other problems..      Subjective:  Patient presents today to establish care.  Prior patient of Dr. Louanne Skye.  Chief Complaint  Patient presents with   Establish Care    For history taking, I took a per problem history from the patient and chart review as follows: Problem  Chronic Diastolic Heart Failure (Hcc)   Admitted with acute hypoxemic respiratory failure in the setting of acute diastolic CHF  exacerbation   Result Date: 07/31/2022    ECHOCARDIOGRAM REPORT   Patient Name:   Jason Meyer Date of Exam: 07/31/2022 Medical Rec #:  [id number]        Height:       71.0 in Accession #:    [id number]       Weight:       390.4 lb Date of Birth:  06/13/1961       BSA:          2.802 m Patient Age:    60 years         BP:           144/81 mmHg Patient Gender: M                HR:           73 bpm. [facility] Procedure: 2D Echo, Cardiac Doppler and Color Doppler Indications:    Congestive Heart Failure I50.9  History:        Patient has prior history of Echocardiogram examinations, most                 recent 09/06/2021. Risk Factors:Hypertension, Diabetes and                 Dyslipidemia. Morbid Obesity.  Sonographer:    Celesta Gentile RCS Referring Phys: [phone] Heloise Beecham [facility]  Sonographer Comments: Image acquisition challenging due to patient body habitus. IMPRESSIONS  1. Left ventricular ejection fraction, by estimation, is 60 to 65%. The left ventricle has normal function. The left ventricle has no regional wall motion abnormalities. There is moderate left ventricular hypertrophy. Left ventricular diastolic parameters were normal.  2. Right ventricular systolic function was not well visualized. The right ventricular size is not well visualized. Tricuspid regurgitation signal is inadequate for assessing PA pressure.  3. The mitral valve was not well visualized. No evidence of mitral valve regurgitation. No evidence of mitral stenosis.  4. The aortic valve was not well visualized. Aortic valve regurgitation is not visualized. No aortic stenosis is present.  5. The inferior vena cava is dilated in size with >50% respiratory variability, suggesting right atrial pressure of 8 mmHg. FINDINGS  Left Ventricle: Left ventricular ejection fraction, by estimation, is 60 to 65%. The left ventricle has normal function. The left ventricle has no regional wall motion abnormalities. Definity contrast agent  was given IV to delineate the left ventricular  endocardial borders. The left ventricular internal cavity size was normal in size. There is moderate left ventricular hypertrophy. Left ventricular diastolic parameters were normal. Right Ventricle: The right ventricular size is not well visualized. Right vetricular wall thickness was not well visualized. Right ventricular systolic function was not well visualized. Tricuspid regurgitation signal is inadequate for assessing PA pressure. Left Atrium: Left atrial size was not well visualized. Right Atrium: Right atrial size was not well visualized. Pericardium: There is no evidence  of pericardial effusion. Mitral Valve: The mitral valve was not well visualized. No evidence of mitral valve regurgitation. No evidence of mitral valve stenosis. Tricuspid Valve: The tricuspid valve is not well visualized. Tricuspid valve regurgitation is not demonstrated. No evidence of tricuspid stenosis. Aortic Valve: The aortic valve was not well visualized. Aortic valve regurgitation is not visualized. No aortic stenosis is present. Aortic valve mean gradient measures 9.0 mmHg. Aortic valve peak gradient measures 16.6 mmHg. Aortic valve area, by VTI measures 3.07 cm. Pulmonic Valve: The pulmonic valve was not well visualized. Pulmonic valve regurgitation is not visualized. No evidence of pulmonic stenosis. Aorta: The aortic root is normal in size and structure. Venous: The inferior vena cava is dilated in size with greater than 50% respiratory variability, suggesting right atrial pressure of 8 mmHg. IAS/Shunts: No atrial level shunt detected by color flow Doppler.  LEFT VENTRICLE PLAX 2D LVIDd:         5.20 cm   Diastology LVIDs:         3.35 cm   LV e' medial:    10.20 cm/s LV PW:         1.35 cm   LV E/e' medial:  10.1 LV IVS:        1.40 cm   LV e' lateral:   11.90 cm/s LVOT diam:     2.30 cm   LV E/e' lateral: 8.7 LV SV:         132 LV SV Index:   47 LVOT Area:     4.15 cm  RIGHT  VENTRICLE RV S prime:     11.60 cm/s TAPSE (M-mode): 3.0 cm LEFT ATRIUM             Index LA diam:        3.75 cm 1.34 cm/m LA Vol (A2C):   87.6 ml 31.26 ml/m LA Vol (A4C):   84.2 ml 30.05 ml/m LA Biplane Vol: 91.4 ml 32.62 ml/m  AORTIC VALVE AV Area (Vmax):    2.77 cm AV Area (Vmean):   2.94 cm AV Area (VTI):     3.07 cm AV Vmax:           204.00 cm/s AV Vmean:          132.000 cm/s AV VTI:            0.431 m AV Peak Grad:      16.6 mmHg AV Mean Grad:      9.0 mmHg LVOT Vmax:         136.00 cm/s LVOT Vmean:        93.400 cm/s LVOT VTI:          0.318 m LVOT/AV VTI ratio: 0.74  AORTA Ao Root diam: 3.30 cm MITRAL VALVE MV Area (PHT): 3.08 cm     SHUNTS MV Decel Time: 246 msec     Systemic VTI:  0.32 m MV E velocity: 103.00 cm/s  Systemic Diam: 2.30 cm MV A velocity: 93.80 cm/s MV E/A ratio:  1.10 Dina Rich MD Electronically signed by Dina Rich MD Signature Date/Time: 07/31/2022/1:39:37 PM    Final      Morbid obesity (HCC)   Body mass index is 53.89 kg/m.        Depression Screen    08/11/2022    2:17 PM 07/14/2022    2:36 PM 04/27/2022    2:04 PM 03/29/2022   11:01 AM  PHQ 2/9 Scores  PHQ - 2 Score 0  Exception Documentation  Patient refusal Patient refusal Patient refusal   No results found for any visits on 08/11/22.   The following were reviewed and entered/updated in epic: Past Medical History:  Diagnosis Date   Allergy    Arthritis    Chronic diastolic heart failure (HCC) 08/11/2022   Admitted with acute hypoxemic respiratory failure in the setting of acute diastolic CHF exacerbation      Hyperlipidemia    Hypertension    Sleep apnea    Type 2 diabetes mellitus (HCC)    Past Surgical History:  Procedure Laterality Date   NO PAST SURGERIES     Past Surgical History:  Procedure Laterality Date   NO PAST SURGERIES     Family Status  Relation Name Status   Mother  Deceased   Father  Deceased   Brother  Deceased   Family History  Problem Relation Age  of Onset   Alzheimer's disease Mother    Hypertension Father    Heart disease Father    Heart attack Father    Stroke Father    Heart attack Brother    Outpatient Medications Prior to Visit  Medication Sig Dispense Refill   albuterol (PROVENTIL) (2.5 MG/3ML) 0.083% nebulizer solution INHALE CONTENTS OF 1 VIAL VIA NEBULIZER EVERY 6 HOURS AS NEEDED (Patient taking differently: Take 2.5 mg by nebulization every 6 (six) hours as needed for wheezing or shortness of breath.) 375 mL 0   albuterol (VENTOLIN HFA) 108 (90 Base) MCG/ACT inhaler TAKE 2 PUFFS BY MOUTH EVERY 6 HOURS AS NEEDED FOR WHEEZE OR SHORTNESS OF BREATH (Patient taking differently: Inhale 2 puffs into the lungs every 6 (six) hours as needed for wheezing or shortness of breath.) 8.5 each 2   amLODipine (NORVASC) 10 MG tablet Take 1 tablet (10 mg total) by mouth daily. 90 tablet 3   atorvastatin (LIPITOR) 80 MG tablet Take 1 tablet (80 mg total) by mouth daily. 90 tablet 3   bisoprolol (ZEBETA) 10 MG tablet Take 2 tablets (20 mg total) by mouth daily. 180 tablet 3   diclofenac (VOLTAREN) 50 MG EC tablet TAKE 1 TABLET BY MOUTH TWICE A DAY 60 tablet 1   Dulaglutide (TRULICITY) 1.5 MG/0.5ML SOPN Inject 1.5 mg into the skin once a week. 6 mL 3   fluticasone furoate-vilanterol (BREO ELLIPTA) 100-25 MCG/ACT AEPB Inhale 1 puff into the lungs daily. 1 each 11   furosemide (LASIX) 20 MG tablet Take 1 tablet (20 mg total) by mouth daily. Take extra 20mg  for 3 pound weight gain in 24 hours, or swelling or shortness of breath 30 tablet 3   hydrALAZINE (APRESOLINE) 50 MG tablet Take 1 tablet (50 mg total) by mouth 2 (two) times daily. 180 tablet 1   metFORMIN (GLUCOPHAGE) 1000 MG tablet Take 1 tablet (1,000 mg total) by mouth 2 (two) times daily with a meal. 180 tablet 3   olmesartan (BENICAR) 40 MG tablet Take 1 tablet (40 mg total) by mouth daily. 30 tablet 11   potassium chloride (MICRO-K) 10 MEQ CR capsule Take 1 capsule (10 mEq total) by mouth  daily. 30 capsule 3   sildenafil (REVATIO) 20 MG tablet 2-5 pills once daily as needed for erectile dysfunction 50 tablet 3   azelastine (ASTELIN) 0.1 % nasal spray Place 1 spray into both nostrils 2 (two) times daily. Use in each nostril as directed (Patient not taking: Reported on 08/11/2022) 30 mL 12   fluticasone (FLONASE) 50 MCG/ACT nasal spray Place 2 sprays into both  nostrils daily. (Patient not taking: Reported on 07/30/2022) 16 g 6   guaiFENesin (MUCINEX) 600 MG 12 hr tablet Take 1 tablet (600 mg total) by mouth 2 (two) times daily. (Patient not taking: Reported on 08/11/2022) 60 tablet 2   tamsulosin (FLOMAX) 0.4 MG CAPS capsule Take 0.4 mg by mouth daily. (Patient not taking: Reported on 08/11/2022)     No facility-administered medications prior to visit.    Allergies  Allergen Reactions   Penicillins Swelling   Social History   Tobacco Use   Smoking status: Former    Packs/day: 1.00    Years: 33.00    Total pack years: 33.00    Types: Cigarettes    Quit date: 12/12/2007    Years since quitting: 14.6   Smokeless tobacco: Never  Vaping Use   Vaping Use: Never used  Substance Use Topics   Alcohol use: Not Currently   Drug use: No    Immunization History  Administered Date(s) Administered   Moderna Sars-Covid-2 Vaccination 03/11/2020, 04/15/2020      Objective:  BP 134/70 (BP Location: Left Arm, Patient Position: Sitting)   Pulse 76   Temp 97.9 F (36.6 C) (Temporal)   Resp 16   Ht 5\' 11"  (1.803 m)   Wt (!) 386 lb 6.4 oz (175.3 kg)   SpO2 95%   BMI 53.89 kg/m  Body mass index is 53.89 kg/m.  He  is a very cordial and polite person who was a pleasure to meet.  Gen: NAD, resting comfortably, marked truncal obesity/abdominal adiposity yet though not wanting to be a basically HEENT: Mucous membranes are moist. Sclera conjunctiva and lids grossly normal Neck: no thyromegaly, no cervical lymphadenopathy CV: RRR no murmurs rubs or gallops Lungs: CTAB no  crackles, wheeze, rhonchi Abdomen: soft/nontender/nondistended. No rebound or guarding.  Ext: no edema Skin: warm, dry Neuro: grossly intact  He has whitish plaquing over top of purple indurated cracking skin over his entire legs which is still somewhat swollen.  I am concerned that this is a fungal infection intertwined with venous stasis dermatitis so I will treat it with a combination steroid and antifungal and follow-up on it in a month  No images are attached to the encounter or orders placed in the encounter.  Results for orders placed or performed during the hospital encounter of 07/30/22  SARS Coronavirus 2 by RT PCR (hospital order, performed in [facility] hospital lab) *cepheid single result test* Anterior Nasal Swab   Specimen: Anterior Nasal Swab  Result Value Ref Range   SARS Coronavirus 2 by RT PCR NEGATIVE NEGATIVE  Basic metabolic panel  Result Value Ref Range   Sodium 140 135 - 145 mmol/L   Potassium 4.0 3.5 - 5.1 mmol/L   Chloride 108 98 - 111 mmol/L   CO2 25 22 - 32 mmol/L   Glucose, Bld 117 (H) 70 - 99 mg/dL   BUN 12 6 - 20 mg/dL   Creatinine, Ser [facility] 0.61 - 1.24 mg/dL   Calcium 8.9 8.9 - 3.82 mg/dL   GFR, Estimated 50.5 >39 mL/min   Anion gap 7 5 - 15  CBC with Differential  Result Value Ref Range   WBC 12.5 (H) 4.0 - 10.5 K/uL   RBC 5.62 4.22 - 5.81 MIL/uL   Hemoglobin 14.4 13.0 - 17.0 g/dL   HCT >76 73.4 - 19.3 %   MCV 80.6 80.0 - 100.0 fL   MCH 25.6 (L) 26.0 - 34.0 pg   MCHC 31.8 30.0 -  36.0 g/dL   RDW 19.1 (H) 47.8 - 29.5 %   Platelets 269 150 - 400 K/uL   nRBC 0.0 0.0 - 0.2 %   Neutrophils Relative % 85 %   Neutro Abs 10.5 (H) 1.7 - 7.7 K/uL   Lymphocytes Relative 8 %   Lymphs Abs 1.1 0.7 - 4.0 K/uL   Monocytes Relative 5 %   Monocytes Absolute 0.6 0.1 - 1.0 K/uL   Eosinophils Relative 2 %   Eosinophils Absolute 0.3 0.0 - 0.5 K/uL   Basophils Relative 0 %   Basophils Absolute 0.1 0.0 - 0.1 K/uL   Immature Granulocytes 0 %   Abs Immature  Granulocytes 0.05 0.00 - 0.07 K/uL  Brain natriuretic peptide  Result Value Ref Range   B Natriuretic Peptide 61.0 0.0 - 100.0 pg/mL  D-dimer, quantitative  Result Value Ref Range   D-Dimer, Quant 0.66 (H) 0.00 - 0.50 ug/mL-FEU  CBC  Result Value Ref Range   WBC 11.5 (H) 4.0 - 10.5 K/uL   RBC 5.47 4.22 - 5.81 MIL/uL   Hemoglobin 14.0 13.0 - 17.0 g/dL   HCT 62.1 30.8 - 65.7 %   MCV 81.0 80.0 - 100.0 fL   MCH 25.6 (L) 26.0 - 34.0 pg   MCHC 31.6 30.0 - 36.0 g/dL   RDW 84.6 (H) 96.2 - 95.2 %   Platelets 303 150 - 400 K/uL   nRBC 0.0 0.0 - 0.2 %  Basic metabolic panel  Result Value Ref Range   Sodium 139 135 - 145 mmol/L   Potassium 4.0 3.5 - 5.1 mmol/L   Chloride 106 98 - 111 mmol/L   CO2 26 22 - 32 mmol/L   Glucose, Bld 166 (H) 70 - 99 mg/dL   BUN 15 6 - 20 mg/dL   Creatinine, Ser 8.41 0.61 - 1.24 mg/dL   Calcium 9.1 8.9 - 32.4 mg/dL   GFR, Estimated >40 >10 mL/min   Anion gap 7 5 - 15  Glucose, capillary  Result Value Ref Range   Glucose-Capillary 209 (H) 70 - 99 mg/dL  Glucose, capillary  Result Value Ref Range   Glucose-Capillary 205 (H) 70 - 99 mg/dL  Glucose, capillary  Result Value Ref Range   Glucose-Capillary 159 (H) 70 - 99 mg/dL  Glucose, capillary  Result Value Ref Range   Glucose-Capillary 175 (H) 70 - 99 mg/dL  Basic metabolic panel  Result Value Ref Range   Sodium 140 135 - 145 mmol/L   Potassium 3.4 (L) 3.5 - 5.1 mmol/L   Chloride 108 98 - 111 mmol/L   CO2 25 22 - 32 mmol/L   Glucose, Bld 129 (H) 70 - 99 mg/dL   BUN 17 6 - 20 mg/dL   Creatinine, Ser 2.72 0.61 - 1.24 mg/dL   Calcium 8.8 (L) 8.9 - 10.3 mg/dL   GFR, Estimated >53 >66 mL/min   Anion gap 7 5 - 15  Magnesium  Result Value Ref Range   Magnesium 2.2 1.7 - 2.4 mg/dL  CBC  Result Value Ref Range   WBC 12.3 (H) 4.0 - 10.5 K/uL   RBC 5.36 4.22 - 5.81 MIL/uL   Hemoglobin 13.7 13.0 - 17.0 g/dL   HCT 44.0 34.7 - 42.5 %   MCV 81.3 80.0 - 100.0 fL   MCH 25.6 (L) 26.0 - 34.0 pg   MCHC 31.4  30.0 - 36.0 g/dL   RDW 95.6 (H) 38.7 - 56.4 %   Platelets 298 150 - 400 K/uL  nRBC 0.0 0.0 - 0.2 %  Glucose, capillary  Result Value Ref Range   Glucose-Capillary 123 (H) 70 - 99 mg/dL  Glucose, capillary  Result Value Ref Range   Glucose-Capillary 131 (H) 70 - 99 mg/dL  Glucose, capillary  Result Value Ref Range   Glucose-Capillary 137 (H) 70 - 99 mg/dL  Glucose, capillary  Result Value Ref Range   Glucose-Capillary 121 (H) 70 - 99 mg/dL   Comment 1 Notify RN    Comment 2 Document in Chart   ECHOCARDIOGRAM COMPLETE  Result Value Ref Range   Weight 6,246.95 oz   Height 71 in   BP 144/81 mmHg   AR max vel 2.77 cm2   AV Area VTI 3.07 cm2   AV Mean grad 9.0 mmHg   AV Peak grad 16.6 mmHg   Ao pk vel 2.04 m/s   AV Area mean vel 2.94 cm2   Area-P 1/2 3.08 cm2   S' Lateral 3.35 cm  Troponin I (High Sensitivity)  Result Value Ref Range   Troponin I (High Sensitivity) 7 <18 ng/L

## 2022-08-11 NOTE — Patient Instructions (Addendum)
It was a pleasure seeing you today!  Today the plan is...  Try to get wegovy with coupon.  Avoid carbs which prevent fat burn, and go to cardiac rehab to get safe exercise.  Continue close monitoring of your fluid status (limit salts check weights, change fluid pills as needed)    Loralee Pacas, MD   Return in about 1 month (around 09/10/2022) for review response to fluid pills, weight loss med, dermatitis meds.  go over any other problems..    - Please bring all your medicines to your next appointment. This is the best way for me to know exactly what you're taking.  - If your condition begins to worsen or become severe:  go to the ER. - If your condition fails to resolve or you have other questions / concerns: please contact me via phone [phone] or MyChart messaging.     Heart Failure, Diagnosis  Heart failure is a condition in which the heart has trouble pumping blood. This may mean that the heart cannot pump enough blood out to the body or that the heart does not fill up with enough blood. For some people with heart failure, fluid may back up into the lungs. There may also be swelling (edema) in the lower legs. Heart failure is usually a long-term (chronic) condition. It is important for you to take good care of yourself and follow the treatment plan from your health care provider. Different stages of heart failure have different treatment plans. The stages are: Stage A: At risk for heart failure. Having no symptoms of heart failure, but being at risk for developing heart failure. Stage B: Pre-heart failure. Having no symptoms of heart failure, but having structural changes to the heart that indicate heart failure. Stage C: Symptomatic heart failure. Having symptoms of heart failure in addition to structural changes to the heart that indicate heart failure. Stage D: Advanced heart failure. Having symptoms that interfere with daily life and frequent hospitalizations related  to heart failure. What are the causes? This condition may be caused by: High blood pressure (hypertension). Hypertension causes the heart muscle to work harder than normal. Coronary artery disease, or CAD. CAD is the buildup of cholesterol and fat (plaque) in the arteries of the heart. Heart attack, also called myocardial infarction. This injures the heart muscle, making it hard for the heart to pump blood. Abnormal heart valves. The valves do not open and close properly, forcing the heart to pump harder to keep the blood flowing. Heart muscle disease, inflammation, or infection (cardiomyopathy or myocarditis). This is damage to the heart muscle. It can increase the risk of heart failure. Lung disease. The heart works harder when the lungs are not healthy. What increases the risk? The risk of heart failure increases as a person ages. This condition is also more likely to develop in people who: Are obese. Use tobacco or nicotine products. Abuse alcohol or drugs. Have taken medicines that can damage the heart, such as chemotherapy drugs. Have any of these conditions: Diabetes. Abnormal heart rhythms. Thyroid problems. Low blood counts (anemia). Chronic kidney disease. Have a family history of heart failure. What are the signs or symptoms? Symptoms of this condition include: Shortness of breath with activity, such as when climbing stairs. A cough that does not go away. Swelling of the feet, ankles, legs, or abdomen. Losing or gaining weight for no reason. Trouble breathing when lying flat. Waking from sleep because of the need to sit up and get  more air. Rapid heartbeat. Other symptoms may include: Tiredness (fatigue) and loss of energy. Feeling light-headed, dizzy, or close to fainting. Nausea or loss of appetite. Waking up more often during the night to urinate (nocturia). Confusion. How is this diagnosed? This condition is diagnosed based on: Your medical history, symptoms, and  a physical exam. Blood tests. Diagnostic tests, which may include: Echocardiogram. Electrocardiogram (ECG). Chest X-ray. Exercise stress test. Cardiac MRI. Cardiac catheterization and angiogram. Radionuclide scans. How is this treated? Treatment for this condition is aimed at managing the symptoms of heart failure. Medicines Treatment may include medicines that: Help lower blood pressure by relaxing (dilating) the blood vessels. These medicines are called ACE inhibitors (angiotensin-converting enzyme), ARBs (angiotensin receptor blockers), or vasodilators. Cause the kidneys to remove salt and water from the blood through urination (diuretics). Improve heart muscle strength and prevent the heart from beating too fast (beta blockers). Increase the force of the heartbeat (digoxin). Lower heart rates. Certain diabetes medicines (SGLT-2 inhibitors) may also be used in treatment. Healthy behavior changes Treatment may also include making healthy lifestyle changes, such as: Reaching and staying at a healthy weight. Not using tobacco or nicotine products. Eating heart-healthy foods. Limiting or avoiding alcohol. Stopping the use of illegal drugs. Being physically active. Participating in a cardiac rehabilitation program, which is a treatment program to improve your health and well-being through exercise training, education, and counseling. Other treatments Other treatments may include: Procedures to open blocked arteries or repair damaged valves. Placing a pacemaker to improve heart function (cardiac resynchronization therapy). Placing a device to treat serious abnormal heart rhythms (implantable cardioverter defibrillator, or ICD). Placing a device to improve the pumping ability of the heart (left ventricular assist device, or LVAD). Receiving a healthy heart from a donor (heart transplant). This is done when other treatments have not helped. Follow these instructions at home: Manage  other health conditions as told by your health care provider. These may include hypertension, diabetes, thyroid disease, or abnormal heart rhythms. Get ongoing education and support as needed. Learn as much as you can about heart failure. Keep all follow-up visits. This is important. Where to find more information American Heart Association: www.heart.org Centers for Disease Control and Prevention: http://www.wolf.info/ NIH National Institute on Aging: http://kim-miller.com/ Summary Heart failure is a condition in which the heart has trouble pumping blood. This condition is commonly caused by high blood pressure and other diseases of the heart and lungs. Symptoms of this condition include shortness of breath, tiredness (fatigue), nausea, and swelling of the feet, ankles, legs, or abdomen. Treatments for this condition may include medicines, lifestyle changes, and surgery. Manage other health conditions as told by your health care provider. This information is not intended to replace advice given to you by your health care provider. Make sure you discuss any questions you have with your health care provider. Document Revised: 03/07/2022 Document Reviewed: 06/19/2020 Elsevier Patient Education  [place].  Heart Failure Eating Plan Heart failure, also called congestive heart failure, occurs when your heart does not pump blood well enough to meet your body's needs for oxygen-rich blood. Heart failure is a long-term (chronic) condition. Living with heart failure can be challenging. Following your health care provider's instructions about a healthy lifestyle and working with a dietitian to choose the right foods may help to improve your symptoms. An eating plan for someone with heart failure will include changes that limit the intake of salt (sodium) and unhealthy fat. What are tips for following this plan?  Reading food labels Check food labels for the amount of sodium per serving. Choose foods that have  less than 140 mg (milligrams) of sodium in each serving. Check food labels for the number of calories per serving. This is important if you need to limit your daily calorie intake to lose weight. Check food labels for the serving size. If you eat more than one serving, you will be eating more sodium and calories than what is listed on the label. Look for foods that are labeled as "sodium-free," "very low sodium," or "low sodium." Foods labeled as "reduced sodium" or "lightly salted" may still have more sodium than what is recommended for you. Cooking Avoid adding salt when cooking. Ask your health care provider or dietitian before using salt substitutes. Season food with salt-free seasonings, spices, or herbs. Check the label of seasoning mixes to make sure they do not contain salt. Cook with heart-healthy oils, such as olive, canola, soybean, or sunflower oil. Do not fry foods. Cook foods using low-fat methods, such as baking, boiling, grilling, and broiling. Limit unhealthy fats when cooking by: Removing the skin from poultry, such as chicken. Removing all visible fats from meats. Skimming the fat off from stews, soups, and gravies before serving them. Meal planning  Limit your intake of: Processed, canned, or prepackaged foods. Foods that are high in trans fat, such as fried foods. Sweets, desserts, sugary drinks, and other foods with added sugar. Full-fat dairy products, such as whole milk. Eat a balanced diet. This may include: 4-5 servings of fruit each day and 4-5 servings of vegetables each day. At each meal, try to fill one-half of your plate with fruits and vegetables. Up to 6-8 servings of whole grains each day. Up to 2 servings of lean meat, poultry, or fish each day. One serving of meat is equal to 3 oz (85 g). This is about the same size as a deck of cards. 2 servings of low-fat dairy each day. Heart-healthy fats. Healthy fats called omega-3 fatty acids are found in foods such  as flaxseed and cold-water fish like sardines, salmon, and mackerel. Aim to eat 25-35 g (grams) of fiber a day. Foods that are high in fiber include apples, broccoli, carrots, beans, peas, and whole grains. Do not add salt or condiments that contain salt (such as soy sauce) to foods before eating. When eating at a restaurant, ask that your food be prepared with less salt or no salt, if possible. Try to eat 2 or more vegetarian meals each week. Eat more home-cooked food and eat less restaurant, buffet, and fast food. General information Do not eat more than 2,300 mg of sodium a day. The amount of sodium that is recommended for you may be lower, depending on your condition. Maintain a healthy body weight as directed. Ask your health care provider what a healthy weight is for you. Check your weight every day. Work with your health care provider and dietitian to make a plan that is right for you to lose weight or maintain your current weight. Limit how much fluid you drink. Ask your health care provider or dietitian how much fluid you can have each day. Limit or avoid alcohol as told by your health care provider or dietitian. Recommended foods Fruits All fresh, frozen, and canned fruits. Dried fruits, such as raisins, prunes, and cranberries. Vegetables All fresh vegetables. Vegetables that are frozen without sauce or added salt. Low-sodium or sodium-free canned vegetables. Grains Bread with less than 80 mg of  sodium per slice. Whole-wheat pasta, quinoa, and brown rice. Oats and oatmeal. Barley. [place]. Grits and cream of wheat. Whole-grain and whole-wheat cold cereal. Meats and other protein foods Lean cuts of meat. Skinless chicken and [place]. Fish with high omega-3 fatty acids, such as salmon, sardines, and other cold-water fishes. Eggs. Dried beans, peas, and edamame. Unsalted nuts and nut butters. Dairy Low-fat or nonfat (skim) milk and dried milk. Rice milk, soy milk, and almond milk.  Low-fat or nonfat yogurt. Small amounts of reduced-sodium block cheese. Low-sodium cottage cheese. Fats and oils Olive, canola, soybean, flaxseed, avocado, or sunflower oil. Sweets and desserts Applesauce. Granola bars. Sugar-free pudding and gelatin. Frozen fruit bars. Seasoning and other foods Fresh and dried herbs. Lemon or lime juice. Vinegar. Low-sodium ketchup. Salt-free marinades, salad dressings, sauces, and seasonings. The items listed above may not be a complete list of foods and beverages you can eat. Contact a dietitian for more information. Foods to avoid Fruits Fruits that are dried with sodium-containing preservatives. Vegetables Canned vegetables. Frozen vegetables with sauce or seasonings. Creamed vegetables. [place] fries. Onion rings. Pickled vegetables and sauerkraut. Grains Bread with more than 80 mg of sodium per slice. Hot or cold cereal with more than 140 mg sodium per serving. Salted pretzels and crackers. Prepackaged breadcrumbs. Bagels, croissants, and biscuits. Meats and other protein foods Ribs and chicken wings. Bacon, ham, pepperoni, bologna, salami, and packaged luncheon meats. Hot dogs, bratwurst, and sausage. Canned meat. Smoked meat and fish. Salted nuts and seeds. Dairy Whole milk, half-and-half, and cream. Buttermilk. Processed cheese, cheese spreads, and cheese curds. Regular cottage cheese. Feta cheese. Shredded cheese. String cheese. Fats and oils Butter, lard, shortening, ghee, and bacon fat. Canned and packaged gravies. Seasoning and other foods Onion salt, garlic salt, table salt, and sea salt. Marinades. Regular salad dressings. Relishes, pickles, and olives. Meat flavorings and tenderizers, and bouillon cubes. Horseradish, ketchup, and mustard. Worcestershire sauce. Teriyaki sauce, soy sauce (including reduced sodium). Hot sauce and Tabasco sauce. Steak sauce, fish sauce, oyster sauce, and cocktail sauce. Taco seasonings. Barbecue sauce. [place]  sauce. The items listed above may not be a complete list of foods and beverages you should avoid. Contact a dietitian for more information. Summary A heart failure eating plan includes changes that limit your intake of sodium and unhealthy fat, and it may help you lose weight or maintain a healthy weight. Your health care provider may also recommend limiting how much fluid you drink. Most people with heart failure should eat no more than 2,300 mg of salt (sodium) a day. The amount of sodium that is recommended for you may be lower, depending on your condition. Contact your health care provider or dietitian before making any major changes to your diet. This information is not intended to replace advice given to you by your health care provider. Make sure you discuss any questions you have with your health care provider. Document Revised: 03/07/2022 Document Reviewed: 07/12/2020 Elsevier Patient Education  [place].    Heart Failure and Exercise Heart failure is a condition in which the heart does not fill or pump enough blood and oxygen to support your body and its functions. Heart failure is a long-term (chronic) condition. Living with heart failure can be challenging. However, following your health care provider's instructions about a healthy lifestyle may help improve your symptoms. This includes choosing the right exercise plan. Doing daily physical activity is important after a diagnosis of heart failure. You may have some activity restrictions, so talk  to your health care provider before doing any exercises. What are the benefits of exercise? Exercise may: Make your heart muscles stronger. Lower your blood pressure and cholesterol. Help you lose weight. Help your bones stay strong. Improve your blood circulation. Help your body use oxygen better. This relieves symptoms such as fatigue and shortness of breath. Help your mental health by lowering the risk of depression and other  problems. Improve your quality of life. Decrease your chance of hospital admission for heart failure. What is an exercise plan? An exercise plan is a set of specific exercises and training activities. You will work with your health care provider to create the exercise plan that works for you. The plan may include: Different types of exercises and how to do them. Cardiac rehabilitation exercises. These are supervised programs that are designed to strengthen your heart. What are strengthening exercises? Strengthening exercises are a type of physical activity that involves using resistance to improve your muscle strength. Strengthening exercises usually have repetitive motions. These types of exercises can include: Lifting weights. Using weight machines. Using resistance tubes and bands. Using kettlebells. Using your body weight, such as doing push-ups or squats. What are balance exercises? Balance exercises are another type of physical activity. They strengthen the muscles of the back, abdomen, and pelvis (core muscles) and improve your balance. They can also lower your risk of falling. These types of exercises can include: Standing on one leg. Walking backward, sideways, and in a straight line. Standing up after sitting, without using your hands. Shifting your weight from one leg to the other. Lifting one leg in front of you. Doing tai chi. This is a type of exercise that uses slow movements and deep breathing. How can I increase my flexibility? Having better flexibility can keep you from falling. It can also lengthen your muscles, improve your range of motion, and help your joints. You can increase your flexibility by: Doing tai chi. Doing yoga. Stretching. How much aerobic exercise should I get?  Aerobic exercise strengthens your breathing and circulation system and increases your body's use of oxygen. This type of exercise causes your heart to beat faster while you are doing it.  Examples of aerobic exercise include biking, walking, running, and swimming. Talk to your health care provider to find out how much aerobic exercise is safe for you. To do this type of exercise: Start exercising slowly, limiting the amount of time at first. You may need to start with 5 minutes of aerobic exercise every day. Slowly add more minutes until you can safely do at least 30 minutes of exercise at least 5 days a week. Summary Daily physical activity is important after a diagnosis of heart failure. Exercise can make your heart muscles stronger. It also offers other benefits that will improve your health. Exercise can decrease your chance of hospital admission for heart failure. Talk to your health care provider before doing any exercises. This information is not intended to replace advice given to you by your health care provider. Make sure you discuss any questions you have with your health care provider. Document Revised: 03/07/2022 Document Reviewed: 07/12/2020 Elsevier Patient Education  [place].

## 2022-08-11 NOTE — Assessment & Plan Note (Signed)
Interim history since recent hospitalization Filed Weights   08/11/22 1402  Weight: (!) 386 lb 6.4 oz (175.3 kg)   He is doing sliding scale; furosemide 20 mg based on weight encouraged continue Weight was about 387 at hospital Leg swelling feels down from hospital Breathing feels better   Overall his breathing and leg swelling have improved and so I believe the current flare of diastolic heart failure is resolving but I did encourage him to continue watching his salt checking his weight and following the instructions very closely regarding adjusting his fluid pill according to his daily body weight.  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  Therefore the main treatment for his heart failure is to lose weight and we are going to aggressively do that.  I also explained all of this to him so that he would be less concerned as heart failure can be a scary diagnosis to proceed if.

## 2022-08-11 NOTE — Assessment & Plan Note (Signed)
Started [facility]  He will use coupon to get it covered. A/w diastolic heart failure Also on cpap Low calorie, low carbs

## 2022-08-14 NOTE — Telephone Encounter (Signed)
Call patient.  Insurance wouldn't cover wegovy - but was he able to afford it with a coupon.  If not, I can try to get a different med for him.    Also I suggest we refer to weight loss clinic (they can often get more stuff approved) and maybe even bariatrics clinic (consider surgical weight loss)

## 2022-08-15 ENCOUNTER — Other Ambulatory Visit: Payer: Self-pay | Admitting: Internal Medicine

## 2022-08-15 ENCOUNTER — Other Ambulatory Visit: Payer: Self-pay

## 2022-08-15 DIAGNOSIS — Z794 Long term (current) use of insulin: Secondary | ICD-10-CM

## 2022-08-15 MED ORDER — TIRZEPATIDE 2.5 MG/0.5ML ~~LOC~~ SOAJ: 2.5000 mg | SUBCUTANEOUS | 5 refills | Status: DC

## 2022-08-18 ENCOUNTER — Telehealth (INDEPENDENT_AMBULATORY_CARE_PROVIDER_SITE_OTHER): Payer: 59 | Admitting: Family Medicine

## 2022-08-18 ENCOUNTER — Encounter: Payer: Self-pay | Admitting: Family Medicine

## 2022-08-18 DIAGNOSIS — R051 Acute cough: Secondary | ICD-10-CM

## 2022-08-18 DIAGNOSIS — U071 COVID-19: Secondary | ICD-10-CM

## 2022-08-18 MED ORDER — NIRMATRELVIR/RITONAVIR (PAXLOVID)TABLET: 3.0000 | ORAL_TABLET | Freq: Two times a day (BID) | ORAL | 0 refills | Status: DC

## 2022-08-18 MED ORDER — MOLNUPIRAVIR 200 MG PO CAPS: 4.0000 | ORAL_CAPSULE | Freq: Two times a day (BID) | ORAL | 0 refills | Status: AC

## 2022-08-18 NOTE — Progress Notes (Unsigned)
MyChart Video Visit    Virtual Visit via Video Note   This visit type was conducted due to national recommendations for restrictions regarding the COVID-19 Pandemic (e.g. social distancing) in an effort to limit this patient's exposure and mitigate transmission in our community. This patient is at least at moderate risk for complications without adequate follow up. This format is felt to be most appropriate for this patient at this time. Physical exam was limited by quality of the video and audio technology used for the visit. CMA was able to get the patient set up on a video visit.  Patient location: Home. Patient and provider in visit Provider location: Office  I discussed the limitations of evaluation and management by telemedicine and the availability of in person appointments. The patient expressed understanding and agreed to proceed.  Visit Date: 08/18/2022  Today's healthcare provider: Hetty Blend, NP-C     Subjective:    Patient ID: Jason Meyer, male    DOB: 09-26-1961, 61 y.o.   MRN: [id number]  Chief Complaint  Patient presents with   Covid Positive    9/7 Sx include: stuffy nose, cough, low grade fever last night    HPI  Complains of fever, headache, nasal congestion and mild cough.  Tested positive for Covid yesterday.   Denies fever, chills, dizziness, chest pain, palpitations, shortness of breath, abdominal pain, N/V/D.    Never had Covid before.  He had 2 vaccines.    Past Medical History:  Diagnosis Date   Allergy    Arthritis    Chronic diastolic heart failure (HCC) 08/11/2022   Admitted with acute hypoxemic respiratory failure in the setting of acute diastolic CHF exacerbation      Hyperlipidemia    Hypertension    Sleep apnea    Type 2 diabetes mellitus (HCC)     Past Surgical History:  Procedure Laterality Date   NO PAST SURGERIES      Family History  Problem Relation Age of Onset   Alzheimer's disease Mother    Hypertension  Father    Heart disease Father    Heart attack Father    Stroke Father    Heart attack Brother     Social History   Socioeconomic History   Marital status: Married    Spouse name: Not on file   Number of children: Not on file   Years of education: Not on file   Highest education level: Not on file  Occupational History   Not on file  Tobacco Use   Smoking status: Former    Packs/day: 1.00    Years: 33.00    Total pack years: 33.00    Types: Cigarettes    Quit date: 12/12/2007    Years since quitting: 14.6   Smokeless tobacco: Never  Vaping Use   Vaping Use: Never used  Substance and Sexual Activity   Alcohol use: Not Currently   Drug use: No   Sexual activity: Not Currently  Other Topics Concern   Not on file  Social History Narrative   Not on file   Social Determinants of Health   Financial Resource Strain: Not on file  Food Insecurity: Not on file  Transportation Needs: Not on file  Physical Activity: Not on file  Stress: Not on file  Social Connections: Not on file  Intimate Partner Violence: Not on file    Outpatient Medications Prior to Visit  Medication Sig Dispense Refill   albuterol (PROVENTIL) (2.5 MG/3ML) 0.083% nebulizer  solution INHALE CONTENTS OF 1 VIAL VIA NEBULIZER EVERY 6 HOURS AS NEEDED (Patient taking differently: Take 2.5 mg by nebulization every 6 (six) hours as needed for wheezing or shortness of breath.) 375 mL 0   albuterol (VENTOLIN HFA) 108 (90 Base) MCG/ACT inhaler TAKE 2 PUFFS BY MOUTH EVERY 6 HOURS AS NEEDED FOR WHEEZE OR SHORTNESS OF BREATH (Patient taking differently: Inhale 2 puffs into the lungs every 6 (six) hours as needed for wheezing or shortness of breath.) 8.5 each 2   amLODipine (NORVASC) 10 MG tablet Take 1 tablet (10 mg total) by mouth daily. 90 tablet 3   atorvastatin (LIPITOR) 80 MG tablet Take 1 tablet (80 mg total) by mouth daily. 90 tablet 3   azelastine (ASTELIN) 0.1 % nasal spray Place 1 spray into both nostrils 2  (two) times daily. Use in each nostril as directed 30 mL 12   bisoprolol (ZEBETA) 10 MG tablet Take 2 tablets (20 mg total) by mouth daily. 180 tablet 3   clotrimazole-betamethasone (LOTRISONE) cream Apply 1 Application topically 2 (two) times daily. 30 g 0   diclofenac (VOLTAREN) 50 MG EC tablet TAKE 1 TABLET BY MOUTH TWICE A DAY 60 tablet 1   fluticasone (FLONASE) 50 MCG/ACT nasal spray Place 2 sprays into both nostrils daily. 16 g 6   fluticasone furoate-vilanterol (BREO ELLIPTA) 100-25 MCG/ACT AEPB Inhale 1 puff into the lungs daily. 1 each 11   furosemide (LASIX) 20 MG tablet Take 1 tablet (20 mg total) by mouth daily. Take extra 20mg  for 3 pound weight gain in 24 hours, or swelling or shortness of breath 30 tablet 3   guaiFENesin (MUCINEX) 600 MG 12 hr tablet Take 1 tablet (600 mg total) by mouth 2 (two) times daily. 60 tablet 2   hydrALAZINE (APRESOLINE) 50 MG tablet Take 1 tablet (50 mg total) by mouth 2 (two) times daily. 180 tablet 1   metFORMIN (GLUCOPHAGE) 1000 MG tablet Take 1 tablet (1,000 mg total) by mouth 2 (two) times daily with a meal. 180 tablet 3   olmesartan (BENICAR) 40 MG tablet Take 1 tablet (40 mg total) by mouth daily. 30 tablet 11   potassium chloride (MICRO-K) 10 MEQ CR capsule Take 1 capsule (10 mEq total) by mouth daily. 30 capsule 3   sildenafil (REVATIO) 20 MG tablet 2-5 pills once daily as needed for erectile dysfunction 50 tablet 3   tamsulosin (FLOMAX) 0.4 MG CAPS capsule Take 0.4 mg by mouth daily.     tirzepatide [facility]) 2.5 MG/0.5ML Pen Inject 2.5 mg into the skin once a week. Replaces trulicity.  For morbid obesity in setting of diabetes type 2 (BMI 53.9) 2 mL 5   No facility-administered medications prior to visit.    Allergies  Allergen Reactions   Penicillins Swelling    ROS     Objective:    Physical Exam  There were no vitals taken for this visit. Wt Readings from Last 3 Encounters:  08/11/22 (!) 386 lb 6.4 oz (175.3 kg)  08/01/22 (!)  384 lb 6.4 oz (174.4 kg)  07/14/22 (!) 399 lb (181 kg)   Alert and oriented and in no acute distress. Normal respirations. Talking in complete sentences.     Assessment & Plan:   Problem List Items Addressed This Visit   None Visit Diagnoses     COVID-19 virus infection    -  Primary   Relevant Medications   molnupiravir EUA (LAGEVRIO) 200 MG CAPS capsule   Acute cough  No distress. Requests antiviral. Paxlovid initially prescribed but due to use of sildenafil prn, I attempted to call patient back to advise him to avoid this medication while on Paxlovid due to potential adverse interaction. He did not answer after several attempts and phone was "out of service". Switched medication to molnupiravir.  Discussed recommended guidelines for quarantine and isolation. Follow up as needed.   I have discontinued Sachin L. Abraha's nirmatrelvir/ritonavir EUA. I am also having him start on molnupiravir EUA. Additionally, I am having him maintain his fluticasone, sildenafil, tamsulosin, olmesartan, azelastine, bisoprolol, guaiFENesin, amLODipine, metFORMIN, fluticasone furoate-vilanterol, albuterol, albuterol, hydrALAZINE, atorvastatin, potassium chloride, furosemide, diclofenac, clotrimazole-betamethasone, and tirzepatide.  Meds ordered this encounter  Medications   DISCONTD: nirmatrelvir/ritonavir EUA (PAXLOVID) 20 x 150 MG & 10 x 100MG  TABS    Sig: Take 3 tablets by mouth 2 (two) times daily for 5 days. (Take nirmatrelvir 150 mg two tablets twice daily for 5 days and ritonavir 100 mg one tablet twice daily for 5 days) Patient GFR is >60    Dispense:  30 tablet    Refill:  0    Order Specific Question:   Supervising Provider    Answer:   A [[id number]]   molnupiravir EUA (LAGEVRIO) 200 MG CAPS capsule    Sig: Take 4 capsules (800 mg total) by mouth 2 (two) times daily for 5 days.    Dispense:  40 capsule    Refill:  0    Order Specific Question:   Supervising Provider     Answer:   Hillard Danker A [[id number]]    I discussed the assessment and treatment plan with the patient. The patient was provided an opportunity to ask questions and all were answered. The patient agreed with the plan and demonstrated an understanding of the instructions.   The patient was advised to call back or seek an in-person evaluation if the symptoms worsen or if the condition fails to improve as anticipated.  I provided 15 minutes of face-to-face time during this encounter.   Hillard Danker, NP-C [facility] [place] [phone] (phone) [phone] (fax)  [facility]

## 2022-08-18 NOTE — Patient Instructions (Signed)
Avoid taking sildenafil with Paxlovid.    Quarantine and Scientist, product/process development and isolation refer to local and travel restrictions to protect the public and travelers from contagious diseases that constitute a public health threat. Contagious diseases are diseases that can spread from one person to another. Quarantine and isolation help to protect the public by preventing exposure to people who have or may have a contagious disease. Isolation separates people who are sick with a contagious disease from people who are not sick. Quarantine separates and restricts the movement of people who were exposed to a contagious disease to see if they become sick. You may be put in quarantine or isolation if you have been exposed to or diagnosed with any of the following diseases: Severe acute respiratory syndromes, such as COVID-19. Cholera. Diphtheria. Tuberculosis. Plague. Smallpox. Yellow fever. Viral hemorrhagic fevers, such as [place], Ebola, and [place]. When to quarantine or isolate Follow these rules, whether you have been vaccinated or not: Stay home and isolate from others when you are sick with a contagious disease. Isolate when you test positive for a contagious disease, even if you do not have symptoms. Isolate if you are sick and suspect that you may have a contagious disease. If you suspect that you have a contagious disease, get tested. If your test results are negative, you can end your isolation. If your test results are positive, follow the full isolation recommendations as told by your health care provider or local health authorities. Quarantine and stay away from others when you have been in close contact with someone who has tested positive for a contagious disease. Close contact is defined as being less than 6 ft (1.8 m) away from an infected person for a total of 15 minutes or more over a 24-hour period. Do not go to places where you are unable to wear a mask, such as  restaurants and some gyms. Stay home and separate from others as much as possible. Avoid being around people who may get very sick from the contagious disease that you have. Use a separate bathroom, if possible. Do not travel. For travel guidance, visit the CDC's travel webpage at http://espinoza.biz/ Follow these instructions at home: Medicines  Take over-the-counter and prescription medicines as told by your health care provider. Finish all antibiotic medicine even when you start to feel better. Stay up to date with all your vaccines. Get scheduled vaccines and boosters as recommended by your health care provider. Lifestyle Wear a high-quality mask if you must be around others at home and in public, if recommended. Improve air flow (ventilation) at home to help prevent the disease from spreading to other people, if possible. Do not share personal household items, like cups, towels, and utensils. Practice everyday hygiene and cleaning. General instructions Talk to your health care provider if you have a weakened body defense system (immune system). People with a weakened immune system may have a reduced immune response to vaccines. You may need to follow current prevention measures, including wearing a well-fitting mask, avoiding crowds, and avoiding poorly ventilated indoor places. Monitor symptoms and follow health care provider instructions, which may include resting, drinking fluids, and taking medicines. Follow specific isolation and quarantine recommendations if you are in places that can lead to disease outbreaks, such as correctional and detention facilities, homeless shelters, and cruise ships. Return to your normal activities as told by your health care provider. Ask your health care provider what activities are safe for you. Keep all follow-up visits. This is important.  Where to find more information CDC: https://martinez.com/ Contact a health care provider if: You  have a fever. You have signs and symptoms that return or get worse after isolation. Get help right away if: You have difficulty breathing. You have chest pain. These symptoms may be an emergency. Get help right away. Call 911. Do not wait to see if the symptoms will go away. Do not drive yourself to the hospital. Summary Isolation and quarantine help protect the public by preventing exposure to people who have or may have a contagious disease. Isolate when you are sick or when you test positive, even if you do not have symptoms. Quarantine and stay away from others when you have been in close contact with someone who has tested positive for a contagious disease. This information is not intended to replace advice given to you by your health care provider. Make sure you discuss any questions you have with your health care provider. Document Revised: 12/08/2021 Document Reviewed: 11/17/2021 Elsevier Patient Education  2023 [employer].

## 2022-08-23 ENCOUNTER — Encounter: Payer: Self-pay | Admitting: Orthopedic Surgery

## 2022-08-23 ENCOUNTER — Ambulatory Visit: Payer: 59

## 2022-08-23 ENCOUNTER — Ambulatory Visit (INDEPENDENT_AMBULATORY_CARE_PROVIDER_SITE_OTHER): Payer: 59

## 2022-08-23 ENCOUNTER — Ambulatory Visit (INDEPENDENT_AMBULATORY_CARE_PROVIDER_SITE_OTHER): Payer: 59 | Admitting: Orthopedic Surgery

## 2022-08-23 VITALS — Ht 71.0 in | Wt 388.0 lb

## 2022-08-23 DIAGNOSIS — M170 Bilateral primary osteoarthritis of knee: Secondary | ICD-10-CM | POA: Diagnosis not present

## 2022-08-23 DIAGNOSIS — G8929 Other chronic pain: Secondary | ICD-10-CM

## 2022-08-23 DIAGNOSIS — M25561 Pain in right knee: Secondary | ICD-10-CM

## 2022-08-23 DIAGNOSIS — M25562 Pain in left knee: Secondary | ICD-10-CM

## 2022-08-23 DIAGNOSIS — Z6841 Body Mass Index (BMI) 40.0 and over, adult: Secondary | ICD-10-CM

## 2022-08-23 MED ORDER — METHYLPREDNISOLONE ACETATE 40 MG/ML IJ SUSP
40.0000 mg | Freq: Once | INTRAMUSCULAR | Status: AC
  Administered 2022-08-23: 40 mg via INTRA_ARTICULAR

## 2022-08-23 MED ORDER — MELOXICAM 7.5 MG PO TABS: 7.5000 mg | ORAL_TABLET | Freq: Every day | ORAL | 5 refills | Status: DC

## 2022-08-23 NOTE — Progress Notes (Signed)
Chief Complaint  Patient presents with   Knee Pain    Bilateral/ the left one pops and sounds crunchy.    HPI: 61 year old male presents with bilateral knee pain left worse than right which has had for years.  He has severe and significant medical problems listed below along with a BMI of 54.  He says he has only had 1 medication diclofenac 50 mg for knee pain and it does not help    Past Medical History:  Diagnosis Date   Allergy    Arthritis    Chronic diastolic heart failure (HCC) 08/11/2022   Admitted with acute hypoxemic respiratory failure in the setting of acute diastolic CHF exacerbation      Hyperlipidemia    Hypertension    Sleep apnea    Type 2 diabetes mellitus (HCC)     Ht 5\' 11"  (1.803 m)   Wt (!) 388 lb (176 kg)   BMI 54.12 kg/m  The patient meets the AMA guidelines for Morbid (severe) obesity with a BMI > 40.0 and I have recommended weight loss. Right knee exam he does not come to full extension but his flexion is at least 120 degrees he is tender on the medial compartment he has a varus alignment he has severe edema and wears TED hose  Left knee exam similar  He has a slightly bigger flexion contracture but again the knee flexion is 120 degrees there is medial joint line tenderness with varus alignment he has severe leg edema as well.  No other gross deformity  Skin seems to be intact  Imaging both knees were imaged.  The right knee shows moderate to severe varus alignment with medial joint space narrowing to the point of bone-on-bone and there are peripheral osteophytes throughout the knee  This is grade 4 disease bilaterally  A/P  He is not a surgical candidate based on his medical history and obesity  He would have to lose upwards of 100 pounds to qualify for BMI less than 40 and his hemoglobin A1c would have to be less than 7 or at least 7-1/2  I changed him over to meloxicam and gave him 2 injections  There is not much else we can do for this  gentleman at 61 years old with this weight  Procedure note for bilateral knee injections  Procedure note left knee injection verbal consent was obtained to inject left knee joint  Timeout was completed to confirm the site of injection  The medications used were 40 mg depomedrol and 3 cc of 1% lidocaine  Anesthesia was provided by ethyl chloride and the skin was prepped with alcohol.  After cleaning the skin with alcohol a 20-gauge needle was used to inject the left knee joint. There were no complications. A sterile bandage was applied.   Procedure note right knee injection verbal consent was obtained to inject right knee joint  Timeout was completed to confirm the site of injection  The medications used were 40 mg depomedrol and 3 cc of 1% lidocaine  Anesthesia was provided by ethyl chloride and the skin was prepped with alcohol.  After cleaning the skin with alcohol a 20-gauge needle was used to inject the right knee joint. There were no complications. A sterile bandage was applied.

## 2022-08-23 NOTE — Patient Instructions (Addendum)
Start your new arthritis medication today // stop diclofenac  Start over the counter capzacin to rub on the knees every evening   Take 500 mg of tylenol every 6 hrs as needed for knee pain

## 2022-08-29 ENCOUNTER — Telehealth: Payer: Self-pay | Admitting: Internal Medicine

## 2022-08-29 MED ORDER — TIRZEPATIDE 2.5 MG/0.5ML ~~LOC~~ SOAJ: 2.5000 mg | SUBCUTANEOUS | 11 refills | Status: DC

## 2022-08-29 NOTE — Telephone Encounter (Signed)
Left vm informing patient that the new monjuaro prescription has been sent in today.

## 2022-08-29 NOTE — Telephone Encounter (Signed)
Patient states insurance will not cover wegovy but will cover mounjaro.  States pharmacy is requesting a new script for mounjaro to be sent to them.  Patient uses [facility] in [place].  Please follow up with patient once sent.

## 2022-09-05 ENCOUNTER — Encounter: Payer: Self-pay | Admitting: *Deleted

## 2022-09-11 ENCOUNTER — Ambulatory Visit: Payer: 59 | Admitting: Internal Medicine

## 2022-09-14 ENCOUNTER — Ambulatory Visit (INDEPENDENT_AMBULATORY_CARE_PROVIDER_SITE_OTHER): Payer: 59 | Admitting: Internal Medicine

## 2022-09-14 ENCOUNTER — Encounter: Payer: Self-pay | Admitting: Internal Medicine

## 2022-09-14 DIAGNOSIS — M170 Bilateral primary osteoarthritis of knee: Secondary | ICD-10-CM | POA: Diagnosis not present

## 2022-09-14 MED ORDER — BUPROPION HCL ER (XL) 150 MG PO TB24: 150.0000 mg | ORAL_TABLET | Freq: Every morning | ORAL | 3 refills | Status: DC

## 2022-09-14 NOTE — Patient Instructions (Signed)
It was a pleasure seeing you today!  Today the plan is...  Morbid obesity ([place]) Assessment & Plan: Has mounjaro but just now starting @2 .5 We discussed bariatrics surgeries and the differences, offered to refer. Has history of taking trulicity and wegovy got refused. 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 Also taking metformin for this and diabetes. Encouraged him to check sugar.   weight not improved yet due to just starting mounjaro.   Urged him to take his fear of weight loss surgery and apply it towards making sure that he can lose the weight with just the medicine and heart exercise.  I clarified that the exercise needs to be resistance training not just cardio turn up the dial on the exercise bike so that is got a lot of resistance maybe get some resistance rubber bands  11 carbs so I asked him to at least limit his carbs all to 1 meal per day and then for the rest of the day no carb snacks but he is fine to have protein snacks like meat and cheese slices  Advised Him to stop the Wellbutrin if he is losing weight faster than 10 pounds a week Wt Readings from Last 3 Encounters:  09/14/22 (!) 389 lb 6.4 oz (176.6 kg)  08/23/22 (!) 388 lb (176 kg)  08/11/22 (!) 386 lb 6.4 oz (175.3 kg)     Osteoarthritis of both knees, unspecified osteoarthritis type Assessment & Plan: He has chronic knee pain in both knees and he has not had either one of them replaced I encouraged him to continue with sports medicine and I advised him that I think the most valuable thing for stalling on getting a knee replacement will be continued injections and weight loss      Loralee Pacas, MD   Return in about 1 month (around 10/15/2022) for intensive weight loss, possible disability support..   - If your condition fails to resolve or you have other questions / concerns: please contact me via phone [phone] or MyChart messaging.  - Please bring all your medicines to your next appointment. This is  the best way for me to know exactly what you're taking.  - If your condition begins to worsen or become severe:  go to the ER.   IF you received an x-ray today, you will receive an invoice from [facility]. Please contact [facility] at [phone] with questions or concerns regarding your invoice.    IF you received labwork today, you will receive an invoice from [facility]. Please contact [facility] at [phone] with questions or concerns regarding your invoice.    Our billing staff will not be able to assist you with questions regarding bills from these companies.   --------------------------------------------------------------------------------------------------------------------  You will be contacted with the lab results as soon as they are available. The fastest way to get your results is to activate your My Chart account. Instructions are located on the last page of this paperwork. If you have not heard from [place] regarding the results in 2 weeks, please contact this office. For any labs or imaging tests, we will call you if the results are significantly abnormal.  Most normal results will be posted to myChart as soon as they are available and I will comment on them there within 2-3 business days.        Diabetes Mellitus and Exercise Exercising regularly is important for overall health, especially for people who have diabetes mellitus. Exercising is not only about losing weight.  It has many other health benefits, such as increasing muscle strength and bone density and reducing body fat and stress. This leads to improved fitness, flexibility, and endurance, all of which result in better overall health. What are the benefits of exercise if I have diabetes? Exercise has many benefits for people with diabetes. They include: Helping to lower and control blood sugar (glucose). Helping the body to respond better to the hormone insulin by improving insulin sensitivity. Reducing  how much insulin the body needs. Lowering the risk for heart disease by: Lowering "bad" cholesterol and triglyceride levels. Increasing "good" cholesterol levels. Lowering blood pressure. Lowering blood glucose levels. What is my activity plan? Your health care provider or certified diabetes educator can help you make a plan for the type and frequency of exercise that works for you. This is called your activity plan. Be sure to: Get at least 150 minutes of medium-intensity or high-intensity exercise each week. Exercises may include brisk walking, biking, or water aerobics. Do stretching and strengthening exercises, such as yoga or weight lifting, at least 2 times a week. Spread out your activity over at least 3 days of the week. Get some form of physical activity each day. Do not go more than 2 days in a row without some kind of physical activity. Avoid being inactive for more than 90 minutes at a time. Take frequent breaks to walk or stretch. Choose exercises or activities that you enjoy. Set realistic goals. Start slowly and gradually increase your exercise intensity over time. How do I manage my diabetes during exercise?  Monitor your blood glucose Check your blood glucose before and after exercising. If your blood glucose is: 240 mg/dL (13.3 mmol/L) or higher before you exercise, check your urine for ketones. These are chemicals created by the liver. If you have ketones in your urine, do not exercise until your blood glucose returns to normal. 100 mg/dL (5.6 mmol/L) or lower, eat a snack containing 15-20 grams of carbohydrate. Check your blood glucose 15 minutes after the snack to make sure that your glucose level is above 100 mg/dL (5.6 mmol/L) before you start your exercise. Know the symptoms of low blood glucose (hypoglycemia) and how to treat it. Your risk for hypoglycemia increases during and after exercise. Follow these tips and your health care provider's instructions Keep a  carbohydrate snack that is fast-acting for use before, during, and after exercise to help prevent or treat hypoglycemia. Avoid injecting insulin into areas of the body that are going to be exercised. For example, avoid injecting insulin into: Your arms, when you are about to play tennis. Your legs, when you are about to go jogging. Keep records of your exercise habits. Doing this can help you and your health care provider adjust your diabetes management plan as needed. Write down: Food that you eat before and after you exercise. Blood glucose levels before and after you exercise. The type and amount of exercise you have done. Work with your health care provider when you start a new exercise or activity. He or she may need to: Make sure that the activity is safe for you. Adjust your insulin, other medicines, and food that you eat. Drink plenty of water while you exercise. This prevents loss of water (dehydration) and problems caused by a lot of heat in the body (heat stroke). Where to find more information American Diabetes Association: www.diabetes.org Summary Exercising regularly is important for overall health, especially for people who have diabetes mellitus. Exercising has many health  benefits. It increases muscle strength and bone density and reduces body fat and stress. It also lowers and controls blood glucose. Your health care provider or certified diabetes educator can help you make an activity plan for the type and frequency of exercise that works for you. Work with your health care provider to make sure any new activity is safe for you. Also work with your health care provider to adjust your insulin, other medicines, and the food you eat. This information is not intended to replace advice given to you by your health care provider. Make sure you discuss any questions you have with your health care provider. Document Revised: 08/25/2019 Document Reviewed: 08/25/2019 Elsevier Patient  Education  [place].   Obesity, Adult Obesity is having too much body fat. Being obese means that your weight is more than what is healthy for you.  BMI (body mass index) is a number that explains how much body fat you have. If you have a BMI of 30 or more, you are obese. Obesity can cause serious health problems, such as: Stroke. Coronary artery disease (CAD). Type 2 diabetes. Some types of cancer. High blood pressure (hypertension). High cholesterol. Gallbladder stones. Obesity can also contribute to: Osteoarthritis. Sleep apnea. Infertility problems. What are the causes? Eating meals each day that are high in calories, sugar, and fat. Drinking a lot of drinks that have sugar in them. Being born with genes that may make you more likely to become obese. Having a medical condition that causes obesity. Taking certain medicines. Sitting a lot (having a sedentary lifestyle). Not getting enough sleep. What increases the risk? Having a family history of obesity. Living in an area with limited access to: [place], recreation centers, or sidewalks. Healthy food choices, such as grocery stores and farmers' markets. What are the signs or symptoms? The main sign is having too much body fat. How is this treated? Treatment for this condition often includes changing your lifestyle. Treatment may include: Changing your diet. This may include making a healthy meal plan. Exercise. This may include activity that causes your heart to beat faster (aerobic exercise) and strength training. Work with your doctor to design a program that works for you. Medicine to help you lose weight. This may be used if you are not able to lose one pound a week after 6 weeks of healthy eating and more exercise. Treating conditions that cause the obesity. Surgery. Options may include gastric banding and gastric bypass. This may be done if: Other treatments have not helped to improve your condition. You have  a BMI of 40 or higher. You have life-threatening health problems related to obesity. Follow these instructions at home: Eating and drinking  Follow advice from your doctor about what to eat and drink. Your doctor may tell you to: Limit fast food, sweets, and processed snack foods. Choose low-fat options. For example, choose low-fat milk instead of whole milk. Eat five or more servings of fruits or vegetables each day. Eat at home more often. This gives you more control over what you eat. Choose healthy foods when you eat out. Learn to read food labels. This will help you learn how much food is in one serving. Keep low-fat snacks available. Avoid drinks that have a lot of sugar in them. These include soda, fruit juice, iced tea with sugar, and flavored milk. Drink enough water to keep your pee (urine) pale yellow. Do not go on fad diets. Physical activity Exercise often, as told by your  doctor. Most adults should get up to 150 minutes of moderate-intensity exercise every week.Ask your doctor: What types of exercise are safe for you. How often you should exercise. Warm up and stretch before being active. Do slow stretching after being active (cool down). Rest between times of being active. Lifestyle Work with your doctor and a food expert (dietitian) to set a weight-loss goal that is best for you. Limit your screen time. Find ways to reward yourself that do not involve food. Do not drink alcohol if: Your doctor tells you not to drink. You are pregnant, may be pregnant, or are planning to become pregnant. If you drink alcohol: Limit how much you have to: 0-1 drink a day for women. 0-2 drinks a day for men. Know how much alcohol is in your drink. In the [place] one drink equals one 12 oz bottle of beer (355 mL), one 5 oz glass of wine (148 mL), or one 1 oz glass of hard liquor (44 mL). General instructions Keep a weight-loss journal. This can help you keep track of: The food that you  eat. How much exercise you get. Take over-the-counter and prescription medicines only as told by your doctor. Take vitamins and supplements only as told by your doctor. Think about joining a support group. Pay attention to your mental health as obesity can lead to depression or self esteem issues. Keep all follow-up visits. Contact a doctor if: You cannot meet your weight-loss goal after you have changed your diet and lifestyle for 6 weeks. You are having trouble breathing. Summary Obesity is having too much body fat. Being obese means that your weight is more than what is healthy for you. Work with your doctor to set a weight-loss goal. Get regular exercise as told by your doctor. This information is not intended to replace advice given to you by your health care provider. Make sure you discuss any questions you have with your health care provider. Document Revised: 07/05/2021 Document Reviewed: 07/05/2021 Elsevier Patient Education  [place].

## 2022-09-14 NOTE — Assessment & Plan Note (Addendum)
Has mounjaro but just now starting @2 .5 We discussed bariatrics surgeries and the differences, offered to refer. Has history of taking trulicity and wegovy got refused. 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 Also taking metformin for this and diabetes. Encouraged him to check sugar.   weight not improved yet due to just starting mounjaro.   Urged him to take his fear of weight loss surgery and apply it towards making sure that he can lose the weight with just the medicine and heart exercise.  I clarified that the exercise needs to be resistance training not just cardio turn up the dial on the exercise bike so that is got a lot of resistance maybe get some resistance rubber bands  11 carbs so I asked him to at least limit his carbs all to 1 meal per day and then for the rest of the day no carb snacks but he is fine to have protein snacks like meat and cheese slices  I have had an extensive 30 minute conversation today with the patient about healthy eating habits, exercise, calorie and carb goals for sustainable and successful weight loss. I gave the patient caloric and protein daily intake values as well as described the importance of increasing fiber and water intake. I discussed weight loss medications that could be used in the treatment (mounjaro/metformin/wellbutrin) in addition to describing the available surgical approaches in detail. Handouts on obesity and exercise were included in the AVS.   Advised Him to stop the Wellbutrin if he is losing weight faster than 10 pounds a week Wt Readings from Last 3 Encounters:  09/14/22 (!) 389 lb 6.4 oz (176.6 kg)  08/23/22 (!) 388 lb (176 kg)  08/11/22 (!) 386 lb 6.4 oz (175.3 kg)

## 2022-09-14 NOTE — Assessment & Plan Note (Signed)
He has chronic knee pain in both knees and he has not had either one of them replaced I encouraged him to continue with sports medicine and I advised him that I think the most valuable thing for stalling on getting a knee replacement will be continued injections and weight loss

## 2022-09-14 NOTE — Progress Notes (Signed)
Adult nurse Healthcare at [facility]:  [phone]   Routine Medical Office Visit  Patient:  Jason Meyer      Age: 61 y.o.       Sex:  male  Date:   09/14/2022  PCP:    [name], MD    Today's Healthcare Provider: [name], MD  Assessment/Plan:   Jason Meyer was seen today for 1 month follow-up.  Morbid obesity (HCC) Overview: Body mass index is 53.89 kg/m.  Has tried a salad based diet in the past but no formal diet like weight watchers Never been on any medication for weight loss as of 09/14/2022 but will order to start trying His skin he would be scared to have weight loss surgery   Assessment & Plan: Has mounjaro but just now starting @2 .5 We discussed bariatrics surgeries and the differences, offered to refer. Has history of taking trulicity and wegovy got refused. He is working bhand and . Also taking metformin for this and diabetes. Encouraged him to check sugar.   weight not improved yet due to just starting mounjaro.   Urged him to take his fear of weight loss surgery and apply it towards making sure that he can lose the weight with just the medicine and heart exercise.  I clarified that the exercise needs to be resistance training not just cardio turn up the dial on the exercise bike so that is got a lot of resistance maybe get some resistance rubber bands  11 carbs so I asked him to at least limit his carbs all to 1 meal per day and then for the rest of the day no carb snacks but he is fine to have protein snacks like meat and cheese slices  Advised Him to stop the Wellbutrin if he is losing weight faster than 10 pounds a week Wt Readings from Last 3 Encounters:  09/14/22 (!) 389 lb 6.4 oz (176.6 kg)  08/23/22 (!) 388 lb (176 kg)  08/11/22 (!) 386 lb 6.4 oz (175.3 kg)     Osteoarthritis of both knees, unspecified osteoarthritis type Overview: Was seen by sports medicine and had injections in both knees in September 2023 and it  helped some   Assessment & Plan: He has chronic knee pain in both knees and he has not had either one of them replaced I encouraged him to continue with sports medicine and I advised him that I think the most valuable thing for stalling on getting a knee replacement will be continued injections and weight loss     Return in about 1 month (around 10/15/2022) for intensive weight loss, possible disability support..    Medication Discussion: Common side effects, risks, benefits, and alternatives for medications and treatment plan prescribed today were discussed, and he expressed understanding of the given instructions.   Follow Up Treatment Plan Discussion: He is encouraged to call or message via MyChart if he has any questions or concerns regarding our treatment plan.  No barriers to understanding were identified Additional information was provided in the AVS (see AVS) regarding the diagnosis/treatment plan for him to review and this note is shared on his MyChart for review.    Subjective:   Jason Meyer is a 61 y.o. male with PMH significant for: Past Medical History:  Diagnosis Date   Allergy    Arthritis    Chronic diastolic heart failure (HCC) 08/11/2022   Admitted with acute hypoxemic respiratory failure in the setting of acute diastolic CHF exacerbation  Hyperlipidemia    Hypertension    Sleep apnea    Type 2 diabetes mellitus (HCC)     He is presenting today with: Chief Complaint  Patient presents with   1 month follow-up    For review of response to fluid pills, weight loss meds, dermatitis meds and any other problems. Everything is better per pt.     Additional physician collected history: This month for exercise he has been doing like a hand and foot exercise bike and he has been taking Trulicity for most of the month but he finally got [facility]o [facility]  He has never tried Wellbutrin but is willing to He is scared of weight loss surgery  and so            Objective:  Physical Exam: BP (!) 144/77 (BP Location: Left Arm, Patient Position: Sitting)   Pulse 71   Temp 97.8 F (36.6 C) (Temporal)   Resp 16   Ht 5\' 11"  (1.803 m)   Wt (!) 389 lb 6.4 oz (176.6 kg)   SpO2 92%   BMI 54.31 kg/m   He  is a polite, friendly, and genuine person - fantastic smile, very kind.  Has some visible anxiety about possible surgery and ability to continue working with his health conditions. Constitutional: NAD, AAO, not ill-appearing  Neuro: alert, no focal deficit obvious, articulate speech Psych: normal mood, behavior, thought content   Problem specific physical exam findings:  Morbid truncal adiposity   Results:  No results found for any visits on 09/14/22.   Recent Results (from the past 2160 hour(s))  Bayer DCA Hb A1c Waived     Status: Abnormal   Collection Time: 07/14/22  2:23 PM  Result Value Ref Range   HB A1C (BAYER DCA - WAIVED) 5.9 (H) 4.8 - 5.6 %    Comment:          Prediabetes: 5.7 - 6.4          Diabetes: >6.4          Glycemic control for adults with diabetes: <7.0   Basic metabolic panel     Status: Abnormal   Collection Time: 07/30/22 11:17 AM  Result Value Ref Range   Sodium 140 135 - 145 mmol/L   Potassium 4.0 3.5 - 5.1 mmol/L   Chloride 108 98 - 111 mmol/L   CO2 25 22 - 32 mmol/L   Glucose, Bld 117 (H) 70 - 99 mg/dL    Comment: Glucose reference range applies only to samples taken after fasting for at least 8 hours.   BUN 12 6 - 20 mg/dL   Creatinine, Ser 1.611.00 0.61 - 1.24 mg/dL   Calcium 8.9 8.9 - 09.610.3 mg/dL   GFR, Estimated >04>60 >54>60 mL/min    Comment: (NOTE) Calculated using the CKD-EPI Creatinine Equation (2021)    Anion gap 7 5 - 15    Comment: Performed at [facility], [address] [place], [place] [address]  CBC with Differential     Status: Abnormal   Collection Time: 07/30/22 11:17 AM  Result Value Ref Range   WBC 12.5 (H) 4.0 - 10.5 K/uL   RBC 5.62 4.22 - 5.81 MIL/uL   Hemoglobin  14.4 13.0 - 17.0 g/dL   HCT 91.445.3 78.239.0 - 95.652.0 %   MCV 80.6 80.0 - 100.0 fL   MCH 25.6 (L) 26.0 - 34.0 pg   MCHC 31.8 30.0 - 36.0 g/dL   RDW 21.317.2 (H) 08.611.5 -  15.5 %   Platelets 269 150 - 400 K/uL   nRBC 0.0 0.0 - 0.2 %   Neutrophils Relative % 85 %   Neutro Abs 10.5 (H) 1.7 - 7.7 K/uL   Lymphocytes Relative 8 %   Lymphs Abs 1.1 0.7 - 4.0 K/uL   Monocytes Relative 5 %   Monocytes Absolute 0.6 0.1 - 1.0 K/uL   Eosinophils Relative 2 %   Eosinophils Absolute 0.3 0.0 - 0.5 K/uL   Basophils Relative 0 %   Basophils Absolute 0.1 0.0 - 0.1 K/uL   Immature Granulocytes 0 %   Abs Immature Granulocytes 0.05 0.00 - 0.07 K/uL    Comment: Performed at [facility], [address], [place], [place] [address]  Troponin I (High Sensitivity)     Status: None   Collection Time: 07/30/22 11:17 AM  Result Value Ref Range   Troponin I (High Sensitivity) 7 <18 ng/L    Comment: (NOTE) Elevated high sensitivity troponin I (hsTnI) values and significant  changes across serial measurements may suggest ACS but many other  chronic and acute conditions are known to elevate hsTnI results.  Refer to the "Links" section for chest pain algorithms and additional  guidance. Performed at [facility], [address] [place], [place] [address]   Brain natriuretic peptide     Status: None   Collection Time: 07/30/22 11:17 AM  Result Value Ref Range   B Natriuretic Peptide 61.0 0.0 - 100.0 pg/mL    Comment: Performed at [facility] [address] [place], [place] [address]  D-dimer, quantitative     Status: Abnormal   Collection Time: 07/30/22 11:17 AM  Result Value Ref Range   D-Dimer, Quant 0.66 (H) 0.00 - 0.50 ug/mL-FEU    Comment: (NOTE) At the manufacturer cut-off value of 0.5 g/mL FEU, this assay has a negative predictive value of 95-100%.This assay is intended for use in conjunction with a clinical pretest probability (PTP) assessment model to exclude pulmonary embolism (PE) and deep venous  thrombosis (DVT) in outpatients suspected of PE or DVT. Results should be correlated with clinical presentation. Performed at [facility], [address], [place], [place] [address]   SARS Coronavirus 2 by RT PCR (hospital order, performed in [facility] hospital lab) *cepheid single result test* Anterior Nasal Swab     Status: None   Collection Time: 07/30/22 11:28 AM   Specimen: Anterior Nasal Swab  Result Value Ref Range   SARS Coronavirus 2 by RT PCR NEGATIVE NEGATIVE    Comment: (NOTE) SARS-CoV-2 target nucleic acids are NOT DETECTED.  The SARS-CoV-2 RNA is generally detectable in upper and lower respiratory specimens during the acute phase of infection. The lowest concentration of SARS-CoV-2 viral copies this assay can detect is 250 copies / mL. A negative result does not preclude SARS-CoV-2 infection and should not be used as the sole basis for treatment or other patient management decisions.  A negative result may occur with improper specimen collection / handling, submission of specimen other than nasopharyngeal swab, presence of viral mutation(s) within the areas targeted by this assay, and inadequate number of viral copies (<250 copies / mL). A negative result must be combined with clinical observations, patient history, and epidemiological information.  Fact Sheet for Patients:   RoadLapTop.co.za  Fact Sheet for Healthcare Providers: http://kim-miller.com/  This test is not yet approved or  cleared by the [place] FDA and has been authorized for detection and/or diagnosis of SARS-CoV-2 by FDA under an Emergency Use  Authorization (EUA).  This EUA will remain in effect (meaning this test can be used) for the duration of the COVID-19 declaration under Section 564(b)(1) of the Act, 21 U.S.C. section 360bbb-3(b)(1), unless the authorization is terminated or revoked sooner.  Performed at [facility], [address]  [place], [place] [address]   Glucose, capillary     Status: Abnormal   Collection Time: 07/30/22 10:16 PM  Result Value Ref Range   Glucose-Capillary 209 (H) 70 - 99 mg/dL    Comment: Glucose reference range applies only to samples taken after fasting for at least 8 hours.  CBC     Status: Abnormal   Collection Time: 07/31/22  5:47 AM  Result Value Ref Range   WBC 11.5 (H) 4.0 - 10.5 K/uL   RBC 5.47 4.22 - 5.81 MIL/uL   Hemoglobin 14.0 13.0 - 17.0 g/dL   HCT 44.3 39.0 - 52.0 %   MCV 81.0 80.0 - 100.0 fL   MCH 25.6 (L) 26.0 - 34.0 pg   MCHC 31.6 30.0 - 36.0 g/dL   RDW 17.2 (H) 11.5 - 15.5 %   Platelets 303 150 - 400 K/uL   nRBC 0.0 0.0 - 0.2 %    Comment: Performed at [facility], [address], [place], [place] [address]  Basic metabolic panel     Status: Abnormal   Collection Time: 07/31/22  5:47 AM  Result Value Ref Range   Sodium 139 135 - 145 mmol/L   Potassium 4.0 3.5 - 5.1 mmol/L   Chloride 106 98 - 111 mmol/L   CO2 26 22 - 32 mmol/L   Glucose, Bld 166 (H) 70 - 99 mg/dL    Comment: Glucose reference range applies only to samples taken after fasting for at least 8 hours.   BUN 15 6 - 20 mg/dL   Creatinine, Ser 1.04 0.61 - 1.24 mg/dL   Calcium 9.1 8.9 - 10.3 mg/dL   GFR, Estimated >60 >60 mL/min    Comment: (NOTE) Calculated using the CKD-EPI Creatinine Equation (2021)    Anion gap 7 5 - 15    Comment: Performed at [facility], [address] [place], [place] [address]  Glucose, capillary     Status: Abnormal   Collection Time: 07/31/22  7:46 AM  Result Value Ref Range   Glucose-Capillary 205 (H) 70 - 99 mg/dL    Comment: Glucose reference range applies only to samples taken after fasting for at least 8 hours.  Glucose, capillary     Status: Abnormal   Collection Time: 07/31/22 11:42 AM  Result Value Ref Range   Glucose-Capillary 159 (H) 70 - 99 mg/dL    Comment: Glucose reference range applies only to samples taken after fasting for at least 8 hours.  ECHOCARDIOGRAM  COMPLETE     Status: None   Collection Time: 07/31/22  1:27 PM  Result Value Ref Range   Weight 6,246.95 oz   Height 71 in   BP 144/81 mmHg   AR max vel 2.77 cm2   AV Area VTI 3.07 cm2   AV Mean grad 9.0 mmHg   AV Peak grad 16.6 mmHg   Ao pk vel 2.04 m/s   AV Area mean vel 2.94 cm2   Area-P 1/2 3.08 cm2   S' Lateral 3.35 cm  Glucose, capillary     Status: Abnormal   Collection Time: 07/31/22  4:51 PM  Result Value Ref Range   Glucose-Capillary 175 (H) 70 - 99 mg/dL  Comment: Glucose reference range applies only to samples taken after fasting for at least 8 hours.  Glucose, capillary     Status: Abnormal   Collection Time: 07/31/22  9:31 PM  Result Value Ref Range   Glucose-Capillary 123 (H) 70 - 99 mg/dL    Comment: Glucose reference range applies only to samples taken after fasting for at least 8 hours.  Glucose, capillary     Status: Abnormal   Collection Time: 07/31/22 10:12 PM  Result Value Ref Range   Glucose-Capillary 131 (H) 70 - 99 mg/dL    Comment: Glucose reference range applies only to samples taken after fasting for at least 8 hours.  Basic metabolic panel     Status: Abnormal   Collection Time: 08/01/22  4:37 AM  Result Value Ref Range   Sodium 140 135 - 145 mmol/L   Potassium 3.4 (L) 3.5 - 5.1 mmol/L   Chloride 108 98 - 111 mmol/L   CO2 25 22 - 32 mmol/L   Glucose, Bld 129 (H) 70 - 99 mg/dL    Comment: Glucose reference range applies only to samples taken after fasting for at least 8 hours.   BUN 17 6 - 20 mg/dL   Creatinine, Ser 3.32 0.61 - 1.24 mg/dL   Calcium 8.8 (L) 8.9 - 10.3 mg/dL   GFR, Estimated >95 >18 mL/min    Comment: (NOTE) Calculated using the CKD-EPI Creatinine Equation (2021)    Anion gap 7 5 - 15    Comment: Performed at [facility], [address], [place], [place] [address]  Magnesium     Status: None   Collection Time: 08/01/22  4:37 AM  Result Value Ref Range   Magnesium 2.2 1.7 - 2.4 mg/dL    Comment: Performed at [facility], [address] [place], [place] [address]  CBC     Status: Abnormal   Collection Time: 08/01/22  4:37 AM  Result Value Ref Range   WBC 12.3 (H) 4.0 - 10.5 K/uL   RBC 5.36 4.22 - 5.81 MIL/uL   Hemoglobin 13.7 13.0 - 17.0 g/dL   HCT 60.1 09.3 - 23.5 %   MCV 81.3 80.0 - 100.0 fL   MCH 25.6 (L) 26.0 - 34.0 pg   MCHC 31.4 30.0 - 36.0 g/dL   RDW 57.3 (H) 22.0 - 25.4 %   Platelets 298 150 - 400 K/uL   nRBC 0.0 0.0 - 0.2 %    Comment: Performed at [facility], [address], [place], [place] [address]  Glucose, capillary     Status: Abnormal   Collection Time: 08/01/22  7:18 AM  Result Value Ref Range   Glucose-Capillary 137 (H) 70 - 99 mg/dL    Comment: Glucose reference range applies only to samples taken after fasting for at least 8 hours.  Glucose, capillary     Status: Abnormal   Collection Time: 08/01/22 11:28 AM  Result Value Ref Range   Glucose-Capillary 121 (H) 70 - 99 mg/dL    Comment: Glucose reference range applies only to samples taken after fasting for at least 8 hours.   Comment 1 Notify RN    Comment 2 Document in Chart

## 2022-09-16 ENCOUNTER — Other Ambulatory Visit: Payer: Self-pay | Admitting: Internal Medicine

## 2022-09-16 DIAGNOSIS — I872 Venous insufficiency (chronic) (peripheral): Secondary | ICD-10-CM

## 2022-10-06 ENCOUNTER — Other Ambulatory Visit: Payer: Self-pay | Admitting: Internal Medicine

## 2022-10-09 ENCOUNTER — Ambulatory Visit: Payer: 59 | Admitting: Family Medicine

## 2022-10-09 ENCOUNTER — Encounter (INDEPENDENT_AMBULATORY_CARE_PROVIDER_SITE_OTHER): Payer: Self-pay

## 2022-10-12 ENCOUNTER — Ambulatory Visit: Payer: 59 | Admitting: Family Medicine

## 2022-10-17 ENCOUNTER — Ambulatory Visit: Payer: 59 | Admitting: Internal Medicine

## 2022-10-19 ENCOUNTER — Encounter: Payer: Self-pay | Admitting: Internal Medicine

## 2022-10-19 ENCOUNTER — Ambulatory Visit (INDEPENDENT_AMBULATORY_CARE_PROVIDER_SITE_OTHER): Payer: 59 | Admitting: Internal Medicine

## 2022-10-19 VITALS — BP 152/75 | HR 85 | Temp 98.0°F | Resp 16 | Ht 71.0 in | Wt 390.0 lb

## 2022-10-19 DIAGNOSIS — I2510 Atherosclerotic heart disease of native coronary artery without angina pectoris: Secondary | ICD-10-CM

## 2022-10-19 DIAGNOSIS — R609 Edema, unspecified: Secondary | ICD-10-CM | POA: Diagnosis not present

## 2022-10-19 DIAGNOSIS — I152 Hypertension secondary to endocrine disorders: Secondary | ICD-10-CM

## 2022-10-19 DIAGNOSIS — E1159 Type 2 diabetes mellitus with other circulatory complications: Secondary | ICD-10-CM

## 2022-10-19 DIAGNOSIS — I2584 Coronary atherosclerosis due to calcified coronary lesion: Secondary | ICD-10-CM

## 2022-10-19 DIAGNOSIS — I5032 Chronic diastolic (congestive) heart failure: Secondary | ICD-10-CM

## 2022-10-19 DIAGNOSIS — E1169 Type 2 diabetes mellitus with other specified complication: Secondary | ICD-10-CM | POA: Diagnosis not present

## 2022-10-19 DIAGNOSIS — N529 Male erectile dysfunction, unspecified: Secondary | ICD-10-CM

## 2022-10-19 DIAGNOSIS — M170 Bilateral primary osteoarthritis of knee: Secondary | ICD-10-CM

## 2022-10-19 DIAGNOSIS — G4733 Obstructive sleep apnea (adult) (pediatric): Secondary | ICD-10-CM

## 2022-10-19 DIAGNOSIS — R972 Elevated prostate specific antigen [PSA]: Secondary | ICD-10-CM

## 2022-10-19 DIAGNOSIS — R600 Localized edema: Secondary | ICD-10-CM

## 2022-10-19 DIAGNOSIS — E786 Lipoprotein deficiency: Secondary | ICD-10-CM

## 2022-10-19 HISTORY — DX: Atherosclerotic heart disease of native coronary artery without angina pectoris: I25.10

## 2022-10-19 HISTORY — DX: Obstructive sleep apnea (adult) (pediatric): G47.33

## 2022-10-19 MED ORDER — TIRZEPATIDE 5 MG/0.5ML ~~LOC~~ SOAJ: 5.0000 mg | SUBCUTANEOUS | 11 refills | Status: DC

## 2022-10-19 MED ORDER — FUROSEMIDE 40 MG PO TABS: 40.0000 mg | ORAL_TABLET | Freq: Every day | ORAL | 3 refills | Status: DC

## 2022-10-19 MED ORDER — OLMESARTAN MEDOXOMIL 40 MG PO TABS: 40.0000 mg | ORAL_TABLET | Freq: Every day | ORAL | 11 refills | Status: DC

## 2022-10-19 NOTE — Assessment & Plan Note (Signed)
Stable, not controlled due to exertion increaseds and hasn't taken meds yets Counseled him to limit salt, limit alcohol, nsaids, weight gain Goal blood pressure of less than 130/80 explained Also, due to comorbid DM, its important to use ACE/ARB if any proteinuria or nephropathy. Repeated encouragement for home blood pressure monitoring Have explained risks of poor control are future stroke and heart attacks Resistant/secondary hypertension workup: not necessary in my opinion Explained Red Flag symptoms for ER: if blood pressure over 180 AND you have new headache, shortness of breath, confusion, or chest discomfort See AFTER VISIT SUMMARY for addition educational information provided Offered to refill meds. Continue:  Current hypertension medications:       Sig   amLODipine (NORVASC) 10 MG tablet Take 1 tablet (10 mg total) by mouth daily.   bisoprolol (ZEBETA) 10 MG tablet Take 2 tablets (20 mg total) by mouth daily.   furosemide (LASIX) 20 MG tablet Take 1 tablet (20 mg total) by mouth daily. Take extra 20mg  for 3 pound weight gain in 24 hours, or swelling or shortness of breath   hydrALAZINE (APRESOLINE) 50 MG tablet Take 1 tablet (50 mg total) by mouth 2 (two) times daily.   olmesartan (BENICAR) 40 MG tablet Take 1 tablet (40 mg total) by mouth daily.   sildenafil (REVATIO) 20 MG tablet 2-5 pills once daily as needed for erectile dysfunction    Patient not taking: Reported on 09/14/2022

## 2022-10-19 NOTE — Patient Instructions (Addendum)
It was a pleasure seeing you today! I truly hope you feel like you received 5 star service and please let me know if there is anything I can improve.  [name], MD   Today the plan is... go up on Mounjaro, monitor BP, follow up 1 month   Hypertension associated with diabetes (HCC) Assessment & Plan: Stable, not controlled due to exertion increaseds and hasn't taken meds yets Counseled him to limit salt, limit alcohol, nsaids, weight gain Goal blood pressure of less than 130/80 explained Also, due to comorbid DM, its important to use ACE/ARB if any proteinuria or nephropathy. Repeated encouragement for home blood pressure monitoring Have explained risks of poor control are future stroke and heart attacks Resistant/secondary hypertension workup: not necessary in my opinion Explained Red Flag symptoms for ER: if blood pressure over 180 AND you have new headache, shortness of breath, confusion, or chest discomfort See AFTER VISIT SUMMARY for addition educational information provided Offered to refill meds. Continue:  Current hypertension medications:       Sig   amLODipine (NORVASC) 10 MG tablet Take 1 tablet (10 mg total) by mouth daily.   bisoprolol (ZEBETA) 10 MG tablet Take 2 tablets (20 mg total) by mouth daily.   furosemide (LASIX) 20 MG tablet Take 1 tablet (20 mg total) by mouth daily. Take extra 20mg  for 3 pound weight gain in 24 hours, or swelling or shortness of breath   hydrALAZINE (APRESOLINE) 50 MG tablet Take 1 tablet (50 mg total) by mouth 2 (two) times daily.   olmesartan (BENICAR) 40 MG tablet Take 1 tablet (40 mg total) by mouth daily.   sildenafil (REVATIO) 20 MG tablet 2-5 pills once daily as needed for erectile dysfunction    Patient not taking: Reported on 09/14/2022         Orders: -     Olmesartan Medoxomil; Take 1 tablet (40 mg total) by mouth daily.  Dispense: 30 tablet; Refill: 11  Peripheral edema -     Furosemide; Take 1 tablet (40 mg total) by  mouth daily. Take extra 40 mg for 3 pound weight gain in 24 hours, or swelling or shortness of breath  Dispense: 90 tablet; Refill: 3  Type 2 diabetes mellitus with other specified complication, without long-term current use of insulin (HCC) -     Tirzepatide; Inject 5 mg into the skin once a week.  Dispense: 6 mL; Refill: 11  OSA (obstructive sleep apnea)  Osteoarthritis of both knees, unspecified osteoarthritis type Assessment & Plan: I explained that continued weight loss will be critical both to reduce the pain in the knees and to enable him to later get a knee replacement if necessary   Erectile dysfunction, unspecified erectile dysfunction type         [x]  RETURN TO CLINIC: Return in about 1 month (around 11/18/2022) for lower the cost of blood pressure medicines consider increasing the weight loss may ?bloodwork.   - If you are not doing well: RETURN to the office sooner. - Please bring all your medicines to each appointment.  - If your condition begins to worsen or become severe:  GO to the ER.  [x]  QUESTIONS/CONCERNS:  If you have follow-up questions / concerns:  - CLINICAL: please contact me via phone [phone] OR MyChart messaging  - LAB & IMAGING RESULTS you will be contacted with the lab results as soon as they are available. For any labs or imaging tests, we will call you if the results  are significantly abnormal.  Most normal results will be posted to MyChart as soon as they are available and I will comment on them there within 2-3 business days.  The fastest way to get your results is to activate your My Chart account. Instructions are located on the last page of this paperwork. If you have not heard from [place] regarding the results in 2 weeks, please contact this office.  - BILLING: xray and lab orders are billed from separate companies and questions./concerns should be directed to the invoicing company.  For visit charges please discuss with our administrative services

## 2022-10-19 NOTE — Assessment & Plan Note (Signed)
He is doing low carb diet Increase mounjaro 2.5->5, don't stop it over cost... but if have to must resume metformin Diabetes is currently very well controlled. * Lab Results  Component Value Date   HGBA1C 5.9 (H) 07/14/2022   HGBA1C 6.3 (H) 03/29/2022   HGBA1C 6.2 (H) 02/10/2022    Stay on high dose statin  Diabetic education: ongoing education regarding chronic disease management for diabetes was given today. We continue to reinforce the ABC's of diabetic management: A1c (<7 or 8 dependent upon patient), tight blood pressure control, and cholesterol management with goal LDL < 100 minimally. We discuss diet strategies, exercise recommendations, medication options and possible side effects. At each visit, we review recommended immunizations and preventive care recommendations for diabetics and stress that good diabetic control can prevent other problems. See below for this patient's data.

## 2022-10-19 NOTE — Assessment & Plan Note (Signed)
I explained that continued weight loss will be critical both to reduce the pain in the knees and to enable him to later get a knee replacement if necessary

## 2022-10-19 NOTE — Progress Notes (Signed)
[place] at [employer]:  [phone]   Routine Medical Office Visit  Patient:  Jason Meyer      Age: 61 y.o.       Sex:  male  Date:   10/19/2022  PCP:    Loralee Pacas, MD    [place] Provider: Loralee Pacas, MD  Assessment/Plan:   Today's 1 month follow-up was initially scheduled to follow-up on his skin condition which is improved with a Lotrisone cream, follow-up on his chronic heart failure which has responded to Lasix but he does want a refill and to stay on it for the fluid and compression stockings, we needed follow-up on his blood pressure and weight loss when she forgot his blood pressure meds this morning and his blood pressure has been high, and his weight loss has gone a little higher where he is filled up with fluid recently but he feels the Darcel Bayley has been working.  Also I had wanted this appointment to try to uncover any other medical problems that I have yet to get into his chart.    After he had departed I spent 10 to 15 minutes reviewing old medical records and updating his chart so there are some problems listed today that were not discussed at the visit   Jason Meyer was seen today for dermatitis, diabetes and congestive heart failure.  Hypertension associated with diabetes ([place]) Overview: D/c acei and lopressor 10/13/2021 due to concerns adverse drug effects  Assessment & Plan: Stable, not controlled due to exertion increaseds and hasn't taken meds yets Counseled him to limit salt, limit alcohol, nsaids, weight gain Goal blood pressure of less than 130/80 explained Also, due to comorbid DM, its important to use ACE/ARB if any proteinuria or nephropathy. Repeated encouragement for home blood pressure monitoring Have explained risks of poor control are future stroke and heart attacks Resistant/secondary hypertension workup: not necessary in my opinion Explained Red Flag symptoms for ER: if blood pressure over 180 AND you have new  headache, shortness of breath, confusion, or chest discomfort See AFTER VISIT SUMMARY for addition educational information provided Offered to refill meds. Continue:  Current hypertension medications:       Sig   amLODipine (NORVASC) 10 MG tablet Take 1 tablet (10 mg total) by mouth daily.   bisoprolol (ZEBETA) 10 MG tablet Take 2 tablets (20 mg total) by mouth daily.   furosemide (LASIX) 20 MG tablet Take 1 tablet (20 mg total) by mouth daily. Take extra 20mg  for 3 pound weight gain in 24 hours, or swelling or shortness of breath   hydrALAZINE (APRESOLINE) 50 MG tablet Take 1 tablet (50 mg total) by mouth 2 (two) times daily.   olmesartan (BENICAR) 40 MG tablet Take 1 tablet (40 mg total) by mouth daily.   sildenafil (REVATIO) 20 MG tablet 2-5 pills once daily as needed for erectile dysfunction    Patient not taking: Reported on 09/14/2022         Orders: -     Olmesartan Medoxomil; Take 1 tablet (40 mg total) by mouth daily.  Dispense: 30 tablet; Refill: 11  Peripheral edema -     Furosemide; Take 1 tablet (40 mg total) by mouth daily. Take extra 40 mg for 3 pound weight gain in 24 hours, or swelling or shortness of breath  Dispense: 90 tablet; Refill: 3  Type 2 diabetes mellitus with other specified complication, without long-term current use of insulin ([place]) Assessment & Plan: He is doing low  carb diet Increase mounjaro 2.5->5, don't stop it over cost... but if have to must resume metformin Diabetes is currently very well controlled. * Lab Results  Component Value Date   HGBA1C 5.9 (H) 07/14/2022   HGBA1C 6.3 (H) 03/29/2022   HGBA1C 6.2 (H) 02/10/2022    Stay on high dose statin  Diabetic education: ongoing education regarding chronic disease management for diabetes was given today. We continue to reinforce the ABC's of diabetic management: A1c (<7 or 8 dependent upon patient), tight blood pressure control, and cholesterol management with goal LDL < 100 minimally. We discuss  diet strategies, exercise recommendations, medication options and possible side effects. At each visit, we review recommended immunizations and preventive care recommendations for diabetics and stress that good diabetic control can prevent other problems. See below for this patient's data.  Orders: -     Tirzepatide; Inject 5 mg into the skin once a week.  Dispense: 6 mL; Refill: 11  OSA (obstructive sleep apnea) Overview: Uses CPAP Following with    Osteoarthritis of both knees, unspecified osteoarthritis type Overview: Was seen by sports medicine and had injections in both knees in September 2023 and it helped some  XR 08/2022  of each knee X-ray shows 9 degree varus deformity grade 4 arthritis noted medial compartment with extensive joint space narrowing and the patient has significant peripheral osteophytes  Impression grade 4 arthritis with moderate varus deformity  Xray left shoulder 2016 Acromioclavicular glenohumeral degenerative change. No acute abnormality.   On: 08/19/2015 12:45  Assessment & Plan: I explained that continued weight loss will be critical both to reduce the pain in the knees and to enable him to later get a knee replacement if necessary   Erectile dysfunction, unspecified erectile dysfunction type  Low HDL (under 40)  Elevated PSA Overview: Lab Results  Component Value Date   PSA1 8.9 (H) 12/22/2021   PSA 2.2 07/06/2014       Coronary artery calcification Overview: Noted on CTA  which also noted Cardiac enlargement, bilateral pleural effusions and diffuse ground-glass attenuation suggestive of mild CHF. 07/2022 referral to cardiology   Chronic diastolic heart failure ([place]) Overview: Admitted with acute hypoxemic respiratory failure in the setting of acute diastolic CHF exacerbation Noted on CTA  which also noted Cardiac enlargement, bilateral pleural effusions and diffuse ground-glass attenuation suggestive of mild CHF. Chronic ble leg  swelling on lasix.  Probably needs spironolactone  Result Date: 07/31/2022    ECHOCARDIOGRAM REPORT    IMPRESSIONS  1. Left ventricular ejection fraction, by estimation, is 60 to 65%. The left ventricle has normal function. The left ventricle has no regional wall motion abnormalities. There is moderate left ventricular hypertrophy. Left ventricular diastolic parameters were normal.  2. Right ventricular systolic function was not well visualized. The right ventricular size is not well visualized. Tricuspid regurgitation signal is inadequate for assessing PA pressure.  3. The mitral valve was not well visualized. No evidence of mitral valve regurgitation. No evidence of mitral stenosis.  4. The aortic valve was not well visualized. Aortic valve regurgitation is not visualized. No aortic stenosis is present.  5. The inferior vena cava is dilated in size with >50% respiratory variability, suggesting right atrial pressure of 8 mmHg. FINDINGS  Left Ventricle: Left ventricular ejection fraction, by estimation, is 60 to 65%. The left ventricle has normal function. The left ventricle has no regional wall motion abnormalities. Definity contrast agent was given IV to delineate the left ventricular  endocardial borders. The left ventricular  internal cavity size was normal in size. There is moderate left ventricular hypertrophy. Left ventricular diastolic parameters were normal. Right Ventricle: The right ventricular size is not well visualized. Right vetricular wall thickness was not well visualized. Right ventricular systolic function was not well visualized. Tricuspid regurgitation signal is inadequate for assessing PA pressure. Left Atrium: Left atrial size was not well visualized. Right Atrium: Right atrial size was not well visualized. Pericardium: There is no evidence of pericardial effusion. Mitral Valve: The mitral valve was not well visualized. No evidence of mitral valve regurgitation. No evidence of mitral valve  stenosis. Tricuspid Valve: The tricuspid valve is not well visualized. Tricuspid valve regurgitation is not demonstrated. No evidence of tricuspid stenosis. Aortic Valve: The aortic valve was not well visualized. Aortic valve regurgitation is not visualized. No aortic stenosis is present. Aortic valve mean gradient measures 9.0 mmHg. Aortic valve peak gradient measures 16.6 mmHg. Aortic valve area, by VTI measures 3.07 cm. Pulmonic Valve: The pulmonic valve was not well visualized. Pulmonic valve regurgitation is not visualized. No evidence of pulmonic stenosis. Aorta: The aortic root is normal in size and structure. Venous: The inferior vena cava is dilated in size with greater than 50% respiratory variability, suggesting right atrial pressure of 8 mmHg. IAS/Shunts: No atrial level shunt detected by color flow Doppler.  LEFT VENTRICLE PLAX 2D LVIDd:         5.20 cm   Diastology LVIDs:         3.35 cm   LV e' medial:    10.20 cm/s LV PW:         1.35 cm   LV E/e' medial:  10.1 LV IVS:        1.40 cm   LV e' lateral:   11.90 cm/s LVOT diam:     2.30 cm   LV E/e' lateral: 8.7 LV SV:         132 LV SV Index:   47 LVOT Area:     4.15 cm  RIGHT VENTRICLE RV S prime:     11.60 cm/s TAPSE (M-mode): 3.0 cm LEFT ATRIUM             Index LA diam:        3.75 cm 1.34 cm/m LA Vol (A2C):   87.6 ml 31.26 ml/m LA Vol (A4C):   84.2 ml 30.05 ml/m LA Biplane Vol: 91.4 ml 32.62 ml/m  AORTIC VALVE AV Area (Vmax):    2.77 cm AV Area (Vmean):   2.94 cm AV Area (VTI):     3.07 cm AV Vmax:           204.00 cm/s AV Vmean:          132.000 cm/s AV VTI:            0.431 m AV Peak Grad:      16.6 mmHg AV Mean Grad:      9.0 mmHg LVOT Vmax:         136.00 cm/s LVOT Vmean:        93.400 cm/s LVOT VTI:          0.318 m LVOT/AV VTI ratio: 0.74  AORTA Ao Root diam: 3.30 cm MITRAL VALVE MV Area (PHT): 3.08 cm     SHUNTS MV Decel Time: 246 msec     Systemic VTI:  0.32 m MV E velocity: 103.00 cm/s  Systemic Diam: 2.30 cm MV A velocity: 93.80  cm/s MV E/A ratio:  1.10 Dina Rich MD Electronically  signed by Dina Rich MD Signature Date/Time: 07/31/2022/1:39:37 PM    Final         Return in about 1 month (around 11/18/2022) for lower the cost of blood pressure medicines consider increasing the weight loss may ?bloodwork.    Today's key discussion points - also in After Visit Summary (AVS) Common side effects, risks, benefits, and alternatives for medications and treatment plan prescribed today were discussed, and he expressed understanding of the given instructions.  Medication list was reconciled and patient instructions and summary information was documented and made available for him to review in the AVS (see AVS).  This note is also available to patient for review for accuracy and understanding. He was encouraged to contact our office by phone or message via MyChart if he has any questions or concerns regarding our treatment plan (see AVS).  We discussed red flag symptoms and signs in detail and when to call the office or go to ER if his condition worsens (see AFTER VISIT SUMMARY). He expressed understanding.  No barriers to understanding were identified     Subjective:   Tymere Depuy Colvard is a 61 y.o. male with PMH significant for: Past Medical History:  Diagnosis Date   Allergy    Arthritis    Chronic diastolic heart failure (HCC) 08/11/2022   Admitted with acute hypoxemic respiratory failure in the setting of acute diastolic CHF exacerbation      Coronary artery calcification 10/19/2022   Noted on CTA  which also noted Cardiac enlargement, bilateral pleural effusions and diffuse ground-glass attenuation suggestive of mild CHF. 07/2022 referral to cardiology   Hyperlipidemia    Hypertension    OSA (obstructive sleep apnea) 10/19/2022   Sleep apnea    Type 2 diabetes mellitus (HCC)      He is presenting today with: Chief Complaint  Patient presents with   Dermatitis   Diabetes   Congestive Heart Failure      Additional physician-collected and physician-reviewed history: See Assessment/Plan section for today's problem updates to charted history (under Overview: and Assessment & Plan for each problem)  Hasn't taken BP meds and BP high, settled to 150s after resting and a few repeates Reports he is taking Wellbutrin in the [facility] no significant negative side effects and is agreeable to go up on the dose of Mounjaro although he does express concern about having to take 15 medications a day No weight loss yet  but is trying diet Saw sports med about the knees, got injection and meloxicam with limited benefit. Needs to lose weight in order to get knee replacement. Reports the meds are hard to afford now that deductible reset        Objective:  Physical Exam: BP (!) 152/75   Pulse 85   Temp 98 F (36.7 C)   Resp 16   Ht 5\' 11"  (1.803 m)   Wt (!) 390 lb (176.9 kg)   SpO2 94%   BMI 54.39 kg/m   He is a polite, friendly, and genuine person Constitutional: NAD, AAO, not ill-appearing Neuro: alert, no focal deficit obvious, articulate speech Psych: normal mood, behavior, thought content Problem-specific physical exam findings:  Uncomfortable walking, painful in knees  Results Reviewed:  No results found for any visits on 10/19/22.   Recent Results (from the past 2160 hour(s))  Basic metabolic panel     Status: Abnormal   Collection Time: 07/30/22 11:17 AM  Result Value Ref Range   Sodium 140 135 - 145 mmol/L  Potassium 4.0 3.5 - 5.1 mmol/L   Chloride 108 98 - 111 mmol/L   CO2 25 22 - 32 mmol/L   Glucose, Bld 117 (H) 70 - 99 mg/dL    Comment: Glucose reference range applies only to samples taken after fasting for at least 8 hours.   BUN 12 6 - 20 mg/dL   Creatinine, Ser 1.00 0.61 - 1.24 mg/dL   Calcium 8.9 8.9 - 10.3 mg/dL   GFR, Estimated >60 >60 mL/min    Comment: (NOTE) Calculated using the CKD-EPI Creatinine Equation (2021)    Anion gap 7 5 - 15    Comment: Performed at  [facility], [address], [place], [place] [address]  CBC with Differential     Status: Abnormal   Collection Time: 07/30/22 11:17 AM  Result Value Ref Range   WBC 12.5 (H) 4.0 - 10.5 K/uL   RBC 5.62 4.22 - 5.81 MIL/uL   Hemoglobin 14.4 13.0 - 17.0 g/dL   HCT 45.3 39.0 - 52.0 %   MCV 80.6 80.0 - 100.0 fL   MCH 25.6 (L) 26.0 - 34.0 pg   MCHC 31.8 30.0 - 36.0 g/dL   RDW 17.2 (H) 11.5 - 15.5 %   Platelets 269 150 - 400 K/uL   nRBC 0.0 0.0 - 0.2 %   Neutrophils Relative % 85 %   Neutro Abs 10.5 (H) 1.7 - 7.7 K/uL   Lymphocytes Relative 8 %   Lymphs Abs 1.1 0.7 - 4.0 K/uL   Monocytes Relative 5 %   Monocytes Absolute 0.6 0.1 - 1.0 K/uL   Eosinophils Relative 2 %   Eosinophils Absolute 0.3 0.0 - 0.5 K/uL   Basophils Relative 0 %   Basophils Absolute 0.1 0.0 - 0.1 K/uL   Immature Granulocytes 0 %   Abs Immature Granulocytes 0.05 0.00 - 0.07 K/uL    Comment: Performed at [facility], [address] [place], [place] [address]  Troponin I (High Sensitivity)     Status: None   Collection Time: 07/30/22 11:17 AM  Result Value Ref Range   Troponin I (High Sensitivity) 7 <18 ng/L    Comment: (NOTE) Elevated high sensitivity troponin I (hsTnI) values and significant  changes across serial measurements may suggest ACS but many other  chronic and acute conditions are known to elevate hsTnI results.  Refer to the "Links" section for chest pain algorithms and additional  guidance. Performed at [facility], [address] [place], [place] [address]   Brain natriuretic peptide     Status: None   Collection Time: 07/30/22 11:17 AM  Result Value Ref Range   B Natriuretic Peptide 61.0 0.0 - 100.0 pg/mL    Comment: Performed at [facility], [address], [place], [place] [address]  D-dimer, quantitative     Status: Abnormal   Collection Time: 07/30/22 11:17 AM  Result Value Ref Range   D-Dimer, Quant 0.66 (H) 0.00 - 0.50 ug/mL-FEU    Comment: (NOTE) At the manufacturer cut-off value of  0.5 g/mL FEU, this assay has a negative predictive value of 95-100%.This assay is intended for use in conjunction with a clinical pretest probability (PTP) assessment model to exclude pulmonary embolism (PE) and deep venous thrombosis (DVT) in outpatients suspected of PE or DVT. Results should be correlated with clinical presentation. Performed at [facility], [address] [place], [place] [address]   SARS Coronavirus 2 by RT PCR (hospital order, performed in [facility] hospital lab) *cepheid single result test* Anterior  Nasal Swab     Status: None   Collection Time: 07/30/22 11:28 AM   Specimen: Anterior Nasal Swab  Result Value Ref Range   SARS Coronavirus 2 by RT PCR NEGATIVE NEGATIVE    Comment: (NOTE) SARS-CoV-2 target nucleic acids are NOT DETECTED.  The SARS-CoV-2 RNA is generally detectable in upper and lower respiratory specimens during the acute phase of infection. The lowest concentration of SARS-CoV-2 viral copies this assay can detect is 250 copies / mL. A negative result does not preclude SARS-CoV-2 infection and should not be used as the sole basis for treatment or other patient management decisions.  A negative result may occur with improper specimen collection / handling, submission of specimen other than nasopharyngeal swab, presence of viral mutation(s) within the areas targeted by this assay, and inadequate number of viral copies (<250 copies / mL). A negative result must be combined with clinical observations, patient history, and epidemiological information.  Fact Sheet for Patients:   https://www.patel.info/  Fact Sheet for Healthcare Providers: https://hall.com/  This test is not yet approved or  cleared by the [place] FDA and has been authorized for detection and/or diagnosis of SARS-CoV-2 by FDA under an Emergency Use Authorization (EUA).  This EUA will remain in effect (meaning this test can be used)  for the duration of the COVID-19 declaration under Section 564(b)(1) of the Act, 21 U.S.C. section 360bbb-3(b)(1), unless the authorization is terminated or revoked sooner.  Performed at [facility], [address] [place], [place] [address]   Glucose, capillary     Status: Abnormal   Collection Time: 07/30/22 10:16 PM  Result Value Ref Range   Glucose-Capillary 209 (H) 70 - 99 mg/dL    Comment: Glucose reference range applies only to samples taken after fasting for at least 8 hours.  CBC     Status: Abnormal   Collection Time: 07/31/22  5:47 AM  Result Value Ref Range   WBC 11.5 (H) 4.0 - 10.5 K/uL   RBC 5.47 4.22 - 5.81 MIL/uL   Hemoglobin 14.0 13.0 - 17.0 g/dL   HCT 44.3 39.0 - 52.0 %   MCV 81.0 80.0 - 100.0 fL   MCH 25.6 (L) 26.0 - 34.0 pg   MCHC 31.6 30.0 - 36.0 g/dL   RDW 17.2 (H) 11.5 - 15.5 %   Platelets 303 150 - 400 K/uL   nRBC 0.0 0.0 - 0.2 %    Comment: Performed at [facility], [address], [place], [place] [id number]  Basic metabolic panel     Status: Abnormal   Collection Time: 07/31/22  5:47 AM  Result Value Ref Range   Sodium 139 135 - 145 mmol/L   Potassium 4.0 3.5 - 5.1 mmol/L   Chloride 106 98 - 111 mmol/L   CO2 26 22 - 32 mmol/L   Glucose, Bld 166 (H) 70 - 99 mg/dL    Comment: Glucose reference range applies only to samples taken after fasting for at least 8 hours.   BUN 15 6 - 20 mg/dL   Creatinine, Ser 1.04 0.61 - 1.24 mg/dL   Calcium 9.1 8.9 - 10.3 mg/dL   GFR, Estimated >60 >60 mL/min    Comment: (NOTE) Calculated using the CKD-EPI Creatinine Equation (2021)    Anion gap 7 5 - 15    Comment: Performed at [facility], [address] [place], [place] [address]  Glucose, capillary     Status: Abnormal   Collection Time: 07/31/22  7:46 AM  Result  Value Ref Range   Glucose-Capillary 205 (H) 70 - 99 mg/dL    Comment: Glucose reference range applies only to samples taken after fasting for at least 8 hours.  Glucose, capillary     Status: Abnormal    Collection Time: 07/31/22 11:42 AM  Result Value Ref Range   Glucose-Capillary 159 (H) 70 - 99 mg/dL    Comment: Glucose reference range applies only to samples taken after fasting for at least 8 hours.  ECHOCARDIOGRAM COMPLETE     Status: None   Collection Time: 07/31/22  1:27 PM  Result Value Ref Range   Weight 6,246.95 oz   Height 71 in   BP 144/81 mmHg   AR max vel 2.77 cm2   AV Area VTI 3.07 cm2   AV Mean grad 9.0 mmHg   AV Peak grad 16.6 mmHg   Ao pk vel 2.04 m/s   AV Area mean vel 2.94 cm2   Area-P 1/2 3.08 cm2   S' Lateral 3.35 cm  Glucose, capillary     Status: Abnormal   Collection Time: 07/31/22  4:51 PM  Result Value Ref Range   Glucose-Capillary 175 (H) 70 - 99 mg/dL    Comment: Glucose reference range applies only to samples taken after fasting for at least 8 hours.  Glucose, capillary     Status: Abnormal   Collection Time: 07/31/22  9:31 PM  Result Value Ref Range   Glucose-Capillary 123 (H) 70 - 99 mg/dL    Comment: Glucose reference range applies only to samples taken after fasting for at least 8 hours.  Glucose, capillary     Status: Abnormal   Collection Time: 07/31/22 10:12 PM  Result Value Ref Range   Glucose-Capillary 131 (H) 70 - 99 mg/dL    Comment: Glucose reference range applies only to samples taken after fasting for at least 8 hours.  Basic metabolic panel     Status: Abnormal   Collection Time: 08/01/22  4:37 AM  Result Value Ref Range   Sodium 140 135 - 145 mmol/L   Potassium 3.4 (L) 3.5 - 5.1 mmol/L   Chloride 108 98 - 111 mmol/L   CO2 25 22 - 32 mmol/L   Glucose, Bld 129 (H) 70 - 99 mg/dL    Comment: Glucose reference range applies only to samples taken after fasting for at least 8 hours.   BUN 17 6 - 20 mg/dL   Creatinine, Ser 1.20 0.61 - 1.24 mg/dL   Calcium 8.8 (L) 8.9 - 10.3 mg/dL   GFR, Estimated >60 >60 mL/min    Comment: (NOTE) Calculated using the CKD-EPI Creatinine Equation (2021)    Anion gap 7 5 - 15    Comment:  Performed at [facility], [address], [place], [place] [address]  Magnesium     Status: None   Collection Time: 08/01/22  4:37 AM  Result Value Ref Range   Magnesium 2.2 1.7 - 2.4 mg/dL    Comment: Performed at [facility], [address], [place], [place] [address]  CBC     Status: Abnormal   Collection Time: 08/01/22  4:37 AM  Result Value Ref Range   WBC 12.3 (H) 4.0 - 10.5 K/uL   RBC 5.36 4.22 - 5.81 MIL/uL   Hemoglobin 13.7 13.0 - 17.0 g/dL   HCT 43.6 39.0 - 52.0 %   MCV 81.3 80.0 - 100.0 fL   MCH 25.6 (L) 26.0 - 34.0 pg   MCHC 31.4 30.0 - 36.0 g/dL  RDW 17.1 (H) 11.5 - 15.5 %   Platelets 298 150 - 400 K/uL   nRBC 0.0 0.0 - 0.2 %    Comment: Performed at [facility], [address], [place], [place] [address]  Glucose, capillary     Status: Abnormal   Collection Time: 08/01/22  7:18 AM  Result Value Ref Range   Glucose-Capillary 137 (H) 70 - 99 mg/dL    Comment: Glucose reference range applies only to samples taken after fasting for at least 8 hours.  Glucose, capillary     Status: Abnormal   Collection Time: 08/01/22 11:28 AM  Result Value Ref Range   Glucose-Capillary 121 (H) 70 - 99 mg/dL    Comment: Glucose reference range applies only to samples taken after fasting for at least 8 hours.   Comment 1 Notify RN    Comment 2 Document in Chart

## 2022-11-20 ENCOUNTER — Ambulatory Visit: Payer: 59 | Admitting: Internal Medicine

## 2022-12-09 ENCOUNTER — Other Ambulatory Visit: Payer: Self-pay | Admitting: Family Medicine

## 2022-12-09 DIAGNOSIS — R0989 Other specified symptoms and signs involving the circulatory and respiratory systems: Secondary | ICD-10-CM

## 2022-12-26 ENCOUNTER — Encounter: Payer: Self-pay | Admitting: Internal Medicine

## 2022-12-26 ENCOUNTER — Ambulatory Visit (INDEPENDENT_AMBULATORY_CARE_PROVIDER_SITE_OTHER): Payer: 59 | Admitting: Internal Medicine

## 2022-12-26 VITALS — BP 114/68 | HR 83 | Temp 98.1°F | Ht 71.0 in | Wt 376.2 lb

## 2022-12-26 DIAGNOSIS — Z1211 Encounter for screening for malignant neoplasm of colon: Secondary | ICD-10-CM

## 2022-12-26 DIAGNOSIS — I2584 Coronary atherosclerosis due to calcified coronary lesion: Secondary | ICD-10-CM

## 2022-12-26 DIAGNOSIS — I2510 Atherosclerotic heart disease of native coronary artery without angina pectoris: Secondary | ICD-10-CM | POA: Insufficient documentation

## 2022-12-26 DIAGNOSIS — E1169 Type 2 diabetes mellitus with other specified complication: Secondary | ICD-10-CM

## 2022-12-26 DIAGNOSIS — R600 Localized edema: Secondary | ICD-10-CM

## 2022-12-26 DIAGNOSIS — R399 Unspecified symptoms and signs involving the genitourinary system: Secondary | ICD-10-CM

## 2022-12-26 DIAGNOSIS — N3000 Acute cystitis without hematuria: Secondary | ICD-10-CM

## 2022-12-26 DIAGNOSIS — R609 Edema, unspecified: Secondary | ICD-10-CM

## 2022-12-26 DIAGNOSIS — R0602 Shortness of breath: Secondary | ICD-10-CM

## 2022-12-26 DIAGNOSIS — Z0000 Encounter for general adult medical examination without abnormal findings: Secondary | ICD-10-CM | POA: Diagnosis not present

## 2022-12-26 DIAGNOSIS — Z5320 Procedure and treatment not carried out because of patient's decision for unspecified reasons: Secondary | ICD-10-CM

## 2022-12-26 DIAGNOSIS — E662 Morbid (severe) obesity with alveolar hypoventilation: Secondary | ICD-10-CM

## 2022-12-26 LAB — POCT URINALYSIS DIPSTICK
Bilirubin, UA: NEGATIVE
Blood, UA: NEGATIVE
Glucose, UA: NEGATIVE
Ketones, UA: NEGATIVE
Protein, UA: NEGATIVE
Spec Grav, UA: 1.015 (ref 1.010–1.025)
Urobilinogen, UA: 0.2 E.U./dL
pH, UA: 5 (ref 5.0–8.0)

## 2022-12-26 LAB — COMPREHENSIVE METABOLIC PANEL
ALT: 12 U/L (ref 0–53)
AST: 13 U/L (ref 0–37)
Albumin: 4 g/dL (ref 3.5–5.2)
Alkaline Phosphatase: 95 U/L (ref 39–117)
BUN: 11 mg/dL (ref 6–23)
CO2: 29 mEq/L (ref 19–32)
Calcium: 9.5 mg/dL (ref 8.4–10.5)
Chloride: 103 mEq/L (ref 96–112)
Creatinine, Ser: 1.17 mg/dL (ref 0.40–1.50)
GFR: 67.96 mL/min (ref >60.00)
Glucose, Bld: 103 mg/dL — ABNORMAL HIGH (ref 70–99)
Potassium: 4.3 mEq/L (ref 3.5–5.1)
Sodium: 141 mEq/L (ref 135–145)
Total Bilirubin: 0.5 mg/dL (ref 0.2–1.2)
Total Protein: 7.2 g/dL (ref 6.0–8.3)

## 2022-12-26 LAB — MICROALBUMIN / CREATININE URINE RATIO
Creatinine,U: 61.7 mg/dL
Microalb Creat Ratio: 2.7 mg/g (ref 0.0–30.0)
Microalb, Ur: 1.7 mg/dL (ref 0.0–1.9)

## 2022-12-26 LAB — LIPID PANEL
Cholesterol: 139 mg/dL (ref 0–200)
HDL: 30.5 mg/dL — ABNORMAL LOW (ref >39.00)
LDL Cholesterol: 94 mg/dL (ref 0–99)
NonHDL: 108.76
Total CHOL/HDL Ratio: 5
Triglycerides: 74 mg/dL (ref 0.0–149.0)
VLDL: 14.8 mg/dL (ref 0.0–40.0)

## 2022-12-26 LAB — CBC
HCT: 44.1 % (ref 39.0–52.0)
Hemoglobin: 14.3 g/dL (ref 13.0–17.0)
MCHC: 32.5 g/dL (ref 30.0–36.0)
MCV: 77.2 fl — ABNORMAL LOW (ref 78.0–100.0)
Platelets: 331 10*3/uL (ref 150.0–400.0)
RBC: 5.71 Mil/uL (ref 4.22–5.81)
RDW: 17.2 % — ABNORMAL HIGH (ref 11.5–15.5)
WBC: 10.1 10*3/uL (ref 4.0–10.5)

## 2022-12-26 MED ORDER — ALBUTEROL SULFATE (2.5 MG/3ML) 0.083% IN NEBU: 2.5000 mg | INHALATION_SOLUTION | Freq: Four times a day (QID) | RESPIRATORY_TRACT | 10 refills | Status: DC | PRN

## 2022-12-26 MED ORDER — POTASSIUM CHLORIDE ER 10 MEQ PO CPCR: 10.0000 meq | ORAL_CAPSULE | Freq: Every day | ORAL | 3 refills | Status: DC

## 2022-12-26 MED ORDER — ALBUTEROL SULFATE HFA 108 (90 BASE) MCG/ACT IN AERS: 2.0000 | INHALATION_SPRAY | Freq: Four times a day (QID) | RESPIRATORY_TRACT | 10 refills | Status: DC | PRN

## 2022-12-26 NOTE — Progress Notes (Signed)
[facility] [employer]: [phone] Provider: Loralee Pacas, MD   Chief Complaint:  Jason Meyer is a 62 y.o. assigned male at birth who presents today for his annual comprehensive physical exam.    Medical assistant also reports: Chief Complaint  Patient presents with   Annual Exam    Ordered labs, had some popcorn with medicine around 7:30, 7:45 am.    Assessment/Plan:   Armon was seen today for annual exam.  Colon cancer screening -     Cologuard  Preventative health care -     CBC -     Comprehensive metabolic panel -     Lipid panel  Coronary artery disease due to calcified coronary lesion Overview: Following with cardiology in [place]  CT angio 07/2022 . Cardiac enlargement, bilateral pleural effusions and diffuse ground-glass attenuation suggestive of mild CHF. 3. Coronary artery calcifications.   Type 2 diabetes mellitus with other specified complication, without long-term current use of insulin (HCC) -     Microalbumin / creatinine urine ratio  Shortness of breath -     Albuterol Sulfate HFA; Inhale 2 puffs into the lungs every 6 (six) hours as needed for wheezing or shortness of breath.  Dispense: 18 g; Refill: 10 -     Albuterol Sulfate; Take 3 mLs (2.5 mg total) by nebulization every 6 (six) hours as needed for wheezing or shortness of breath.  Dispense: 75 mL; Refill: 10  Obesity hypoventilation syndrome (HCC) -     Albuterol Sulfate HFA; Inhale 2 puffs into the lungs every 6 (six) hours as needed for wheezing or shortness of breath.  Dispense: 18 g; Refill: 10 -     Albuterol Sulfate; Take 3 mLs (2.5 mg total) by nebulization every 6 (six) hours as needed for wheezing or shortness of breath.  Dispense: 75 mL; Refill: 10  Peripheral edema -     Potassium Chloride ER; Take 1 capsule (10 mEq total) by mouth daily.  Dispense: 30 capsule; Refill: 3  UTI symptoms -     Urine Culture -     POCT urinalysis dipstick  Colon cancer  screening declined      Today's Health Maintenance Counseling and Anticipatory Guidance:   Eye exams:  every 1-2 years recommended.  Having vision corrected can improve the quality of day-to-day life.  Eye specialists can detect certain eye conditions such as cataracts, glaucoma and age-related macular degeneration, which could lead to sight loss.  They can also detect certain rare cancers and diabetes, among other things.  Jason Meyer reports his last eye exam was:  supposed to get yearly due to diabetes- he will go to [facility] place and will have them send to me. Dental health: Discussed importance of regular tooth brushing, flossing, and dental visits q6 months.  Poor dentition can lead to serious medical problems - particularly problems with heart valves.  Jason Meyer reports he has not been keeping up with his dental visits.  He intends to do so in the future. He has never been. Sinus health: Encourage sterile saline nasal misting sinus rinses daily for pollen, to reduce allergies and risk for sinus infections.   Sterile can based misting products are recommended due to superior misting and ease of maintaining sterility.  Sleep Apnea screening:  he is wearing CPAP and likes it. Cardiovascular Risk Factor Reduction:   Advised patient of need for regular exercise and diet rich and fruits and vegetables and healthy fats to reduce risk of heart  attack and stroke.  Avoid first- and second-hand smoke and stimulants.   Avoid extreme exercise- exercise in moderation (150 minutes per week is a good goal) Wt Readings from Last 3 Encounters:  12/26/22 (!) 376 lb 3.2 oz (170.6 kg)  10/19/22 (!) 390 lb (176.9 kg)  09/14/22 (!) 389 lb 6.4 oz (176.6 kg)  We discussed coronary CT and he did one 6 months  Body mass index is 52.47 kg/m. / encourage weight loss. He is taking Mounjaro and on  He reports his diet consists of bake and broil He reports his exercise includes of pedaling. Encouraged resistance  bands Health maintenance and immunizations reviewed and he was encouraged to complete anything that is due: Immunization History  Administered Date(s) Administered   [employer] Vaccination 03/11/2020, 04/15/2020   Health Maintenance Due  Topic Date Due   COLONOSCOPY (Pts 45-68yrs Insurance coverage will need to be confirmed)  Never done   Diabetic kidney evaluation - Urine ACR  08/08/2019   FOOT EXAM  09/15/2021   OPHTHALMOLOGY EXAM  09/24/2021    STD screening: testing offered today, but patient declined as He considers themself to be low risk based on his sexual history    Substance use:  I discussed that my recommendation is total abstinence from all substances of abuse including smoke and 2nd hand smoke, alcohol, illicit drugs, smoking, inhalants, sugar.   Offered to assist with any use disorders or addictions.   Injury prevention: Discussed safety belts, safety helmets, smoke detectors. Sleep Apnea Screening Penile cancer screening: Asked about genital warts or tumors/abnormalities of penis. Recommended Gardasil if none present, or cryo ablation if present Thyroid cancer screening: patient advised to check by palpating thyroid for nodules Prostate cancer screening:  Denies family history of prostate cancer or hematospermia so too young for screening by current guidelines. Lab Results  Component Value Date   PSA 2.2 07/06/2014      I offered to testing but he declined after shared decision making  Colon cancer screening:    Denies strong family history of colon cancer or blood in stool so no screening is indicated until age 58.  Never had any colon cancer screening.  Declined Cologuard but I encouraged  Skin cancer screening-  Advised regular sunscreen use. He denies worrisome, changing, or new skin lesions. Showed him pictures of melanomas for reference:   .  Return to care in 1 year for next preventative visit.   [name], MD     Subjective:   See  problem-oriented charting in (overview sections of assessment & plan) for updated chart information added to chronic problems for future tracking  He has no acute complaints today.   Review of Systems  Constitutional:  Negative for chills, diaphoresis, fever, malaise/fatigue and weight loss.  HENT:  Negative for congestion, ear discharge, ear pain, hearing loss, nosebleeds, sinus pain, sore throat and tinnitus.   Eyes:  Negative for blurred vision, double vision, photophobia, pain, discharge and redness.  Respiratory:  Negative for cough, hemoptysis, sputum production, shortness of breath, wheezing and stridor.   Cardiovascular:  Positive for leg swelling. Negative for chest pain, palpitations, orthopnea, claudication and PND.  Gastrointestinal:  Negative for abdominal pain, blood in stool, constipation, diarrhea, heartburn, melena, nausea and vomiting.  Genitourinary:  Negative for dysuria, flank pain, frequency, hematuria and urgency.  Musculoskeletal:  Negative for back pain, falls, joint pain, myalgias and neck pain.  Skin:  Negative for itching and rash.  Neurological:  Negative for dizziness,  tingling, tremors, sensory change, speech change, focal weakness, seizures, loss of consciousness, weakness and headaches.  Endo/Heme/Allergies:  Negative for environmental allergies and polydipsia. Does not bruise/bleed easily.  Psychiatric/Behavioral:  Negative for depression, hallucinations, memory loss, substance abuse and suicidal ideas. The patient is not nervous/anxious and does not have insomnia.      I attest that I have reviewed and confirmed the patients current medications to meet the medication reconciliation requirement  Current Outpatient Medications  Medication Sig Dispense Refill   amLODipine (NORVASC) 10 MG tablet Take 1 tablet (10 mg total) by mouth daily. 90 tablet 3   atorvastatin (LIPITOR) 80 MG tablet Take 1 tablet (80 mg total) by mouth daily. 90 tablet 3   azelastine  (ASTELIN) 0.1 % nasal spray Place 1 spray into both nostrils 2 (two) times daily. Use in each nostril as directed 30 mL 12   bisoprolol (ZEBETA) 10 MG tablet Take 2 tablets (20 mg total) by mouth daily. 180 tablet 3   clotrimazole-betamethasone (LOTRISONE) cream APPLY TO AFFECTED AREA TWICE A DAY 30 g 0   fluticasone (FLONASE) 50 MCG/ACT nasal spray Place 2 sprays into both nostrils daily. 16 g 6   fluticasone furoate-vilanterol (BREO ELLIPTA) 100-25 MCG/ACT AEPB Inhale 1 puff into the lungs daily. 1 each 11   furosemide (LASIX) 40 MG tablet Take 1 tablet (40 mg total) by mouth daily. Take extra 40 mg for 3 pound weight gain in 24 hours, or swelling or shortness of breath 90 tablet 3   guaiFENesin (MUCINEX) 600 MG 12 hr tablet Take 1 tablet (600 mg total) by mouth 2 (two) times daily. 60 tablet 2   hydrALAZINE (APRESOLINE) 50 MG tablet Take 1 tablet (50 mg total) by mouth 2 (two) times daily. 180 tablet 1   meloxicam (MOBIC) 7.5 MG tablet Take 7.5 mg by mouth daily.     olmesartan (BENICAR) 40 MG tablet Take 1 tablet (40 mg total) by mouth daily. 30 tablet 11   sildenafil (REVATIO) 20 MG tablet 2-5 pills once daily as needed for erectile dysfunction 50 tablet 3   tirzepatide (MOUNJARO) 5 MG/0.5ML Pen Inject 5 mg into the skin once a week. 6 mL 11   albuterol (PROVENTIL) (2.5 MG/3ML) 0.083% nebulizer solution Take 3 mLs (2.5 mg total) by nebulization every 6 (six) hours as needed for wheezing or shortness of breath. 75 mL 10   albuterol (VENTOLIN HFA) 108 (90 Base) MCG/ACT inhaler Inhale 2 puffs into the lungs every 6 (six) hours as needed for wheezing or shortness of breath. 18 g 10   potassium chloride (MICRO-K) 10 MEQ CR capsule Take 1 capsule (10 mEq total) by mouth daily. 30 capsule 3   No current facility-administered medications for this visit.    The following were reviewed and entered/updated in epic:    08/11/2022    2:17 PM  Depression screen PHQ 2/9  Decreased Interest 0  Down,  Depressed, Hopeless 0  PHQ - 2 Score 0   Past Medical History:  Diagnosis Date   Allergy    Arthritis    Chronic diastolic heart failure ([place]) 08/11/2022   Admitted with acute hypoxemic respiratory failure in the setting of acute diastolic CHF exacerbation      Coronary artery calcification 10/19/2022   Noted on CTA  which also noted Cardiac enlargement, bilateral pleural effusions and diffuse ground-glass attenuation suggestive of mild CHF. 07/2022 referral to cardiology   Hyperlipidemia    Hypertension    OSA (obstructive sleep apnea) 10/19/2022  Sleep apnea    Type 2 diabetes mellitus (HCC)    Patient Active Problem List   Diagnosis Date Noted   Coronary artery disease 12/26/2022   Colon cancer screening 12/26/2022   OSA (obstructive sleep apnea) 10/19/2022   Erectile dysfunction 10/19/2022   Low HDL (under 40) 10/19/2022   Elevated PSA 10/19/2022   Coronary artery calcification 10/19/2022   Chronic diastolic heart failure (HCC) 08/11/2022   Venous stasis dermatitis of both lower extremities 08/11/2022   DOE (dyspnea on exertion) 06/22/2021   OA (osteoarthritis) 11/23/2015   Venous stasis 09/20/2015   Type 2 diabetes mellitus with other specified complication (HCC) 04/23/2013   Hypertension associated with diabetes (HCC) 04/23/2013   Morbid obesity (HCC) 04/23/2013   Hyperlipidemia associated with type 2 diabetes mellitus (HCC) 04/23/2013   Past Surgical History:  Procedure Laterality Date   NO PAST SURGERIES     Family History  Problem Relation Age of Onset   Alzheimer's disease Mother    Hypertension Father    Heart disease Father    Heart attack Father    Stroke Father    Heart attack Brother    Allergies  Allergen Reactions   Penicillins Swelling   Social History   Tobacco Use   Smoking status: Former    Packs/day: 1.00    Years: 33.00    Total pack years: 33.00    Types: Cigarettes    Quit date: 12/12/2007    Years since quitting: 15.0    Smokeless tobacco: Never  Vaping Use   Vaping Use: Never used  Substance Use Topics   Alcohol use: Not Currently   Drug use: No           Objective:  Physical Exam: BP 114/68 (BP Location: Right Arm, Patient Position: Sitting)   Pulse 83   Temp 98.1 F (36.7 C) (Temporal)   Ht 5\' 11"  (1.803 m)   Wt (!) 376 lb 3.2 oz (170.6 kg)   SpO2 94%   BMI 52.47 kg/m   Body mass index is 52.47 kg/m. Qualifies as morbid obesity based on BMI. Wt Readings from Last 3 Encounters:  12/26/22 (!) 376 lb 3.2 oz (170.6 kg)  10/19/22 (!) 390 lb (176.9 kg)  09/14/22 (!) 389 lb 6.4 oz (176.6 kg)   Gen: NAD, resting comfortably HEENT: TMs normal bilaterally. OP clear. No thyromegaly noted.  CV: RRR with no murmurs appreciated Pulm: NWOB, CTAB with no crackles, wheezes, or rhonchi GI: Normal bowel sounds present. Soft, Nontender, Nondistended. MSK: no edema, cyanosis, or clubbing noted Skin: warm, dry Neuro: CN2-12 grossly intact. Strength 5/5 in upper and lower extremities. Reflexes symmetric and intact bilaterally.  Psych: Normal affect and thought content

## 2022-12-27 ENCOUNTER — Encounter: Payer: Self-pay | Admitting: Internal Medicine

## 2022-12-27 DIAGNOSIS — R718 Other abnormality of red blood cells: Secondary | ICD-10-CM | POA: Insufficient documentation

## 2022-12-27 DIAGNOSIS — D563 Thalassemia minor: Secondary | ICD-10-CM | POA: Insufficient documentation

## 2022-12-27 DIAGNOSIS — D509 Iron deficiency anemia, unspecified: Secondary | ICD-10-CM | POA: Insufficient documentation

## 2022-12-27 NOTE — Progress Notes (Signed)
  I have reviewed your recent results and, in my medical opinion:  There is nothing new or different here.  Red blood cell(s) are small but they have always been that way, recommmend we check iron next visit but probably this is just a genetic thing.  With the HDL there isn't much you can due to improve it... diet and exercise helps only a little. Loralee Pacas, MD  12/27/2022 8:03 AM

## 2022-12-28 LAB — URINE CULTURE
MICRO NUMBER:: 14435037
SPECIMEN QUALITY:: ADEQUATE

## 2022-12-29 MED ORDER — CEPHALEXIN 500 MG PO CAPS: 500.0000 mg | ORAL_CAPSULE | Freq: Three times a day (TID) | ORAL | 0 refills | Status: DC

## 2022-12-29 NOTE — Addendum Note (Signed)
Addended by: Loralee Pacas on: 12/29/2022 06:00 PM   Modules accepted: Orders

## 2023-01-05 ENCOUNTER — Other Ambulatory Visit: Payer: Self-pay | Admitting: Family Medicine

## 2023-01-05 ENCOUNTER — Other Ambulatory Visit: Payer: Self-pay | Admitting: Internal Medicine

## 2023-01-05 DIAGNOSIS — E1159 Type 2 diabetes mellitus with other circulatory complications: Secondary | ICD-10-CM

## 2023-01-11 NOTE — Telephone Encounter (Signed)
Dr.Morrison has already taken care of this. 

## 2023-01-26 ENCOUNTER — Ambulatory Visit: Payer: 59 | Admitting: Internal Medicine

## 2023-02-02 ENCOUNTER — Ambulatory Visit: Payer: 59 | Admitting: Internal Medicine

## 2023-02-06 ENCOUNTER — Ambulatory Visit (INDEPENDENT_AMBULATORY_CARE_PROVIDER_SITE_OTHER): Payer: 59 | Admitting: Internal Medicine

## 2023-02-06 ENCOUNTER — Other Ambulatory Visit: Payer: Self-pay | Admitting: Internal Medicine

## 2023-02-06 ENCOUNTER — Encounter: Payer: Self-pay | Admitting: Internal Medicine

## 2023-02-06 VITALS — BP 130/78 | HR 68 | Temp 98.1°F | Ht 71.0 in | Wt 380.6 lb

## 2023-02-06 DIAGNOSIS — E1169 Type 2 diabetes mellitus with other specified complication: Secondary | ICD-10-CM | POA: Diagnosis not present

## 2023-02-06 DIAGNOSIS — I152 Hypertension secondary to endocrine disorders: Secondary | ICD-10-CM

## 2023-02-06 DIAGNOSIS — E785 Hyperlipidemia, unspecified: Secondary | ICD-10-CM

## 2023-02-06 DIAGNOSIS — R7309 Other abnormal glucose: Secondary | ICD-10-CM

## 2023-02-06 DIAGNOSIS — E611 Iron deficiency: Secondary | ICD-10-CM | POA: Diagnosis not present

## 2023-02-06 DIAGNOSIS — R718 Other abnormality of red blood cells: Secondary | ICD-10-CM | POA: Diagnosis not present

## 2023-02-06 DIAGNOSIS — E786 Lipoprotein deficiency: Secondary | ICD-10-CM | POA: Diagnosis not present

## 2023-02-06 DIAGNOSIS — E1159 Type 2 diabetes mellitus with other circulatory complications: Secondary | ICD-10-CM

## 2023-02-06 DIAGNOSIS — M170 Bilateral primary osteoarthritis of knee: Secondary | ICD-10-CM

## 2023-02-06 MED ORDER — TIRZEPATIDE 7.5 MG/0.5ML ~~LOC~~ SOAJ: 7.5000 mg | SUBCUTANEOUS | 5 refills | Status: DC

## 2023-02-06 MED ORDER — PRALUENT 150 MG/ML ~~LOC~~ SOAJ: 150.0000 mg | SUBCUTANEOUS | 11 refills | Status: DC

## 2023-02-06 MED ORDER — CELECOXIB 200 MG PO CAPS: 200.0000 mg | ORAL_CAPSULE | Freq: Two times a day (BID) | ORAL | 2 refills | Status: DC

## 2023-02-06 MED ORDER — ROSUVASTATIN CALCIUM 40 MG PO TABS: 40.0000 mg | ORAL_TABLET | Freq: Every day | ORAL | 3 refills | Status: DC

## 2023-02-06 MED ORDER — REPATHA 140 MG/ML ~~LOC~~ SOSY: 140.0000 mg | PREFILLED_SYRINGE | SUBCUTANEOUS | 11 refills | Status: DC

## 2023-02-06 NOTE — Assessment & Plan Note (Signed)
Encouraged continuing with metformin, and increase Mounjaro. Will add additional medications and encourage bariatric surgery but did not discuss today. Encouraged continuing with diet and exercise efforts- he fell off this month(s) due to birthdays- advised filling up with healthy options to avoid getting triggered.

## 2023-02-06 NOTE — Assessment & Plan Note (Signed)
Borderline controlled Encouraged continuing with  Current hypertension medications:       Sig   amLODipine (NORVASC) 10 MG tablet (Taking) Take 1 tablet (10 mg total) by mouth daily.   bisoprolol (ZEBETA) 10 MG tablet (Taking) TAKE 2 TABLETS BY MOUTH EVERY DAY   furosemide (LASIX) 40 MG tablet (Taking) Take 1 tablet (40 mg total) by mouth daily. Take extra 40 mg for 3 pound weight gain in 24 hours, or swelling or shortness of breath   hydrALAZINE (APRESOLINE) 50 MG tablet (Taking) TAKE 1 TABLET BY MOUTH TWICE A DAY   olmesartan (BENICAR) 40 MG tablet (Taking) Take 1 tablet (40 mg total) by mouth daily.   sildenafil (REVATIO) 20 MG tablet (Taking) 2-5 pills once daily as needed for erectile dysfunction      Encouraged continuing with home checks.

## 2023-02-06 NOTE — Assessment & Plan Note (Addendum)
We discussed recent labs and failure to reach Low Density Lipoprotein (LDL cholesterol) goal of 55 Tried to get repatha/praluent, with rosuvastatin 40 as backup plan, repatha was refused, so sent praluent - both were flatly rejected by pharmacy and criteria are unreachable when reviewed online.

## 2023-02-06 NOTE — Assessment & Plan Note (Signed)
We did gust his last sugar was 6 months ago he did the A1c it was 5.9 and that was pretty good and since we did not need any other labs except for an A1c today and he did prefer not to get a fingerstick we decided to hold off till next visit to do the A1c along with other labs particularly when I recheck his cholesterol after we are changing his cholesterol medication today.  He will be able to get the next insurance covered payment for that for at least 3 months from the medication change were pulling off today so we will plan to see him back in 3 months and check an A1c then.  Will go ahead and increase the Mounjaro to try to continue to keep good control and lose weight but she has not some weight due to birthdays I encouraged him to manage birthdays by eating a whole bunch of healthy food before he goes so that he will be tempted by the sugary sweets.

## 2023-02-06 NOTE — Assessment & Plan Note (Addendum)
Severe, primarily knees Taking meloxicam- not controlling; will try to get celecoxib Not wearing knee brace Never did physical therapy Orthopedist did knee injection last year(s), encouraged repeat that. He is afraid of surgery. Reassured surgery has good outcomes especially if he ctn

## 2023-02-06 NOTE — Progress Notes (Signed)
[facility]: [id number]   Routine Medical Office Visit  Patient:  Jason Meyer      Age: 62 y.o.       Sex:  male  Date:   02/06/2023  PCP:    Loralee Pacas, [place] Provider: Loralee Pacas, MD   Problem Focused Charting:   Medical Decision Making per Assessment/Plan   Rush was seen today for 1 month re-check.  Hyperlipidemia associated with type 2 diabetes mellitus ([place]) Assessment & Plan: We discussed recent labs and failure to reach Low Density Lipoprotein (LDL cholesterol) goal of 55 Tried to get repatha/praluent, with rosuvastatin 40 as backup plan, repatha was refused, so sent praluent  Orders: -     Rosuvastatin Calcium; Take 1 tablet (40 mg total) by mouth daily. Replaces atorvastatin which failed to reach Low Density Lipoprotein (LDL cholesterol) goal of 55, take only if repatha not approved/available.  Dispense: 90 tablet; Refill: 3 -     Praluent; Inject 150 mg into the skin every 14 (fourteen) days.  Dispense: 2 mL; Refill: 11  Type 2 diabetes mellitus with other specified complication, without long-term current use of insulin (HCC) Assessment & Plan: We did gust his last sugar was 6 months ago he did the A1c it was 5.9 and that was pretty good and since we did not need any other labs except for an A1c today and he did prefer not to get a fingerstick we decided to hold off till next visit to do the A1c along with other labs particularly when I recheck his cholesterol after we are changing his cholesterol medication today.  He will be able to get the next insurance covered payment for that for at least 3 months from the medication change were pulling off today so we will plan to see him back in 3 months and check an A1c then.  Will go ahead and increase the Mounjaro to try to continue to keep good control and lose weight but she has not some weight due to birthdays I encouraged him to manage birthdays by eating a whole bunch  of healthy food before he goes so that he will be tempted by the sugary sweets.  Orders: -     Tirzepatide; Inject 7.5 mg into the skin once a week. Replaces 5 mg dose.  Dispense: 6 mL; Refill: 5  Low HDL (under 40)  Elevated glucose level  Iron deficiency  Abnormal RBC  Hypertension associated with diabetes ([place]) Overview: D/c acei and lopressor 10/13/2021 due to concerns adverse drug effects  Assessment & Plan: Borderline controlled Encouraged continuing with  Current hypertension medications:       Sig   amLODipine (NORVASC) 10 MG tablet (Taking) Take 1 tablet (10 mg total) by mouth daily.   bisoprolol (ZEBETA) 10 MG tablet (Taking) TAKE 2 TABLETS BY MOUTH EVERY DAY   furosemide (LASIX) 40 MG tablet (Taking) Take 1 tablet (40 mg total) by mouth daily. Take extra 40 mg for 3 pound weight gain in 24 hours, or swelling or shortness of breath   hydrALAZINE (APRESOLINE) 50 MG tablet (Taking) TAKE 1 TABLET BY MOUTH TWICE A DAY   olmesartan (BENICAR) 40 MG tablet (Taking) Take 1 tablet (40 mg total) by mouth daily.   sildenafil (REVATIO) 20 MG tablet (Taking) 2-5 pills once daily as needed for erectile dysfunction      Encouraged continuing with home checks.   Morbid obesity ([place]) Overview: Body mass index is 53.89  kg/m.  Has tried a salad based diet in the past but no formal diet like weight watchers Never been on any medication for weight loss as of 09/14/2022 but will order to start trying His skin he would be scared to have weight loss surgery   Assessment & Plan: Encouraged continuing with metformin, and increase Mounjaro. Will add additional medications and encourage bariatric surgery but did not discuss today. Encouraged continuing with diet and exercise efforts- he fell off this month(s) due to birthdays- advised filling up with healthy options to avoid getting triggered.   Osteoarthritis of both knees, unspecified osteoarthritis type Overview: Was seen by sports  medicine and had injections in both knees in September 2023 and it helped some  XR 08/2022  of each knee X-ray shows 9 degree varus deformity grade 4 arthritis noted medial compartment with extensive joint space narrowing and the patient has significant peripheral osteophytes  Impression grade 4 arthritis with moderate varus deformity  Xray left shoulder 2016 Acromioclavicular glenohumeral degenerative change. No acute abnormality.   On: 08/19/2015 12:45  Assessment & Plan: Severe, primarily knees Taking meloxicam- not controlling; will try to get celecoxib Not wearing knee brace Never did physical therapy Orthopedist did knee injection last year(s), encouraged repeat that. He is afraid of surgery. Reassured surgery has good outcomes especially if he ctn   Orders: -     Celecoxib; Take 1 capsule (200 mg total) by mouth 2 (two) times daily. Replaces meloxicam  Dispense: 60 capsule; Refill: 2        Subjective - Clinical Presentation:   Jason Meyer is a 62 y.o. male  Patient Active Problem List   Diagnosis Date Noted   Microcytosis 12/27/2022   Coronary artery disease 12/26/2022   OSA (obstructive sleep apnea) 10/19/2022   Erectile dysfunction 10/19/2022   Low HDL (under 40) 10/19/2022   Elevated PSA 10/19/2022   Coronary artery calcification 10/19/2022   Chronic diastolic heart failure ([place]) 08/11/2022   Venous stasis dermatitis of both lower extremities 08/11/2022   DOE (dyspnea on exertion) 06/22/2021   OA (osteoarthritis) 11/23/2015   Venous stasis 09/20/2015   Type 2 diabetes mellitus with other specified complication ([place]) [id number]   Hypertension associated with diabetes ([place]) 04/23/2013   Morbid obesity ([place]) 04/23/2013   Hyperlipidemia associated with type 2 diabetes mellitus ([place]) 04/23/2013   Past Medical History:  Diagnosis Date   Allergy    Arthritis    Chronic diastolic heart failure ([place]) 08/11/2022   Admitted with acute hypoxemic respiratory  failure in the setting of acute diastolic CHF exacerbation      Coronary artery calcification 10/19/2022   Noted on CTA  which also noted Cardiac enlargement, bilateral pleural effusions and diffuse ground-glass attenuation suggestive of mild CHF. 07/2022 referral to cardiology   Hyperlipidemia    Hypertension    OSA (obstructive sleep apnea) 10/19/2022   Sleep apnea    Type 2 diabetes mellitus ([place])     Outpatient Medications Prior to Visit  Medication Sig   albuterol (PROVENTIL) (2.5 MG/3ML) 0.083% nebulizer solution Take 3 mLs (2.5 mg total) by nebulization every 6 (six) hours as needed for wheezing or shortness of breath.   albuterol (VENTOLIN HFA) 108 (90 Base) MCG/ACT inhaler Inhale 2 puffs into the lungs every 6 (six) hours as needed for wheezing or shortness of breath.   amLODipine (NORVASC) 10 MG tablet Take 1 tablet (10 mg total) by mouth daily.   azelastine (ASTELIN) 0.1 % nasal spray Place 1 spray  into both nostrils 2 (two) times daily. Use in each nostril as directed   bisoprolol (ZEBETA) 10 MG tablet TAKE 2 TABLETS BY MOUTH EVERY DAY   cephALEXin (KEFLEX) 500 MG capsule Take 1 capsule (500 mg total) by mouth 3 (three) times daily. Should be safe with penicillin allergy but small chance of allergy- call if it happens   clotrimazole-betamethasone (LOTRISONE) cream APPLY TO AFFECTED AREA TWICE A DAY   fluticasone (FLONASE) 50 MCG/ACT nasal spray Place 2 sprays into both nostrils daily.   fluticasone furoate-vilanterol (BREO ELLIPTA) 100-25 MCG/ACT AEPB Inhale 1 puff into the lungs daily.   furosemide (LASIX) 40 MG tablet Take 1 tablet (40 mg total) by mouth daily. Take extra 40 mg for 3 pound weight gain in 24 hours, or swelling or shortness of breath   guaiFENesin (MUCINEX) 600 MG 12 hr tablet Take 1 tablet (600 mg total) by mouth 2 (two) times daily.   hydrALAZINE (APRESOLINE) 50 MG tablet TAKE 1 TABLET BY MOUTH TWICE A DAY   metFORMIN (GLUCOPHAGE) 1000 MG tablet Take 1,000 mg by  mouth 2 (two) times daily.   olmesartan (BENICAR) 40 MG tablet Take 1 tablet (40 mg total) by mouth daily.   potassium chloride (MICRO-K) 10 MEQ CR capsule Take 1 capsule (10 mEq total) by mouth daily.   sildenafil (REVATIO) 20 MG tablet 2-5 pills once daily as needed for erectile dysfunction   [DISCONTINUED] atorvastatin (LIPITOR) 80 MG tablet Take 1 tablet (80 mg total) by mouth daily.   [DISCONTINUED] meloxicam (MOBIC) 7.5 MG tablet Take 7.5 mg by mouth daily.   [DISCONTINUED] tirzepatide [facility]) 5 MG/0.5ML Pen Inject 5 mg into the skin once a week.   No facility-administered medications prior to visit.    Chief Complaint  Patient presents with   1 month re-check    HPI  Gained weight back due to birthday parties Patient reports compliant with medication as prescribed...but metformin Mounjaro didn't prevent(s) or enable him to avoid getting triggered  Wants to talk about knees- they still hurt, injection(s) didn't help much.. Same with meloxicam Wants to review January labwork... We did, not at cholesterol goal. Feels heart failure in remission due to lasix.   Feels urinary tract infection (UTI) we treated with keflex last month cleared up        Objective:  Physical Exam  BP 130/78 (BP Location: Right Arm, Patient Position: Sitting)   Pulse 68   Temp 98.1 F (36.7 C) (Temporal)   Ht '5\' 11"'$  (1.803 m)   Wt (!) 380 lb 9.6 oz (172.6 kg)   SpO2 94%   BMI 53.08 kg/m  Severely obese  by BMI criteria but truncal adiposity (waist circumference or caliper) should be used instead. Wt Readings from Last 10 Encounters:  02/06/23 (!) 380 lb 9.6 oz (172.6 kg)  12/26/22 (!) 376 lb 3.2 oz (170.6 kg)  10/19/22 (!) 390 lb (176.9 kg)  09/14/22 (!) 389 lb 6.4 oz (176.6 kg)  08/23/22 (!) 388 lb (176 kg)  08/11/22 (!) 386 lb 6.4 oz (175.3 kg)  08/01/22 (!) 384 lb 6.4 oz (174.4 kg)  07/14/22 (!) 399 lb (181 kg)  04/27/22 (!) 388 lb (176 kg)  03/29/22 (!) 386 lb (175.1 kg)   Vital signs  reviewed.  Nursing notes reviewed. Weight trend reviewed. General Appearance:  Well developed, well nourished male in no acute distress.   Normal work of breathing at rest Musculoskeletal: All extremities are intact.  Neurological:  Awake, alert,  No obvious  focal neurological deficits or cognitive impairments Psychiatric:  Appropriate mood, pleasant demeanor Problem-specific findings:  severe truncal adiposity    Results Reviewed: No results found for any visits on 02/06/23.  Recent Results (from the past 2160 hour(s))  CBC     Status: Abnormal   Collection Time: 12/26/22 12:00 PM  Result Value Ref Range   WBC 10.1 4.0 - 10.5 K/uL   RBC 5.71 4.22 - 5.81 Mil/uL   Platelets 331.0 150.0 - 400.0 K/uL   Hemoglobin 14.3 13.0 - 17.0 g/dL   HCT 44.1 39.0 - 52.0 %   MCV 77.2 (L) 78.0 - 100.0 fl   MCHC 32.5 30.0 - 36.0 g/dL   RDW 17.2 (H) 11.5 - 15.5 %  Comp Met (CMET)     Status: Abnormal   Collection Time: 12/26/22 12:00 PM  Result Value Ref Range   Sodium 141 135 - 145 mEq/L   Potassium 4.3 3.5 - 5.1 mEq/L   Chloride 103 96 - 112 mEq/L   CO2 29 19 - 32 mEq/L   Glucose, Bld 103 (H) 70 - 99 mg/dL   BUN 11 6 - 23 mg/dL   Creatinine, Ser 1.17 0.40 - 1.50 mg/dL   Total Bilirubin 0.5 0.2 - 1.2 mg/dL   Alkaline Phosphatase 95 39 - 117 U/L   AST 13 0 - 37 U/L   ALT 12 0 - 53 U/L   Total Protein 7.2 6.0 - 8.3 g/dL   Albumin 4.0 3.5 - 5.2 g/dL   GFR 67.96 >60.00 mL/min    Comment: Calculated using the CKD-EPI Creatinine Equation (2021)   Calcium 9.5 8.4 - 10.5 mg/dL  Lipid panel     Status: Abnormal   Collection Time: 12/26/22 12:00 PM  Result Value Ref Range   Cholesterol 139 0 - 200 mg/dL    Comment: ATP III Classification       Desirable:  < 200 mg/dL               Borderline High:  200 - 239 mg/dL          High:  > = 240 mg/dL   Triglycerides 74.0 0.0 - 149.0 mg/dL    Comment: Normal:  <150 mg/dLBorderline High:  150 - 199 mg/dL   HDL 30.50 (L) >39.00 mg/dL   VLDL 14.8 0.0 - 40.0  mg/dL   LDL Cholesterol 94 0 - 99 mg/dL   Total CHOL/HDL Ratio 5     Comment:                Men          Women1/2 Average Risk     3.4          3.3Average Risk          5.0          4.42X Average Risk          9.6          7.13X Average Risk          15.0          11.0                       NonHDL 108.76     Comment: NOTE:  Non-HDL goal should be 30 mg/dL higher than patient's LDL goal (i.e. LDL goal of < 70 mg/dL, would have non-HDL goal of < 100 mg/dL)  Microalbumin / creatinine urine ratio     Status: None  Collection Time: 12/26/22 12:00 PM  Result Value Ref Range   Microalb, Ur 1.7 0.0 - 1.9 mg/dL   Creatinine,U 61.7 mg/dL   Microalb Creat Ratio 2.7 0.0 - 30.0 mg/g  POCT Urinalysis Dipstick     Status: Abnormal   Collection Time: 12/26/22 12:58 PM  Result Value Ref Range   Color, UA yellow    Clarity, UA cloudy    Glucose, UA Negative Negative   Bilirubin, UA Negative    Ketones, UA Negative    Spec Grav, UA 1.015 1.010 - 1.025   Blood, UA Negative    pH, UA 5.0 5.0 - 8.0   Protein, UA Negative Negative   Urobilinogen, UA 0.2 0.2 or 1.0 E.U./dL   Nitrite, UA Postive    Leukocytes, UA Moderate (2+) (A) Negative   Appearance     Odor    Urine Culture     Status: Abnormal   Collection Time: 12/26/22  1:00 PM   Specimen: Urine  Result Value Ref Range   MICRO NUMBER: [id number]    SPECIMEN QUALITY: Adequate    Sample Source URINE    STATUS: FINAL    ISOLATE 1: Klebsiella pneumoniae (A)     Comment: Greater than 100,000 CFU/mL of Klebsiella pneumoniae      Susceptibility   Klebsiella pneumoniae - URINE CULTURE, REFLEX    AMOX/CLAVULANIC <=2 Sensitive     AMPICILLIN >=32 Resistant     AMPICILLIN/SULBACTAM 4 Sensitive     CEFAZOLIN* <=4 Not Reportable      * For infections other than uncomplicated UTI caused by E. coli, K. pneumoniae or P. mirabilis: Cefazolin is resistant if MIC > or = 8 mcg/mL. (Distinguishing susceptible versus intermediate for isolates with MIC < or  = 4 mcg/mL requires additional testing.) For uncomplicated UTI caused by E. coli, K. pneumoniae or P. mirabilis: Cefazolin is susceptible if MIC <32 mcg/mL and predicts susceptible to the oral agents cefaclor, cefdinir, cefpodoxime, cefprozil, cefuroxime, cephalexin and loracarbef.     CEFTAZIDIME <=1 Sensitive     CEFEPIME <=1 Sensitive     CEFTRIAXONE <=1 Sensitive     CIPROFLOXACIN <=0.25 Sensitive     LEVOFLOXACIN <=0.12 Sensitive     GENTAMICIN <=1 Sensitive     IMIPENEM <=0.25 Sensitive     NITROFURANTOIN 64 Intermediate     PIP/TAZO <=4 Sensitive     TOBRAMYCIN <=1 Sensitive     TRIMETH/SULFA* <=20 Sensitive      * For infections other than uncomplicated UTI caused by E. coli, K. pneumoniae or P. mirabilis: Cefazolin is resistant if MIC > or = 8 mcg/mL. (Distinguishing susceptible versus intermediate for isolates with MIC < or = 4 mcg/mL requires additional testing.) For uncomplicated UTI caused by E. coli, K. pneumoniae or P. mirabilis: Cefazolin is susceptible if MIC <32 mcg/mL and predicts susceptible to the oral agents cefaclor, cefdinir, cefpodoxime, cefprozil, cefuroxime, cephalexin and loracarbef. Legend: S = Susceptible  I = Intermediate R = Resistant  NS = Not susceptible * = Not tested  NR = Not reported **NN = See antimicrobic comments          Signed: Loralee Pacas, MD 02/06/2023 3:27 PM

## 2023-02-08 ENCOUNTER — Encounter: Payer: Self-pay | Admitting: Radiology

## 2023-03-09 ENCOUNTER — Other Ambulatory Visit: Payer: Self-pay | Admitting: Family Medicine

## 2023-03-09 DIAGNOSIS — E662 Morbid (severe) obesity with alveolar hypoventilation: Secondary | ICD-10-CM

## 2023-03-09 DIAGNOSIS — R0602 Shortness of breath: Secondary | ICD-10-CM

## 2023-03-22 ENCOUNTER — Other Ambulatory Visit: Payer: Self-pay | Admitting: Family Medicine

## 2023-03-22 ENCOUNTER — Other Ambulatory Visit: Payer: Self-pay | Admitting: Internal Medicine

## 2023-03-22 DIAGNOSIS — E1169 Type 2 diabetes mellitus with other specified complication: Secondary | ICD-10-CM

## 2023-03-27 ENCOUNTER — Other Ambulatory Visit: Payer: Self-pay | Admitting: Internal Medicine

## 2023-03-27 DIAGNOSIS — R600 Localized edema: Secondary | ICD-10-CM

## 2023-04-02 ENCOUNTER — Other Ambulatory Visit: Payer: Self-pay | Admitting: Family Medicine

## 2023-04-02 DIAGNOSIS — I10 Essential (primary) hypertension: Secondary | ICD-10-CM

## 2023-04-04 ENCOUNTER — Other Ambulatory Visit: Payer: Self-pay | Admitting: Family Medicine

## 2023-04-04 DIAGNOSIS — E662 Morbid (severe) obesity with alveolar hypoventilation: Secondary | ICD-10-CM

## 2023-04-04 DIAGNOSIS — R0602 Shortness of breath: Secondary | ICD-10-CM

## 2023-04-05 ENCOUNTER — Other Ambulatory Visit: Payer: Self-pay | Admitting: Internal Medicine

## 2023-04-05 ENCOUNTER — Other Ambulatory Visit: Payer: Self-pay | Admitting: Family Medicine

## 2023-04-12 ENCOUNTER — Inpatient Hospital Stay (HOSPITAL_COMMUNITY)
Admission: EM | Admit: 2023-04-12 | Discharge: 2023-04-17 | DRG: 871 | Disposition: A | Discharge status: Home / Self Care | Payer: 59 | Attending: Family Medicine | Admitting: Family Medicine

## 2023-04-12 ENCOUNTER — Emergency Department (HOSPITAL_COMMUNITY): Payer: 59

## 2023-04-12 ENCOUNTER — Other Ambulatory Visit: Payer: Self-pay

## 2023-04-12 ENCOUNTER — Encounter (HOSPITAL_COMMUNITY): Payer: Self-pay | Admitting: Emergency Medicine

## 2023-04-12 DIAGNOSIS — M170 Bilateral primary osteoarthritis of knee: Secondary | ICD-10-CM

## 2023-04-12 DIAGNOSIS — R600 Localized edema: Secondary | ICD-10-CM

## 2023-04-12 DIAGNOSIS — Z1152 Encounter for screening for COVID-19: Secondary | ICD-10-CM | POA: Diagnosis not present

## 2023-04-12 DIAGNOSIS — I2510 Atherosclerotic heart disease of native coronary artery without angina pectoris: Secondary | ICD-10-CM | POA: Diagnosis present

## 2023-04-12 DIAGNOSIS — J189 Pneumonia, unspecified organism: Secondary | ICD-10-CM

## 2023-04-12 DIAGNOSIS — G4733 Obstructive sleep apnea (adult) (pediatric): Secondary | ICD-10-CM | POA: Diagnosis present

## 2023-04-12 DIAGNOSIS — Z79899 Other long term (current) drug therapy: Secondary | ICD-10-CM

## 2023-04-12 DIAGNOSIS — J9601 Acute respiratory failure with hypoxia: Secondary | ICD-10-CM

## 2023-04-12 DIAGNOSIS — D509 Iron deficiency anemia, unspecified: Secondary | ICD-10-CM | POA: Diagnosis present

## 2023-04-12 DIAGNOSIS — Z7985 Long-term (current) use of injectable non-insulin antidiabetic drugs: Secondary | ICD-10-CM | POA: Diagnosis not present

## 2023-04-12 DIAGNOSIS — Y92239 Unspecified place in hospital as the place of occurrence of the external cause: Secondary | ICD-10-CM | POA: Diagnosis present

## 2023-04-12 DIAGNOSIS — J440 Chronic obstructive pulmonary disease with acute lower respiratory infection: Secondary | ICD-10-CM | POA: Diagnosis present

## 2023-04-12 DIAGNOSIS — E662 Morbid (severe) obesity with alveolar hypoventilation: Secondary | ICD-10-CM

## 2023-04-12 DIAGNOSIS — R6520 Severe sepsis without septic shock: Secondary | ICD-10-CM | POA: Diagnosis present

## 2023-04-12 DIAGNOSIS — T380X5A Adverse effect of glucocorticoids and synthetic analogues, initial encounter: Secondary | ICD-10-CM | POA: Diagnosis present

## 2023-04-12 DIAGNOSIS — Z7984 Long term (current) use of oral hypoglycemic drugs: Secondary | ICD-10-CM

## 2023-04-12 DIAGNOSIS — Z8249 Family history of ischemic heart disease and other diseases of the circulatory system: Secondary | ICD-10-CM | POA: Diagnosis not present

## 2023-04-12 DIAGNOSIS — I110 Hypertensive heart disease with heart failure: Secondary | ICD-10-CM | POA: Diagnosis present

## 2023-04-12 DIAGNOSIS — Z87891 Personal history of nicotine dependence: Secondary | ICD-10-CM | POA: Diagnosis not present

## 2023-04-12 DIAGNOSIS — I472 Ventricular tachycardia, unspecified: Secondary | ICD-10-CM | POA: Diagnosis not present

## 2023-04-12 DIAGNOSIS — R718 Other abnormality of red blood cells: Secondary | ICD-10-CM | POA: Diagnosis not present

## 2023-04-12 DIAGNOSIS — R590 Localized enlarged lymph nodes: Secondary | ICD-10-CM | POA: Diagnosis present

## 2023-04-12 DIAGNOSIS — I959 Hypotension, unspecified: Secondary | ICD-10-CM | POA: Diagnosis present

## 2023-04-12 DIAGNOSIS — I10 Essential (primary) hypertension: Secondary | ICD-10-CM | POA: Diagnosis not present

## 2023-04-12 DIAGNOSIS — A419 Sepsis, unspecified organism: Principal | ICD-10-CM | POA: Insufficient documentation

## 2023-04-12 DIAGNOSIS — D563 Thalassemia minor: Secondary | ICD-10-CM

## 2023-04-12 DIAGNOSIS — E1169 Type 2 diabetes mellitus with other specified complication: Secondary | ICD-10-CM

## 2023-04-12 DIAGNOSIS — N179 Acute kidney failure, unspecified: Secondary | ICD-10-CM | POA: Diagnosis present

## 2023-04-12 DIAGNOSIS — R0602 Shortness of breath: Secondary | ICD-10-CM

## 2023-04-12 DIAGNOSIS — Z820 Family history of epilepsy and other diseases of the nervous system: Secondary | ICD-10-CM

## 2023-04-12 DIAGNOSIS — Z6841 Body Mass Index (BMI) 40.0 and over, adult: Secondary | ICD-10-CM

## 2023-04-12 DIAGNOSIS — I5032 Chronic diastolic (congestive) heart failure: Secondary | ICD-10-CM | POA: Diagnosis present

## 2023-04-12 DIAGNOSIS — E1165 Type 2 diabetes mellitus with hyperglycemia: Secondary | ICD-10-CM | POA: Diagnosis present

## 2023-04-12 DIAGNOSIS — I152 Hypertension secondary to endocrine disorders: Secondary | ICD-10-CM

## 2023-04-12 DIAGNOSIS — E782 Mixed hyperlipidemia: Secondary | ICD-10-CM

## 2023-04-12 DIAGNOSIS — Z823 Family history of stroke: Secondary | ICD-10-CM

## 2023-04-12 HISTORY — DX: Sepsis, unspecified organism: A41.9

## 2023-04-12 HISTORY — DX: Pneumonia, unspecified organism: J18.9

## 2023-04-12 LAB — IRON AND TIBC
Iron: 8 ug/dL — ABNORMAL LOW (ref 45–182)
Saturation Ratios: 3 % — ABNORMAL LOW (ref 17.9–39.5)
TIBC: 287 ug/dL (ref 250–450)
UIBC: 279 ug/dL

## 2023-04-12 LAB — COMPREHENSIVE METABOLIC PANEL
ALT: 59 U/L — ABNORMAL HIGH (ref 0–44)
AST: 20 U/L (ref 15–41)
Albumin: 3.6 g/dL (ref 3.5–5.0)
Alkaline Phosphatase: 120 U/L (ref 38–126)
Anion gap: 10 (ref 5–15)
BUN: 15 mg/dL (ref 6–20)
CO2: 23 mmol/L (ref 22–32)
Calcium: 8.9 mg/dL (ref 8.9–10.3)
Chloride: 102 mmol/L (ref 98–111)
Creatinine, Ser: 1.64 mg/dL — ABNORMAL HIGH (ref 0.61–1.24)
GFR, Estimated: 48 mL/min — ABNORMAL LOW (ref >60)
Glucose, Bld: 187 mg/dL — ABNORMAL HIGH (ref 70–99)
Potassium: 4 mmol/L (ref 3.5–5.1)
Sodium: 135 mmol/L (ref 135–145)
Total Bilirubin: 1.1 mg/dL (ref 0.3–1.2)
Total Protein: 8 g/dL (ref 6.5–8.1)

## 2023-04-12 LAB — URINALYSIS, W/ REFLEX TO CULTURE (INFECTION SUSPECTED)
Bacteria, UA: NONE SEEN
Bilirubin Urine: NEGATIVE
Glucose, UA: NEGATIVE mg/dL
Ketones, ur: NEGATIVE mg/dL
Nitrite: NEGATIVE
Protein, ur: 100 mg/dL — AB
Specific Gravity, Urine: 1.03 (ref 1.005–1.030)
pH: 5 (ref 5.0–8.0)

## 2023-04-12 LAB — CBC WITH DIFFERENTIAL/PLATELET
Abs Immature Granulocytes: 0.14 10*3/uL — ABNORMAL HIGH (ref 0.00–0.07)
Basophils Absolute: 0.1 10*3/uL (ref 0.0–0.1)
Basophils Relative: 0 %
Eosinophils Absolute: 0.1 10*3/uL (ref 0.0–0.5)
Eosinophils Relative: 0 %
HCT: 48.1 % (ref 39.0–52.0)
Hemoglobin: 15.2 g/dL (ref 13.0–17.0)
Immature Granulocytes: 1 %
Lymphocytes Relative: 4 %
Lymphs Abs: 0.8 10*3/uL (ref 0.7–4.0)
MCH: 24.5 pg — ABNORMAL LOW (ref 26.0–34.0)
MCHC: 31.6 g/dL (ref 30.0–36.0)
MCV: 77.5 fL — ABNORMAL LOW (ref 80.0–100.0)
Monocytes Absolute: 0.9 10*3/uL (ref 0.1–1.0)
Monocytes Relative: 5 %
Neutro Abs: 18.4 10*3/uL — ABNORMAL HIGH (ref 1.7–7.7)
Neutrophils Relative %: 90 %
Platelets: 216 10*3/uL (ref 150–400)
RBC: 6.21 MIL/uL — ABNORMAL HIGH (ref 4.22–5.81)
RDW: 18.2 % — ABNORMAL HIGH (ref 11.5–15.5)
WBC: 20.4 10*3/uL — ABNORMAL HIGH (ref 4.0–10.5)
nRBC: 0 % (ref 0.0–0.2)

## 2023-04-12 LAB — CBG MONITORING, ED
Glucose-Capillary: 169 mg/dL — ABNORMAL HIGH (ref 70–99)
Glucose-Capillary: 169 mg/dL — ABNORMAL HIGH (ref 70–99)

## 2023-04-12 LAB — RESP PANEL BY RT-PCR (RSV, FLU A&B, COVID)  RVPGX2
Influenza A by PCR: NEGATIVE
Influenza B by PCR: NEGATIVE
Resp Syncytial Virus by PCR: NEGATIVE
SARS Coronavirus 2 by RT PCR: NEGATIVE

## 2023-04-12 LAB — HEMOGLOBIN A1C
Hgb A1c MFr Bld: 6 % — ABNORMAL HIGH (ref 4.8–5.6)
Mean Plasma Glucose: 125.5 mg/dL

## 2023-04-12 LAB — GLUCOSE, CAPILLARY
Glucose-Capillary: 143 mg/dL — ABNORMAL HIGH (ref 70–99)
Glucose-Capillary: 107 mg/dL — ABNORMAL HIGH (ref 70–99)
Glucose-Capillary: 142 mg/dL — ABNORMAL HIGH (ref 70–99)

## 2023-04-12 LAB — MRSA NEXT GEN BY PCR, NASAL: MRSA by PCR Next Gen: NOT DETECTED

## 2023-04-12 LAB — LACTIC ACID, PLASMA
Lactic Acid, Venous: 1.7 mmol/L (ref 0.5–1.9)
Lactic Acid, Venous: 1.3 mmol/L (ref 0.5–1.9)

## 2023-04-12 LAB — APTT: aPTT: 33 seconds (ref 24–36)

## 2023-04-12 LAB — PHOSPHORUS: Phosphorus: 1.8 mg/dL — ABNORMAL LOW (ref 2.5–4.6)

## 2023-04-12 LAB — FERRITIN: Ferritin: 90 ng/mL (ref 24–336)

## 2023-04-12 LAB — PROTIME-INR
INR: 1.2 (ref 0.8–1.2)
Prothrombin Time: 15.2 seconds (ref 11.4–15.2)

## 2023-04-12 LAB — STREP PNEUMONIAE URINARY ANTIGEN: Strep Pneumo Urinary Antigen: NEGATIVE

## 2023-04-12 LAB — PROCALCITONIN: Procalcitonin: 5.67 ng/mL

## 2023-04-12 LAB — CORTISOL-AM, BLOOD: Cortisol - AM: 25.4 ug/dL — ABNORMAL HIGH (ref 6.7–22.6)

## 2023-04-12 LAB — HIV ANTIBODY (ROUTINE TESTING W REFLEX): HIV Screen 4th Generation wRfx: NONREACTIVE

## 2023-04-12 LAB — MAGNESIUM: Magnesium: 1.7 mg/dL (ref 1.7–2.4)

## 2023-04-12 MED ORDER — ENOXAPARIN SODIUM 40 MG/0.4ML IJ SOSY
40.0000 mg | PREFILLED_SYRINGE | INTRAMUSCULAR | Status: DC
  Administered 2023-04-12: 40 mg via SUBCUTANEOUS
  Filled 2023-04-12: qty 0.4

## 2023-04-12 MED ORDER — SODIUM CHLORIDE 0.9 % IV SOLN: INTRAVENOUS | Status: DC

## 2023-04-12 MED ORDER — ENOXAPARIN SODIUM 80 MG/0.8ML IJ SOSY
80.0000 mg | PREFILLED_SYRINGE | INTRAMUSCULAR | Status: DC
  Administered 2023-04-13 – 2023-04-17 (×5): 80 mg via SUBCUTANEOUS
  Filled 2023-04-12 (×5): qty 0.8

## 2023-04-12 MED ORDER — ACETAMINOPHEN 650 MG RE SUPP: 650.0000 mg | Freq: Four times a day (QID) | RECTAL | Status: DC | PRN

## 2023-04-12 MED ORDER — FLUTICASONE FUROATE-VILANTEROL 100-25 MCG/ACT IN AEPB
1.0000 | INHALATION_SPRAY | Freq: Every day | RESPIRATORY_TRACT | Status: DC
  Administered 2023-04-13 – 2023-04-17 (×5): 1 via RESPIRATORY_TRACT
  Filled 2023-04-12: qty 28

## 2023-04-12 MED ORDER — ROSUVASTATIN CALCIUM 20 MG PO TABS
40.0000 mg | ORAL_TABLET | Freq: Every day | ORAL | Status: DC
  Administered 2023-04-12 – 2023-04-17 (×6): 40 mg via ORAL
  Filled 2023-04-12 (×6): qty 2

## 2023-04-12 MED ORDER — ALBUTEROL SULFATE (2.5 MG/3ML) 0.083% IN NEBU
2.5000 mg | INHALATION_SOLUTION | Freq: Four times a day (QID) | RESPIRATORY_TRACT | Status: DC | PRN
  Administered 2023-04-14 – 2023-04-15 (×4): 2.5 mg via RESPIRATORY_TRACT
  Filled 2023-04-12 (×4): qty 3

## 2023-04-12 MED ORDER — IOHEXOL 350 MG/ML SOLN
100.0000 mL | Freq: Once | INTRAVENOUS | Status: AC | PRN
  Administered 2023-04-12: 100 mL via INTRAVENOUS

## 2023-04-12 MED ORDER — ALBUTEROL SULFATE HFA 108 (90 BASE) MCG/ACT IN AERS: 2.0000 | INHALATION_SPRAY | Freq: Four times a day (QID) | RESPIRATORY_TRACT | Status: DC | PRN

## 2023-04-12 MED ORDER — CHLORHEXIDINE GLUCONATE CLOTH 2 % EX PADS
6.0000 | MEDICATED_PAD | Freq: Every day | CUTANEOUS | Status: DC
  Administered 2023-04-12 – 2023-04-14 (×2): 6 via TOPICAL

## 2023-04-12 MED ORDER — SODIUM CHLORIDE 0.9 % IV SOLN
500.0000 mg | INTRAVENOUS | Status: AC
  Administered 2023-04-12 – 2023-04-16 (×5): 500 mg via INTRAVENOUS
  Filled 2023-04-12 (×5): qty 5

## 2023-04-12 MED ORDER — FUROSEMIDE 40 MG PO TABS
40.0000 mg | ORAL_TABLET | Freq: Every day | ORAL | Status: DC
  Administered 2023-04-13: 40 mg via ORAL
  Filled 2023-04-12: qty 1

## 2023-04-12 MED ORDER — INSULIN ASPART 100 UNIT/ML IJ SOLN
0.0000 [IU] | Freq: Three times a day (TID) | INTRAMUSCULAR | Status: DC
  Administered 2023-04-12: 3 [IU] via SUBCUTANEOUS
  Administered 2023-04-12 – 2023-04-15 (×6): 2 [IU] via SUBCUTANEOUS
  Administered 2023-04-16: 3 [IU] via SUBCUTANEOUS
  Administered 2023-04-16: 5 [IU] via SUBCUTANEOUS
  Administered 2023-04-16 – 2023-04-17 (×2): 3 [IU] via SUBCUTANEOUS
  Administered 2023-04-17: 5 [IU] via SUBCUTANEOUS
  Filled 2023-04-12: qty 1

## 2023-04-12 MED ORDER — SODIUM CHLORIDE 0.9 % IV SOLN
2.0000 g | INTRAVENOUS | Status: AC
  Administered 2023-04-12 – 2023-04-16 (×5): 2 g via INTRAVENOUS
  Filled 2023-04-12 (×5): qty 20

## 2023-04-12 MED ORDER — ONDANSETRON HCL 4 MG PO TABS: 4.0000 mg | ORAL_TABLET | Freq: Four times a day (QID) | ORAL | Status: DC | PRN

## 2023-04-12 MED ORDER — ORAL CARE MOUTH RINSE: 15.0000 mL | OROMUCOSAL | Status: DC | PRN

## 2023-04-12 MED ORDER — ONDANSETRON HCL 4 MG/2ML IJ SOLN: 4.0000 mg | Freq: Four times a day (QID) | INTRAMUSCULAR | Status: DC | PRN

## 2023-04-12 MED ORDER — ACETAMINOPHEN 325 MG PO TABS: 650.0000 mg | ORAL_TABLET | Freq: Four times a day (QID) | ORAL | Status: DC | PRN

## 2023-04-12 MED ORDER — LACTATED RINGERS IV SOLN: INTRAVENOUS | Status: DC

## 2023-04-12 MED ORDER — DM-GUAIFENESIN ER 30-600 MG PO TB12
1.0000 | ORAL_TABLET | Freq: Two times a day (BID) | ORAL | Status: DC
  Administered 2023-04-12 – 2023-04-17 (×11): 1 via ORAL
  Filled 2023-04-12 (×11): qty 1

## 2023-04-12 NOTE — Sepsis Progress Note (Signed)
Elink monitoring for the code sepsis protocol.  

## 2023-04-12 NOTE — ED Provider Notes (Signed)
[place] EMERGENCY DEPARTMENT AT [facility] Provider Note   CSN: [id number] Arrival date & time: 04/12/23  0006     History  Chief Complaint  Patient presents with   Shortness of Breath    Jason Meyer is a 62 y.o. male.  62 year old male who presents ER today secondary to cough and congestion.  Also associated shortness of breath.  States been going on for few days but seem to got worse tonight.  Does report history of CHF.  Compliant with medications.  Had some chills but no measured temperatures at home.   Shortness of Breath      Home Medications Prior to Admission medications   Medication Sig Start Date End Date Taking? Authorizing Provider  albuterol (PROVENTIL) (2.5 MG/3ML) 0.083% nebulizer solution Take 3 mLs (2.5 mg total) by nebulization every 6 (six) hours as needed for wheezing or shortness of breath. 12/26/22   [name], MD  albuterol (VENTOLIN HFA) 108 (90 Base) MCG/ACT inhaler Inhale 2 puffs into the lungs every 6 (six) hours as needed for wheezing or shortness of breath. 12/26/22   [name], MD  Alirocumab (PRALUENT) 150 MG/ML SOAJ Inject 150 mg into the skin every 14 (fourteen) days. 02/06/23   [name], MD  amLODipine (NORVASC) 10 MG tablet Take 1 tablet (10 mg total) by mouth daily. 03/29/22   Dettinger, Elige Radon, MD  azelastine (ASTELIN) 0.1 % nasal spray Place 1 spray into both nostrils 2 (two) times daily. Use in each nostril as directed 11/17/21   Dettinger, Elige Radon, MD  bisoprolol (ZEBETA) 10 MG tablet TAKE 2 TABLETS BY MOUTH EVERY DAY 01/06/23   [name], MD  BREO ELLIPTA 100-25 MCG/ACT AEPB INHALE 1 PUFF INTO THE LUNGS EVERY DAY 04/05/23   [name], MD  celecoxib (CELEBREX) 200 MG capsule Take 1 capsule (200 mg total) by mouth 2 (two) times daily. Replaces meloxicam 02/06/23   [name], MD  cephALEXin (KEFLEX) 500 MG capsule Take 1 capsule (500 mg total) by mouth 3 (three) times daily. Should be  safe with penicillin allergy but small chance of allergy- call if it happens 12/29/22   [name], MD  clotrimazole-betamethasone (LOTRISONE) cream APPLY TO AFFECTED AREA TWICE A DAY 09/18/22   [name], MD  fluticasone [facility]) 50 MCG/ACT nasal spray Place 2 sprays into both nostrils daily. 04/02/17   Dettinger, Elige Radon, MD  furosemide (LASIX) 40 MG tablet Take 1 tablet (40 mg total) by mouth daily. Take extra 40 mg for 3 pound weight gain in 24 hours, or swelling or shortness of breath 10/19/22   [name], MD  hydrALAZINE (APRESOLINE) 50 MG tablet TAKE 1 TABLET BY MOUTH TWICE A DAY 01/06/23   [name], MD  metFORMIN (GLUCOPHAGE) 1000 MG tablet TAKE 1 TABLET (1,000 MG TOTAL) BY MOUTH TWICE A DAY WITH FOOD 03/22/23   [name], MD  olmesartan (BENICAR) 40 MG tablet Take 1 tablet (40 mg total) by mouth daily. 10/19/22 10/19/23  [name], MD  potassium chloride (MICRO-K) 10 MEQ CR capsule TAKE 1 CAPSULE BY MOUTH EVERY DAY 03/27/23   [name], MD  rosuvastatin (CRESTOR) 40 MG tablet Take 1 tablet (40 mg total) by mouth daily. Replaces atorvastatin which failed to reach Low Density Lipoprotein (LDL cholesterol) goal of 55, take only if repatha not approved/available. 02/06/23   [name], MD  sildenafil (REVATIO) 20 MG tablet 2-5  pills once daily as needed for erectile dysfunction 07/07/19   Dettinger, Elige Radon, MD  tirzepatide [facility]) 7.5 MG/0.5ML Pen Inject 7.5 mg into the skin once a week. Replaces 5 mg dose. 02/06/23   [name], MD      Allergies    Penicillins    Review of Systems   Review of Systems  Respiratory:  Positive for shortness of breath.     Physical Exam Updated Vital Signs BP 98/62   Pulse 87   Temp (!) 101.2 F (38.4 C) (Oral)   Resp (!) 22   Ht 5\' 11"  (1.803 m)   Wt (!) 169.6 kg   SpO2 95%   BMI 52.16 kg/m  Physical Exam Vitals and nursing note reviewed.  Constitutional:      Appearance: He is  well-developed.  HENT:     Head: Normocephalic and atraumatic.  Cardiovascular:     Rate and Rhythm: Normal rate.  Pulmonary:     Effort: Pulmonary effort is normal. Tachypnea present. No respiratory distress.     Breath sounds: Decreased breath sounds and rhonchi present.  Chest:     Chest wall: No mass.  Abdominal:     General: There is no distension.  Musculoskeletal:        General: Normal range of motion.     Cervical back: Normal range of motion.     Right lower leg: Edema present.     Left lower leg: Edema present.  Neurological:     General: No focal deficit present.     Mental Status: He is alert.     ED Results / Procedures / Treatments   Labs (all labs ordered are listed, but only abnormal results are displayed) Labs Reviewed  COMPREHENSIVE METABOLIC PANEL - Abnormal; Notable for the following components:      Result Value   Glucose, Bld 187 (*)    Creatinine, Ser 1.64 (*)    ALT 59 (*)    GFR, Estimated 48 (*)    All other components within normal limits  CBC WITH DIFFERENTIAL/PLATELET - Abnormal; Notable for the following components:   WBC 20.4 (*)    RBC 6.21 (*)    MCV 77.5 (*)    MCH 24.5 (*)    RDW 18.2 (*)    Neutro Abs 18.4 (*)    Abs Immature Granulocytes 0.14 (*)    All other components within normal limits  RESP PANEL BY RT-PCR (RSV, FLU A&B, COVID)  RVPGX2  CULTURE, BLOOD (ROUTINE X 2)  CULTURE, BLOOD (ROUTINE X 2)  LACTIC ACID, PLASMA  LACTIC ACID, PLASMA  PROTIME-INR  APTT  URINALYSIS, W/ REFLEX TO CULTURE (INFECTION SUSPECTED)  HIV ANTIBODY (ROUTINE TESTING W REFLEX)  MAGNESIUM  PHOSPHORUS    EKG None  Radiology CT Angio Chest PE W and/or Wo Contrast  Result Date: 04/12/2023 CLINICAL DATA:  Cough fever EXAM: CT ANGIOGRAPHY CHEST WITH CONTRAST TECHNIQUE: Multidetector CT imaging of the chest was performed using the standard protocol during bolus administration of intravenous contrast. Multiplanar CT image reconstructions and MIPs  were obtained to evaluate the vascular anatomy. RADIATION DOSE REDUCTION: This exam was performed according to the departmental dose-optimization program which includes automated exposure control, adjustment of the mA and/or kV according to patient size and/or use of iterative reconstruction technique. CONTRAST:  OMNIPAQUE IOHEXOL 350 MG/ML SOLN COMPARISON:  Chest xray 04/12/2023, CT chest 07/30/2022 FINDINGS: Cardiovascular: Satisfactory opacification of the pulmonary arteries to the segmental level. No evidence of pulmonary embolism.  Normal heart size. No pericardial effusion.Nonaneurysmal aorta. Mediastinum/Nodes: Stable to slight increased mediastinal adenopathy for example subcarinal node measures 2.2 cm compared with 1.8 cm previously. Thyroid gland, trachea, and esophagus demonstrate no significant findings. Lungs/Pleura: Heterogenous consolidations in the left greater than right lower lobes. No pleural effusion. No pneumothorax Upper Abdomen: No acute abnormality. Musculoskeletal: No chest wall abnormality. No acute or significant osseous findings. Review of the MIP images confirms the above findings. IMPRESSION: 1. Negative for acute pulmonary embolus. 2. Heterogenous consolidations in the left greater than right lower lobes consistent with pneumonia. 3. Stable to slight increased mediastinal adenopathy, likely reactive. Electronically Signed   By: Jasmine Pang M.D.   On: 04/12/2023 02:54   DG Chest Port 1 View  Result Date: 04/12/2023 CLINICAL DATA:  Cough, chills, fever, congestion. EXAM: PORTABLE CHEST 1 VIEW COMPARISON:  Chest radiographs and CT 07/30/2022 FINDINGS: Stable cardiomediastinal silhouette. Bibasilar atelectasis or infiltrates. No pleural effusion or pneumothorax. No displaced rib fractures. IMPRESSION: Bibasilar atelectasis or infiltrates. Electronically Signed   By: Minerva Fester M.D.   On: 04/12/2023 00:51    Procedures Procedures    Medications Ordered in ED Medications   lactated ringers infusion ( Intravenous New Bag/Given 04/12/23 0106)  cefTRIAXone (ROCEPHIN) 2 g in sodium chloride 0.9 % 100 mL IVPB (0 g Intravenous Stopped 04/12/23 0124)  azithromycin (ZITHROMAX) 500 mg in sodium chloride 0.9 % 250 mL IVPB (0 mg Intravenous Stopped 04/12/23 0212)  enoxaparin (LOVENOX) injection 40 mg (has no administration in time range)  acetaminophen (TYLENOL) tablet 650 mg (has no administration in time range)    Or  acetaminophen (TYLENOL) suppository 650 mg (has no administration in time range)  ondansetron (ZOFRAN) tablet 4 mg (has no administration in time range)    Or  ondansetron (ZOFRAN) injection 4 mg (has no administration in time range)  iohexol (OMNIPAQUE) 350 MG/ML injection 100 mL (100 mLs Intravenous Contrast Given 04/12/23 0228)    ED Course/ Medical Decision Making/ A&P                             Medical Decision Making Amount and/or Complexity of Data Reviewed Labs: ordered. Radiology: ordered. ECG/medicine tests: ordered.  Risk Prescription drug management. Decision regarding hospitalization.   Sepsis protocol initiated on arrival.  Secondary to his hypoxia and cough presumed pneumonia so Rocephin azithromycin given.  This was ultimately confirmed on chest x-ray and CT scan.  Was persistently hypoxic on room air and maintained at 4 L.  Will admit to hospitalist for further management.   Final Clinical Impression(s) / ED Diagnoses Final diagnoses:  Community acquired pneumonia, unspecified laterality  Acute respiratory failure with hypoxia [facility])    Rx / DC Orders ED Discharge Orders     None         Fae Blossom, Barbara Cower, MD 04/12/23 [id number]

## 2023-04-12 NOTE — Progress Notes (Addendum)
Patient seen and evaluated, chart reviewed, please see EMR for updated orders. Please see full H&P dictated by admitting physician Dr. Thomes Dinning for same date of service.   Brief Summary:-  62 y.o. male with medical history significant of DM2, HTN, obesity, obstructive sleep apnea admitted on 04/12/2023 with sepsis secondary to community-acquired pneumonia  A/p 1) sepsis secondary to CAP and possible UTI----POA -UA suspicious for UTI urine culture pending -Continue Rocephin and azithromycin, bronchodilators and mucolytics as ordered -  2)OSA--- BiPAP nightly advised  3)DM2- Use Novolog/Humalog Sliding scale insulin with Accu-Cheks/Fingersticks as ordered   4)H/o HTN--- hypotension noted -Serum a.m. cortisol is not low -Hold amlodipine, hold olmesartan and hydralazine  5)COPD--continue bronchodilators -Hold off on steroids  Morbid Obesity- -Low calorie diet, portion control and increase physical activity discussed with patient -Body mass index is 51.87 kg/m.  Patient seen and evaluated, chart reviewed, please see EMR for updated orders. Please see full H&P dictated by admitting physician Dr. Thomes Dinning for same date of service.  - Total care time 53 minutes  Shon Hale, MD

## 2023-04-12 NOTE — TOC Progression Note (Signed)
  Transition of Care [facility]) Screening Note   Patient Details  Name: Jason Meyer Date of Birth: 27-Mar-1961   Transition of Care [facility]) CM/SW Contact:    Elliot Gault, LCSW Phone Number: 04/12/2023, 9:53 AM    Transition of Care Department [facility]) has reviewed patient and no TOC needs have been identified at this time. We will continue to monitor patient advancement through interdisciplinary progression rounds. If new patient transition needs arise, please place a TOC consult.

## 2023-04-12 NOTE — ED Triage Notes (Signed)
Pt c/o cough, chills, fever, and congestion since Tuesday. Pt states he took a COVID test at home but it was negative. Pts sats 80% on RA on arrival to treatment room.

## 2023-04-12 NOTE — Plan of Care (Signed)

## 2023-04-12 NOTE — ED Notes (Addendum)
Patient returned from CT.  Pt's oxygen saturation was 80% & he was diaphoretic. Pt stated he helped move himself for CT. Pt's oxygen saturation improved to 91% after sitting still on stretcher.  EDP made aware.

## 2023-04-12 NOTE — ED Notes (Signed)
No urine at this time 

## 2023-04-12 NOTE — ED Notes (Signed)
Report attempted unable to take at this time

## 2023-04-12 NOTE — H&P (Addendum)
History and Physical    Patient: Jason Meyer ZOX:[id number] DOB: 10-11-1961 DOA: 04/12/2023 DOS: the patient was seen and examined on 04/12/2023 PCP: [name], MD  Patient coming from: Home  Chief Complaint:  Chief Complaint  Patient presents with   Shortness of Breath   HPI: Jason Meyer is a 62 y.o. male with medical history significant of hypertension, diabetes mellitus, obesity, obstructive sleep apnea who presents to the emergency department due to several days of onset of chest congestion, cough, chills, fever which is now associated with worsening shortness of breath.  Patient states that due to a home COVID test which was negative.  He denies able to the ED for further evaluation and management.  ED Course:  In the emergency department, patient was reported to ORIF with an O2 sat of 80% on room air.  Supplemental oxygen at 4 LPM was provided with improved O2 sats to 90 to 91%.  He was febrile, tachypneic and tachycardic, BP was 152/91.  Workup in the ED showed WBC 20.4, hemoglobin 15.2, hematocrit 40.4, MCV 77.5, platelets 216.  BMP was normal except for blood glucose of 187 and creatinine of 1.64 (baseline creatinine at 1.0-1.2).  Lactic acid was normal.  Influenza A, B, SARS coronavirus 2, RSV was negative.  Blood culture was pending Chest x-ray showed bibasilar atelectasis or infiltrates CT angiography chest with contrast was negative for acute pulmonary embolus.  Heterogeneous consolidations in the left greater than right lower lobes consistent with pneumonia.  Stable to slightly increased mediastinal adenopathy likely reactive. Patient was empirically started on IV ceftriaxone and azithromycin, IV hydration was provided  Review of Systems: Review of systems as noted in the HPI. All other systems reviewed and are negative.   Past Medical History:  Diagnosis Date   Allergy    Arthritis    Chronic diastolic heart failure (HCC) 08/11/2022   Admitted with acute  hypoxemic respiratory failure in the setting of acute diastolic CHF exacerbation      Coronary artery calcification 10/19/2022   Noted on CTA  which also noted Cardiac enlargement, bilateral pleural effusions and diffuse ground-glass attenuation suggestive of mild CHF. 07/2022 referral to cardiology   Hyperlipidemia    Hypertension    OSA (obstructive sleep apnea) 10/19/2022   Sleep apnea    Type 2 diabetes mellitus (HCC)    Past Surgical History:  Procedure Laterality Date   NO PAST SURGERIES      Social History:  reports that he quit smoking about 15 years ago. His smoking use included cigarettes. He has a 33.00 pack-year smoking history. He has never used smokeless tobacco. He reports that he does not currently use alcohol. He reports that he does not use drugs.   Allergies  Allergen Reactions   Penicillins Swelling    Family History  Problem Relation Age of Onset   Alzheimer's disease Mother    Hypertension Father    Heart disease Father    Heart attack Father    Stroke Father    Heart attack Brother      Prior to Admission medications   Medication Sig Start Date End Date Taking? Authorizing Provider  albuterol (PROVENTIL) (2.5 MG/3ML) 0.083% nebulizer solution Take 3 mLs (2.5 mg total) by nebulization every 6 (six) hours as needed for wheezing or shortness of breath. 12/26/22  Yes [name], MD  albuterol (VENTOLIN HFA) 108 (90 Base) MCG/ACT inhaler Inhale 2 puffs into the lungs every 6 (six) hours as needed for  wheezing or shortness of breath. 12/26/22  Yes [name], MD  Alirocumab (PRALUENT) 150 MG/ML SOAJ Inject 150 mg into the skin every 14 (fourteen) days. 02/06/23  Yes [name], MD  amLODipine (NORVASC) 10 MG tablet Take 1 tablet (10 mg total) by mouth daily. 03/29/22  Yes Dettinger, Elige Radon, MD  bisoprolol (ZEBETA) 10 MG tablet TAKE 2 TABLETS BY MOUTH EVERY DAY 01/06/23  Yes [name], MD  BREO ELLIPTA 100-25 MCG/ACT AEPB INHALE 1 PUFF  INTO THE LUNGS EVERY DAY 04/05/23  Yes [name], MD  buPROPion (WELLBUTRIN XL) 150 MG 24 hr tablet Take 150 mg by mouth every morning. 03/30/23  Yes [provider]  celecoxib (CELEBREX) 200 MG capsule Take 1 capsule (200 mg total) by mouth 2 (two) times daily. Replaces meloxicam 02/06/23  Yes [name], MD  fluticasone [facility]) 50 MCG/ACT nasal spray Place 2 sprays into both nostrils daily. 04/02/17  Yes Dettinger, Elige Radon, MD  furosemide (LASIX) 40 MG tablet Take 1 tablet (40 mg total) by mouth daily. Take extra 40 mg for 3 pound weight gain in 24 hours, or swelling or shortness of breath 10/19/22  Yes [name], MD  metFORMIN (GLUCOPHAGE) 1000 MG tablet TAKE 1 TABLET (1,000 MG TOTAL) BY MOUTH TWICE A DAY WITH FOOD 03/22/23  Yes [name], MD  olmesartan (BENICAR) 40 MG tablet Take 1 tablet (40 mg total) by mouth daily. 10/19/22 10/19/23 Yes [name], MD  potassium chloride (MICRO-K) 10 MEQ CR capsule TAKE 1 CAPSULE BY MOUTH EVERY DAY 03/27/23  Yes [name], MD  rosuvastatin (CRESTOR) 40 MG tablet Take 1 tablet (40 mg total) by mouth daily. Replaces atorvastatin which failed to reach Low Density Lipoprotein (LDL cholesterol) goal of 55, take only if repatha not approved/available. 02/06/23  Yes [name], MD  tirzepatide [facility]) 7.5 MG/0.5ML Pen Inject 7.5 mg into the skin once a week. Replaces 5 mg dose. 02/06/23  Yes [name], MD  cephALEXin (KEFLEX) 500 MG capsule Take 1 capsule (500 mg total) by mouth 3 (three) times daily. Should be safe with penicillin allergy but small chance of allergy- call if it happens Patient not taking: Reported on 04/12/2023 12/29/22   [name], MD  clotrimazole-betamethasone (LOTRISONE) cream APPLY TO AFFECTED AREA TWICE A DAY Patient not taking: Reported on 04/12/2023 09/18/22   [name], MD  hydrALAZINE (APRESOLINE) 50 MG tablet TAKE 1 TABLET BY MOUTH TWICE A DAY Patient not taking: Reported on  04/12/2023 01/06/23   [name], MD  sildenafil (REVATIO) 20 MG tablet 2-5 pills once daily as needed for erectile dysfunction Patient not taking: Reported on 04/12/2023 07/07/19   Dettinger, Elige Radon, MD    Physical Exam: BP (!) 106/59   Pulse 84   Temp (!) 101.2 F (38.4 C) (Oral)   Resp (!) 24   Ht 5\' 11"  (1.803 m)   Wt (!) 169.6 kg   SpO2 96%   BMI 52.16 kg/m   General: 62 y.o. year-old male well developed well nourished in no acute distress.  Alert and oriented x3. HEENT: NCAT, EOMI Neck: Supple, trachea medial Cardiovascular: Regular rate and rhythm with no rubs or gallops.  No thyromegaly or JVD noted.  +2 lower extremity edema bilaterally. 2/4 pulses in all 4 extremities. Respiratory: Tachypnea.  Diffuse rhonchi with decreased breath sounds.  Scattered wheezes on auscultation Abdomen: Soft, nontender nondistended with normal bowel sounds x4 quadrants.  Muskuloskeletal: No cyanosis, clubbing or edema noted bilaterally Neuro: CN II-XII intact, strength 5/5 x 4, sensation, reflexes intact Skin: No ulcerative lesions noted or rashes Psychiatry: Judgement and insight appear normal. Mood is appropriate for condition and setting          Labs on Admission:  Basic Metabolic Panel: Recent Labs  Lab 04/12/23 0034  NA 135  K 4.0  CL 102  CO2 23  GLUCOSE 187*  BUN 15  CREATININE 1.64*  CALCIUM 8.9   Liver Function Tests: Recent Labs  Lab 04/12/23 0034  AST 20  ALT 59*  ALKPHOS 120  BILITOT 1.1  PROT 8.0  ALBUMIN 3.6   No results for input(s): "LIPASE", "AMYLASE" in the last 168 hours. No results for input(s): "AMMONIA" in the last 168 hours. CBC: Recent Labs  Lab 04/12/23 0034  WBC 20.4*  NEUTROABS 18.4*  HGB 15.2  HCT 48.1  MCV 77.5*  PLT 216   Cardiac Enzymes: No results for input(s): "CKTOTAL", "CKMB", "CKMBINDEX", "TROPONINI" in the last 168 hours.  BNP (last 3 results) Recent Labs    07/30/22 1117  BNP 61.0    ProBNP (last 3 results) No  results for input(s): "PROBNP" in the last 8760 hours.  CBG: No results for input(s): "GLUCAP" in the last 168 hours.  Radiological Exams on Admission: CT Angio Chest PE W and/or Wo Contrast  Result Date: 04/12/2023 CLINICAL DATA:  Cough fever EXAM: CT ANGIOGRAPHY CHEST WITH CONTRAST TECHNIQUE: Multidetector CT imaging of the chest was performed using the standard protocol during bolus administration of intravenous contrast. Multiplanar CT image reconstructions and MIPs were obtained to evaluate the vascular anatomy. RADIATION DOSE REDUCTION: This exam was performed according to the departmental dose-optimization program which includes automated exposure control, adjustment of the mA and/or kV according to patient size and/or use of iterative reconstruction technique. CONTRAST:  OMNIPAQUE IOHEXOL 350 MG/ML SOLN COMPARISON:  Chest xray 04/12/2023, CT chest 07/30/2022 FINDINGS: Cardiovascular: Satisfactory opacification of the pulmonary arteries to the segmental level. No evidence of pulmonary embolism. Normal heart size. No pericardial effusion.Nonaneurysmal aorta. Mediastinum/Nodes: Stable to slight increased mediastinal adenopathy for example subcarinal node measures 2.2 cm compared with 1.8 cm previously. Thyroid gland, trachea, and esophagus demonstrate no significant findings. Lungs/Pleura: Heterogenous consolidations in the left greater than right lower lobes. No pleural effusion. No pneumothorax Upper Abdomen: No acute abnormality. Musculoskeletal: No chest wall abnormality. No acute or significant osseous findings. Review of the MIP images confirms the above findings. IMPRESSION: 1. Negative for acute pulmonary embolus. 2. Heterogenous consolidations in the left greater than right lower lobes consistent with pneumonia. 3. Stable to slight increased mediastinal adenopathy, likely reactive. Electronically Signed   By: Jasmine Pang M.D.   On: 04/12/2023 02:54   DG Chest Port 1 View  Result Date:  04/12/2023 CLINICAL DATA:  Cough, chills, fever, congestion. EXAM: PORTABLE CHEST 1 VIEW COMPARISON:  Chest radiographs and CT 07/30/2022 FINDINGS: Stable cardiomediastinal silhouette. Bibasilar atelectasis or infiltrates. No pleural effusion or pneumothorax. No displaced rib fractures. IMPRESSION: Bibasilar atelectasis or infiltrates. Electronically Signed   By: Minerva Fester M.D.   On: 04/12/2023 00:51    EKG: I independently viewed the EKG done and my findings are as followed: Normal sinus rhythm at a rate of 97 bpm  Assessment/Plan Present on Admission:  CAP (community acquired pneumonia)  Microcytic anemia  Essential hypertension  Mixed hyperlipidemia  Active Problems:   Essential hypertension   Morbid obesity with BMI of 50.0-59.9, adult (HCC)  Mixed hyperlipidemia   Microcytic anemia   CAP (community acquired pneumonia)   Sepsis (HCC)   Low mean corpuscular volume (MCV)   Type 2 diabetes mellitus with hyperglycemia (HCC)  Sepsis secondary to community-acquired pneumonia POA Acute respiratory failure with hypoxia Patient was started on ceftriaxone and azithromycin, we shall continue same at this time with plan to de-escalate/discontinue based on blood culture, sputum culture, urine Legionella, strep pneumo and procalcitonin Continue Tylenol as needed Continue Mucinex, incentive spirometry, flutter valve   Low MCV  MCV 77.5, iron studies will be done  Type 2 diabetes mellitus with hyperglycemia Continue ISS and hypoglycemia protocol  Essential hypertension BP meds will be held at this time due to soft BP  Mixed hyperlipidemia Continue statin  Morbid obesity (BMI 52.16) Diet and lifestyle modification Patient may need to follow-up with outpatient PCP for weight loss  Possible history of COPD Patient states that he has not been officially diagnosed to have COPD, but home meds include albuterol and Breo Ellipta Physical exam showed mild scattered wheezes Continue  albuterol and Breo Ellipta  DVT prophylaxis: Lovenox  Advance Care Planning:   Code Status: Full Code   Consults: None  Family Communication: Wife at bedside (all questions answered to satisfaction)  Severity of Illness: The appropriate patient status for this patient is INPATIENT. Inpatient status is judged to be reasonable and necessary in order to provide the required intensity of service to ensure the patient's safety. The patient's presenting symptoms, physical exam findings, and initial radiographic and laboratory data in the context of their chronic comorbidities is felt to place them at high risk for further clinical deterioration. Furthermore, it is not anticipated that the patient will be medically stable for discharge from the hospital within 2 midnights of admission.   * I certify that at the point of admission it is my clinical judgment that the patient will require inpatient hospital care spanning beyond 2 midnights from the point of admission due to high intensity of service, high risk for further deterioration and high frequency of surveillance required.*  Author: Frankey Shown, DO 04/12/2023 8:18 AM  For on call review [email or web address].

## 2023-04-12 NOTE — ED Notes (Signed)
Patient transported to CT 

## 2023-04-13 DIAGNOSIS — J189 Pneumonia, unspecified organism: Secondary | ICD-10-CM | POA: Diagnosis not present

## 2023-04-13 DIAGNOSIS — Z6841 Body Mass Index (BMI) 40.0 and over, adult: Secondary | ICD-10-CM | POA: Diagnosis not present

## 2023-04-13 DIAGNOSIS — I10 Essential (primary) hypertension: Secondary | ICD-10-CM | POA: Diagnosis not present

## 2023-04-13 LAB — BASIC METABOLIC PANEL
Anion gap: 10 (ref 5–15)
BUN: 15 mg/dL (ref 6–20)
CO2: 23 mmol/L (ref 22–32)
Calcium: 8.7 mg/dL — ABNORMAL LOW (ref 8.9–10.3)
Chloride: 103 mmol/L (ref 98–111)
Creatinine, Ser: 1.45 mg/dL — ABNORMAL HIGH (ref 0.61–1.24)
GFR, Estimated: 55 mL/min — ABNORMAL LOW (ref >60)
Glucose, Bld: 130 mg/dL — ABNORMAL HIGH (ref 70–99)
Potassium: 3.7 mmol/L (ref 3.5–5.1)
Sodium: 136 mmol/L (ref 135–145)

## 2023-04-13 LAB — CULTURE, BLOOD (ROUTINE X 2): Special Requests: ADEQUATE

## 2023-04-13 LAB — TROPONIN I (HIGH SENSITIVITY): Troponin I (High Sensitivity): 28 ng/L — ABNORMAL HIGH (ref <18)

## 2023-04-13 LAB — URINE CULTURE: Culture: NO GROWTH

## 2023-04-13 LAB — COMPREHENSIVE METABOLIC PANEL
ALT: 33 U/L (ref 0–44)
AST: 15 U/L (ref 15–41)
Albumin: 2.9 g/dL — ABNORMAL LOW (ref 3.5–5.0)
Alkaline Phosphatase: 90 U/L (ref 38–126)
Anion gap: 9 (ref 5–15)
BUN: 16 mg/dL (ref 6–20)
CO2: 23 mmol/L (ref 22–32)
Calcium: 8.3 mg/dL — ABNORMAL LOW (ref 8.9–10.3)
Chloride: 104 mmol/L (ref 98–111)
Creatinine, Ser: 1.45 mg/dL — ABNORMAL HIGH (ref 0.61–1.24)
GFR, Estimated: 55 mL/min — ABNORMAL LOW (ref >60)
Glucose, Bld: 134 mg/dL — ABNORMAL HIGH (ref 70–99)
Potassium: 3.8 mmol/L (ref 3.5–5.1)
Sodium: 136 mmol/L (ref 135–145)
Total Bilirubin: 0.9 mg/dL (ref 0.3–1.2)
Total Protein: 6.8 g/dL (ref 6.5–8.1)

## 2023-04-13 LAB — CBC
HCT: 42.8 % (ref 39.0–52.0)
Hemoglobin: 13.4 g/dL (ref 13.0–17.0)
MCH: 24.8 pg — ABNORMAL LOW (ref 26.0–34.0)
MCHC: 31.3 g/dL (ref 30.0–36.0)
MCV: 79.1 fL — ABNORMAL LOW (ref 80.0–100.0)
Platelets: 187 10*3/uL (ref 150–400)
RBC: 5.41 MIL/uL (ref 4.22–5.81)
RDW: 16.8 % — ABNORMAL HIGH (ref 11.5–15.5)
WBC: 15.8 10*3/uL — ABNORMAL HIGH (ref 4.0–10.5)
nRBC: 0 % (ref 0.0–0.2)

## 2023-04-13 LAB — GLUCOSE, CAPILLARY
Glucose-Capillary: 145 mg/dL — ABNORMAL HIGH (ref 70–99)
Glucose-Capillary: 137 mg/dL — ABNORMAL HIGH (ref 70–99)
Glucose-Capillary: 93 mg/dL (ref 70–99)
Glucose-Capillary: 105 mg/dL — ABNORMAL HIGH (ref 70–99)

## 2023-04-13 LAB — MAGNESIUM: Magnesium: 2.1 mg/dL (ref 1.7–2.4)

## 2023-04-13 NOTE — Progress Notes (Signed)
PROGRESS NOTE     Jason Meyer, is a 62 y.o. male, DOB - 26-Jul-1961, JXB:[id number]  Admit date - 04/12/2023   Admitting Physician Heydi Swango [name], MD  Outpatient Primary MD for the patient is [name], MD  LOS - 1  Chief Complaint  Patient presents with   Shortness of Breath        Brief Summary:-  62 y.o. male with medical history significant of DM2, HTN, obesity, obstructive sleep apnea admitted on 04/12/2023 with sepsis secondary to community-acquired pneumonia   A/p 1) sepsis secondary to CAP and possible UTI----POA -UA suspicious for UTI urine culture pending -Continue Rocephin and azithromycin, bronchodilators and mucolytics as ordered -04/13/23 -Cough dyspnea with exertion and hypoxia persist -Attempted to wean off O2 O2 sats dropped to 84% on room air -Placed back on 2 to 3 L of oxygen via nasal cannula at this time   2)OSA--- BiPAP nightly advised   3)DM2- Use Novolog/Humalog Sliding scale insulin with Accu-Cheks/Fingersticks as ordered    4)H/o HTN--- hypotension noted -Serum a.m. cortisol is not low -Hold amlodipine, hold olmesartan and hydralazine   5)COPD--continue bronchodilators -Hold off on steroids  6) acute hypoxic respiratory failure--- secondary to #1 above/pneumonia -Management as above in #1   Morbid Obesity- -Low calorie diet, portion control and increase physical activity discussed with patient -Body mass index is 51.87 kg/m.    Status is: Inpatient   Disposition: The patient is from: Home              Anticipated d/c is to: Home              Anticipated d/c date is: 2 days              Patient currently is not medically stable to d/c. Barriers: Not Clinically Stable-   Code Status :  -  Code Status: Full Code   Family Communication:    NA (patient is alert, awake and coherent)   DVT Prophylaxis  :   - SCDs *  SCDs Start: 04/12/23 0336   Lab Results  Component Value Date   PLT 187 04/13/2023    Inpatient  Medications  Scheduled Meds:  Chlorhexidine Gluconate Cloth  6 each Topical Q0600   dextromethorphan-guaiFENesin  1 tablet Oral BID   enoxaparin (LOVENOX) injection  80 mg Subcutaneous Q24H   fluticasone furoate-vilanterol  1 puff Inhalation Daily   furosemide  40 mg Oral Daily   insulin aspart  0-15 Units Subcutaneous TID WC   rosuvastatin  40 mg Oral Daily   Continuous Infusions:  sodium chloride 100 mL/hr at 04/13/23 1814   azithromycin 500 mg (04/13/23 0107)   cefTRIAXone (ROCEPHIN)  IV 2 g (04/13/23 0024)   PRN Meds:.acetaminophen **OR** acetaminophen, albuterol, ondansetron **OR** ondansetron (ZOFRAN) IV, mouth rinse   Anti-infectives (From admission, onward)    Start     Dose/Rate Route Frequency Ordered Stop   04/12/23 0030  cefTRIAXone (ROCEPHIN) 2 g in sodium chloride 0.9 % 100 mL IVPB        2 g 200 mL/hr over 30 Minutes Intravenous Every 24 hours 04/12/23 0027 04/17/23 0029   04/12/23 0030  azithromycin (ZITHROMAX) 500 mg in sodium chloride 0.9 % 250 mL IVPB        500 mg 250 mL/hr over 60 Minutes Intravenous Every 24 hours 04/12/23 0027 04/17/23 0029         Subjective: Jason Meyer today has no fevers, no emesis,  No chest pain,   -  Patient's son is at bedside -Questions answered -Cough dyspnea with exertion and hypoxia persist -Attempted to wean off O2 O2 sats dropped to 84% on room air -Placed back on 2 to 3 L of oxygen via nasal cannula at this time  Objective: Vitals:   04/13/23 0831 04/13/23 1052 04/13/23 1440 04/13/23 1943  BP:   129/75   Pulse:  93 85 92  Resp:   18   Temp:   99.4 F (37.4 C)   TempSrc:   Oral   SpO2: 95% 90% 92% 91%  Weight:      Height:        Intake/Output Summary (Last 24 hours) at 04/13/2023 1944 Last data filed at 04/13/2023 1843 Gross per 24 hour  Intake 3995.92 ml  Output 2251 ml  Net 1744.92 ml   Filed Weights   04/12/23 0023 04/12/23 1221 04/13/23 0010  Weight: (!) 169.6 kg (!) 168.7 kg (!) 172 kg     Physical Exam  Gen:- Awake Alert, dyspnea on exertion persist HEENT:- [place].AT, No sclera icterus Nose- [place] 3L/min Neck-Supple Neck,No JVD,.  Lungs-diminished breath sounds with bilateral rhonchi  CV- S1, S2 normal, regular  Abd-  +ve B.Sounds, Abd Soft, No tenderness, increased truncal adiposity Extremity/Skin:- No  edema, pedal pulses present  Psych-affect is appropriate, oriented x3 Neuro-no new focal deficits, no tremors  Data Reviewed: I have personally reviewed following labs and imaging studies  CBC: Recent Labs  Lab 04/12/23 0034 04/13/23 0502  WBC 20.4* 15.8*  NEUTROABS 18.4*  --   HGB 15.2 13.4  HCT 48.1 42.8  MCV 77.5* 79.1*  PLT 216 187   Basic Metabolic Panel: Recent Labs  Lab 04/12/23 0034 04/12/23 0219 04/13/23 0502  NA 135  --  136  K 4.0  --  3.8  CL 102  --  104  CO2 23  --  23  GLUCOSE 187*  --  134*  BUN 15  --  16  CREATININE 1.64*  --  1.45*  CALCIUM 8.9  --  8.3*  MG  --  1.7  --   PHOS  --  1.8*  --    GFR: Estimated Creatinine Clearance: 87.4 mL/min (A) (by C-G formula based on SCr of 1.45 mg/dL (H)). Liver Function Tests: Recent Labs  Lab 04/12/23 0034 04/13/23 0502  AST 20 15  ALT 59* 33  ALKPHOS 120 90  BILITOT 1.1 0.9  PROT 8.0 6.8  ALBUMIN 3.6 2.9*   HbA1C: Recent Labs    04/12/23 0219  HGBA1C 6.0*    Recent Results (from the past 240 hour(s))  Blood Culture (routine x 2)     Status: None (Preliminary result)   Collection Time: 04/12/23 12:34 AM   Specimen: BLOOD RIGHT FOREARM  Result Value Ref Range Status   Specimen Description BLOOD RIGHT FOREARM  Final   Special Requests   Final    BOTTLES DRAWN AEROBIC AND ANAEROBIC Blood Culture adequate volume   Culture   Final    NO GROWTH 1 DAY Performed at [facility], [address], [place], [place] [address]    Report Status PENDING  Incomplete  Resp panel by RT-PCR (RSV, Flu A&B, Covid) Anterior Nasal Swab     Status: None   Collection Time: 04/12/23 12:36 AM    Specimen: Anterior Nasal Swab  Result Value Ref Range Status   SARS Coronavirus 2 by RT PCR NEGATIVE NEGATIVE Final    Comment: (NOTE) SARS-CoV-2 target nucleic acids are NOT DETECTED.  The SARS-CoV-2 RNA is generally detectable in upper respiratory specimens during the acute phase of infection. The lowest concentration of SARS-CoV-2 viral copies this assay can detect is 138 copies/mL. A negative result does not preclude SARS-Cov-2 infection and should not be used as the sole basis for treatment or other patient management decisions. A negative result may occur with  improper specimen collection/handling, submission of specimen other than nasopharyngeal swab, presence of viral mutation(s) within the areas targeted by this assay, and inadequate number of viral copies(<138 copies/mL). A negative result must be combined with clinical observations, patient history, and epidemiological information. The expected result is Negative.  Fact Sheet for Patients:  BloggerCourse.com  Fact Sheet for Healthcare Providers:  SeriousBroker.it  This test is no t yet approved or cleared by the [place] FDA and  has been authorized for detection and/or diagnosis of SARS-CoV-2 by FDA under an Emergency Use Authorization (EUA). This EUA will remain  in effect (meaning this test can be used) for the duration of the COVID-19 declaration under Section 564(b)(1) of the Act, 21 U.S.C.section 360bbb-3(b)(1), unless the authorization is terminated  or revoked sooner.       Influenza A by PCR NEGATIVE NEGATIVE Final   Influenza B by PCR NEGATIVE NEGATIVE Final    Comment: (NOTE) The Xpert Xpress SARS-CoV-2/FLU/RSV plus assay is intended as an aid in the diagnosis of influenza from Nasopharyngeal swab specimens and should not be used as a sole basis for treatment. Nasal washings and aspirates are unacceptable for Xpert Xpress  SARS-CoV-2/FLU/RSV testing.  Fact Sheet for Patients: BloggerCourse.com  Fact Sheet for Healthcare Providers: SeriousBroker.it  This test is not yet approved or cleared by the [place] FDA and has been authorized for detection and/or diagnosis of SARS-CoV-2 by FDA under an Emergency Use Authorization (EUA). This EUA will remain in effect (meaning this test can be used) for the duration of the COVID-19 declaration under Section 564(b)(1) of the Act, 21 U.S.C. section 360bbb-3(b)(1), unless the authorization is terminated or revoked.     Resp Syncytial Virus by PCR NEGATIVE NEGATIVE Final    Comment: (NOTE) Fact Sheet for Patients: BloggerCourse.com  Fact Sheet for Healthcare Providers: SeriousBroker.it  This test is not yet approved or cleared by the [place] FDA and has been authorized for detection and/or diagnosis of SARS-CoV-2 by FDA under an Emergency Use Authorization (EUA). This EUA will remain in effect (meaning this test can be used) for the duration of the COVID-19 declaration under Section 564(b)(1) of the Act, 21 U.S.C. section 360bbb-3(b)(1), unless the authorization is terminated or revoked.  Performed at [facility], [address], [place], [place] [address]   Blood Culture (routine x 2)     Status: None (Preliminary result)   Collection Time: 04/12/23 12:50 AM   Specimen: BLOOD LEFT ARM  Result Value Ref Range Status   Specimen Description BLOOD LEFT ARM  Final   Special Requests   Final    BOTTLES DRAWN AEROBIC AND ANAEROBIC Blood Culture adequate volume   Culture   Final    NO GROWTH 1 DAY Performed at [facility], [address], [place], [place] [address]    Report Status PENDING  Incomplete  MRSA Next Gen by PCR, Nasal     Status: None   Collection Time: 04/12/23 12:16 PM   Specimen: Nasal Mucosa; Nasal Swab  Result Value Ref Range Status    MRSA by PCR Next Gen NOT DETECTED NOT DETECTED Final    Comment: (NOTE)  The GeneXpert MRSA Assay (FDA approved for NASAL specimens only), is one component of a comprehensive MRSA colonization surveillance program. It is not intended to diagnose MRSA infection nor to guide or monitor treatment for MRSA infections. Test performance is not FDA approved in patients less than 67 years old. Performed at [facility], [address], [place], [place] [address]   Urine Culture     Status: None   Collection Time: 04/12/23  1:25 PM   Specimen: Urine, Clean Catch  Result Value Ref Range Status   Specimen Description   Final    URINE, CLEAN CATCH Performed at [facility], [address], [place], [place] [address]    Special Requests   Final    NONE Reflexed from [id number] Performed at [facility], [address], [place], [place] [address]    Culture   Final    NO GROWTH Performed at [facility], [address], [place], [place] [address]    Report Status 04/13/2023 FINAL  Final      Radiology Studies: CT Angio Chest PE W and/or Wo Contrast  Result Date: 04/12/2023 CLINICAL DATA:  Cough fever EXAM: CT ANGIOGRAPHY CHEST WITH CONTRAST TECHNIQUE: Multidetector CT imaging of the chest was performed using the standard protocol during bolus administration of intravenous contrast. Multiplanar CT image reconstructions and MIPs were obtained to evaluate the vascular anatomy. RADIATION DOSE REDUCTION: This exam was performed according to the departmental dose-optimization program which includes automated exposure control, adjustment of the mA and/or kV according to patient size and/or use of iterative reconstruction technique. CONTRAST:  OMNIPAQUE IOHEXOL 350 MG/ML SOLN COMPARISON:  Chest xray 04/12/2023, CT chest 07/30/2022 FINDINGS: Cardiovascular: Satisfactory opacification of the pulmonary arteries to the segmental level. No evidence of pulmonary embolism. Normal heart size. No  pericardial effusion.Nonaneurysmal aorta. Mediastinum/Nodes: Stable to slight increased mediastinal adenopathy for example subcarinal node measures 2.2 cm compared with 1.8 cm previously. Thyroid gland, trachea, and esophagus demonstrate no significant findings. Lungs/Pleura: Heterogenous consolidations in the left greater than right lower lobes. No pleural effusion. No pneumothorax Upper Abdomen: No acute abnormality. Musculoskeletal: No chest wall abnormality. No acute or significant osseous findings. Review of the MIP images confirms the above findings. IMPRESSION: 1. Negative for acute pulmonary embolus. 2. Heterogenous consolidations in the left greater than right lower lobes consistent with pneumonia. 3. Stable to slight increased mediastinal adenopathy, likely reactive. Electronically Signed   By: Jasmine Pang M.D.   On: 04/12/2023 02:54   DG Chest Port 1 View  Result Date: 04/12/2023 CLINICAL DATA:  Cough, chills, fever, congestion. EXAM: PORTABLE CHEST 1 VIEW COMPARISON:  Chest radiographs and CT 07/30/2022 FINDINGS: Stable cardiomediastinal silhouette. Bibasilar atelectasis or infiltrates. No pleural effusion or pneumothorax. No displaced rib fractures. IMPRESSION: Bibasilar atelectasis or infiltrates. Electronically Signed   By: Minerva Fester M.D.   On: 04/12/2023 00:51     Scheduled Meds:  Chlorhexidine Gluconate Cloth  6 each Topical Q0600   dextromethorphan-guaiFENesin  1 tablet Oral BID   enoxaparin (LOVENOX) injection  80 mg Subcutaneous Q24H   fluticasone furoate-vilanterol  1 puff Inhalation Daily   furosemide  40 mg Oral Daily   insulin aspart  0-15 Units Subcutaneous TID WC   rosuvastatin  40 mg Oral Daily   Continuous Infusions:  sodium chloride 100 mL/hr at 04/13/23 1814   azithromycin 500 mg (04/13/23 0107)   cefTRIAXone (ROCEPHIN)  IV 2 g (04/13/23 0024)     LOS: 1 day  Shon Hale M.D on 04/13/2023 at 7:44 PM  Go to [email or web address] - for contact info  [facility] - Office  [phone]  If 7PM-7AM, please contact night-coverage [email or web address] 04/13/2023, 7:44 PM

## 2023-04-14 DIAGNOSIS — Z6841 Body Mass Index (BMI) 40.0 and over, adult: Secondary | ICD-10-CM | POA: Diagnosis not present

## 2023-04-14 DIAGNOSIS — J189 Pneumonia, unspecified organism: Secondary | ICD-10-CM | POA: Diagnosis not present

## 2023-04-14 DIAGNOSIS — I10 Essential (primary) hypertension: Secondary | ICD-10-CM | POA: Diagnosis not present

## 2023-04-14 LAB — BASIC METABOLIC PANEL
Anion gap: 9 (ref 5–15)
BUN: 15 mg/dL (ref 6–20)
CO2: 21 mmol/L — ABNORMAL LOW (ref 22–32)
Calcium: 8.6 mg/dL — ABNORMAL LOW (ref 8.9–10.3)
Chloride: 106 mmol/L (ref 98–111)
Creatinine, Ser: 1.28 mg/dL — ABNORMAL HIGH (ref 0.61–1.24)
GFR, Estimated: >60 mL/min (ref >60)
Glucose, Bld: 143 mg/dL — ABNORMAL HIGH (ref 70–99)
Potassium: 3.8 mmol/L (ref 3.5–5.1)
Sodium: 136 mmol/L (ref 135–145)

## 2023-04-14 LAB — GLUCOSE, CAPILLARY
Glucose-Capillary: 124 mg/dL — ABNORMAL HIGH (ref 70–99)
Glucose-Capillary: 113 mg/dL — ABNORMAL HIGH (ref 70–99)
Glucose-Capillary: 122 mg/dL — ABNORMAL HIGH (ref 70–99)
Glucose-Capillary: 110 mg/dL — ABNORMAL HIGH (ref 70–99)

## 2023-04-14 LAB — CBC
HCT: 43.7 % (ref 39.0–52.0)
Hemoglobin: 13.8 g/dL (ref 13.0–17.0)
MCH: 24.6 pg — ABNORMAL LOW (ref 26.0–34.0)
MCHC: 31.6 g/dL (ref 30.0–36.0)
MCV: 77.8 fL — ABNORMAL LOW (ref 80.0–100.0)
Platelets: 202 10*3/uL (ref 150–400)
RBC: 5.62 MIL/uL (ref 4.22–5.81)
RDW: 16.6 % — ABNORMAL HIGH (ref 11.5–15.5)
WBC: 13.7 10*3/uL — ABNORMAL HIGH (ref 4.0–10.5)
nRBC: 0 % (ref 0.0–0.2)

## 2023-04-14 LAB — MAGNESIUM: Magnesium: 2.1 mg/dL (ref 1.7–2.4)

## 2023-04-14 LAB — TROPONIN I (HIGH SENSITIVITY): Troponin I (High Sensitivity): 25 ng/L — ABNORMAL HIGH (ref <18)

## 2023-04-14 LAB — CULTURE, BLOOD (ROUTINE X 2): Special Requests: ADEQUATE

## 2023-04-14 MED ORDER — FUROSEMIDE 40 MG PO TABS
40.0000 mg | ORAL_TABLET | Freq: Every day | ORAL | Status: DC
  Administered 2023-04-15 – 2023-04-17 (×3): 40 mg via ORAL
  Filled 2023-04-14 (×3): qty 1

## 2023-04-14 NOTE — Progress Notes (Signed)
Tele called and said pt. Was in v-tach. Went to assess pt. Charge nurse in room with pt., another nurse in room also with pt. Upon arrival Pt. Alert and Sitting in chair, watching tv. Pt. Denies SOB, chest pain, or any other symptoms. Patient assisted into bed with the help of charge nurse and another nurse. AC arrived to room. Charge nurse, [facility] and nurse obtained EKG per order. EKG showed stemi. Vitals obtained: BP 153/96 HR: 92 RR 20 Temp: 99.8 oral, O2: 97 [place] on 3 liters, Pain scale 0-10, score of 0. MD called. Rapid response called. MD came to bedside. MD ordered labs. Per MD, monitor for now, since pt. Is asymptomatic.   At approx. 12:30am: Pt. Wanted to get back into chair, pt. assisted to chair. Pt. Still denies any chest pain, SOB, or any other symptoms. Pt. Watching tv. Currently.   At approx. 1:30am: pt.still sitting in chair. Pt has eyes closed resting. Still no complaints or concerns verbalized. HR 87 on tele box.   Approx. 2:00am: pt. Sitting in chair with eyes closed resting. HR on tele box 85.  Approx. 4:12am: pt. Sitting in chair with eyes closed resting. Pt. Heard nurse enter room and opened eyes. Asked pt. If having any pain, SOB, or any concerns, pt. Denies any symptoms or concerns.   Approx. 5:20am: Pt sitting in chair with eyes open, watching tv. Pt. States he slept some. Pt. Denies any pain. SOB, or concerns. Will continue to monitor and report off to oncoming shift.

## 2023-04-14 NOTE — Progress Notes (Addendum)
PROGRESS NOTE     Jason Meyer, is a 62 y.o. male, DOB - [birth year], JYN:[id number]  Admit date - 04/12/2023   Admitting Physician [name] [name], MD  Outpatient Primary MD for the patient is Jason Olszewski, MD  LOS - 2  Chief Complaint  Patient presents with   Shortness of Breath        Brief Summary:-  62 y.o. male with medical history significant of DM2, HTN, obesity, obstructive sleep apnea admitted on 04/12/2023 with sepsis secondary to community-acquired pneumonia   A/p 1) sepsis secondary to CAP and possible UTI----POA -UA suspicious for UTI urine culture pending -Continue Rocephin and azithromycin, bronchodilators and mucolytics as ordered -04/14/23 -Cough dyspnea with exertion and hypoxia persist -WBC trending down   2) arrhythmia concerns--Concerns about nonsustained V. tach overnight--please see documentation from LPN Ms. Deetta Perla -Patient denies chest pains, or  palpitations or dizziness -Some dyspnea on exertion -EKG sinus rhythm largely unchanged from prior Troponin 28 >> 25   3)DM2- Use Novolog/Humalog Sliding scale insulin with Accu-Cheks/Fingersticks as ordered    4)H/o HTN--- hypotension noted -Serum a.m. cortisol is not low -Hold amlodipine, hold olmesartan and hydralazine   5)COPD--continue bronchodilators -Hold off on steroids  6) acute hypoxic respiratory failure--- secondary to #1 above/pneumonia -Management as above in #1  7)AKI----acute kidney injury   -Baseline creatinine 1.0 to1.2 -Admission creatinine 1.64 -Creatinine down to 1.28 --renally adjust medications, avoid nephrotoxic agents / dehydration  / hypotension   OSA--- BiPAP nightly advised   Morbid Obesity- -Low calorie diet, portion control and increase physical activity discussed with patient -Body mass index is 51.87 kg/m.    Status is: Inpatient   Disposition: The patient is from: Home              Anticipated d/c is to: Home              Anticipated d/c  date is: 1 day              Patient currently is not medically stable to d/c. Barriers: Not Clinically Stable-   Code Status :  -  Code Status: Full Code   Family Communication:    (patient is alert, awake and coherent)  Discussed with son at bedside  DVT Prophylaxis  :   - SCDs  SCDs Start: 04/12/23 0336   Lab Results  Component Value Date   PLT 202 04/14/2023   Inpatient Medications  Scheduled Meds:  Chlorhexidine Gluconate Cloth  6 each Topical Q0600   dextromethorphan-guaiFENesin  1 tablet Oral BID   enoxaparin (LOVENOX) injection  80 mg Subcutaneous Q24H   fluticasone furoate-vilanterol  1 puff Inhalation Daily   [START ON 04/15/2023] furosemide  40 mg Oral Daily   insulin aspart  0-15 Units Subcutaneous TID WC   rosuvastatin  40 mg Oral Daily   Continuous Infusions:  azithromycin 500 mg (04/14/23 0048)   cefTRIAXone (ROCEPHIN)  IV 2 g (04/13/23 2352)   PRN Meds:.acetaminophen **OR** acetaminophen, albuterol, ondansetron **OR** ondansetron (ZOFRAN) IV, mouth rinse   Anti-infectives (From admission, onward)    Start     Dose/Rate Route Frequency Ordered Stop   04/12/23 0030  cefTRIAXone (ROCEPHIN) 2 g in sodium chloride 0.9 % 100 mL IVPB        2 g 200 mL/hr over 30 Minutes Intravenous Every 24 hours 04/12/23 0027 04/17/23 0029   04/12/23 0030  azithromycin (ZITHROMAX) 500 mg in sodium chloride 0.9 % 250 mL IVPB  500 mg 250 mL/hr over 60 Minutes Intravenous Every 24 hours 04/12/23 0027 04/17/23 0029      Subjective: Casimiro Needle [place]st[place] today has no fevers, no emesis,    - Concerns about nonsustained V. tach overnight--please see documentation from LPN Ms. Deetta Perla -Patient denies chest pains, or  palpitations or dizziness -Some dyspnea on exertion  Objective: Vitals:   04/14/23 0514 04/14/23 0719 04/14/23 0722 04/14/23 1515  BP: 128/74   (!) 140/70  Pulse: 81   77  Resp: 19   18  Temp: 98.8 F (37.1 C)   98.5 F (36.9 C)  TempSrc: Oral    Oral  SpO2: 96% 98% 98% 98%  Weight:      Height:        Intake/Output Summary (Last 24 hours) at 04/14/2023 1759 Last data filed at 04/14/2023 1100 Gross per 24 hour  Intake 2218.88 ml  Output 1250 ml  Net 968.88 ml   Filed Weights   04/12/23 0023 04/12/23 1221 04/13/23 0010  Weight: (!) 169.6 kg (!) 168.7 kg (!) 172 kg   Physical Exam  Gen:- Awake Alert, no conversational dyspnea  HEENT:- [place].AT, No sclera icterus Nose-  3L/min Neck-Supple Neck,No JVD,.  Lungs-improving air movement, no wheezing  CV- S1, S2 normal, irregular , not irregularly irregular Abd-  +ve B.Sounds, Abd Soft, No tenderness, increased truncal adiposity Extremity/Skin:-Improving edema, pedal pulses present  Psych-affect is appropriate, oriented x3 Neuro-no new focal deficits, no tremors  Data Reviewed: I have personally reviewed following labs and imaging studies  CBC: Recent Labs  Lab 04/12/23 0034 04/13/23 0502 04/14/23 0522  WBC 20.4* 15.8* 13.7*  NEUTROABS 18.4*  --   --   HGB 15.2 13.4 13.8  HCT 48.1 42.8 43.7  MCV 77.5* 79.1* 77.8*  PLT 216 187 202   Basic Metabolic Panel: Recent Labs  Lab 04/12/23 0034 04/12/23 0219 04/13/23 0502 04/13/23 2325 04/14/23 0522  NA 135  --  136 136 136  K 4.0  --  3.8 3.7 3.8  CL 102  --  104 103 106  CO2 23  --  23 23 21*  GLUCOSE 187*  --  134* 130* 143*  BUN 15  --  16 15 15   CREATININE 1.64*  --  1.45* 1.45* 1.28*  CALCIUM 8.9  --  8.3* 8.7* 8.6*  MG  --  1.7  --  2.1 2.1  PHOS  --  1.8*  --   --   --    GFR: Estimated Creatinine Clearance: 99 mL/min (A) (by C-G formula based on SCr of 1.28 mg/dL (H)). Liver Function Tests: Recent Labs  Lab 04/12/23 0034 04/13/23 0502  AST 20 15  ALT 59* 33  ALKPHOS 120 90  BILITOT 1.1 0.9  PROT 8.0 6.8  ALBUMIN 3.6 2.9*   HbA1C: Recent Labs    04/12/23 0219  HGBA1C 6.0*    Recent Results (from the past 240 hour(s))  Blood Culture (routine x 2)     Status: None (Preliminary result)    Collection Time: 04/12/23 12:34 AM   Specimen: BLOOD RIGHT FOREARM  Result Value Ref Range Status   Specimen Description BLOOD RIGHT FOREARM  Final   Special Requests   Final    BOTTLES DRAWN AEROBIC AND ANAEROBIC Blood Culture adequate volume   Culture   Final    NO GROWTH 2 DAYS Performed at [facility], [address], [place], [place] [address]    Report Status PENDING  Incomplete  Resp  panel by RT-PCR (RSV, Flu A&B, Covid) Anterior Nasal Swab     Status: None   Collection Time: 04/12/23 12:36 AM   Specimen: Anterior Nasal Swab  Result Value Ref Range Status   SARS Coronavirus 2 by RT PCR NEGATIVE NEGATIVE Final    Comment: (NOTE) SARS-CoV-2 target nucleic acids are NOT DETECTED.  The SARS-CoV-2 RNA is generally detectable in upper respiratory specimens during the acute phase of infection. The lowest concentration of SARS-CoV-2 viral copies this assay can detect is 138 copies/mL. A negative result does not preclude SARS-Cov-2 infection and should not be used as the sole basis for treatment or other patient management decisions. A negative result may occur with  improper specimen collection/handling, submission of specimen other than nasopharyngeal swab, presence of viral mutation(s) within the areas targeted by this assay, and inadequate number of viral copies(<138 copies/mL). A negative result must be combined with clinical observations, patient history, and epidemiological information. The expected result is Negative.  Fact Sheet for Patients:  BloggerCourse.com  Fact Sheet for Healthcare Providers:  SeriousBroker.it  This test is no t yet approved or cleared by the [place] FDA and  has been authorized for detection and/or diagnosis of SARS-CoV-2 by FDA under an Emergency Use Authorization (EUA). This EUA will remain  in effect (meaning this test can be used) for the duration of the COVID-19 declaration under  Section 564(b)(1) of the Act, 21 U.S.C.section 360bbb-3(b)(1), unless the authorization is terminated  or revoked sooner.       Influenza A by PCR NEGATIVE NEGATIVE Final   Influenza B by PCR NEGATIVE NEGATIVE Final    Comment: (NOTE) The Xpert Xpress SARS-CoV-2/FLU/RSV plus assay is intended as an aid in the diagnosis of influenza from Nasopharyngeal swab specimens and should not be used as a sole basis for treatment. Nasal washings and aspirates are unacceptable for Xpert Xpress SARS-CoV-2/FLU/RSV testing.  Fact Sheet for Patients: BloggerCourse.com  Fact Sheet for Healthcare Providers: SeriousBroker.it  This test is not yet approved or cleared by the [place] FDA and has been authorized for detection and/or diagnosis of SARS-CoV-2 by FDA under an Emergency Use Authorization (EUA). This EUA will remain in effect (meaning this test can be used) for the duration of the COVID-19 declaration under Section 564(b)(1) of the Act, 21 U.S.C. section 360bbb-3(b)(1), unless the authorization is terminated or revoked.     Resp Syncytial Virus by PCR NEGATIVE NEGATIVE Final    Comment: (NOTE) Fact Sheet for Patients: BloggerCourse.com  Fact Sheet for Healthcare Providers: SeriousBroker.it  This test is not yet approved or cleared by the [place] FDA and has been authorized for detection and/or diagnosis of SARS-CoV-2 by FDA under an Emergency Use Authorization (EUA). This EUA will remain in effect (meaning this test can be used) for the duration of the COVID-19 declaration under Section 564(b)(1) of the Act, 21 U.S.C. section 360bbb-3(b)(1), unless the authorization is terminated or revoked.  Performed at [facility], [address], [place], [place] [address]   Blood Culture (routine x 2)     Status: None (Preliminary result)   Collection Time: 04/12/23 12:50 AM    Specimen: BLOOD LEFT ARM  Result Value Ref Range Status   Specimen Description BLOOD LEFT ARM  Final   Special Requests   Final    BOTTLES DRAWN AEROBIC AND ANAEROBIC Blood Culture adequate volume   Culture   Final    NO GROWTH 2 DAYS Performed at [facility], [address], [place], [place]  [address]    Report Status PENDING  Incomplete  MRSA Next Gen by PCR, Nasal     Status: None   Collection Time: 04/12/23 12:16 PM   Specimen: Nasal Mucosa; Nasal Swab  Result Value Ref Range Status   MRSA by PCR Next Gen NOT DETECTED NOT DETECTED Final    Comment: (NOTE) The GeneXpert MRSA Assay (FDA approved for NASAL specimens only), is one component of a comprehensive MRSA colonization surveillance program. It is not intended to diagnose MRSA infection nor to guide or monitor treatment for MRSA infections. Test performance is not FDA approved in patients less than 24 years old. Performed at [facility], [address], [place], [place] [address]   Urine Culture     Status: None   Collection Time: 04/12/23  1:25 PM   Specimen: Urine, Clean Catch  Result Value Ref Range Status   Specimen Description   Final    URINE, CLEAN CATCH Performed at [facility], [address] [place], [place] [address]    Special Requests   Final    NONE Reflexed from [id number] Performed at [facility], [address], [place], [place] [address]    Culture   Final    NO GROWTH Performed at [facility], [address] [place], [place] [address]    Report Status 04/13/2023 FINAL  Final    Radiology Studies: No results found.  Scheduled Meds:  Chlorhexidine Gluconate Cloth  6 each Topical Q0600   dextromethorphan-guaiFENesin  1 tablet Oral BID   enoxaparin (LOVENOX) injection  80 mg Subcutaneous Q24H   fluticasone furoate-vilanterol  1 puff Inhalation Daily   [START ON 04/15/2023] furosemide  40 mg Oral Daily   insulin aspart  0-15 Units Subcutaneous TID WC   rosuvastatin  40 mg Oral Daily    Continuous Infusions:  azithromycin 500 mg (04/14/23 0048)   cefTRIAXone (ROCEPHIN)  IV 2 g (04/13/23 2352)    LOS: 2 days   Shon Hale M.D on 04/14/2023 at 5:59 PM  Go to [email or web address] - for contact info  [facility] - Office  [phone]  If 7PM-7AM, please contact night-coverage [email or web address] 04/14/2023, 5:59 PM

## 2023-04-15 DIAGNOSIS — Z6841 Body Mass Index (BMI) 40.0 and over, adult: Secondary | ICD-10-CM | POA: Diagnosis not present

## 2023-04-15 DIAGNOSIS — I10 Essential (primary) hypertension: Secondary | ICD-10-CM | POA: Diagnosis not present

## 2023-04-15 DIAGNOSIS — J189 Pneumonia, unspecified organism: Secondary | ICD-10-CM | POA: Diagnosis not present

## 2023-04-15 LAB — BASIC METABOLIC PANEL
Anion gap: 8 (ref 5–15)
BUN: 13 mg/dL (ref 6–20)
CO2: 23 mmol/L (ref 22–32)
Calcium: 8.7 mg/dL — ABNORMAL LOW (ref 8.9–10.3)
Chloride: 105 mmol/L (ref 98–111)
Creatinine, Ser: 1.19 mg/dL (ref 0.61–1.24)
GFR, Estimated: >60 mL/min (ref >60)
Glucose, Bld: 127 mg/dL — ABNORMAL HIGH (ref 70–99)
Potassium: 4 mmol/L (ref 3.5–5.1)
Sodium: 136 mmol/L (ref 135–145)

## 2023-04-15 LAB — GLUCOSE, CAPILLARY
Glucose-Capillary: 120 mg/dL — ABNORMAL HIGH (ref 70–99)
Glucose-Capillary: 136 mg/dL — ABNORMAL HIGH (ref 70–99)
Glucose-Capillary: 97 mg/dL (ref 70–99)
Glucose-Capillary: 167 mg/dL — ABNORMAL HIGH (ref 70–99)

## 2023-04-15 LAB — CULTURE, BLOOD (ROUTINE X 2)

## 2023-04-15 LAB — CBC
HCT: 42.7 % (ref 39.0–52.0)
Hemoglobin: 13.6 g/dL (ref 13.0–17.0)
MCH: 24.7 pg — ABNORMAL LOW (ref 26.0–34.0)
MCHC: 31.9 g/dL (ref 30.0–36.0)
MCV: 77.5 fL — ABNORMAL LOW (ref 80.0–100.0)
Platelets: 236 10*3/uL (ref 150–400)
RBC: 5.51 MIL/uL (ref 4.22–5.81)
RDW: 16.8 % — ABNORMAL HIGH (ref 11.5–15.5)
WBC: 11.8 10*3/uL — ABNORMAL HIGH (ref 4.0–10.5)
nRBC: 0 % (ref 0.0–0.2)

## 2023-04-15 MED ORDER — ALBUTEROL SULFATE (2.5 MG/3ML) 0.083% IN NEBU
2.5000 mg | INHALATION_SOLUTION | Freq: Two times a day (BID) | RESPIRATORY_TRACT | Status: DC
  Administered 2023-04-15 – 2023-04-17 (×4): 2.5 mg via RESPIRATORY_TRACT
  Filled 2023-04-15 (×4): qty 3

## 2023-04-15 MED ORDER — METHYLPREDNISOLONE SODIUM SUCC 40 MG IJ SOLR
40.0000 mg | Freq: Two times a day (BID) | INTRAMUSCULAR | Status: DC
  Administered 2023-04-15 – 2023-04-17 (×4): 40 mg via INTRAVENOUS
  Filled 2023-04-15 (×4): qty 1

## 2023-04-15 MED ORDER — SALINE SPRAY 0.65 % NA SOLN
1.0000 | NASAL | Status: DC | PRN
  Administered 2023-04-15: 1 via NASAL
  Filled 2023-04-15: qty 44

## 2023-04-15 NOTE — Progress Notes (Signed)
PROGRESS NOTE     Jason Meyer, is a 62 y.o. male, DOB - Apr 23, 1961, ZOX:[id number]  Admit date - 04/12/2023   Admitting Physician Kaelynn Igo [name], MD  Outpatient Primary MD for the patient is [name], MD  LOS - 3  Chief Complaint  Patient presents with   Shortness of Breath        Brief Summary:-  62 y.o. male with medical history significant of DM2, HTN, obesity, obstructive sleep apnea admitted on 04/12/2023 with sepsis secondary to community-acquired pneumonia   A/p 1)Sepsis secondary to CAP and possible UTI----POA -UA suspicious for UTI urine culture pending -Continue Rocephin and azithromycin, bronchodilators and mucolytics as ordered -04/15/23 -Cough dyspnea with exertion and hypoxia persist-- -patient ambulated about 100 feet .Marland Kitchen  Patient became very dyspneic, had coughing spell, and O2 sats dropped to 79% on room air, placed back on 3 L of nasal cannula O2 saturation back up to 94% -Add Solu-Medrol -WBC trending down   2) arrhythmia concerns--Concerns about nonsustained V. tach on 04/13/23 (no further significant arrhythmia concerns --please see documentation from LPN Ms. Deetta Perla -Patient denies chest pains, or  palpitations or dizziness -EKG sinus rhythm largely unchanged from prior Troponin 28 >> 25   3)DM2- Use Novolog/Humalog Sliding scale insulin with Accu-Cheks/Fingersticks as ordered    4)H/o HTN--- hypotension noted -Serum a.m. cortisol is not low -Hold amlodipine, hold olmesartan and hydralazine   5)COPD--continue bronchodilators -Add Solu-Medrol  6) acute hypoxic respiratory failure--- secondary to #1 above/pneumonia -Management as above in #1  7)AKI----acute kidney injury   -Baseline creatinine 1.0 to1.2 -Admission creatinine 1.64 -Creatinine down to 1.19 --renally adjust medications, avoid nephrotoxic agents / dehydration  / hypotension  8)OSA--- BiPAP nightly advised   9)Morbid Obesity- -Low calorie diet, portion control  and increase physical activity discussed with patient -Body mass index is 51.87 kg/m.    Status is: Inpatient   Disposition: The patient is from: Home              Anticipated d/c is to: Home              Anticipated d/c date is: 1 day              Patient currently is not medically stable to d/c. Barriers: Not Clinically Stable-   Code Status :  -  Code Status: Full Code   Family Communication:    (patient is alert, awake and coherent)  Discussed with son at bedside  DVT Prophylaxis  :   - SCDs  SCDs Start: 04/12/23 0336   Lab Results  Component Value Date   PLT 236 04/15/2023   Inpatient Medications  Scheduled Meds:  albuterol  2.5 mg Nebulization BID   dextromethorphan-guaiFENesin  1 tablet Oral BID   enoxaparin (LOVENOX) injection  80 mg Subcutaneous Q24H   fluticasone furoate-vilanterol  1 puff Inhalation Daily   furosemide  40 mg Oral Daily   insulin aspart  0-15 Units Subcutaneous TID WC   rosuvastatin  40 mg Oral Daily   Continuous Infusions:  azithromycin 500 mg (04/14/23 2341)   cefTRIAXone (ROCEPHIN)  IV Stopped (04/14/23 2335)   PRN Meds:.acetaminophen **OR** acetaminophen, albuterol, ondansetron **OR** ondansetron (ZOFRAN) IV, mouth rinse, sodium chloride   Anti-infectives (From admission, onward)    Start     Dose/Rate Route Frequency Ordered Stop   04/12/23 0030  cefTRIAXone (ROCEPHIN) 2 g in sodium chloride 0.9 % 100 mL IVPB        2 g  200 mL/hr over 30 Minutes Intravenous Every 24 hours 04/12/23 0027 04/17/23 0029   04/12/23 0030  azithromycin (ZITHROMAX) 500 mg in sodium chloride 0.9 % 250 mL IVPB        500 mg 250 mL/hr over 60 Minutes Intravenous Every 24 hours 04/12/23 0027 04/17/23 0029      Subjective: Casimiro Needle Humphres today has no fevers, no emesis,    - -patient ambulated about 100 feet .Marland Kitchen  Patient became very dyspneic, had coughing spell, and O2 sats dropped to 79% on room air, placed back on 3 L of nasal cannula O2 saturation back up  to 94%  Objective: Vitals:   04/15/23 0719 04/15/23 0724 04/15/23 0737 04/15/23 1158  BP:   131/70 137/67  Pulse:   77 77  Resp:      Temp:    98.3 F (36.8 C)  TempSrc:    Oral  SpO2: 97% 97%  97%  Weight:      Height:        Intake/Output Summary (Last 24 hours) at 04/15/2023 1721 Last data filed at 04/15/2023 1400 Gross per 24 hour  Intake 690.53 ml  Output 1900 ml  Net -1209.47 ml   Filed Weights   04/12/23 0023 04/12/23 1221 04/13/23 0010  Weight: (!) 169.6 kg (!) 168.7 kg (!) 172 kg   Physical Exam  Gen:- Awake Alert, dyspnea on exertion  HEENT:- [place].AT, No sclera icterus Nose- [place] 3L/min Neck-Supple Neck,No JVD,.  Lungs-improving air movement, no wheezing  CV- S1, S2 normal, irregular , not irregularly irregular Abd-  +ve B.Sounds, Abd Soft, No tenderness, increased truncal adiposity Extremity/Skin:-Improving edema, pedal pulses present  Psych-affect is appropriate, oriented x3 Neuro-no new focal deficits, no tremors  Data Reviewed: I have personally reviewed following labs and imaging studies  CBC: Recent Labs  Lab 04/12/23 0034 04/13/23 0502 04/14/23 0522 04/15/23 0517  WBC 20.4* 15.8* 13.7* 11.8*  NEUTROABS 18.4*  --   --   --   HGB 15.2 13.4 13.8 13.6  HCT 48.1 42.8 43.7 42.7  MCV 77.5* 79.1* 77.8* 77.5*  PLT 216 187 202 236   Basic Metabolic Panel: Recent Labs  Lab 04/12/23 0034 04/12/23 0219 04/13/23 0502 04/13/23 2325 04/14/23 0522 04/15/23 0517  NA 135  --  136 136 136 136  K 4.0  --  3.8 3.7 3.8 4.0  CL 102  --  104 103 106 105  CO2 23  --  23 23 21* 23  GLUCOSE 187*  --  134* 130* 143* 127*  BUN 15  --  16 15 15 13   CREATININE 1.64*  --  1.45* 1.45* 1.28* 1.19  CALCIUM 8.9  --  8.3* 8.7* 8.6* 8.7*  MG  --  1.7  --  2.1 2.1  --   PHOS  --  1.8*  --   --   --   --    GFR: Estimated Creatinine Clearance: 106.4 mL/min (by C-G formula based on SCr of 1.19 mg/dL). Liver Function Tests: Recent Labs  Lab 04/12/23 0034 04/13/23 0502   AST 20 15  ALT 59* 33  ALKPHOS 120 90  BILITOT 1.1 0.9  PROT 8.0 6.8  ALBUMIN 3.6 2.9*   Recent Results (from the past 240 hour(s))  Blood Culture (routine x 2)     Status: None (Preliminary result)   Collection Time: 04/12/23 12:34 AM   Specimen: BLOOD RIGHT FOREARM  Result Value Ref Range Status   Specimen Description BLOOD RIGHT FOREARM  Final  Special Requests   Final    BOTTLES DRAWN AEROBIC AND ANAEROBIC Blood Culture adequate volume   Culture   Final    NO GROWTH 3 DAYS Performed at [facility], [address], [place], [place] [address]    Report Status PENDING  Incomplete  Resp panel by RT-PCR (RSV, Flu A&B, Covid) Anterior Nasal Swab     Status: None   Collection Time: 04/12/23 12:36 AM   Specimen: Anterior Nasal Swab  Result Value Ref Range Status   SARS Coronavirus 2 by RT PCR NEGATIVE NEGATIVE Final    Comment: (NOTE) SARS-CoV-2 target nucleic acids are NOT DETECTED.  The SARS-CoV-2 RNA is generally detectable in upper respiratory specimens during the acute phase of infection. The lowest concentration of SARS-CoV-2 viral copies this assay can detect is 138 copies/mL. A negative result does not preclude SARS-Cov-2 infection and should not be used as the sole basis for treatment or other patient management decisions. A negative result may occur with  improper specimen collection/handling, submission of specimen other than nasopharyngeal swab, presence of viral mutation(s) within the areas targeted by this assay, and inadequate number of viral copies(<138 copies/mL). A negative result must be combined with clinical observations, patient history, and epidemiological information. The expected result is Negative.  Fact Sheet for Patients:  BloggerCourse.com  Fact Sheet for Healthcare Providers:  SeriousBroker.it  This test is no t yet approved or cleared by the [place] FDA and  has been authorized for  detection and/or diagnosis of SARS-CoV-2 by FDA under an Emergency Use Authorization (EUA). This EUA will remain  in effect (meaning this test can be used) for the duration of the COVID-19 declaration under Section 564(b)(1) of the Act, 21 U.S.C.section 360bbb-3(b)(1), unless the authorization is terminated  or revoked sooner.       Influenza A by PCR NEGATIVE NEGATIVE Final   Influenza B by PCR NEGATIVE NEGATIVE Final    Comment: (NOTE) The Xpert Xpress SARS-CoV-2/FLU/RSV plus assay is intended as an aid in the diagnosis of influenza from Nasopharyngeal swab specimens and should not be used as a sole basis for treatment. Nasal washings and aspirates are unacceptable for Xpert Xpress SARS-CoV-2/FLU/RSV testing.  Fact Sheet for Patients: BloggerCourse.com  Fact Sheet for Healthcare Providers: SeriousBroker.it  This test is not yet approved or cleared by the [place] FDA and has been authorized for detection and/or diagnosis of SARS-CoV-2 by FDA under an Emergency Use Authorization (EUA). This EUA will remain in effect (meaning this test can be used) for the duration of the COVID-19 declaration under Section 564(b)(1) of the Act, 21 U.S.C. section 360bbb-3(b)(1), unless the authorization is terminated or revoked.     Resp Syncytial Virus by PCR NEGATIVE NEGATIVE Final    Comment: (NOTE) Fact Sheet for Patients: BloggerCourse.com  Fact Sheet for Healthcare Providers: SeriousBroker.it  This test is not yet approved or cleared by the [place] FDA and has been authorized for detection and/or diagnosis of SARS-CoV-2 by FDA under an Emergency Use Authorization (EUA). This EUA will remain in effect (meaning this test can be used) for the duration of the COVID-19 declaration under Section 564(b)(1) of the Act, 21 U.S.C. section 360bbb-3(b)(1), unless the authorization is  terminated or revoked.  Performed at [facility] [address], [place], [place] [address]   Blood Culture (routine x 2)     Status: None (Preliminary result)   Collection Time: 04/12/23 12:50 AM   Specimen: BLOOD LEFT ARM  Result Value Ref Range Status  Specimen Description BLOOD LEFT ARM  Final   Special Requests   Final    BOTTLES DRAWN AEROBIC AND ANAEROBIC Blood Culture adequate volume   Culture   Final    NO GROWTH 3 DAYS Performed at [facility], [address], [place], [place] [address]    Report Status PENDING  Incomplete  MRSA Next Gen by PCR, Nasal     Status: None   Collection Time: 04/12/23 12:16 PM   Specimen: Nasal Mucosa; Nasal Swab  Result Value Ref Range Status   MRSA by PCR Next Gen NOT DETECTED NOT DETECTED Final    Comment: (NOTE) The GeneXpert MRSA Assay (FDA approved for NASAL specimens only), is one component of a comprehensive MRSA colonization surveillance program. It is not intended to diagnose MRSA infection nor to guide or monitor treatment for MRSA infections. Test performance is not FDA approved in patients less than 54 years old. Performed at [facility], [address], [place], [place] [address]   Urine Culture     Status: None   Collection Time: 04/12/23  1:25 PM   Specimen: Urine, Clean Catch  Result Value Ref Range Status   Specimen Description   Final    URINE, CLEAN CATCH Performed at [facility], [address], [place], [place] [address]    Special Requests   Final    NONE Reflexed from [id number] Performed at [facility], [address] [place], [place] [address]    Culture   Final    NO GROWTH Performed at [facility], [address] [place], [place] [address]    Report Status 04/13/2023 FINAL  Final    Radiology Studies: No results found.  Scheduled Meds:  albuterol  2.5 mg Nebulization BID   dextromethorphan-guaiFENesin  1 tablet Oral BID   enoxaparin (LOVENOX) injection  80 mg Subcutaneous Q24H    fluticasone furoate-vilanterol  1 puff Inhalation Daily   furosemide  40 mg Oral Daily   insulin aspart  0-15 Units Subcutaneous TID WC   rosuvastatin  40 mg Oral Daily   Continuous Infusions:  azithromycin 500 mg (04/14/23 2341)   cefTRIAXone (ROCEPHIN)  IV Stopped (04/14/23 2335)    LOS: 3 days   Shon Hale M.D on 04/15/2023 at 5:21 PM  Go to [email or web address] - for contact info  [facility] - Office  [phone]  If 7PM-7AM, please contact night-coverage [email or web address] 04/15/2023, 5:21 PM

## 2023-04-16 DIAGNOSIS — J189 Pneumonia, unspecified organism: Secondary | ICD-10-CM | POA: Diagnosis not present

## 2023-04-16 DIAGNOSIS — I10 Essential (primary) hypertension: Secondary | ICD-10-CM | POA: Diagnosis not present

## 2023-04-16 DIAGNOSIS — Z6841 Body Mass Index (BMI) 40.0 and over, adult: Secondary | ICD-10-CM | POA: Diagnosis not present

## 2023-04-16 LAB — LEGIONELLA PNEUMOPHILA SEROGP 1 UR AG: L. pneumophila Serogp 1 Ur Ag: NEGATIVE

## 2023-04-16 LAB — GLUCOSE, CAPILLARY
Glucose-Capillary: 180 mg/dL — ABNORMAL HIGH (ref 70–99)
Glucose-Capillary: 201 mg/dL — ABNORMAL HIGH (ref 70–99)
Glucose-Capillary: 173 mg/dL — ABNORMAL HIGH (ref 70–99)
Glucose-Capillary: 248 mg/dL — ABNORMAL HIGH (ref 70–99)

## 2023-04-16 MED ORDER — AMLODIPINE BESYLATE 5 MG PO TABS
10.0000 mg | ORAL_TABLET | Freq: Every day | ORAL | Status: DC
  Administered 2023-04-16 – 2023-04-17 (×2): 10 mg via ORAL
  Filled 2023-04-16 (×2): qty 2

## 2023-04-16 NOTE — Progress Notes (Signed)
SATURATION QUALIFICATIONS: (This note is used to comply with regulatory documentation for home oxygen)  Patient Saturations on Room Air at Rest = 94%  Patient Saturations on Room Air while Ambulating = 88%  Patient Saturations on 1 Liters of oxygen while Ambulating = 94%  Please briefly explain why patient needs home oxygen: Patient oxygen saturations drop when ambulating on room air.

## 2023-04-16 NOTE — Progress Notes (Signed)
PROGRESS NOTE     Jason Meyer, is a 62 y.o. male, DOB - 05-26-1961, ZOX:[id number]  Admit date - 04/12/2023   Admitting Physician Jason Arutyunyan [name], MD  Outpatient Primary MD for the patient is Jason Olszewski, MD  LOS - 4  Chief Complaint  Patient presents with   Shortness of Breath        Brief Summary:-  62 y.o. male with medical history significant of DM2, HTN, obesity, obstructive sleep apnea admitted on 04/12/2023 with sepsis secondary to community-acquired pneumonia with acute hypoxic respiratory failure - Overall respiratory status is improving slowly... Anticipate possible discharge home in 24 to 48 hours hopefully without oxygen   A/p 1)Sepsis secondary to CAP and possible UTI----POA -UA suspicious for UTI urine culture pending -Continue Rocephin and azithromycin, bronchodilators and mucolytics as ordered -04/16/23 -Cough dyspnea with exertion and hypoxia persist-- -Patient ambulated about 70 feet .Marland Kitchen  Patient became dyspneic and O2 sats dropped to 88% on room air, placed back on 3 L of nasal cannula O2 saturation back up to 94% -c/n  Solu-Medrol -WBC trending down -Overall respiratory status is improving slowly... Anticipate possible discharge home in 24 to 48 hours hopefully without oxygen   2)Arrhythmia concerns--Concerns about nonsustained V. tach on 04/13/23 (no further significant arrhythmia concerns --please see documentation from LPN Ms. Jason Meyer -Patient denies chest pains, or  palpitations or dizziness -EKG sinus rhythm largely unchanged from prior Troponin 28 >> 25   3)DM2- -last A1c 6.0 reflecting excellent diabetic control PTA -Anticipate worsening hyperglycemia with steroids Use Novolog/Humalog Sliding scale insulin with Accu-Cheks/Fingersticks as ordered    4)H/o HTN--- hypotension noted -Serum a.m. cortisol is not low -Continue to hold olmesartan and hydralazine -Restart amlodipine   5)COPD--continue bronchodilators --Continue  Solu-Medrol  6) acute hypoxic respiratory failure--- secondary to #1 above/pneumonia -Management as above in #1  7)AKI----acute kidney injury   -Baseline creatinine 1.0 to1.2 -Admission creatinine 1.64 -Creatinine down to 1.19 --renally adjust medications, avoid nephrotoxic agents / dehydration  / hypotension  8)OSA--- BiPAP nightly advised   9)Morbid Obesity- -Low calorie diet, portion control and increase physical activity discussed with patient -Body mass index is 52.89 kg/m.    Status is: Inpatient   Disposition: The patient is from: Home              Anticipated d/c is to: Home---              Anticipated d/c date is: 1 day              Patient currently is not medically stable to d/c. Barriers: Not Clinically Stable-   Code Status :  -  Code Status: Full Code   Family Communication:    (patient is alert, awake and coherent)  Discussed with son at bedside  DVT Prophylaxis  :   - SCDs  SCDs Start: 04/12/23 0336   Lab Results  Component Value Date   PLT 236 04/15/2023   Inpatient Medications  Scheduled Meds:  albuterol  2.5 mg Nebulization BID   dextromethorphan-guaiFENesin  1 tablet Oral BID   enoxaparin (LOVENOX) injection  80 mg Subcutaneous Q24H   fluticasone furoate-vilanterol  1 puff Inhalation Daily   furosemide  40 mg Oral Daily   insulin aspart  0-15 Units Subcutaneous TID WC   methylPREDNISolone (SOLU-MEDROL) injection  40 mg Intravenous Q12H   rosuvastatin  40 mg Oral Daily   Continuous Infusions:   PRN Meds:.acetaminophen **OR** acetaminophen, albuterol, ondansetron **OR** ondansetron (ZOFRAN) IV, mouth  rinse, sodium chloride   Anti-infectives (From admission, onward)    Start     Dose/Rate Route Frequency Ordered Stop   04/12/23 0030  cefTRIAXone (ROCEPHIN) 2 g in sodium chloride 0.9 % 100 mL IVPB        2 g 200 mL/hr over 30 Minutes Intravenous Every 24 hours 04/12/23 0027 04/16/23 0042   04/12/23 0030  azithromycin (ZITHROMAX) 500 mg in  sodium chloride 0.9 % 250 mL IVPB        500 mg 250 mL/hr over 60 Minutes Intravenous Every 24 hours 04/12/23 0027 04/16/23 0981      Subjective: Jason Meyer today has no fevers, no emesis,    - Patient ambulated about 70 feet .Marland Kitchen  Patient became dyspneic and O2 sats dropped to 88% on room air, placed back on 3 L of nasal cannula O2 saturation back up to 94% Overall respiratory status is improving slowly... Anticipate possible discharge home in 24 to 48 hours hopefully without oxygen  Objective: Vitals:   04/15/23 2219 04/16/23 0444 04/16/23 0715 04/16/23 0947  BP: 123/62 (!) 138/95  (!) 151/80  Pulse: 88 83  88  Resp: 20 20  16   Temp: 98.7 F (37.1 C) 97.6 F (36.4 C)  97.9 F (36.6 C)  TempSrc: Oral Oral  Oral  SpO2: 94% 97% 98% 94%  Weight:      Height:        Intake/Output Summary (Last 24 hours) at 04/16/2023 1235 Last data filed at 04/16/2023 0900 Gross per 24 hour  Intake 600 ml  Output 1975 ml  Net -1375 ml   Filed Weights   04/12/23 0023 04/12/23 1221 04/13/23 0010  Weight: (!) 169.6 kg (!) 168.7 kg (!) 172 kg   Physical Exam  Gen:- Awake Alert, dyspnea on exertion  HEENT:- [place].AT, No sclera icterus Nose- [place] 3L/min Neck-Supple Neck,No JVD,.  Lungs-improving air movement, no wheezing  CV- S1, S2 normal, irregular , not irregularly irregular Abd-  +ve B.Sounds, Abd Soft, No tenderness, increased truncal adiposity Extremity/Skin:-Improving edema, pedal pulses present  Psych-affect is appropriate, oriented x3 Neuro-no new focal deficits, no tremors  Data Reviewed: I have personally reviewed following labs and imaging studies  CBC: Recent Labs  Lab 04/12/23 0034 04/13/23 0502 04/14/23 0522 04/15/23 0517  WBC 20.4* 15.8* 13.7* 11.8*  NEUTROABS 18.4*  --   --   --   HGB 15.2 13.4 13.8 13.6  HCT 48.1 42.8 43.7 42.7  MCV 77.5* 79.1* 77.8* 77.5*  PLT 216 187 202 236   Basic Metabolic Panel: Recent Labs  Lab 04/12/23 0034 04/12/23 0219 04/13/23 0502  04/13/23 2325 04/14/23 0522 04/15/23 0517  NA 135  --  136 136 136 136  K 4.0  --  3.8 3.7 3.8 4.0  CL 102  --  104 103 106 105  CO2 23  --  23 23 21* 23  GLUCOSE 187*  --  134* 130* 143* 127*  BUN 15  --  16 15 15 13   CREATININE 1.64*  --  1.45* 1.45* 1.28* 1.19  CALCIUM 8.9  --  8.3* 8.7* 8.6* 8.7*  MG  --  1.7  --  2.1 2.1  --   PHOS  --  1.8*  --   --   --   --    GFR: Estimated Creatinine Clearance: 106.4 mL/min (by C-G formula based on SCr of 1.19 mg/dL). Liver Function Tests: Recent Labs  Lab 04/12/23 0034 04/13/23 0502  AST 20 15  ALT 59* 33  ALKPHOS 120 90  BILITOT 1.1 0.9  PROT 8.0 6.8  ALBUMIN 3.6 2.9*   Recent Results (from the past 240 hour(s))  Blood Culture (routine x 2)     Status: None (Preliminary result)   Collection Time: 04/12/23 12:34 AM   Specimen: BLOOD RIGHT FOREARM  Result Value Ref Range Status   Specimen Description BLOOD RIGHT FOREARM  Final   Special Requests   Final    BOTTLES DRAWN AEROBIC AND ANAEROBIC Blood Culture adequate volume   Culture   Final    NO GROWTH 3 DAYS Performed at [facility], [address], [place], [place] [address]    Report Status PENDING  Incomplete  Resp panel by RT-PCR (RSV, Flu A&B, Covid) Anterior Nasal Swab     Status: None   Collection Time: 04/12/23 12:36 AM   Specimen: Anterior Nasal Swab  Result Value Ref Range Status   SARS Coronavirus 2 by RT PCR NEGATIVE NEGATIVE Final    Comment: (NOTE) SARS-CoV-2 target nucleic acids are NOT DETECTED.  The SARS-CoV-2 RNA is generally detectable in upper respiratory specimens during the acute phase of infection. The lowest concentration of SARS-CoV-2 viral copies this assay can detect is 138 copies/mL. A negative result does not preclude SARS-Cov-2 infection and should not be used as the sole basis for treatment or other patient management decisions. A negative result may occur with  improper specimen collection/handling, submission of specimen other than  nasopharyngeal swab, presence of viral mutation(s) within the areas targeted by this assay, and inadequate number of viral copies(<138 copies/mL). A negative result must be combined with clinical observations, patient history, and epidemiological information. The expected result is Negative.  Fact Sheet for Patients:  BloggerCourse.com  Fact Sheet for Healthcare Providers:  SeriousBroker.it  This test is no t yet approved or cleared by the [place] FDA and  has been authorized for detection and/or diagnosis of SARS-CoV-2 by FDA under an Emergency Use Authorization (EUA). This EUA will remain  in effect (meaning this test can be used) for the duration of the COVID-19 declaration under Section 564(b)(1) of the Act, 21 U.S.C.section 360bbb-3(b)(1), unless the authorization is terminated  or revoked sooner.       Influenza A by PCR NEGATIVE NEGATIVE Final   Influenza B by PCR NEGATIVE NEGATIVE Final    Comment: (NOTE) The Xpert Xpress SARS-CoV-2/FLU/RSV plus assay is intended as an aid in the diagnosis of influenza from Nasopharyngeal swab specimens and should not be used as a sole basis for treatment. Nasal washings and aspirates are unacceptable for Xpert Xpress SARS-CoV-2/FLU/RSV testing.  Fact Sheet for Patients: BloggerCourse.com  Fact Sheet for Healthcare Providers: SeriousBroker.it  This test is not yet approved or cleared by the [place] FDA and has been authorized for detection and/or diagnosis of SARS-CoV-2 by FDA under an Emergency Use Authorization (EUA). This EUA will remain in effect (meaning this test can be used) for the duration of the COVID-19 declaration under Section 564(b)(1) of the Act, 21 U.S.C. section 360bbb-3(b)(1), unless the authorization is terminated or revoked.     Resp Syncytial Virus by PCR NEGATIVE NEGATIVE Final    Comment:  (NOTE) Fact Sheet for Patients: BloggerCourse.com  Fact Sheet for Healthcare Providers: SeriousBroker.it  This test is not yet approved or cleared by the [place] FDA and has been authorized for detection and/or diagnosis of SARS-CoV-2 by FDA under an Emergency Use Authorization (EUA). This EUA will remain in effect (meaning this test can  be used) for the duration of the COVID-19 declaration under Section 564(b)(1) of the Act, 21 U.S.C. section 360bbb-3(b)(1), unless the authorization is terminated or revoked.  Performed at [facility], [address] [place], [place] [address]   Blood Culture (routine x 2)     Status: None (Preliminary result)   Collection Time: 04/12/23 12:50 AM   Specimen: BLOOD LEFT ARM  Result Value Ref Range Status   Specimen Description BLOOD LEFT ARM  Final   Special Requests   Final    BOTTLES DRAWN AEROBIC AND ANAEROBIC Blood Culture adequate volume   Culture   Final    NO GROWTH 3 DAYS Performed at [facility], [address] [place], [place] [address]    Report Status PENDING  Incomplete  MRSA Next Gen by PCR, Nasal     Status: None   Collection Time: 04/12/23 12:16 PM   Specimen: Nasal Mucosa; Nasal Swab  Result Value Ref Range Status   MRSA by PCR Next Gen NOT DETECTED NOT DETECTED Final    Comment: (NOTE) The GeneXpert MRSA Assay (FDA approved for NASAL specimens only), is one component of a comprehensive MRSA colonization surveillance program. It is not intended to diagnose MRSA infection nor to guide or monitor treatment for MRSA infections. Test performance is not FDA approved in patients less than 82 years old. Performed at [facility], [address], [place], [place] [address]   Urine Culture     Status: None   Collection Time: 04/12/23  1:25 PM   Specimen: Urine, Clean Catch  Result Value Ref Range Status   Specimen Description   Final    URINE, CLEAN CATCH Performed at [facility], [address], [place], [place] [address]    Special Requests   Final    NONE Reflexed from [id number] Performed at [facility], [address], [place], [place] [address]    Culture   Final    NO GROWTH Performed at [facility], [address], [place], [place] [address]    Report Status 04/13/2023 FINAL  Final    Radiology Studies: No results found.  Scheduled Meds:  albuterol  2.5 mg Nebulization BID   dextromethorphan-guaiFENesin  1 tablet Oral BID   enoxaparin (LOVENOX) injection  80 mg Subcutaneous Q24H   fluticasone furoate-vilanterol  1 puff Inhalation Daily   furosemide  40 mg Oral Daily   insulin aspart  0-15 Units Subcutaneous TID WC   methylPREDNISolone (SOLU-MEDROL) injection  40 mg Intravenous Q12H   rosuvastatin  40 mg Oral Daily   Continuous Infusions:   LOS: 4 days   Shon Hale M.D on 04/16/2023 at 12:35 PM  Go to [email or web address] - for contact info  [facility] - Office  [phone]  If 7PM-7AM, please contact night-coverage [email or web address] 04/16/2023, 12:35 PM

## 2023-04-17 DIAGNOSIS — Z6841 Body Mass Index (BMI) 40.0 and over, adult: Secondary | ICD-10-CM | POA: Diagnosis not present

## 2023-04-17 DIAGNOSIS — J189 Pneumonia, unspecified organism: Secondary | ICD-10-CM | POA: Diagnosis not present

## 2023-04-17 DIAGNOSIS — I10 Essential (primary) hypertension: Secondary | ICD-10-CM | POA: Diagnosis not present

## 2023-04-17 LAB — GLUCOSE, CAPILLARY
Glucose-Capillary: 180 mg/dL — ABNORMAL HIGH (ref 70–99)
Glucose-Capillary: 214 mg/dL — ABNORMAL HIGH (ref 70–99)

## 2023-04-17 LAB — CULTURE, BLOOD (ROUTINE X 2): Culture: NO GROWTH

## 2023-04-17 MED ORDER — TIRZEPATIDE 7.5 MG/0.5ML ~~LOC~~ SOAJ
7.5000 mg | SUBCUTANEOUS | 5 refills | Status: DC
Start: 2023-04-17 — End: 2023-04-25

## 2023-04-17 MED ORDER — CEFDINIR 300 MG PO CAPS: 300.0000 mg | ORAL_CAPSULE | Freq: Two times a day (BID) | ORAL | 0 refills | Status: AC

## 2023-04-17 MED ORDER — OLMESARTAN MEDOXOMIL 40 MG PO TABS
40.0000 mg | ORAL_TABLET | Freq: Every day | ORAL | 11 refills | Status: DC
Start: 2023-04-17 — End: 2023-11-14

## 2023-04-17 MED ORDER — ROSUVASTATIN CALCIUM 40 MG PO TABS
40.0000 mg | ORAL_TABLET | Freq: Every day | ORAL | 3 refills | Status: DC
Start: 2023-04-17 — End: 2023-11-14

## 2023-04-17 MED ORDER — DOXYCYCLINE HYCLATE 100 MG PO TABS: 100.0000 mg | ORAL_TABLET | Freq: Two times a day (BID) | ORAL | 0 refills | Status: AC

## 2023-04-17 MED ORDER — CELECOXIB 200 MG PO CAPS
200.0000 mg | ORAL_CAPSULE | Freq: Every day | ORAL | 2 refills | Status: DC
Start: 2023-04-17 — End: 2023-08-07

## 2023-04-17 MED ORDER — AMLODIPINE BESYLATE 10 MG PO TABS
10.0000 mg | ORAL_TABLET | Freq: Every day | ORAL | 3 refills | Status: AC
Start: 2023-04-17 — End: ?

## 2023-04-17 MED ORDER — FUROSEMIDE 40 MG PO TABS
40.0000 mg | ORAL_TABLET | Freq: Every day | ORAL | 3 refills | Status: AC
Start: 2023-04-17 — End: ?

## 2023-04-17 MED ORDER — ALBUTEROL SULFATE HFA 108 (90 BASE) MCG/ACT IN AERS
2.0000 | INHALATION_SPRAY | Freq: Four times a day (QID) | RESPIRATORY_TRACT | 10 refills | Status: DC | PRN
Start: 2023-04-17 — End: 2023-11-14

## 2023-04-17 MED ORDER — METFORMIN HCL 1000 MG PO TABS
1000.0000 mg | ORAL_TABLET | Freq: Two times a day (BID) | ORAL | 3 refills | Status: DC
Start: 2023-04-17 — End: 2023-11-14

## 2023-04-17 MED ORDER — FLUTICASONE FUROATE-VILANTEROL 100-25 MCG/ACT IN AEPB: 1.0000 | INHALATION_SPRAY | Freq: Every day | RESPIRATORY_TRACT | 11 refills | Status: DC

## 2023-04-17 MED ORDER — IPRATROPIUM-ALBUTEROL 0.5-2.5 (3) MG/3ML IN SOLN
3.0000 mL | Freq: Once | RESPIRATORY_TRACT | Status: AC
  Administered 2023-04-17: 3 mL via RESPIRATORY_TRACT
  Filled 2023-04-17: qty 3

## 2023-04-17 MED ORDER — PREDNISONE 20 MG PO TABS: 40.0000 mg | ORAL_TABLET | Freq: Every day | ORAL | 0 refills | Status: AC

## 2023-04-17 MED ORDER — PANTOPRAZOLE SODIUM 40 MG PO TBEC: 40.0000 mg | DELAYED_RELEASE_TABLET | Freq: Every day | ORAL | 1 refills | Status: DC

## 2023-04-17 MED ORDER — ALBUTEROL SULFATE (2.5 MG/3ML) 0.083% IN NEBU
2.5000 mg | INHALATION_SOLUTION | Freq: Four times a day (QID) | RESPIRATORY_TRACT | 10 refills | Status: DC | PRN
Start: 2023-04-17 — End: 2023-11-14

## 2023-04-17 MED ORDER — BISOPROLOL FUMARATE 10 MG PO TABS
20.0000 mg | ORAL_TABLET | Freq: Every day | ORAL | 3 refills | Status: DC
Start: 2023-04-17 — End: 2023-11-14

## 2023-04-17 MED ORDER — ACETAMINOPHEN 325 MG PO TABS: 650.0000 mg | ORAL_TABLET | Freq: Four times a day (QID) | ORAL | 1 refills | Status: AC | PRN

## 2023-04-17 NOTE — Discharge Instructions (Signed)
1)Continue to Use CPAP every night 2)follow up with [name], MD (Primary Care Physician) in 1 to 2 weeks

## 2023-04-17 NOTE — Progress Notes (Signed)
Went over discharge information with patient and wife Jason Meyer.

## 2023-04-17 NOTE — Discharge Summary (Addendum)
Jason Meyer, is a 62 y.o. male  DOB 1961-08-01  MRN [id number].  Admission date:  04/12/2023  Admitting Physician  Shon Hale, MD  Discharge Date:  04/17/2023   Primary MD  Jason Olszewski, MD  Recommendations for primary care physician for things to follow:   1)Continue to Use CPAP every night 2)follow up with Jason Olszewski, MD (Primary Care Physician) in 1 to 2 weeks  Admission Diagnosis  CAP (community acquired pneumonia) [J18.9] Acute respiratory failure with hypoxia (HCC) [J96.01] Sepsis (HCC) [A41.9] Community acquired pneumonia, unspecified laterality [J18.9]   Discharge Diagnosis  CAP (community acquired pneumonia) [J18.9] Acute respiratory failure with hypoxia (HCC) [J96.01] Sepsis (HCC) [A41.9] Community acquired pneumonia, unspecified laterality [J18.9]    Principal Problem:   Sepsis (HCC) Active Problems:   Essential hypertension   Morbid obesity with BMI of 50.0-59.9, adult (HCC)   Mixed hyperlipidemia   Microcytic anemia   CAP (community acquired pneumonia)   Low mean corpuscular volume (MCV)   Type 2 diabetes mellitus with hyperglycemia (HCC)      Past Medical History:  Diagnosis Date   Allergy    Arthritis    Chronic diastolic heart failure (HCC) 08/11/2022   Admitted with acute hypoxemic respiratory failure in the setting of acute diastolic CHF exacerbation      Coronary artery calcification 10/19/2022   Noted on CTA  which also noted Cardiac enlargement, bilateral pleural effusions and diffuse ground-glass attenuation suggestive of mild CHF. 07/2022 referral to cardiology   Hyperlipidemia    Hypertension    OSA (obstructive sleep apnea) 10/19/2022   Sleep apnea    Type 2 diabetes mellitus (HCC)     Past Surgical History:  Procedure Laterality Date   NO PAST SURGERIES       HPI  from the history and physical done on the day of admission:     HPI:  Jason Meyer is a 62 y.o. male with medical history significant of hypertension, diabetes mellitus, obesity, obstructive sleep apnea who presents to the emergency department due to several days of onset of chest congestion, cough, chills, fever which is now associated with worsening shortness of breath.  Patient states that due to a home COVID test which was negative.  He denies able to the ED for further evaluation and management.   ED Course:  In the emergency department, patient was reported to ORIF with an O2 sat of 80% on room air.  Supplemental oxygen at 4 LPM was provided with improved O2 sats to 90 to 91%.  He was febrile, tachypneic and tachycardic, BP was 152/91.  Workup in the ED showed WBC 20.4, hemoglobin 15.2, hematocrit 40.4, MCV 77.5, platelets 216.  BMP was normal except for blood glucose of 187 and creatinine of 1.64 (baseline creatinine at 1.0-1.2).  Lactic acid was normal.  Influenza A, B, SARS coronavirus 2, RSV was negative.  Blood culture was pending Chest x-ray showed bibasilar atelectasis or infiltrates CT angiography chest with contrast was negative for acute pulmonary embolus.  Heterogeneous consolidations in the left greater than right lower lobes consistent with pneumonia.  Stable to slightly increased mediastinal adenopathy likely reactive. Patient was empirically started on IV ceftriaxone and azithromycin, IV hydration was provided   Review of Systems: Review of systems as noted in the HPI. All other systems reviewed and are negative.    Hospital Course:   Brief Summary:-  62 y.o. male with medical history significant of DM2, HTN, obesity, obstructive sleep apnea admitted on 04/12/2023 with sepsis secondary to community-acquired pneumonia with acute hypoxic respiratory failure   A/p 1)Sepsis secondary to CAP----POA -- Treated with iv  Rocephin and azithromycin, bronchodilators and mucolytics  -Okay to discharge home on Omnicef and doxycycline ---Cough improved  significantly- -dyspnea and hypoxia resolved --Patient ambulated about 100 feet .Marland Kitchen  Without significant dyspnea or hypoxia O2 sats post ambulation 90 to 93% on room air -Treated with IV Solu-Medrol okay to discharge home on oral prednisone -WBC trending down -There was suspicion of possible UTI initially on admission, but urine culture is negative   2)Arrhythmia concerns--Concerns about nonsustained V. tach on 04/13/23 (no further significant arrhythmia concerns --please see documentation from LPN Ms. Deetta Perla -Patient denies chest pains, or  palpitations or dizziness -EKG sinus rhythm largely unchanged from prior Troponin 28 >> 25   3)DM2- -last A1c 6.0 reflecting excellent diabetic control PTA -Anticipate worsening hyperglycemia with steroids -Resume PTA diabetic regimen including metformin   4)H/o HTN--- hypotension noted -Serum a.m. cortisol is not low -May restart PTA BP meds as advised including amlodipine and olmesartan   5)COPD--continue bronchodilators -Treated with IV Solu-Medrol okay to discharge home on p.o. prednisone   6) acute hypoxic respiratory failure--- secondary to #1 above/pneumonia -Dyspnea and hypoxia resolved patient ambulating on room air without desaturation -Management as above in #1   7)AKI----acute kidney injury   -Baseline creatinine 1.0 to1.2 -Admission creatinine 1.64 -Creatinine down to 1.19   8)OSA--- CPAP nightly advised   9)Morbid Obesity- -Low calorie diet, portion control and increase physical activity discussed with patient -Body mass index is 52.89 kg/m.  Disposition: The patient is from: Home              Anticipated d/c is to: Home---  Discharge Condition: Stable  Follow UP   Follow-up Information     Jason Olszewski, MD Follow up in 1 week(s).   Specialty: Internal Medicine Contact information: [address] [place] [address] [phone]                 Diet and Activity recommendation:  As  advised  Discharge Instructions    Discharge Instructions     Call MD for:  difficulty breathing, headache or visual disturbances   Complete by: As directed    Call MD for:  persistant dizziness or light-headedness   Complete by: As directed    Call MD for:  persistant nausea and vomiting   Complete by: As directed    Call MD for:  temperature >100.4   Complete by: As directed    Diet - low sodium heart healthy   Complete by: As directed    Discharge instructions   Complete by: As directed    1)Continue to Use CPAP every night 2)follow up with Jason Olszewski, MD (Primary Care Physician) in 1 to 2 weeks   Increase activity slowly   Complete by: As directed        Discharge Medications     Allergies as of 04/17/2023  Reactions   Penicillins Swelling        Medication List     STOP taking these medications    cephALEXin 500 MG capsule Commonly known as: KEFLEX   clotrimazole-betamethasone cream Commonly known as: LOTRISONE   hydrALAZINE 50 MG tablet Commonly known as: APRESOLINE       TAKE these medications    acetaminophen 325 MG tablet Commonly known as: TYLENOL Take 2 tablets (650 mg total) by mouth every 6 (six) hours as needed for mild pain (or Fever >/= 101).   albuterol 108 (90 Base) MCG/ACT inhaler Commonly known as: VENTOLIN HFA Inhale 2 puffs into the lungs every 6 (six) hours as needed for wheezing or shortness of breath.   albuterol (2.5 MG/3ML) 0.083% nebulizer solution Commonly known as: PROVENTIL Take 3 mLs (2.5 mg total) by nebulization every 6 (six) hours as needed for wheezing or shortness of breath.   amLODipine 10 MG tablet Commonly known as: NORVASC Take 1 tablet (10 mg total) by mouth daily.   bisoprolol 10 MG tablet Commonly known as: ZEBETA Take 2 tablets (20 mg total) by mouth daily.   buPROPion 150 MG 24 hr tablet Commonly known as: WELLBUTRIN XL Take 150 mg by mouth every morning.   cefdinir 300 MG  capsule Commonly known as: OMNICEF Take 1 capsule (300 mg total) by mouth 2 (two) times daily for 5 days.   celecoxib 200 MG capsule Commonly known as: CeleBREX Take 1 capsule (200 mg total) by mouth daily. Replaces meloxicam What changed: when to take this   doxycycline 100 MG tablet Commonly known as: VIBRA-TABS Take 1 tablet (100 mg total) by mouth 2 (two) times daily for 5 days.   fluticasone 50 MCG/ACT nasal spray Commonly known as: FLONASE Place 2 sprays into both nostrils daily.   fluticasone furoate-vilanterol 100-25 MCG/ACT Aepb Commonly known as: Breo Ellipta Inhale 1 puff into the lungs daily. What changed: See the new instructions.   furosemide 40 MG tablet Commonly known as: LASIX Take 1 tablet (40 mg total) by mouth daily. Take extra 40 mg for 3 pound weight gain in 24 hours, or swelling or shortness of breath   metFORMIN 1000 MG tablet Commonly known as: GLUCOPHAGE Take 1 tablet (1,000 mg total) by mouth 2 (two) times daily with a meal. What changed: See the new instructions.   olmesartan 40 MG tablet Commonly known as: BENICAR Take 1 tablet (40 mg total) by mouth daily.   pantoprazole 40 MG tablet Commonly known as: Protonix Take 1 tablet (40 mg total) by mouth daily.   potassium chloride 10 MEQ CR capsule Commonly known as: MICRO-K TAKE 1 CAPSULE BY MOUTH EVERY DAY   Praluent 150 MG/ML Soaj Generic drug: Alirocumab Inject 150 mg into the skin every 14 (fourteen) days.   predniSONE 20 MG tablet Commonly known as: DELTASONE Take 2 tablets (40 mg total) by mouth daily with breakfast for 5 days.   rosuvastatin 40 MG tablet Commonly known as: CRESTOR Take 1 tablet (40 mg total) by mouth daily. Replaces atorvastatin which failed to reach Low Density Lipoprotein (LDL cholesterol) goal of 55, take only if repatha not approved/available.   sildenafil 20 MG tablet Commonly known as: Revatio 2-5 pills once daily as needed for erectile dysfunction    tirzepatide 7.5 MG/0.5ML Pen Commonly known as: MOUNJARO Inject 7.5 mg into the skin once a week. Replaces 5 mg dose.       Major procedures and Radiology Reports - PLEASE review detailed and  final reports for all details, in brief -   CT Angio Chest PE W and/or Wo Contrast  Result Date: 04/12/2023 CLINICAL DATA:  Cough fever EXAM: CT ANGIOGRAPHY CHEST WITH CONTRAST TECHNIQUE: Multidetector CT imaging of the chest was performed using the standard protocol during bolus administration of intravenous contrast. Multiplanar CT image reconstructions and MIPs were obtained to evaluate the vascular anatomy. RADIATION DOSE REDUCTION: This exam was performed according to the departmental dose-optimization program which includes automated exposure control, adjustment of the mA and/or kV according to patient size and/or use of iterative reconstruction technique. CONTRAST:  OMNIPAQUE IOHEXOL 350 MG/ML SOLN COMPARISON:  Chest xray 04/12/2023, CT chest 07/30/2022 FINDINGS: Cardiovascular: Satisfactory opacification of the pulmonary arteries to the segmental level. No evidence of pulmonary embolism. Normal heart size. No pericardial effusion.Nonaneurysmal aorta. Mediastinum/Nodes: Stable to slight increased mediastinal adenopathy for example subcarinal node measures 2.2 cm compared with 1.8 cm previously. Thyroid gland, trachea, and esophagus demonstrate no significant findings. Lungs/Pleura: Heterogenous consolidations in the left greater than right lower lobes. No pleural effusion. No pneumothorax Upper Abdomen: No acute abnormality. Musculoskeletal: No chest wall abnormality. No acute or significant osseous findings. Review of the MIP images confirms the above findings. IMPRESSION: 1. Negative for acute pulmonary embolus. 2. Heterogenous consolidations in the left greater than right lower lobes consistent with pneumonia. 3. Stable to slight increased mediastinal adenopathy, likely reactive. Electronically Signed    By: Jasmine Pang M.D.   On: 04/12/2023 02:54   DG Chest Port 1 View  Result Date: 04/12/2023 CLINICAL DATA:  Cough, chills, fever, congestion. EXAM: PORTABLE CHEST 1 VIEW COMPARISON:  Chest radiographs and CT 07/30/2022 FINDINGS: Stable cardiomediastinal silhouette. Bibasilar atelectasis or infiltrates. No pleural effusion or pneumothorax. No displaced rib fractures. IMPRESSION: Bibasilar atelectasis or infiltrates. Electronically Signed   By: Minerva Fester M.D.   On: 04/12/2023 00:51    Micro Results  Recent Results (from the past 240 hour(s))  Blood Culture (routine x 2)     Status: None   Collection Time: 04/12/23 12:34 AM   Specimen: BLOOD RIGHT FOREARM  Result Value Ref Range Status   Specimen Description BLOOD RIGHT FOREARM  Final   Special Requests   Final    BOTTLES DRAWN AEROBIC AND ANAEROBIC Blood Culture adequate volume   Culture   Final    NO GROWTH 5 DAYS Performed at [facility], [address] [place], [place] [address]    Report Status 04/17/2023 FINAL  Final  Resp panel by RT-PCR (RSV, Flu A&B, Covid) Anterior Nasal Swab     Status: None   Collection Time: 04/12/23 12:36 AM   Specimen: Anterior Nasal Swab  Result Value Ref Range Status   SARS Coronavirus 2 by RT PCR NEGATIVE NEGATIVE Final    Comment: (NOTE) SARS-CoV-2 target nucleic acids are NOT DETECTED.  The SARS-CoV-2 RNA is generally detectable in upper respiratory specimens during the acute phase of infection. The lowest concentration of SARS-CoV-2 viral copies this assay can detect is 138 copies/mL. A negative result does not preclude SARS-Cov-2 infection and should not be used as the sole basis for treatment or other patient management decisions. A negative result may occur with  improper specimen collection/handling, submission of specimen other than nasopharyngeal swab, presence of viral mutation(s) within the areas targeted by this assay, and inadequate number of viral copies(<138 copies/mL). A  negative result must be combined with clinical observations, patient history, and epidemiological information. The expected result is Negative.  Fact Sheet for Patients:  BloggerCourse.com  Fact Sheet for Healthcare Providers:  SeriousBroker.it  This test is no t yet approved or cleared by the [place] FDA and  has been authorized for detection and/or diagnosis of SARS-CoV-2 by FDA under an Emergency Use Authorization (EUA). This EUA will remain  in effect (meaning this test can be used) for the duration of the COVID-19 declaration under Section 564(b)(1) of the Act, 21 U.S.C.section 360bbb-3(b)(1), unless the authorization is terminated  or revoked sooner.       Influenza A by PCR NEGATIVE NEGATIVE Final   Influenza B by PCR NEGATIVE NEGATIVE Final    Comment: (NOTE) The Xpert Xpress SARS-CoV-2/FLU/RSV plus assay is intended as an aid in the diagnosis of influenza from Nasopharyngeal swab specimens and should not be used as a sole basis for treatment. Nasal washings and aspirates are unacceptable for Xpert Xpress SARS-CoV-2/FLU/RSV testing.  Fact Sheet for Patients: BloggerCourse.com  Fact Sheet for Healthcare Providers: SeriousBroker.it  This test is not yet approved or cleared by the [place] FDA and has been authorized for detection and/or diagnosis of SARS-CoV-2 by FDA under an Emergency Use Authorization (EUA). This EUA will remain in effect (meaning this test can be used) for the duration of the COVID-19 declaration under Section 564(b)(1) of the Act, 21 U.S.C. section 360bbb-3(b)(1), unless the authorization is terminated or revoked.     Resp Syncytial Virus by PCR NEGATIVE NEGATIVE Final    Comment: (NOTE) Fact Sheet for Patients: BloggerCourse.com  Fact Sheet for Healthcare  Providers: SeriousBroker.it  This test is not yet approved or cleared by the [place] FDA and has been authorized for detection and/or diagnosis of SARS-CoV-2 by FDA under an Emergency Use Authorization (EUA). This EUA will remain in effect (meaning this test can be used) for the duration of the COVID-19 declaration under Section 564(b)(1) of the Act, 21 U.S.C. section 360bbb-3(b)(1), unless the authorization is terminated or revoked.  Performed at [facility], [address] [place], [place] [address]   Blood Culture (routine x 2)     Status: None   Collection Time: 04/12/23 12:50 AM   Specimen: BLOOD LEFT ARM  Result Value Ref Range Status   Specimen Description BLOOD LEFT ARM  Final   Special Requests   Final    BOTTLES DRAWN AEROBIC AND ANAEROBIC Blood Culture adequate volume   Culture   Final    NO GROWTH 5 DAYS Performed at [facility], [address] [place], [place] [address]    Report Status 04/17/2023 FINAL  Final  MRSA Next Gen by PCR, Nasal     Status: None   Collection Time: 04/12/23 12:16 PM   Specimen: Nasal Mucosa; Nasal Swab  Result Value Ref Range Status   MRSA by PCR Next Gen NOT DETECTED NOT DETECTED Final    Comment: (NOTE) The GeneXpert MRSA Assay (FDA approved for NASAL specimens only), is one component of a comprehensive MRSA colonization surveillance program. It is not intended to diagnose MRSA infection nor to guide or monitor treatment for MRSA infections. Test performance is not FDA approved in patients less than 55 years old. Performed at [facility], [address] [place] [address], [place], [place] [address]   Urine Culture     Status: None   Collection Time: 04/12/23  1:25 PM   Specimen: Urine, Clean Catch  Result Value Ref Range Status   Specimen Description   Final    URINE, CLEAN CATCH Performed at [facility], [address], [place], [place]  [address]    Special Requests   Final    NONE Reflexed from  [id number] Performed at [facility], [address] [place], [place] [address]    Culture   Final    NO GROWTH Performed at [facility], [address] [place], [place] [address]    Report Status 04/13/2023 FINAL  Final    Today   Subjective    Keyan Gibby today has no new complaints -Ambulating on room air without significant desaturation or dyspnea on exertion No fever  Or chills   Patient has been seen and examined prior to discharge   Objective   Blood pressure (!) 115/48, pulse 71, temperature (!) 97.5 F (36.4 C), temperature source Oral, resp. rate 20, height 5\' 11"  (1.803 m), weight (!) 172 kg, SpO2 94 %.   Intake/Output Summary (Last 24 hours) at 04/17/2023 1015 Last data filed at 04/17/2023 0457 Gross per 24 hour  Intake 240 ml  Output 700 ml  Net -460 ml    Exam Gen:- Awake Alert, no acute distress , speaking in complete sentences HEENT:- [place].AT, No sclera icterus Neck-Supple Neck,No JVD,.  Lungs-improved air movement, no wheezing no rales no rhonchi CV- S1, S2 normal, regular Abd-  +ve B.Sounds, Abd Soft, No tenderness,    Extremity/Skin:- No  edema,   good pulses Psych-affect is appropriate, oriented x3 Neuro-no new focal deficits, no tremors    Data Review   CBC w Diff:  Lab Results  Component Value Date   WBC 11.8 (H) 04/15/2023   HGB 13.6 04/15/2023   HGB 14.7 03/29/2022   HCT 42.7 04/15/2023   HCT 45.3 03/29/2022   PLT 236 04/15/2023   PLT 310 03/29/2022   LYMPHOPCT 4 04/12/2023   MONOPCT 5 04/12/2023   EOSPCT 0 04/12/2023   BASOPCT 0 04/12/2023   CMP:  Lab Results  Component Value Date   NA 136 04/15/2023   NA 146 (H) 03/29/2022   K 4.0 04/15/2023   CL 105 04/15/2023   CO2 23 04/15/2023   BUN 13 04/15/2023   BUN 12 03/29/2022   CREATININE 1.19 04/15/2023   PROT 6.8 04/13/2023   PROT 6.9 03/29/2022   ALBUMIN 2.9 (L) 04/13/2023   ALBUMIN 4.3 03/29/2022   BILITOT 0.9 04/13/2023   BILITOT 0.4 03/29/2022   ALKPHOS 90  04/13/2023   AST 15 04/13/2023   ALT 33 04/13/2023  .  Total Discharge time is about 33 minutes  Shon Hale M.D on 04/17/2023 at 10:15 AM  Go to [email or web address] -  for contact info  [facility] - Office  [phone]

## 2023-04-18 ENCOUNTER — Telehealth: Payer: Self-pay

## 2023-04-18 NOTE — Transitions of Care (Post Inpatient/ED Visit) (Signed)
 04/18/2023  Name: Jason Meyer MRN: [id number] DOB: [birth year]  Today's TOC FU Call Status: Today's TOC FU Call Status:: Successful TOC FU Call Competed TOC FU Call Complete Date: 04/18/23  Transition Care Management Follow-up Telephone Call Date of Discharge: 04/17/23 [facility] Type of Discharge: Inpatient Admission Primary Inpatient Discharge Diagnosis:: sepsis secondary to pneumonia How have you been since you were released from the hospital?: Better Any questions or concerns?: No  Items Reviewed: Did you receive and understand the discharge instructions provided?: Yes Medications obtained,verified, and reconciled?: Yes (Medications Reviewed) Any new allergies since your discharge?: No Dietary orders reviewed?: Yes Type of Diet Ordered:: heart healthy Do you have support at home?: Yes People in Home: spouse  Medications Reviewed Today: Medications Reviewed Today     Reviewed by Raelyn Number, CMA (Certified Medical Assistant) on 04/18/23 at [phone]  Med List Status: <None>   Medication Order Taking? Sig Documenting Provider Last Dose Status Informant  acetaminophen (TYLENOL) 325 MG tablet [id number] Yes Take 2 tablets (650 mg total) by mouth every 6 (six) hours as needed for mild pain (or Fever >/= 101). Shon Hale, MD Taking Active   albuterol (PROVENTIL) (2.5 MG/3ML) 0.083% nebulizer solution [id number] Yes Take 3 mLs (2.5 mg total) by nebulization every 6 (six) hours as needed for wheezing or shortness of breath. Shon Hale, MD Taking Active   albuterol (VENTOLIN HFA) 108 (90 Base) MCG/ACT inhaler [id number] Yes Inhale 2 puffs into the lungs every 6 (six) hours as needed for wheezing or shortness of breath. Shon Hale, MD Taking Active   Alirocumab (PRALUENT) 150 MG/ML Ivory Broad [id number] Yes Inject 150 mg into the skin every 14 (fourteen) days. [name], MD Taking Active Self  amLODipine (NORVASC) 10 MG tablet [id number] Yes  Take 1 tablet (10 mg total) by mouth daily. Shon Hale, MD Taking Active   bisoprolol (ZEBETA) 10 MG tablet [id number] Yes Take 2 tablets (20 mg total) by mouth daily. Shon Hale, MD Taking Active   buPROPion (WELLBUTRIN XL) 150 MG 24 hr tablet [id number] Yes Take 150 mg by mouth every morning. [provider] Taking Active Self  cefdinir (OMNICEF) 300 MG capsule [id number] Yes Take 1 capsule (300 mg total) by mouth 2 (two) times daily for 5 days. Shon Hale, MD Taking Active   celecoxib (CELEBREX) 200 MG capsule [id number] Yes Take 1 capsule (200 mg total) by mouth daily. Replaces meloxicam Shon Hale, MD Taking Active   doxycycline (VIBRA-TABS) 100 MG tablet [id number] Yes Take 1 tablet (100 mg total) by mouth 2 (two) times daily for 5 days. Shon Hale, MD Taking Active   fluticasone (FLONASE) 50 MCG/ACT nasal spray [id number] Yes Place 2 sprays into both nostrils daily. Dettinger, Elige Radon, MD Taking Active Self, Spouse/Significant Other  fluticasone furoate-vilanterol (BREO ELLIPTA) 100-25 MCG/ACT AEPB [id number] Yes Inhale 1 puff into the lungs daily. Shon Hale, MD Taking Active   furosemide (LASIX) 40 MG tablet [id number] Yes Take 1 tablet (40 mg total) by mouth daily. Take extra 40 mg for 3 pound weight gain in 24 hours, or swelling or shortness of breath Emokpae, Courage, MD Taking Active   metFORMIN (GLUCOPHAGE) 1000 MG tablet [id number] Yes Take 1 tablet (1,000 mg total) by mouth 2 (two) times daily with a meal. Emokpae, Courage, MD Taking Active   olmesartan (BENICAR) 40 MG tablet [id number] Yes Take 1 tablet (40 mg total) by mouth daily. Shon Hale, MD Taking Active   pantoprazole (PROTONIX) 40 MG tablet  [id number] Yes Take 1 tablet (40 mg total) by mouth daily. Shon Hale, MD Taking Active   potassium chloride (MICRO-K) 10 MEQ CR capsule [id number] Yes TAKE 1 CAPSULE BY MOUTH EVERY DAY [name], MD Taking Active Self  predniSONE  (DELTASONE) 20 MG tablet [id number] Yes Take 2 tablets (40 mg total) by mouth daily with breakfast for 5 days. Shon Hale, MD Taking Active   rosuvastatin (CRESTOR) 40 MG tablet [id number] Yes Take 1 tablet (40 mg total) by mouth daily. Replaces atorvastatin which failed to reach Low Density Lipoprotein (LDL cholesterol) goal of 55, take only if repatha not approved/available. Shon Hale, MD Taking Active   sildenafil (REVATIO) 20 MG tablet [id number] Yes 2-5 pills once daily as needed for erectile dysfunction Dettinger, Elige Radon, MD Taking Active Self, Spouse/Significant Other  tirzepatide [facility]) 7.5 MG/0.5ML Pen [id number] Yes Inject 7.5 mg into the skin once a week. Replaces 5 mg dose. Shon Hale, MD Taking Active             Home Care and Equipment/Supplies: Were Home Health Services Ordered?: NA Any new equipment or medical supplies ordered?: NA  Functional Questionnaire: Do you need assistance with bathing/showering or dressing?: No Do you need assistance with meal preparation?: No Do you need assistance with eating?: No Do you have difficulty maintaining continence: No Do you need assistance with getting out of bed/getting out of a chair/moving?: No Do you have difficulty managing or taking your medications?: No  Follow up appointments reviewed: PCP Follow-up appointment confirmed?: Yes Date of PCP follow-up appointment?: 04/25/23 Follow-up Provider: Dr[facility] Follow-up appointment confirmed?: NA Do you need transportation to your follow-up appointment?: No Do you understand care options if your condition(s) worsen?: Yes-patient verbalized understanding    SIGNATURE  Emmagene Ortner, CMA  [facility] AWV Team

## 2023-04-25 ENCOUNTER — Encounter: Payer: Self-pay | Admitting: Internal Medicine

## 2023-04-25 ENCOUNTER — Ambulatory Visit (INDEPENDENT_AMBULATORY_CARE_PROVIDER_SITE_OTHER): Payer: 59 | Admitting: Internal Medicine

## 2023-04-25 VITALS — BP 133/67 | HR 87 | Temp 97.7°F | Ht 71.0 in | Wt 364.0 lb

## 2023-04-25 DIAGNOSIS — R110 Nausea: Secondary | ICD-10-CM | POA: Diagnosis not present

## 2023-04-25 DIAGNOSIS — J189 Pneumonia, unspecified organism: Secondary | ICD-10-CM | POA: Diagnosis not present

## 2023-04-25 DIAGNOSIS — N179 Acute kidney failure, unspecified: Secondary | ICD-10-CM

## 2023-04-25 DIAGNOSIS — E1169 Type 2 diabetes mellitus with other specified complication: Secondary | ICD-10-CM

## 2023-04-25 DIAGNOSIS — Z23 Encounter for immunization: Secondary | ICD-10-CM

## 2023-04-25 MED ORDER — ONDANSETRON 4 MG PO TBDP
4.0000 mg | ORAL_TABLET | Freq: Three times a day (TID) | ORAL | 0 refills | Status: DC | PRN
Start: 2023-04-25 — End: 2023-11-14

## 2023-04-25 MED ORDER — TIRZEPATIDE 5 MG/0.5ML ~~LOC~~ SOAJ
5.0000 mg | SUBCUTANEOUS | 3 refills | Status: DC
Start: 2023-04-25 — End: 2023-09-07

## 2023-04-25 MED ORDER — TIRZEPATIDE 7.5 MG/0.5ML ~~LOC~~ SOAJ: 7.5000 mg | SUBCUTANEOUS | 5 refills | Status: DC

## 2023-04-25 NOTE — Progress Notes (Unsigned)
Phone: [phone] Healthcare Provider: [name], MD    Chief Complaint:  Jason Meyer is a 62 y.o. male who presents for transitional care management (TCM) hospital follow-up.  Admission date:  04/12/2023   Admitting Physician  Shon Hale, MD Discharge Date:  04/17/2023  [facility] FU Call Complete Date: 04/18/23  Date of Discharge: 04/17/23 Dis[facility]: [facility] Type of Discharge: Inpatient Admission Primary Inpatient Discharge Diagnosis:: sepsis secondary to pneumonia  In order to minimize the risk of readmission: I reviewed the hospital course with the patient, reemphasizing and further educating him about his hospital diagnoses, hospital course and treatment plans, and modified dc summary to reflect changes in his condition since the hospital. Resummary of discharge summary, which was reviewed with him:   A/p 1)Sepsis secondary to CAP----POA -- Treated with IV Rocephin and azithromycin, bronchodilators and mucolytics -Okay to discharge home on Omnicef and doxycycline and prednisone-  he completed the courses 3 days ago.     2)Arrhythmia concerns--Concerns about nonsustained V. tach on 04/13/23 (no further significant arrhythmia concerns- patient never had palpitations and still hasn't seen cardiologist in a year --please see documentation from LPN Ms. Deetta Perla -Patient denies chest pains, or  palpitations or dizziness -EKG sinus rhythm largely unchanged from prior Troponin 28 >> 25 at hospital    4)H/o HTN--- hypotension noted blood pressure medication(s) held but have been restarted and blood pressure stable today   5)COPD--continue bronchodilators -Treated with IV Solu-Medrol okay to discharge home on p.o. 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 SpO2: 95 % on room air today   7)AKI----acute kidney injury   -Baseline creatinine 1.0 to 1.2 -Admission creatinine 1.6:  Lab Results  Component Value Date/Time   CREATININE 1.19 04/15/2023 05:17 AM   CREATININE 1.28 (H) 04/14/2023 05:22 AM   CREATININE 1.45 (H) 04/13/2023 11:25 PM   CREATININE 1.45 (H) 04/13/2023 05:02 AM   CREATININE 1.64 (H) 04/12/2023 12:34 AM      8)OSA--- CPAP nightly he reports compliant with CPAP  since leaving hospital   9)Morbid Obesity- trying to tolerate Mounjaro.  {Vanishing text: A TCM phone call was completed within 48 hours of discharge. Appointment  within 7 days with 2/3 high medical decision making for [phone]  or 14d  f [phone] with level 4 ) days of discharge.  Only one TCM visit is to be billed within a  30 day interval.  [employer] won't pay for Transitional Care Management (TCM) billing codes 1}  Assessment/Plan:   Nausea -     Ondansetron; Take 1 tablet (4 mg total) by mouth every 8 (eight) hours as needed for nausea or vomiting.  Dispense: 20 tablet; Refill: 0 -     Basic metabolic panel; Future  Type 2 diabetes mellitus with other specified complication, without long-term current use of insulin (HCC) -     Tirzepatide; Inject 5 mg into the skin once a week.  Dispense: 6 mL; Refill: 3 -     Tirzepatide; Inject 7.5 mg into the skin once a week. Replaces 5 mg dose.  Dispense: 6 mL; Refill: 5 -     Basic metabolic panel; Future  Community acquired pneumonia, unspecified laterality -  DG Chest 2 View; Future -     Basic metabolic panel; Future  AKI (acute kidney injury) (HCC)  Need for pneumococcal vaccination -     Pneumococcal conjugate vaccine 20-valent      Subjective:      How have you been since you were released from the hospital, since finishing antibiotic(s), and talking to Transitional Care Management (TCM) phone  cal: all better nos x  Any questions or concerns?: No  Medications obtained,verified, and reconciled?: Yes done with antibiotic(s) and steroid and back on all prehospital medications except some issues with Mounjaro Any new allergies since your discharge?: No Tolerating full diet, but not Mounjaro  People in Home: spouse  Home Care and Equipment/Supplies: are not needed and weren't ordered Were Home Health Services Ordered?: NA Any new equipment or medical supplies ordered?: NA   Functional Questionnaire: Do you need assistance with bathing/showering or dressing?: No Do you need assistance with meal preparation?: No Do you need assistance with eating?: No Do you have difficulty maintaining continence: No Do you need assistance with getting out of bed/getting out of a chair/moving?: No Do you have difficulty managing or taking your medications?: No    HPI       I reconciled medications to meet the medication reconciliation requirement - I attest that I have reviewed and confirmed medicines:   Outpatient Encounter Medications as of 04/25/2023  Medication Sig   acetaminophen (TYLENOL) 325 MG tablet Take 2 tablets (650 mg total) by mouth every 6 (six) hours as needed for mild pain (or Fever >/= 101).   albuterol (PROVENTIL) (2.5 MG/3ML) 0.083% nebulizer solution Take 3 mLs (2.5 mg total) by nebulization every 6 (six) hours as needed for wheezing or shortness of breath.   albuterol (VENTOLIN HFA) 108 (90 Base) MCG/ACT inhaler Inhale 2 puffs into the lungs every 6 (six) hours as needed for wheezing or shortness of breath.   Alirocumab (PRALUENT) 150 MG/ML SOAJ Inject 150 mg into the skin every 14 (fourteen) days.   amLODipine (NORVASC) 10 MG tablet Take 1 tablet (10 mg total) by mouth daily.   bisoprolol (ZEBETA) 10 MG tablet Take 2 tablets (20 mg total) by mouth daily.   buPROPion (WELLBUTRIN XL) 150 MG 24 hr tablet Take 150 mg by mouth every morning.   celecoxib (CELEBREX) 200 MG  capsule Take 1 capsule (200 mg total) by mouth daily. Replaces meloxicam   fluticasone (FLONASE) 50 MCG/ACT nasal spray Place 2 sprays into both nostrils daily.   fluticasone furoate-vilanterol (BREO ELLIPTA) 100-25 MCG/ACT AEPB Inhale 1 puff into the lungs daily.   furosemide (LASIX) 40 MG tablet Take 1 tablet (40 mg total) by mouth daily. Take extra 40 mg for 3 pound weight gain in 24 hours, or swelling or shortness of breath   metFORMIN (GLUCOPHAGE) 1000 MG tablet Take 1 tablet (1,000 mg total) by mouth 2 (two) times daily with a meal.   olmesartan (BENICAR) 40 MG tablet Take 1 tablet (40 mg total) by mouth daily.   ondansetron (ZOFRAN-ODT) 4 MG disintegrating tablet Take 1 tablet (4 mg total) by mouth every 8 (eight) hours as needed for nausea or vomiting.   pantoprazole (PROTONIX) 40 MG tablet Take 1 tablet (40 mg total) by mouth daily.   potassium chloride (MICRO-K) 10 MEQ CR capsule TAKE 1 CAPSULE BY MOUTH EVERY DAY   rosuvastatin (CRESTOR) 40 MG tablet Take 1 tablet (40 mg total) by mouth daily. Replaces atorvastatin which failed to reach Low Density Lipoprotein (LDL  cholesterol) goal of 55, take only if repatha not approved/available.   sildenafil (REVATIO) 20 MG tablet 2-5 pills once daily as needed for erectile dysfunction   tirzepatide (MOUNJARO) 5 MG/0.5ML Pen Inject 5 mg into the skin once a week.   [DISCONTINUED] tirzepatide (MOUNJARO) 7.5 MG/0.5ML Pen Inject 7.5 mg into the skin once a week. Replaces 5 mg dose.   tirzepatide (MOUNJARO) 7.5 MG/0.5ML Pen Inject 7.5 mg into the skin once a week. Replaces 5 mg dose.   [DISCONTINUED] cefdinir (OMNICEF) 300 MG capsule Take 300 mg by mouth 2 (two) times daily.   [DISCONTINUED] doxycycline (ADOXA) 100 MG tablet Take 100 mg by mouth 2 (two) times daily.   No facility-administered encounter medications on file as of 04/25/2023.     I updated his problem list and collected interim history   No problems updated.  Interim History:   He  reports that since leaving the hospital, he has done well. He  doeshave a good understanding of his diagnoses and medical plans from the hospital, and his discharge instructions He  reports he has been eating well.   Activities of daily living and IADL independence vs requires assistance survey: good  Wife says he is fully back to his grumpy old self when asked about if any self care assistance is needed and he concurs, thus he has not been requiring assistance with bathing and showering personal hygiene and grooming dressing toilet hygiene functional mobility / transferring and walking or self - feeding and does NOT have any issues or assistance needs for transportation   He has been staying at/with home with spouse.   Regarding referrals, he knows about:     No specific referral made by hospital. Current Providers as of 04/25/2023 PCP: [name], MD Care Team Provider: Jonelle Sidle, MD Care Team Provider: Dettinger, Elige Radon, MD Care Team Provider: Vickki Hearing, MD Encounter Provider: [name], MD, starting on Wed Apr 25, 2023 12:00 AM Referring Provider: [name], MD, starting on Wed Apr 25, 2023 12:00 AM Attending Provider: [name], MD, starting on Wed Apr 18, 2023  9:42 AM (Active) Consulting Physician: [name], MD, starting on Wed Apr 25, 2023 11:33 AM (Active)    ROS negative unless otherwise stated.       Objective:   Physical Exam: BP 133/67 (BP Location: Right Arm, Patient Position: Sitting)   Pulse 87   Temp 97.7 F (36.5 C) (Temporal)   Ht 5\' 11"  (1.803 m)   Wt (!) 364 lb (165.1 kg)   SpO2 95%   BMI 50.77 kg/m   Body mass index is 50.77 kg/m.  Gen: NAD, resting comfortably psych: Normal affect and thought content Cardiopulm: normal work of breathing at rest   Relevant labs/imaging from hospitalization reviewed: Lab Results  Component Value Date/Time   CREATININE 1.19 04/15/2023 05:17 AM   CREATININE 1.28  (H) 04/14/2023 05:22 AM   CREATININE 1.45 (H) 04/13/2023 11:25 PM   CREATININE 1.45 (H) 04/13/2023 05:02 AM   CREATININE 1.64 (H) 04/12/2023 12:34 AM    Recent Results (from the past 2160 hour(s))  Lactic acid, plasma     Status: None   Collection Time: 04/12/23 12:34 AM  Result Value Ref Range   Lactic Acid, Venous 1.7 0.5 - 1.9 mmol/L    Comment: Performed at [facility], [address], [place], [place] [address]  Comprehensive metabolic panel     Status: Abnormal   Collection Time: 04/12/23  12:34 AM  Result Value Ref Range   Sodium 135 135 - 145 mmol/L   Potassium 4.0 3.5 - 5.1 mmol/L   Chloride 102 98 - 111 mmol/L   CO2 23 22 - 32 mmol/L   Glucose, Bld 187 (H) 70 - 99 mg/dL    Comment: Glucose reference range applies only to samples taken after fasting for at least 8 hours.   BUN 15 6 - 20 mg/dL   Creatinine, Ser 1.61 (H) 0.61 - 1.24 mg/dL   Calcium 8.9 8.9 - 09.6 mg/dL   Total Protein 8.0 6.5 - 8.1 g/dL   Albumin 3.6 3.5 - 5.0 g/dL   AST 20 15 - 41 U/L   ALT 59 (H) 0 - 44 U/L   Alkaline Phosphatase 120 38 - 126 U/L   Total Bilirubin 1.1 0.3 - 1.2 mg/dL   GFR, Estimated 48 (L) >60 mL/min    Comment: (NOTE) Calculated using the CKD-EPI Creatinine Equation (2021)    Anion gap 10 5 - 15    Comment: Performed at [facility], [address] [place], [place] [address]  CBC with Differential     Status: Abnormal   Collection Time: 04/12/23 12:34 AM  Result Value Ref Range   WBC 20.4 (H) 4.0 - 10.5 K/uL   RBC 6.21 (H) 4.22 - 5.81 MIL/uL   Hemoglobin 15.2 13.0 - 17.0 g/dL   HCT 98.1 19.1 - 47.8 %   MCV 77.5 (L) 80.0 - 100.0 fL   MCH 24.5 (L) 26.0 - 34.0 pg   MCHC 31.6 30.0 - 36.0 g/dL   RDW 29.5 (H) 62.1 - 30.8 %   Platelets 216 150 - 400 K/uL   nRBC 0.0 0.0 - 0.2 %   Neutrophils Relative % 90 %   Neutro Abs 18.4 (H) 1.7 - 7.7 K/uL   Lymphocytes Relative 4 %   Lymphs Abs 0.8 0.7 - 4.0 K/uL   Monocytes Relative 5 %   Monocytes Absolute 0.9 0.1 - 1.0 K/uL    Eosinophils Relative 0 %   Eosinophils Absolute 0.1 0.0 - 0.5 K/uL   Basophils Relative 0 %   Basophils Absolute 0.1 0.0 - 0.1 K/uL   Immature Granulocytes 1 %   Abs Immature Granulocytes 0.14 (H) 0.00 - 0.07 K/uL    Comment: Performed at [facility], [address], [place], [place] [address]  Protime-INR     Status: None   Collection Time: 04/12/23 12:34 AM  Result Value Ref Range   Prothrombin Time 15.2 11.4 - 15.2 seconds   INR 1.2 0.8 - 1.2    Comment: (NOTE) INR goal varies based on device and disease states. Performed at [facility], [address] [place], [place] [address]   APTT     Status: None   Collection Time: 04/12/23 12:34 AM  Result Value Ref Range   aPTT 33 24 - 36 seconds    Comment: Performed at [facility], [address] [place], [place] [address]  Blood Culture (routine x 2)     Status: None   Collection Time: 04/12/23 12:34 AM   Specimen: BLOOD RIGHT FOREARM  Result Value Ref Range   Specimen Description BLOOD RIGHT FOREARM    Special Requests      BOTTLES DRAWN AEROBIC AND ANAEROBIC Blood Culture adequate volume   Culture      NO GROWTH 5 DAYS Performed at [facility], [address] [place], [place] [address]    Report Status 04/17/2023 FINAL  Resp panel by RT-PCR (RSV, Flu A&B, Covid) Anterior Nasal Swab     Status: None   Collection Time: 04/12/23 12:36 AM   Specimen: Anterior Nasal Swab  Result Value Ref Range   SARS Coronavirus 2 by RT PCR NEGATIVE NEGATIVE    Comment: (NOTE) SARS-CoV-2 target nucleic acids are NOT DETECTED.  The SARS-CoV-2 RNA is generally detectable in upper respiratory specimens during the acute phase of infection. The lowest concentration of SARS-CoV-2 viral copies this assay can detect is 138 copies/mL. A negative result does not preclude SARS-Cov-2 infection and should not be used as the sole basis for treatment or other patient management decisions. A negative result may occur with  improper specimen  collection/handling, submission of specimen other than nasopharyngeal swab, presence of viral mutation(s) within the areas targeted by this assay, and inadequate number of viral copies(<138 copies/mL). A negative result must be combined with clinical observations, patient history, and epidemiological information. The expected result is Negative.  Fact Sheet for Patients:  BloggerCourse.com  Fact Sheet for Healthcare Providers:  SeriousBroker.it  This test is no t yet approved or cleared by the [place] FDA and  has been authorized for detection and/or diagnosis of SARS-CoV-2 by FDA under an Emergency Use Authorization (EUA). This EUA will remain  in effect (meaning this test can be used) for the duration of the COVID-19 declaration under Section 564(b)(1) of the Act, 21 U.S.C.section 360bbb-3(b)(1), unless the authorization is terminated  or revoked sooner.       Influenza A by PCR NEGATIVE NEGATIVE   Influenza B by PCR NEGATIVE NEGATIVE    Comment: (NOTE) The Xpert Xpress SARS-CoV-2/FLU/RSV plus assay is intended as an aid in the diagnosis of influenza from Nasopharyngeal swab specimens and should not be used as a sole basis for treatment. Nasal washings and aspirates are unacceptable for Xpert Xpress SARS-CoV-2/FLU/RSV testing.  Fact Sheet for Patients: BloggerCourse.com  Fact Sheet for Healthcare Providers: SeriousBroker.it  This test is not yet approved or cleared by the [place] FDA and has been authorized for detection and/or diagnosis of SARS-CoV-2 by FDA under an Emergency Use Authorization (EUA). This EUA will remain in effect (meaning this test can be used) for the duration of the COVID-19 declaration under Section 564(b)(1) of the Act, 21 U.S.C. section 360bbb-3(b)(1), unless the authorization is terminated or revoked.     Resp Syncytial Virus by PCR  NEGATIVE NEGATIVE    Comment: (NOTE) Fact Sheet for Patients: BloggerCourse.com  Fact Sheet for Healthcare Providers: SeriousBroker.it  This test is not yet approved or cleared by the [place] FDA and has been authorized for detection and/or diagnosis of SARS-CoV-2 by FDA under an Emergency Use Authorization (EUA). This EUA will remain in effect (meaning this test can be used) for the duration of the COVID-19 declaration under Section 564(b)(1) of the Act, 21 U.S.C. section 360bbb-3(b)(1), unless the authorization is terminated or revoked.  Performed at [facility], [address], [place], [place] [address]   Blood Culture (routine x 2)     Status: None   Collection Time: 04/12/23 12:50 AM   Specimen: BLOOD LEFT ARM  Result Value Ref Range   Specimen Description BLOOD LEFT ARM    Special Requests      BOTTLES DRAWN AEROBIC AND ANAEROBIC Blood Culture adequate volume   Culture      NO GROWTH 5 DAYS Performed at [facility], [address] [place], [place] [address]    Report Status 04/17/2023 FINAL  Lactic acid, plasma     Status: None   Collection Time: 04/12/23  2:19 AM  Result Value Ref Range   Lactic Acid, Venous 1.3 0.5 - 1.9 mmol/L    Comment: Performed at [facility], [address] [place], [place] [address]  HIV Antibody (routine testing w rflx)     Status: None   Collection Time: 04/12/23  2:19 AM  Result Value Ref Range   HIV Screen 4th Generation wRfx Non Reactive Non Reactive    Comment: Performed at [facility], [address] [place], [place] [address]  Magnesium     Status: None   Collection Time: 04/12/23  2:19 AM  Result Value Ref Range   Magnesium 1.7 1.7 - 2.4 mg/dL    Comment: Performed at [facility], [address] [place], [place] [address]  Phosphorus     Status: Abnormal   Collection Time: 04/12/23  2:19 AM  Result Value Ref Range   Phosphorus 1.8 (L) 2.5 - 4.6 mg/dL     Comment: Performed at [facility], [address], [place], [place] [address]  Procalcitonin     Status: None   Collection Time: 04/12/23  2:19 AM  Result Value Ref Range   Procalcitonin 5.67 ng/mL    Comment:        Interpretation: PCT > 2 ng/mL: Systemic infection (sepsis) is likely, unless other causes are known. (NOTE)       Sepsis PCT Algorithm           Lower Respiratory Tract                                      Infection PCT Algorithm    ----------------------------     ----------------------------         PCT < 0.25 ng/mL                PCT < 0.10 ng/mL          Strongly encourage             Strongly discourage   discontinuation of antibiotics    initiation of antibiotics    ----------------------------     -----------------------------       PCT 0.25 - 0.50 ng/mL            PCT 0.10 - 0.25 ng/mL               OR       >80% decrease in PCT            Discourage initiation of                                            antibiotics      Encourage discontinuation           of antibiotics    ----------------------------     -----------------------------         PCT >= 0.50 ng/mL              PCT 0.26 - 0.50 ng/mL               AND       <80% decrease in PCT              Encourage initiation of  antibiotics       Encourage continuation           of antibiotics    ----------------------------     -----------------------------        PCT >= 0.50 ng/mL                  PCT > 0.50 ng/mL               AND         increase in PCT                  Strongly encourage                                      initiation of antibiotics    Strongly encourage escalation           of antibiotics                                     -----------------------------                                           PCT <= 0.25 ng/mL                                                 OR                                        > 80% decrease in PCT                                       Discontinue / Do not initiate                                             antibiotics  Performed at [facility], [address] [place], [place] [address]   Ferritin     Status: None   Collection Time: 04/12/23  2:19 AM  Result Value Ref Range   Ferritin 90 24 - 336 ng/mL    Comment: Performed at [facility], [address] [place], [place] [address]  Iron and TIBC     Status: Abnormal   Collection Time: 04/12/23  2:19 AM  Result Value Ref Range   Iron 8 (L) 45 - 182 ug/dL   TIBC 098 119 - 147 ug/dL   Saturation Ratios 3 (L) 17.9 - 39.5 %   UIBC 279 ug/dL    Comment: Performed at [facility], [address] [place], [place] [address]  Hemoglobin A1c     Status: Abnormal   Collection Time: 04/12/23  2:19 AM  Result Value Ref Range   Hgb A1c MFr Bld 6.0 (H) 4.8 - 5.6 %    Comment: (NOTE) Pre diabetes:  5.7%-6.4%  Diabetes:              >6.4%  Glycemic control for   <7.0% adults with diabetes    Mean Plasma Glucose 125.5 mg/dL    Comment: Performed at [facility], [address] [place], [place] [address]  Cortisol-am, blood     Status: Abnormal   Collection Time: 04/12/23  7:26 AM  Result Value Ref Range   Cortisol - AM 25.4 (H) 6.7 - 22.6 ug/dL    Comment: Performed at [facility], [address], [place], [place] [address]  CBG monitoring, ED     Status: Abnormal   Collection Time: 04/12/23  8:30 AM  Result Value Ref Range   Glucose-Capillary 169 (H) 70 - 99 mg/dL    Comment: Glucose reference range applies only to samples taken after fasting for at least 8 hours.  CBG monitoring, ED     Status: Abnormal   Collection Time: 04/12/23  9:56 AM  Result Value Ref Range   Glucose-Capillary 169 (H) 70 - 99 mg/dL    Comment: Glucose reference range applies only to samples taken after fasting for at least 8 hours.  MRSA Next Gen by PCR, Nasal     Status: None   Collection Time: 04/12/23 12:16 PM   Specimen: Nasal Mucosa; Nasal  Swab  Result Value Ref Range   MRSA by PCR Next Gen NOT DETECTED NOT DETECTED    Comment: (NOTE) The GeneXpert MRSA Assay (FDA approved for NASAL specimens only), is one component of a comprehensive MRSA colonization surveillance program. It is not intended to diagnose MRSA infection nor to guide or monitor treatment for MRSA infections. Test performance is not FDA approved in patients less than 22 years old. Performed at [facility], [address] [place], [place] [address]   Glucose, capillary     Status: Abnormal   Collection Time: 04/12/23 12:17 PM  Result Value Ref Range   Glucose-Capillary 143 (H) 70 - 99 mg/dL    Comment: Glucose reference range applies only to samples taken after fasting for at least 8 hours.  Urinalysis, w/ Reflex to Culture (Infection Suspected) -Urine, Clean Catch     Status: Abnormal   Collection Time: 04/12/23  1:25 PM  Result Value Ref Range   Specimen Source URINE, CLEAN CATCH    Color, Urine YELLOW YELLOW   APPearance HAZY (A) CLEAR   Specific Gravity, Urine 1.030 1.005 - 1.030   pH 5.0 5.0 - 8.0   Glucose, UA NEGATIVE NEGATIVE mg/dL   Hgb urine dipstick MODERATE (A) NEGATIVE   Bilirubin Urine NEGATIVE NEGATIVE   Ketones, ur NEGATIVE NEGATIVE mg/dL   Protein, ur 914 (A) NEGATIVE mg/dL   Nitrite NEGATIVE NEGATIVE   Leukocytes,Ua SMALL (A) NEGATIVE   RBC / HPF 0-5 0 - 5 RBC/hpf   WBC, UA 21-50 0 - 5 WBC/hpf    Comment:        Reflex urine culture not performed if WBC <=10, OR if Squamous epithelial cells >5. If Squamous epithelial cells >5 suggest recollection.    Bacteria, UA NONE SEEN NONE SEEN   Squamous Epithelial / HPF 0-5 0 - 5 /HPF    Comment: Performed at [facility], [address], [place], [place] [address]  Legionella Pneumophila Serogp 1 Ur Ag     Status: None   Collection Time: 04/12/23  1:25 PM  Result Value Ref Range   L. pneumophila Serogp 1 Ur Ag Negative Negative  Comment: (NOTE) Presumptive negative for L.  pneumophila serogroup 1 antigen in urine, suggesting no recent or current infection. Legionnaires' disease cannot be ruled out since other serogroups and species may also cause disease. Performed At: [facility] [address] [place], [place] [id number] Jolene Schimke MD WU:[id number]    Source of Sample URINE, CLEAN CATCH     Comment: Performed at [facility], [address] [place], [place] [address]  Strep pneumoniae urinary antigen     Status: None   Collection Time: 04/12/23  1:25 PM  Result Value Ref Range   Strep Pneumo Urinary Antigen NEGATIVE NEGATIVE    Comment:        Infection due to S. pneumoniae cannot be absolutely ruled out since the antigen present may be below the detection limit of the test. Performed at [facility], [address], [place], [place] [address]   Urine Culture     Status: None   Collection Time: 04/12/23  1:25 PM   Specimen: Urine, Clean Catch  Result Value Ref Range   Specimen Description      URINE, CLEAN CATCH Performed at [facility], [address], [place], [place] [address]    Special Requests      NONE Reflexed from [id number] Performed at [facility], [address] [place], [place] [address]    Culture      NO GROWTH Performed at [facility], [address] [place] [place], [place] [address]    Report Status 04/13/2023 FINAL   Glucose, capillary     Status: Abnormal   Collection Time: 04/12/23  4:34 PM  Result Value Ref Range   Glucose-Capillary 107 (H) 70 - 99 mg/dL    Comment: Glucose reference range applies only to samples taken after fasting for at least 8 hours.  Glucose, capillary     Status: Abnormal   Collection Time: 04/12/23  8:52 PM  Result Value Ref Range   Glucose-Capillary 142 (H) 70 - 99 mg/dL    Comment: Glucose reference range applies only to samples taken after fasting for at least 8 hours.   Comment 1 Notify RN    Comment 2 Document in Chart   Comprehensive metabolic panel      Status: Abnormal   Collection Time: 04/13/23  5:02 AM  Result Value Ref Range   Sodium 136 135 - 145 mmol/L   Potassium 3.8 3.5 - 5.1 mmol/L   Chloride 104 98 - 111 mmol/L   CO2 23 22 - 32 mmol/L   Glucose, Bld 134 (H) 70 - 99 mg/dL    Comment: Glucose reference range applies only to samples taken after fasting for at least 8 hours.   BUN 16 6 - 20 mg/dL   Creatinine, Ser 7.25 (H) 0.61 - 1.24 mg/dL   Calcium 8.3 (L) 8.9 - 10.3 mg/dL   Total Protein 6.8 6.5 - 8.1 g/dL   Albumin 2.9 (L) 3.5 - 5.0 g/dL   AST 15 15 - 41 U/L   ALT 33 0 - 44 U/L   Alkaline Phosphatase 90 38 - 126 U/L   Total Bilirubin 0.9 0.3 - 1.2 mg/dL   GFR, Estimated 55 (L) >60 mL/min    Comment: (NOTE) Calculated using the CKD-EPI Creatinine Equation (2021)    Anion gap 9 5 - 15    Comment: Performed at [facility], [address], [place], [place] [address]  CBC     Status: Abnormal   Collection Time: 04/13/23  5:02 AM  Result Value Ref Range   WBC 15.8 (H) 4.0 - 10.5 K/uL   RBC 5.41 4.22 - 5.81 MIL/uL   Hemoglobin 13.4 13.0 - 17.0 g/dL   HCT 16.1 09.6 - 04.5 %   MCV 79.1 (L) 80.0 - 100.0 fL   MCH 24.8 (L) 26.0 - 34.0 pg   MCHC 31.3 30.0 - 36.0 g/dL   RDW 40.9 (H) 81.1 - 91.4 %   Platelets 187 150 - 400 K/uL   nRBC 0.0 0.0 - 0.2 %    Comment: Performed at [facility], [address], [place], [place] [address]  Glucose, capillary     Status: Abnormal   Collection Time: 04/13/23  8:48 AM  Result Value Ref Range   Glucose-Capillary 145 (H) 70 - 99 mg/dL    Comment: Glucose reference range applies only to samples taken after fasting for at least 8 hours.  Glucose, capillary     Status: Abnormal   Collection Time: 04/13/23  1:22 PM  Result Value Ref Range   Glucose-Capillary 137 (H) 70 - 99 mg/dL    Comment: Glucose reference range applies only to samples taken after fasting for at least 8 hours.  Glucose, capillary     Status: None   Collection Time: 04/13/23  4:20 PM  Result Value Ref Range    Glucose-Capillary 93 70 - 99 mg/dL    Comment: Glucose reference range applies only to samples taken after fasting for at least 8 hours.  Glucose, capillary     Status: Abnormal   Collection Time: 04/13/23  9:12 PM  Result Value Ref Range   Glucose-Capillary 105 (H) 70 - 99 mg/dL    Comment: Glucose reference range applies only to samples taken after fasting for at least 8 hours.   Comment 1 Notify RN    Comment 2 Document in Chart   Troponin I (High Sensitivity)     Status: Abnormal   Collection Time: 04/13/23 11:25 PM  Result Value Ref Range   Troponin I (High Sensitivity) 28 (H) <18 ng/L    Comment: (NOTE) Elevated high sensitivity troponin I (hsTnI) values and significant  changes across serial measurements may suggest ACS but many other  chronic and acute conditions are known to elevate hsTnI results.  Refer to the "Links" section for chest pain algorithms and additional  guidance. Performed at [facility], [address], [place], [place] [address]   Basic metabolic panel     Status: Abnormal   Collection Time: 04/13/23 11:25 PM  Result Value Ref Range   Sodium 136 135 - 145 mmol/L   Potassium 3.7 3.5 - 5.1 mmol/L   Chloride 103 98 - 111 mmol/L   CO2 23 22 - 32 mmol/L   Glucose, Bld 130 (H) 70 - 99 mg/dL    Comment: Glucose reference range applies only to samples taken after fasting for at least 8 hours.   BUN 15 6 - 20 mg/dL   Creatinine, Ser 8.65 (H) 0.61 - 1.24 mg/dL   Calcium 8.7 (L) 8.9 - 10.3 mg/dL   GFR, Estimated 55 (L) >60 mL/min    Comment: (NOTE) Calculated using the CKD-EPI Creatinine Equation (2021)    Anion gap 10 5 - 15    Comment: Performed at [facility], [address] [place], [place] [address]  Magnesium     Status: None   Collection Time: 04/13/23 11:25 PM  Result Value Ref Range   Magnesium 2.1 1.7 - 2.4 mg/dL  Comment: Performed at [facility], [address], [place], [place] [address]  Troponin I (High Sensitivity)     Status: Abnormal    Collection Time: 04/14/23  1:22 AM  Result Value Ref Range   Troponin I (High Sensitivity) 25 (H) <18 ng/L    Comment: (NOTE) Elevated high sensitivity troponin I (hsTnI) values and significant  changes across serial measurements may suggest ACS but many other  chronic and acute conditions are known to elevate hsTnI results.  Refer to the "Links" section for chest pain algorithms and additional  guidance. Performed at [facility], [address], [place], [place] [address]   CBC     Status: Abnormal   Collection Time: 04/14/23  5:22 AM  Result Value Ref Range   WBC 13.7 (H) 4.0 - 10.5 K/uL   RBC 5.62 4.22 - 5.81 MIL/uL   Hemoglobin 13.8 13.0 - 17.0 g/dL   HCT 13.0 86.5 - 78.4 %   MCV 77.8 (L) 80.0 - 100.0 fL   MCH 24.6 (L) 26.0 - 34.0 pg   MCHC 31.6 30.0 - 36.0 g/dL   RDW 69.6 (H) 29.5 - 28.4 %   Platelets 202 150 - 400 K/uL   nRBC 0.0 0.0 - 0.2 %    Comment: Performed at [facility], [address], [place], [place] [address]  Basic metabolic panel     Status: Abnormal   Collection Time: 04/14/23  5:22 AM  Result Value Ref Range   Sodium 136 135 - 145 mmol/L   Potassium 3.8 3.5 - 5.1 mmol/L   Chloride 106 98 - 111 mmol/L   CO2 21 (L) 22 - 32 mmol/L   Glucose, Bld 143 (H) 70 - 99 mg/dL    Comment: Glucose reference range applies only to samples taken after fasting for at least 8 hours.   BUN 15 6 - 20 mg/dL   Creatinine, Ser 0.10 (H) 0.61 - 1.24 mg/dL   Calcium 8.6 (L) 8.9 - 10.3 mg/dL   GFR, Estimated >27 >25 mL/min    Comment: (NOTE) Calculated using the CKD-EPI Creatinine Equation (2021)    Anion gap 9 5 - 15    Comment: Performed at [facility], [address], [place], [place] [address]  Magnesium     Status: None   Collection Time: 04/14/23  5:22 AM  Result Value Ref Range   Magnesium 2.1 1.7 - 2.4 mg/dL    Comment: Performed at [facility], [address], [place], [place] [address]  Glucose, capillary     Status: Abnormal   Collection Time: 04/14/23  7:32 AM   Result Value Ref Range   Glucose-Capillary 124 (H) 70 - 99 mg/dL    Comment: Glucose reference range applies only to samples taken after fasting for at least 8 hours.   Comment 1 Notify RN    Comment 2 Document in Chart   Glucose, capillary     Status: Abnormal   Collection Time: 04/14/23 11:21 AM  Result Value Ref Range   Glucose-Capillary 113 (H) 70 - 99 mg/dL    Comment: Glucose reference range applies only to samples taken after fasting for at least 8 hours.   Comment 1 Notify RN    Comment 2 Document in Chart   Glucose, capillary     Status: Abnormal   Collection Time: 04/14/23  4:44 PM  Result Value Ref Range   Glucose-Capillary 122 (H) 70 - 99 mg/dL    Comment: Glucose reference range applies only to samples  taken after fasting for at least 8 hours.   Comment 1 Notify RN    Comment 2 Document in Chart   Glucose, capillary     Status: Abnormal   Collection Time: 04/14/23  9:38 PM  Result Value Ref Range   Glucose-Capillary 110 (H) 70 - 99 mg/dL    Comment: Glucose reference range applies only to samples taken after fasting for at least 8 hours.  CBC     Status: Abnormal   Collection Time: 04/15/23  5:17 AM  Result Value Ref Range   WBC 11.8 (H) 4.0 - 10.5 K/uL   RBC 5.51 4.22 - 5.81 MIL/uL   Hemoglobin 13.6 13.0 - 17.0 g/dL   HCT 16.1 09.6 - 04.5 %   MCV 77.5 (L) 80.0 - 100.0 fL   MCH 24.7 (L) 26.0 - 34.0 pg   MCHC 31.9 30.0 - 36.0 g/dL   RDW 40.9 (H) 81.1 - 91.4 %   Platelets 236 150 - 400 K/uL   nRBC 0.0 0.0 - 0.2 %    Comment: Performed at [facility], [address] [place], [place] [address]  Basic metabolic panel     Status: Abnormal   Collection Time: 04/15/23  5:17 AM  Result Value Ref Range   Sodium 136 135 - 145 mmol/L   Potassium 4.0 3.5 - 5.1 mmol/L   Chloride 105 98 - 111 mmol/L   CO2 23 22 - 32 mmol/L   Glucose, Bld 127 (H) 70 - 99 mg/dL    Comment: Glucose reference range applies only to samples taken after fasting for at least 8 hours.   BUN 13  6 - 20 mg/dL   Creatinine, Ser 6.21 0.61 - 1.24 mg/dL   Calcium 8.7 (L) 8.9 - 10.3 mg/dL   GFR, Estimated >30 >86 mL/min    Comment: (NOTE) Calculated using the CKD-EPI Creatinine Equation (2021)    Anion gap 8 5 - 15    Comment: Performed at [facility], [address], [place], [place] [address]  Glucose, capillary     Status: Abnormal   Collection Time: 04/15/23  7:38 AM  Result Value Ref Range   Glucose-Capillary 120 (H) 70 - 99 mg/dL    Comment: Glucose reference range applies only to samples taken after fasting for at least 8 hours.  Glucose, capillary     Status: Abnormal   Collection Time: 04/15/23 11:22 AM  Result Value Ref Range   Glucose-Capillary 136 (H) 70 - 99 mg/dL    Comment: Glucose reference range applies only to samples taken after fasting for at least 8 hours.  Glucose, capillary     Status: None   Collection Time: 04/15/23  4:23 PM  Result Value Ref Range   Glucose-Capillary 97 70 - 99 mg/dL    Comment: Glucose reference range applies only to samples taken after fasting for at least 8 hours.  Glucose, capillary     Status: Abnormal   Collection Time: 04/15/23 10:15 PM  Result Value Ref Range   Glucose-Capillary 167 (H) 70 - 99 mg/dL    Comment: Glucose reference range applies only to samples taken after fasting for at least 8 hours.  Glucose, capillary     Status: Abnormal   Collection Time: 04/16/23  8:21 AM  Result Value Ref Range   Glucose-Capillary 180 (H) 70 - 99 mg/dL    Comment: Glucose reference range applies only to samples taken after fasting for at least 8 hours.  Glucose, capillary  Status: Abnormal   Collection Time: 04/16/23 11:18 AM  Result Value Ref Range   Glucose-Capillary 201 (H) 70 - 99 mg/dL    Comment: Glucose reference range applies only to samples taken after fasting for at least 8 hours.  Glucose, capillary     Status: Abnormal   Collection Time: 04/16/23  5:52 PM  Result Value Ref Range   Glucose-Capillary 173 (H) 70 - 99  mg/dL    Comment: Glucose reference range applies only to samples taken after fasting for at least 8 hours.   Comment 1 Notify RN    Comment 2 Document in Chart   Glucose, capillary     Status: Abnormal   Collection Time: 04/16/23  9:03 PM  Result Value Ref Range   Glucose-Capillary 248 (H) 70 - 99 mg/dL    Comment: Glucose reference range applies only to samples taken after fasting for at least 8 hours.   Comment 1 Notify RN    Comment 2 Document in Chart   Glucose, capillary     Status: Abnormal   Collection Time: 04/17/23  7:28 AM  Result Value Ref Range   Glucose-Capillary 180 (H) 70 - 99 mg/dL    Comment: Glucose reference range applies only to samples taken after fasting for at least 8 hours.  Glucose, capillary     Status: Abnormal   Collection Time: 04/17/23 11:06 AM  Result Value Ref Range   Glucose-Capillary 214 (H) 70 - 99 mg/dL    Comment: Glucose reference range applies only to samples taken after fasting for at least 8 hours.    Recent Results (from the past 720 hour(s))  Blood Culture (routine x 2)     Status: None   Collection Time: 04/12/23 12:34 AM   Specimen: BLOOD RIGHT FOREARM  Result Value Ref Range Status   Specimen Description BLOOD RIGHT FOREARM  Final   Special Requests   Final    BOTTLES DRAWN AEROBIC AND ANAEROBIC Blood Culture adequate volume   Culture   Final    NO GROWTH 5 DAYS Performed at [facility], [address] [place], [place] [address]    Report Status 04/17/2023 FINAL  Final  Resp panel by RT-PCR (RSV, Flu A&B, Covid) Anterior Nasal Swab     Status: None   Collection Time: 04/12/23 12:36 AM   Specimen: Anterior Nasal Swab  Result Value Ref Range Status   SARS Coronavirus 2 by RT PCR NEGATIVE NEGATIVE Final    Comment: (NOTE) SARS-CoV-2 target nucleic acids are NOT DETECTED.  The SARS-CoV-2 RNA is generally detectable in upper respiratory specimens during the acute phase of infection. The lowest concentration of SARS-CoV-2 viral  copies this assay can detect is 138 copies/mL. A negative result does not preclude SARS-Cov-2 infection and should not be used as the sole basis for treatment or other patient management decisions. A negative result may occur with  improper specimen collection/handling, submission of specimen other than nasopharyngeal swab, presence of viral mutation(s) within the areas targeted by this assay, and inadequate number of viral copies(<138 copies/mL). A negative result must be combined with clinical observations, patient history, and epidemiological information. The expected result is Negative.  Fact Sheet for Patients:  BloggerCourse.com  Fact Sheet for Healthcare Providers:  SeriousBroker.it  This test is no t yet approved or cleared by the [place] FDA and  has been authorized for detection and/or diagnosis of SARS-CoV-2 by FDA under an Emergency Use Authorization (EUA). This EUA will remain  in effect (  meaning this test can be used) for the duration of the COVID-19 declaration under Section 564(b)(1) of the Act, 21 U.S.C.section 360bbb-3(b)(1), unless the authorization is terminated  or revoked sooner.       Influenza A by PCR NEGATIVE NEGATIVE Final   Influenza B by PCR NEGATIVE NEGATIVE Final    Comment: (NOTE) The Xpert Xpress SARS-CoV-2/FLU/RSV plus assay is intended as an aid in the diagnosis of influenza from Nasopharyngeal swab specimens and should not be used as a sole basis for treatment. Nasal washings and aspirates are unacceptable for Xpert Xpress SARS-CoV-2/FLU/RSV testing.  Fact Sheet for Patients: BloggerCourse.com  Fact Sheet for Healthcare Providers: SeriousBroker.it  This test is not yet approved or cleared by the [place] FDA and has been authorized for detection and/or diagnosis of SARS-CoV-2 by FDA under an Emergency Use Authorization (EUA). This  EUA will remain in effect (meaning this test can be used) for the duration of the COVID-19 declaration under Section 564(b)(1) of the Act, 21 U.S.C. section 360bbb-3(b)(1), unless the authorization is terminated or revoked.     Resp Syncytial Virus by PCR NEGATIVE NEGATIVE Final    Comment: (NOTE) Fact Sheet for Patients: BloggerCourse.com  Fact Sheet for Healthcare Providers: SeriousBroker.it  This test is not yet approved or cleared by the [place] FDA and has been authorized for detection and/or diagnosis of SARS-CoV-2 by FDA under an Emergency Use Authorization (EUA). This EUA will remain in effect (meaning this test can be used) for the duration of the COVID-19 declaration under Section 564(b)(1) of the Act, 21 U.S.C. section 360bbb-3(b)(1), unless the authorization is terminated or revoked.  Performed at [facility], [address], [place], [place] [address]   Blood Culture (routine x 2)     Status: None   Collection Time: 04/12/23 12:50 AM   Specimen: BLOOD LEFT ARM  Result Value Ref Range Status   Specimen Description BLOOD LEFT ARM  Final   Special Requests   Final    BOTTLES DRAWN AEROBIC AND ANAEROBIC Blood Culture adequate volume   Culture   Final    NO GROWTH 5 DAYS Performed at [facility], [address] [place], [place] [address]    Report Status 04/17/2023 FINAL  Final  MRSA Next Gen by PCR, Nasal     Status: None   Collection Time: 04/12/23 12:16 PM   Specimen: Nasal Mucosa; Nasal Swab  Result Value Ref Range Status   MRSA by PCR Next Gen NOT DETECTED NOT DETECTED Final    Comment: (NOTE) The GeneXpert MRSA Assay (FDA approved for NASAL specimens only), is one component of a comprehensive MRSA colonization surveillance program. It is not intended to diagnose MRSA infection nor to guide or monitor treatment for MRSA infections. Test performance is not FDA approved in patients less than 2  years old. Performed at [facility], [address] [place], [place] [address]   Urine Culture     Status: None   Collection Time: 04/12/23  1:25 PM   Specimen: Urine, Clean Catch  Result Value Ref Range Status   Specimen Description   Final    URINE, CLEAN CATCH Performed at [facility], [address] [place], [place] [address]    Special Requests   Final    NONE Reflexed from [id number] Performed at [facility], [address], [place], [place] [address]    Culture   Final    NO GROWTH Performed at [facility], [address]  [place], [place] [address]    Report Status 04/13/2023 FINAL  Final     CT Angio Chest PE W and/or Wo Contrast  Result Date: 04/12/2023 CLINICAL DATA:  Cough fever EXAM: CT ANGIOGRAPHY CHEST WITH CONTRAST TECHNIQUE: Multidetector CT imaging of the chest was performed using the standard protocol during bolus administration of intravenous contrast. Multiplanar CT image reconstructions and MIPs were obtained to evaluate the vascular anatomy. RADIATION DOSE REDUCTION: This exam was performed according to the departmental dose-optimization program which includes automated exposure control, adjustment of the mA and/or kV according to patient size and/or use of iterative reconstruction technique. CONTRAST:  OMNIPAQUE IOHEXOL 350 MG/ML SOLN COMPARISON:  Chest xray 04/12/2023, CT chest 07/30/2022 FINDINGS: Cardiovascular: Satisfactory opacification of the pulmonary arteries to the segmental level. No evidence of pulmonary embolism. Normal heart size. No pericardial effusion.Nonaneurysmal aorta. Mediastinum/Nodes: Stable to slight increased mediastinal adenopathy for example subcarinal node measures 2.2 cm compared with 1.8 cm previously. Thyroid gland, trachea, and esophagus demonstrate no significant findings. Lungs/Pleura: Heterogenous consolidations in the left greater than right lower lobes. No pleural effusion. No pneumothorax Upper Abdomen: No acute  abnormality. Musculoskeletal: No chest wall abnormality. No acute or significant osseous findings. Review of the MIP images confirms the above findings. IMPRESSION: 1. Negative for acute pulmonary embolus. 2. Heterogenous consolidations in the left greater than right lower lobes consistent with pneumonia. 3. Stable to slight increased mediastinal adenopathy, likely reactive. Electronically Signed   By: Jasmine Pang M.D.   On: 04/12/2023 02:54   DG Chest Port 1 View  Result Date: 04/12/2023 CLINICAL DATA:  Cough, chills, fever, congestion. EXAM: PORTABLE CHEST 1 VIEW COMPARISON:  Chest radiographs and CT 07/30/2022 FINDINGS: Stable cardiomediastinal silhouette. Bibasilar atelectasis or infiltrates. No pleural effusion or pneumothorax. No displaced rib fractures. IMPRESSION: Bibasilar atelectasis or infiltrates. Electronically Signed   By: Minerva Fester M.D.   On: 04/12/2023 00:51       [name], MD  04/25/2023 1:09 PM

## 2023-05-08 ENCOUNTER — Other Ambulatory Visit: Payer: Self-pay | Admitting: Internal Medicine

## 2023-05-08 ENCOUNTER — Encounter: Payer: Self-pay | Admitting: Internal Medicine

## 2023-05-08 ENCOUNTER — Ambulatory Visit (INDEPENDENT_AMBULATORY_CARE_PROVIDER_SITE_OTHER): Payer: 59 | Admitting: Internal Medicine

## 2023-05-08 VITALS — BP 122/73 | HR 67 | Temp 98.2°F | Ht 71.0 in | Wt 375.4 lb

## 2023-05-08 DIAGNOSIS — Z6841 Body Mass Index (BMI) 40.0 and over, adult: Secondary | ICD-10-CM

## 2023-05-08 DIAGNOSIS — E1169 Type 2 diabetes mellitus with other specified complication: Secondary | ICD-10-CM

## 2023-05-08 DIAGNOSIS — R972 Elevated prostate specific antigen [PSA]: Secondary | ICD-10-CM | POA: Diagnosis not present

## 2023-05-08 DIAGNOSIS — R0609 Other forms of dyspnea: Secondary | ICD-10-CM

## 2023-05-08 DIAGNOSIS — I2584 Coronary atherosclerosis due to calcified coronary lesion: Secondary | ICD-10-CM

## 2023-05-08 DIAGNOSIS — I10 Essential (primary) hypertension: Secondary | ICD-10-CM

## 2023-05-08 DIAGNOSIS — I2510 Atherosclerotic heart disease of native coronary artery without angina pectoris: Secondary | ICD-10-CM

## 2023-05-08 DIAGNOSIS — E782 Mixed hyperlipidemia: Secondary | ICD-10-CM

## 2023-05-08 DIAGNOSIS — Z7985 Long-term (current) use of injectable non-insulin antidiabetic drugs: Secondary | ICD-10-CM

## 2023-05-08 DIAGNOSIS — Z79899 Other long term (current) drug therapy: Secondary | ICD-10-CM

## 2023-05-08 DIAGNOSIS — J189 Pneumonia, unspecified organism: Secondary | ICD-10-CM

## 2023-05-08 DIAGNOSIS — M170 Bilateral primary osteoarthritis of knee: Secondary | ICD-10-CM

## 2023-05-08 DIAGNOSIS — R718 Other abnormality of red blood cells: Secondary | ICD-10-CM

## 2023-05-08 DIAGNOSIS — I5032 Chronic diastolic (congestive) heart failure: Secondary | ICD-10-CM

## 2023-05-08 DIAGNOSIS — I878 Other specified disorders of veins: Secondary | ICD-10-CM

## 2023-05-08 DIAGNOSIS — I152 Hypertension secondary to endocrine disorders: Secondary | ICD-10-CM

## 2023-05-08 DIAGNOSIS — I872 Venous insufficiency (chronic) (peripheral): Secondary | ICD-10-CM

## 2023-05-08 LAB — CBC WITH DIFFERENTIAL/PLATELET
Basophils Absolute: 0 10*3/uL (ref 0.0–0.1)
Basophils Relative: 0.5 % (ref 0.0–3.0)
Eosinophils Absolute: 0.2 10*3/uL (ref 0.0–0.7)
Eosinophils Relative: 2 % (ref 0.0–5.0)
HCT: 45.5 % (ref 39.0–52.0)
Hemoglobin: 14.5 g/dL (ref 13.0–17.0)
Lymphocytes Relative: 18.4 % (ref 12.0–46.0)
Lymphs Abs: 1.4 10*3/uL (ref 0.7–4.0)
MCHC: 31.8 g/dL (ref 30.0–36.0)
MCV: 77.1 fl — ABNORMAL LOW (ref 78.0–100.0)
Monocytes Absolute: 0.4 10*3/uL (ref 0.1–1.0)
Monocytes Relative: 5.3 % (ref 3.0–12.0)
Neutro Abs: 5.8 10*3/uL (ref 1.4–7.7)
Neutrophils Relative %: 73.8 % (ref 43.0–77.0)
Platelets: 196 10*3/uL (ref 150.0–400.0)
RBC: 5.9 Mil/uL — ABNORMAL HIGH (ref 4.22–5.81)
RDW: 18.3 % — ABNORMAL HIGH (ref 11.5–15.5)
WBC: 7.9 10*3/uL (ref 4.0–10.5)

## 2023-05-08 LAB — PSA: PSA: 11.53 ng/mL — ABNORMAL HIGH (ref 0.10–4.00)

## 2023-05-08 LAB — BASIC METABOLIC PANEL
BUN: 13 mg/dL (ref 6–23)
CO2: 24 mEq/L (ref 19–32)
Calcium: 9 mg/dL (ref 8.4–10.5)
Chloride: 105 mEq/L (ref 96–112)
Creatinine, Ser: 1.12 mg/dL (ref 0.40–1.50)
GFR: 71.43 mL/min (ref >60.00)
Glucose, Bld: 140 mg/dL — ABNORMAL HIGH (ref 70–99)
Potassium: 3.9 mEq/L (ref 3.5–5.1)
Sodium: 139 mEq/L (ref 135–145)

## 2023-05-08 LAB — FERRITIN: Ferritin: 38.7 ng/mL (ref 22.0–322.0)

## 2023-05-08 MED ORDER — RSVPREF3 VAC RECOMB ADJUVANTED 120 MCG/0.5ML IM SUSR
0.5000 mL | Freq: Once | INTRAMUSCULAR | 0 refills | Status: AC
Start: 2023-05-08 — End: 2023-05-08

## 2023-05-08 MED ORDER — REPATHA SURECLICK 140 MG/ML ~~LOC~~ SOAJ
140.0000 mg | SUBCUTANEOUS | 2 refills | Status: DC
Start: 2023-05-08 — End: 2023-11-14

## 2023-05-08 MED ORDER — ASPIRIN 81 MG PO TBEC
81.0000 mg | DELAYED_RELEASE_TABLET | Freq: Every day | ORAL | 5 refills | Status: DC
Start: 2023-05-08 — End: 2023-10-05

## 2023-05-08 NOTE — Assessment & Plan Note (Signed)
Noted on old records, reports he never saw urology. Will recheck and probably refer. Advised patient he needs cardiac rehab to help get in shape for possible biopsy, and follow up with cardiologist.

## 2023-05-08 NOTE — Assessment & Plan Note (Signed)
Encouraged continuing with Mounjaro/metformin Encouraged patient to exercise with cardiac rehab support.

## 2023-05-08 NOTE — Assessment & Plan Note (Signed)
I refuse to accept high cardiovascular disease risk of unmet goals for cholesterol - will resubmit for Repatha prior authorization again.

## 2023-05-08 NOTE — Assessment & Plan Note (Signed)
Controlled Continue metformin Mounjaro Need eye exam / foot exam repeat.

## 2023-05-08 NOTE — Assessment & Plan Note (Signed)
Seems good for now- need to continue monitoring due to history lability

## 2023-05-08 NOTE — Assessment & Plan Note (Addendum)
We discussed fluids, advised patient limit salts  Confirmed he is taking  I wanted him on praluent but insurance won't cover, but at least we have Mounjaro and he is using CPAP and I explained most likely this is reason for the fluid overload  Discussed cardiac rehab but he lives far away- found a [facility] in [place]- asked him to check if they offer cardiac rehab there is also physical therapy that might be able to be a safer way to exercise. Offered assistance with disability to claim if he wants.  This is very disabling and associated with recurrent hospitalization.  I support total permanent disability claim if he wishes to seek.

## 2023-05-08 NOTE — Assessment & Plan Note (Signed)
Encouraged patient to schedule with his cardiologist to discuss noted in hospital but patient reports it was definitively malfunctioning because he has no symptom(s) at that time.

## 2023-05-08 NOTE — Progress Notes (Signed)
[facility]: [phone]   Routine Medical Office Visit  Patient:  Jason Meyer      Age: 62 y.o.       Sex:  male  Date:   05/08/2023 PCP:    [name], MD   Today's Healthcare Provider: [name], MD   Assessment and Plan:   Today was to go over all his chronic disease monitoring where we just did a hospital follow up.   Type 2 diabetes mellitus with other specified complication, without long-term current use of insulin (HCC) Assessment & Plan: Controlled Continue metformin Mounjaro Need eye exam / foot exam repeat.  Orders: -     Basic metabolic panel -     CBC with Differential/Platelet -     Repatha SureClick; Inject 140 mg into the skin every 14 (fourteen) days.  Dispense: 2 mL; Refill: 2 -     Aspirin; Take 1 tablet (81 mg total) by mouth daily. Swallow whole.  Dispense: 100 tablet; Refill: 5 -     RSV,Recombinant PF (Arexvy)  Essential hypertension Assessment & Plan: Seems good for now- need to continue monitoring due to history lability   Orders: -     Repatha SureClick; Inject 140 mg into the skin every 14 (fourteen) days.  Dispense: 2 mL; Refill: 2  Morbid obesity with BMI of 50.0-59.9, adult [facility]) Assessment & Plan: Encouraged continuing with Mounjaro/metformin Encouraged patient to exercise with cardiac rehab support.  Orders: -     AMB referral to cardiac rehabilitation  Mixed hyperlipidemia Assessment & Plan: I refuse to accept high cardiovascular disease risk of unmet goals for cholesterol - will resubmit for Repatha prior authorization again.  Orders: -     Repatha SureClick; Inject 140 mg into the skin every 14 (fourteen) days.  Dispense: 2 mL; Refill: 2 -     Aspirin; Take 1 tablet (81 mg total) by mouth daily. Swallow whole.  Dispense: 100 tablet; Refill: 5  Primary osteoarthritis of both knees  Venous stasis  DOE (dyspnea on exertion) Assessment & Plan: Encouraged patient to schedule with his  cardiologist to discuss noted in hospital but patient reports it was definitively malfunctioning because he has no symptom(s) at that time.  Orders: -     AMB referral to cardiac rehabilitation -     Repatha SureClick; Inject 140 mg into the skin every 14 (fourteen) days.  Dispense: 2 mL; Refill: 2  Chronic diastolic heart failure (HCC) Assessment & Plan: We discussed fluids, advised patient limit salts  Confirmed he is taking  I wanted him on praluent but insurance won't cover, but at least we have Mounjaro and he is using CPAP and I explained most likely this is reason for the fluid overload  Discussed cardiac rehab but he lives far away- found a [facility] in [place]- asked him to check if they offer cardiac rehab there is also physical therapy that might be able to be a safer way to exercise. Offered assistance with disability to claim if he wants.  This is very disabling and associated with recurrent hospitalization.  I support total permanent disability claim if he wishes to seek.   Orders: -     AMB referral to cardiac rehabilitation -     Basic metabolic panel -     CBC with Differential/Platelet -     Repatha SureClick; Inject 140 mg into the skin every 14 (fourteen) days.  Dispense: 2 mL; Refill: 2  Venous  stasis dermatitis of both lower extremities  Elevated PSA Assessment & Plan: Noted on old records, reports he never saw urology. Will recheck and probably refer. Advised patient he needs cardiac rehab to help get in shape for possible biopsy, and follow up with cardiologist.  Orders: -     PSA  Microcytosis -     Ferritin  Coronary artery calcification -     Repatha SureClick; Inject 140 mg into the skin every 14 (fourteen) days.  Dispense: 2 mL; Refill: 2 -     Aspirin; Take 1 tablet (81 mg total) by mouth daily. Swallow whole.  Dispense: 100 tablet; Refill: 5  On statin therapy -     Repatha SureClick; Inject 140 mg into the skin every 14 (fourteen)  days.  Dispense: 2 mL; Refill: 2 -     Aspirin; Take 1 tablet (81 mg total) by mouth daily. Swallow whole.  Dispense: 100 tablet; Refill: 5  Hypertension due to endocrine disorder Assessment & Plan: Seems good for now- need to continue monitoring due to history lability    Community acquired pneumonia, unspecified laterality Assessment & Plan: Encouraged patient to complete follow up XR- seems resolved  Orders: -     RSV,Recombinant PF (Arexvy)  Treatment plan discussed and reviewed in detail. Explained medication safety and potential side effects. Agreed on patient returning to office if symptoms worsen, persist, or new symptoms develop. Discussed precautions in case of needing to visit the Emergency Department. Answered all patient questions and confirmed understanding and comfort with the plan. Encouraged patient to contact our office if they have any questions or concerns.       Clinical Presentation:    62 y.o. male here today for 2 week follow-up  HPI   The following table summarizes the actions taken during the encounter to manage (by editing,updating, adding,and resolving)  Problem  On Statin Therapy   But cholesterol not at goal despite max rosuvastatin.   Cap (Community Acquired Pneumonia)  Microcytosis   Lab Results  Component Value Date/Time   MCV 77.2 (L) 12/26/2022 12:00 PM   MCV 81.3 08/01/2022 04:37 AM   MCV 81.0 07/31/2022 05:47 AM   MCV 80.6 07/30/2022 11:17 AM   MCV 77 (L) 03/29/2022 11:00 AM   MCV 77 (L) 02/22/2022 12:01 PM   MCV 78.4 (L) 02/11/2022 04:42 AM   MCV 78 (L) 12/22/2021 10:46 AM   MCV 80 10/13/2021 09:21 AM   MCV 79 05/26/2021 02:14 PM   Lab Results  Component Value Date/Time   HGB 14.3 12/26/2022 12:00 PM   HGB 13.7 08/01/2022 04:37 AM   HGB 14.0 07/31/2022 05:47 AM   HGB 14.4 07/30/2022 11:17 AM   HGB 14.7 03/29/2022 11:00 AM   HGB 16.0 02/22/2022 12:01 PM   HGB 13.9 02/11/2022 04:42 AM   HGB 14.8 12/22/2021 10:46 AM   HGB 15.9  10/13/2021 09:21 AM   HGB 15.1 05/26/2021 02:14 PM   No results found for: "FERRITIN"     Elevated Psa   Lab Results  Component Value Date   PSA1 8.9 (H) 12/22/2021   PSA 2.2 07/06/2014       Chronic Diastolic Heart Failure (Hcc)   Admitted with acute hypoxemic respiratory failure in the setting of acute diastolic CHF exacerbation Noted on CTA  which also noted Cardiac enlargement, bilateral pleural effusions and diffuse ground-glass attenuation suggestive of mild CHF. Chronic ble leg swelling on lasix.  Probably needs spironolactone  Result Date: 07/31/2022  ECHOCARDIOGRAM REPORT    IMPRESSIONS  1. Left ventricular ejection fraction, by estimation, is 60 to 65%. The left ventricle has normal function. The left ventricle has no regional wall motion abnormalities. There is moderate left ventricular hypertrophy. Left ventricular diastolic parameters were normal.  2. Right ventricular systolic function was not well visualized. The right ventricular size is not well visualized. Tricuspid regurgitation signal is inadequate for assessing PA pressure.  3. The mitral valve was not well visualized. No evidence of mitral valve regurgitation. No evidence of mitral stenosis.  4. The aortic valve was not well visualized. Aortic valve regurgitation is not visualized. No aortic stenosis is present.  5. The inferior vena cava is dilated in size with >50% respiratory variability, suggesting right atrial pressure of 8 mmHg. FINDINGS  Left Ventricle: Left ventricular ejection fraction, by estimation, is 60 to 65%. The left ventricle has normal function. The left ventricle has no regional wall motion abnormalities. Definity contrast agent was given IV to delineate the left ventricular  endocardial borders. The left ventricular internal cavity size was normal in size. There is moderate left ventricular hypertrophy. Left ventricular diastolic parameters were normal. Right Ventricle: The right ventricular size is not  well visualized. Right vetricular wall thickness was not well visualized. Right ventricular systolic function was not well visualized. Tricuspid regurgitation signal is inadequate for assessing PA pressure. Left Atrium: Left atrial size was not well visualized. Right Atrium: Right atrial size was not well visualized. Pericardium: There is no evidence of pericardial effusion. Mitral Valve: The mitral valve was not well visualized. No evidence of mitral valve regurgitation. No evidence of mitral valve stenosis. Tricuspid Valve: The tricuspid valve is not well visualized. Tricuspid valve regurgitation is not demonstrated. No evidence of tricuspid stenosis. Aortic Valve: The aortic valve was not well visualized. Aortic valve regurgitation is not visualized. No aortic stenosis is present. Aortic valve mean gradient measures 9.0 mmHg. Aortic valve peak gradient measures 16.6 mmHg. Aortic valve area, by VTI measures 3.07 cm. Pulmonic Valve: The pulmonic valve was not well visualized. Pulmonic valve regurgitation is not visualized. No evidence of pulmonic stenosis. Aorta: The aortic root is normal in size and structure. Venous: The inferior vena cava is dilated in size with greater than 50% respiratory variability, suggesting right atrial pressure of 8 mmHg. IAS/Shunts: No atrial level shunt detected by color flow Doppler.  LEFT VENTRICLE PLAX 2D LVIDd:         5.20 cm   Diastology LVIDs:         3.35 cm   LV e' medial:    10.20 cm/s LV PW:         1.35 cm   LV E/e' medial:  10.1 LV IVS:        1.40 cm   LV e' lateral:   11.90 cm/s LVOT diam:     2.30 cm   LV E/e' lateral: 8.7 LV SV:         132 LV SV Index:   47 LVOT Area:     4.15 cm  RIGHT VENTRICLE RV S prime:     11.60 cm/s TAPSE (M-mode): 3.0 cm LEFT ATRIUM             Index LA diam:        3.75 cm 1.34 cm/m LA Vol (A2C):   87.6 ml 31.26 ml/m LA Vol (A4C):   84.2 ml 30.05 ml/m LA Biplane Vol: 91.4 ml 32.62 ml/m  AORTIC VALVE AV Area (Vmax):  2.77 cm AV Area  (Vmean):   2.94 cm AV Area (VTI):     3.07 cm AV Vmax:           204.00 cm/s AV Vmean:          132.000 cm/s AV VTI:            0.431 m AV Peak Grad:      16.6 mmHg AV Mean Grad:      9.0 mmHg LVOT Vmax:         136.00 cm/s LVOT Vmean:        93.400 cm/s LVOT VTI:          0.318 m LVOT/AV VTI ratio: 0.74  AORTA Ao Root diam: 3.30 cm MITRAL VALVE MV Area (PHT): 3.08 cm     SHUNTS MV Decel Time: 246 msec     Systemic VTI:  0.32 m MV E velocity: 103.00 cm/s  Systemic Diam: 2.30 cm MV A velocity: 93.80 cm/s MV E/A ratio:  1.10 Dina Rich MD Electronically signed by Dina Rich MD Signature Date/Time: 07/31/2022/1:39:37 PM    Final      Doe (Dyspnea On Exertion)   Interim history: whole lot better than last visit. Yet to see cardiologist but was noted to have telemetric monitoring concern(s) during hospitalization for pneumonia - needs to schedule.    Onset: June 2022 Quit smoking 2009 with good residual ext tol @ wt 300 2009  Echocardiogram 09/06/2021  1. Left ventricular ejection fraction, by estimation, is 60 to 65%. The  left ventricle has normal function. Left ventricular endocardial border  not optimally defined to evaluate regional wall motion. There is moderate  concentric left ventricular hypertrophy. Left ventricular diastolic parameters were normal.   2. Right ventricular systolic function is normal. The right ventricular  size is normal. Tricuspid regurgitation signal is inadequate for assessing PA pressure.  3 The inferior vena cava is dilated in size with >50% respiratory  variability, suggesting right atrial pressure of 8 mmHg.  - CTa 08/17/21 nl  - 10/13/2021 patient walked at a slow pace on room air. Reported SOB on first lap that increased on second lap. Stopped after second lap due to SOB and knee pain lowest sat 94%  - trial off acei 10/13/2021 - trial off acei / high dose lopressor 10/13/2021 - Allergy profile 10/13/21  >  Eos 0.3 /  IgE  146    Type 2 Diabetes Mellitus With  Other Specified Complication (Hcc)   May 08, 2023 interim history: He reports that his perception is that his diabetes as been well controlled since last visit  He reports home capillary blood glucose readings since last visit are in the range of none taken but he is taking Mounjaro 7.5 now  He reports compliance/adherence with his current medication(s) which include: Diabetic Medications as of 05/08/2023           metFORMIN (GLUCOPHAGE) 1000 MG tablet (Taking) Take 1 tablet (1,000 mg total) by mouth 2 (two) times daily with a meal.   tirzepatide (MOUNJARO) 5 MG/0.5ML Pen (Taking) Inject 5 mg into the skin once a week.   tirzepatide (MOUNJARO) 7.5 MG/0.5ML Pen (Taking) Inject 7.5 mg into the skin once a week. Replaces 5 mg dose.      Labs and Risk Scores: Lab Results  Component Value Date   HGBA1C 6.0 (H) 04/12/2023   HGBA1C 5.9 (H) 07/14/2022   HGBA1C 6.3 (H) 03/29/2022   HGBA1C 6.2 (H) 02/10/2022   HGBA1C  6.5 (H) 12/22/2021   Lab Results  Component Value Date   LDLCALC 94 12/26/2022   Lab Results  Component Value Date   LABMICR See below: 06/29/2020   LABMICR See below: 10/10/2018   MICROALBUR 1.7 12/26/2022  Diabetes Composite Score: 4  Values used to calculate this score:   Points  Metrics      1        Blood Pressure: 122/73      1        Prescribed Statins: Yes      1        Hemoglobin A1c: 6.0%      1        Smokes Tobacco: No      0        Prescribed Aspirin: No      Health Maintenance: Diabetes Health Maintenance Due  Topic Date Due   FOOT EXAM  09/15/2021   OPHTHALMOLOGY EXAM  09/24/2021   HEMOGLOBIN A1C  10/13/2023  No foot exam found Diabetic Foot Exam - Simple   No data filed   Care Team Ophthalmologist:  No care team member to display    Hypertension    Interim history from May 08, 2023:    Home readings: good at home Patient reports taking current medications consistently and not experiencing any significant associated side effects or  symptoms. Current hypertension medications:       Sig   amLODipine (NORVASC) 10 MG tablet (Taking) Take 1 tablet (10 mg total) by mouth daily.   bisoprolol (ZEBETA) 10 MG tablet (Taking) Take 2 tablets (20 mg total) by mouth daily.   furosemide (LASIX) 40 MG tablet (Taking) Take 1 tablet (40 mg total) by mouth daily. Take extra 40 mg for 3 pound weight gain in 24 hours, or swelling or shortness of breath   olmesartan (BENICAR) 40 MG tablet (Taking) Take 1 tablet (40 mg total) by mouth daily.   sildenafil (REVATIO) 20 MG tablet (Taking) 2-5 pills once daily as needed for erectile dysfunction      Lab Results  Component Value Date   NA 136 04/15/2023   K 4.0 04/15/2023   CREATININE 1.19 04/15/2023   GFR 67.96 12/26/2022  D/c acei and lopressor 10/13/2021 due to concerns adverse drug effects   Morbid Obesity With Bmi of 50.0-59.9, Adult (Hcc)   Body mass index is 53.89 kg/m.  Has tried a salad based diet in the past but no formal diet like weight watchers Never been on any medication for weight loss as of 09/14/2022 but will order to start trying His skin he would be scared to have weight loss surgery    Mixed Hyperlipidemia   Insurance won't cover praluent/repatha due to, so he is not able to reach cholesterol goal: [email or web address] He continues to take rosuvastatin 40  Statin myopathy history: no Current Regimen: rosuvastatin 40 Diet and exercise   Lab Results  Component Value Date   CHOL 139 12/26/2022   CHOL 137 03/29/2022   CHOL 148 12/22/2021   Lab Results  Component Value Date   HDL 30.50 (L) 12/26/2022   HDL 32 (L) 03/29/2022   HDL 33 (L) 12/22/2021   Lab Results  Component Value Date   LDLCALC 94 12/26/2022   LDLCALC 86 03/29/2022   LDLCALC 93 12/22/2021   Lab Results  Component Value Date   TRIG 74.0 12/26/2022   TRIG 99 03/29/2022   TRIG 118 12/22/2021   Lab Results  Component Value Date  CHOLHDL 5 12/26/2022   CHOLHDL 4.3 03/29/2022   CHOLHDL 4.5 12/22/2021  No results found for: "LDLDIRECT"  The 10-year ASCVD risk score (Arnett DK, et al., 2019) is: 23%   Values used to calculate the score:     Age: 66 years     Sex: Male     Is Non-Hispanic African American: Yes     Diabetic: Yes     Tobacco smoker: No     Systolic Blood Pressure: 122 mmHg     Is BP treated: Yes     HDL Cholesterol: 30.5 mg/dL     Total Cholesterol: 139 mg/dL    Sepsis (Hcc) (Resolved)     Reviewed chart data: Active Ambulatory Problems    Diagnosis Date Noted   Type 2 diabetes mellitus with other specified complication (HCC) 04/23/2013   Hypertension 04/23/2013   Morbid obesity with BMI of 50.0-59.9, adult (HCC) 04/23/2013   Mixed hyperlipidemia 04/23/2013   Venous stasis 09/20/2015   OA (osteoarthritis) 11/23/2015   DOE (dyspnea on exertion) 06/22/2021   Chronic diastolic heart failure (HCC) 08/11/2022   Venous stasis dermatitis of both lower extremities 08/11/2022   OSA (obstructive sleep apnea) 10/19/2022   Erectile dysfunction 10/19/2022   Low HDL (under 40) 10/19/2022   Elevated PSA 10/19/2022   Coronary artery calcification 10/19/2022   Coronary artery disease 12/26/2022   Microcytosis 12/27/2022   CAP (community acquired pneumonia) 04/12/2023   Low mean corpuscular volume (MCV) 04/12/2023   On statin therapy 05/08/2023   Resolved Ambulatory Problems    Diagnosis Date Noted   Hyperkalemia 09/20/2015   Urinary frequency 05/03/2021   Acute respiratory failure with hypoxia (HCC) 02/09/2022   UTI (urinary tract infection) 02/10/2022   CAP (community acquired pneumonia) 02/10/2022   Acute respiratory failure with hypoxia (HCC) 07/30/2022   Acute congestive heart failure (HCC) 07/30/2022   Colon cancer screening 12/26/2022   Sepsis (HCC) 04/12/2023   Past Medical History:  Diagnosis Date   Allergy    Arthritis    Hyperlipidemia    Sleep apnea    Type 2 diabetes mellitus  (HCC)     Outpatient Medications Prior to Visit  Medication Sig   acetaminophen (TYLENOL) 325 MG tablet Take 2 tablets (650 mg total) by mouth every 6 (six) hours as needed for mild pain (or Fever >/= 101).   albuterol (PROVENTIL) (2.5 MG/3ML) 0.083% nebulizer solution Take 3 mLs (2.5 mg total) by nebulization every 6 (six) hours as needed for wheezing or shortness of breath.   albuterol (VENTOLIN HFA) 108 (90 Base) MCG/ACT inhaler Inhale 2 puffs into the lungs every 6 (six) hours as needed for wheezing or shortness of breath.   amLODipine (NORVASC) 10 MG tablet Take 1 tablet (10 mg total) by mouth daily.   bisoprolol (ZEBETA) 10 MG tablet Take 2 tablets (20 mg total) by mouth daily.   buPROPion (WELLBUTRIN XL) 150 MG 24 hr tablet Take 150 mg by mouth every morning.   celecoxib (CELEBREX) 200 MG capsule Take 1 capsule (200 mg total) by mouth daily. Replaces meloxicam   fluticasone (FLONASE) 50 MCG/ACT nasal spray Place 2 sprays into both nostrils daily.   fluticasone furoate-vilanterol (BREO ELLIPTA) 100-25 MCG/ACT AEPB Inhale 1 puff into the lungs daily.   furosemide (LASIX) 40 MG tablet Take 1 tablet (40 mg total) by mouth daily. Take extra 40 mg for 3 pound weight gain in 24 hours, or swelling or shortness of breath   metFORMIN (GLUCOPHAGE) 1000 MG tablet Take 1  tablet (1,000 mg total) by mouth 2 (two) times daily with a meal.   olmesartan (BENICAR) 40 MG tablet Take 1 tablet (40 mg total) by mouth daily.   ondansetron (ZOFRAN-ODT) 4 MG disintegrating tablet Take 1 tablet (4 mg total) by mouth every 8 (eight) hours as needed for nausea or vomiting.   pantoprazole (PROTONIX) 40 MG tablet Take 1 tablet (40 mg total) by mouth daily.   potassium chloride (MICRO-K) 10 MEQ CR capsule TAKE 1 CAPSULE BY MOUTH EVERY DAY   rosuvastatin (CRESTOR) 40 MG tablet Take 1 tablet (40 mg total) by mouth daily. Replaces atorvastatin which failed to reach Low Density Lipoprotein (LDL cholesterol) goal of 55, take  only if repatha not approved/available.   sildenafil (REVATIO) 20 MG tablet 2-5 pills once daily as needed for erectile dysfunction   tirzepatide (MOUNJARO) 5 MG/0.5ML Pen Inject 5 mg into the skin once a week.   tirzepatide (MOUNJARO) 7.5 MG/0.5ML Pen Inject 7.5 mg into the skin once a week. Replaces 5 mg dose.   [DISCONTINUED] Alirocumab (PRALUENT) 150 MG/ML SOAJ Inject 150 mg into the skin every 14 (fourteen) days.   No facility-administered medications prior to visit.         Clinical Data Analysis:   Physical Exam  BP 122/73 (BP Location: Right Arm, Patient Position: Sitting)   Pulse 67   Temp 98.2 F (36.8 C) (Temporal)   Ht 5\' 11"  (1.803 m)   Wt (!) 375 lb 6.4 oz (170.3 kg)   SpO2 94%   BMI 52.36 kg/m  Wt Readings from Last 10 Encounters:  05/08/23 (!) 375 lb 6.4 oz (170.3 kg)  04/25/23 (!) 364 lb (165.1 kg)  04/13/23 (!) 379 lb 3.1 oz (172 kg)  02/06/23 (!) 380 lb 9.6 oz (172.6 kg)  12/26/22 (!) 376 lb 3.2 oz (170.6 kg)  10/19/22 (!) 390 lb (176.9 kg)  09/14/22 (!) 389 lb 6.4 oz (176.6 kg)  08/23/22 (!) 388 lb (176 kg)  08/11/22 (!) 386 lb 6.4 oz (175.3 kg)  08/01/22 (!) 384 lb 6.4 oz (174.4 kg)   Vital signs reviewed.  Nursing notes reviewed. Weight trend reviewed. Abnormalities and Problem-Specific physical exam findings:  marked truncal adiposity General Appearance:  No acute distress appreciable.   Well-groomed, healthy-appearing male.  Well proportioned with no abnormal fat distribution.  Good muscle tone. Skin: Clear and well-hydrated. Pulmonary:  Normal work of breathing at rest, no respiratory distress apparent. SpO2: 94 %  Musculoskeletal: All extremities are intact.  Neurological:  Awake, alert, oriented, and engaged.  No obvious focal neurological deficits or cognitive impairments.  Sensorium seems unclouded.   Speech is clear and coherent with logical content. Psychiatric:  Appropriate mood, pleasant and cooperative demeanor, thoughtful and engaged during  the exam  Results Reviewed:    Results for orders placed or performed in visit on 05/08/23  PSA  Result Value Ref Range   PSA 11.53 (H) 0.10 - 4.00 ng/mL  Basic Metabolic Panel (BMET)  Result Value Ref Range   Sodium 139 135 - 145 mEq/L   Potassium 3.9 3.5 - 5.1 mEq/L   Chloride 105 96 - 112 mEq/L   CO2 24 19 - 32 mEq/L   Glucose, Bld 140 (H) 70 - 99 mg/dL   BUN 13 6 - 23 mg/dL   Creatinine, Ser 6.04 0.40 - 1.50 mg/dL   GFR 54.09 >81.19 mL/min   Calcium 9.0 8.4 - 10.5 mg/dL  CBC with Differential/Platelet  Result Value Ref Range   WBC  7.9 4.0 - 10.5 K/uL   RBC 5.90 (H) 4.22 - 5.81 Mil/uL   Hemoglobin 14.5 13.0 - 17.0 g/dL   HCT 16.1 09.6 - 04.5 %   MCV 77.1 (L) 78.0 - 100.0 fl   MCHC 31.8 30.0 - 36.0 g/dL   RDW 40.9 (H) 81.1 - 91.4 %   Platelets 196.0 150.0 - 400.0 K/uL   Neutrophils Relative % 73.8 43.0 - 77.0 %   Lymphocytes Relative 18.4 12.0 - 46.0 %   Monocytes Relative 5.3 3.0 - 12.0 %   Eosinophils Relative 2.0 0.0 - 5.0 %   Basophils Relative 0.5 0.0 - 3.0 %   Neutro Abs 5.8 1.4 - 7.7 K/uL   Lymphs Abs 1.4 0.7 - 4.0 K/uL   Monocytes Absolute 0.4 0.1 - 1.0 K/uL   Eosinophils Absolute 0.2 0.0 - 0.7 K/uL   Basophils Absolute 0.0 0.0 - 0.1 K/uL  Ferritin  Result Value Ref Range   Ferritin 38.7 22.0 - 322.0 ng/mL    Recent Results (from the past 2160 hour(s))  Lactic acid, plasma     Status: None   Collection Time: 04/12/23 12:34 AM  Result Value Ref Range   Lactic Acid, Venous 1.7 0.5 - 1.9 mmol/L    Comment: Performed at [facility], [address], [place], [place] [address]  Comprehensive metabolic panel     Status: Abnormal   Collection Time: 04/12/23 12:34 AM  Result Value Ref Range   Sodium 135 135 - 145 mmol/L   Potassium 4.0 3.5 - 5.1 mmol/L   Chloride 102 98 - 111 mmol/L   CO2 23 22 - 32 mmol/L   Glucose, Bld 187 (H) 70 - 99 mg/dL    Comment: Glucose reference range applies only to samples taken after fasting for at least 8 hours.   BUN 15 6 -  20 mg/dL   Creatinine, Ser 6.21 (H) 0.61 - 1.24 mg/dL   Calcium 8.9 8.9 - 30.8 mg/dL   Total Protein 8.0 6.5 - 8.1 g/dL   Albumin 3.6 3.5 - 5.0 g/dL   AST 20 15 - 41 U/L   ALT 59 (H) 0 - 44 U/L   Alkaline Phosphatase 120 38 - 126 U/L   Total Bilirubin 1.1 0.3 - 1.2 mg/dL   GFR, Estimated 48 (L) >60 mL/min    Comment: (NOTE) Calculated using the CKD-EPI Creatinine Equation (2021)    Anion gap 10 5 - 15    Comment: Performed at [facility], [address] [place], [place] [address]  CBC with Differential     Status: Abnormal   Collection Time: 04/12/23 12:34 AM  Result Value Ref Range   WBC 20.4 (H) 4.0 - 10.5 K/uL   RBC 6.21 (H) 4.22 - 5.81 MIL/uL   Hemoglobin 15.2 13.0 - 17.0 g/dL   HCT 69.6 29.5 - 28.4 %   MCV 77.5 (L) 80.0 - 100.0 fL   MCH 24.5 (L) 26.0 - 34.0 pg   MCHC 31.6 30.0 - 36.0 g/dL   RDW 13.2 (H) 44.0 - 10.2 %   Platelets 216 150 - 400 K/uL   nRBC 0.0 0.0 - 0.2 %   Neutrophils Relative % 90 %   Neutro Abs 18.4 (H) 1.7 - 7.7 K/uL   Lymphocytes Relative 4 %   Lymphs Abs 0.8 0.7 - 4.0 K/uL   Monocytes Relative 5 %   Monocytes Absolute 0.9 0.1 - 1.0 K/uL   Eosinophils Relative 0 %   Eosinophils Absolute 0.1 0.0 -  0.5 K/uL   Basophils Relative 0 %   Basophils Absolute 0.1 0.0 - 0.1 K/uL   Immature Granulocytes 1 %   Abs Immature Granulocytes 0.14 (H) 0.00 - 0.07 K/uL    Comment: Performed at [facility], [address], [place], [place] [address]  Protime-INR     Status: None   Collection Time: 04/12/23 12:34 AM  Result Value Ref Range   Prothrombin Time 15.2 11.4 - 15.2 seconds   INR 1.2 0.8 - 1.2    Comment: (NOTE) INR goal varies based on device and disease states. Performed at [facility], [address] [place], [place] [address]   APTT     Status: None   Collection Time: 04/12/23 12:34 AM  Result Value Ref Range   aPTT 33 24 - 36 seconds    Comment: Performed at [facility], [address], [place], [place] [address]  Blood Culture (routine x 2)      Status: None   Collection Time: 04/12/23 12:34 AM   Specimen: BLOOD RIGHT FOREARM  Result Value Ref Range   Specimen Description BLOOD RIGHT FOREARM    Special Requests      BOTTLES DRAWN AEROBIC AND ANAEROBIC Blood Culture adequate volume   Culture      NO GROWTH 5 DAYS Performed at [facility], [address], [place], [place] [address]    Report Status 04/17/2023 FINAL   Resp panel by RT-PCR (RSV, Flu A&B, Covid) Anterior Nasal Swab     Status: None   Collection Time: 04/12/23 12:36 AM   Specimen: Anterior Nasal Swab  Result Value Ref Range   SARS Coronavirus 2 by RT PCR NEGATIVE NEGATIVE    Comment: (NOTE) SARS-CoV-2 target nucleic acids are NOT DETECTED.  The SARS-CoV-2 RNA is generally detectable in upper respiratory specimens during the acute phase of infection. The lowest concentration of SARS-CoV-2 viral copies this assay can detect is 138 copies/mL. A negative result does not preclude SARS-Cov-2 infection and should not be used as the sole basis for treatment or other patient management decisions. A negative result may occur with  improper specimen collection/handling, submission of specimen other than nasopharyngeal swab, presence of viral mutation(s) within the areas targeted by this assay, and inadequate number of viral copies(<138 copies/mL). A negative result must be combined with clinical observations, patient history, and epidemiological information. The expected result is Negative.  Fact Sheet for Patients:  BloggerCourse.com  Fact Sheet for Healthcare Providers:  SeriousBroker.it  This test is no t yet approved or cleared by the [place] FDA and  has been authorized for detection and/or diagnosis of SARS-CoV-2 by FDA under an Emergency Use Authorization (EUA). This EUA will remain  in effect (meaning this test can be used) for the duration of the COVID-19 declaration under Section 564(b)(1) of the  Act, 21 U.S.C.section 360bbb-3(b)(1), unless the authorization is terminated  or revoked sooner.       Influenza A by PCR NEGATIVE NEGATIVE   Influenza B by PCR NEGATIVE NEGATIVE    Comment: (NOTE) The Xpert Xpress SARS-CoV-2/FLU/RSV plus assay is intended as an aid in the diagnosis of influenza from Nasopharyngeal swab specimens and should not be used as a sole basis for treatment. Nasal washings and aspirates are unacceptable for Xpert Xpress SARS-CoV-2/FLU/RSV testing.  Fact Sheet for Patients: BloggerCourse.com  Fact Sheet for Healthcare Providers: SeriousBroker.it  This test is not yet approved or cleared by the [place] FDA and has been authorized for detection and/or diagnosis of  SARS-CoV-2 by FDA under an Emergency Use Authorization (EUA). This EUA will remain in effect (meaning this test can be used) for the duration of the COVID-19 declaration under Section 564(b)(1) of the Act, 21 U.S.C. section 360bbb-3(b)(1), unless the authorization is terminated or revoked.     Resp Syncytial Virus by PCR NEGATIVE NEGATIVE    Comment: (NOTE) Fact Sheet for Patients: BloggerCourse.com  Fact Sheet for Healthcare Providers: SeriousBroker.it  This test is not yet approved or cleared by the [place] FDA and has been authorized for detection and/or diagnosis of SARS-CoV-2 by FDA under an Emergency Use Authorization (EUA). This EUA will remain in effect (meaning this test can be used) for the duration of the COVID-19 declaration under Section 564(b)(1) of the Act, 21 U.S.C. section 360bbb-3(b)(1), unless the authorization is terminated or revoked.  Performed at [facility], [address] [place], [place] [address]   Blood Culture (routine x 2)     Status: None   Collection Time: 04/12/23 12:50 AM   Specimen: BLOOD LEFT ARM  Result Value Ref Range   Specimen  Description BLOOD LEFT ARM    Special Requests      BOTTLES DRAWN AEROBIC AND ANAEROBIC Blood Culture adequate volume   Culture      NO GROWTH 5 DAYS Performed at [facility], [address], [place], [place] [address]    Report Status 04/17/2023 FINAL   Lactic acid, plasma     Status: None   Collection Time: 04/12/23  2:19 AM  Result Value Ref Range   Lactic Acid, Venous 1.3 0.5 - 1.9 mmol/L    Comment: Performed at [facility], [address] [place], [place] [address]  HIV Antibody (routine testing w rflx)     Status: None   Collection Time: 04/12/23  2:19 AM  Result Value Ref Range   HIV Screen 4th Generation wRfx Non Reactive Non Reactive    Comment: Performed at [facility], [address], [place], [place] [address]  Magnesium     Status: None   Collection Time: 04/12/23  2:19 AM  Result Value Ref Range   Magnesium 1.7 1.7 - 2.4 mg/dL    Comment: Performed at [facility], [address] [place], [place] [address]  Phosphorus     Status: Abnormal   Collection Time: 04/12/23  2:19 AM  Result Value Ref Range   Phosphorus 1.8 (L) 2.5 - 4.6 mg/dL    Comment: Performed at [facility], [address] [place], [place] [address]  Procalcitonin     Status: None   Collection Time: 04/12/23  2:19 AM  Result Value Ref Range   Procalcitonin 5.67 ng/mL    Comment:        Interpretation: PCT > 2 ng/mL: Systemic infection (sepsis) is likely, unless other causes are known. (NOTE)       Sepsis PCT Algorithm           Lower Respiratory Tract                                      Infection PCT Algorithm    ----------------------------     ----------------------------         PCT < 0.25 ng/mL                PCT < 0.10 ng/mL          Strongly encourage  Strongly discourage   discontinuation of antibiotics    initiation of antibiotics    ----------------------------     -----------------------------       PCT 0.25 - 0.50 ng/mL            PCT 0.10 - 0.25 ng/mL                OR       >80% decrease in PCT            Discourage initiation of                                            antibiotics      Encourage discontinuation           of antibiotics    ----------------------------     -----------------------------         PCT >= 0.50 ng/mL              PCT 0.26 - 0.50 ng/mL               AND       <80% decrease in PCT              Encourage initiation of                                             antibiotics       Encourage continuation           of antibiotics    ----------------------------     -----------------------------        PCT >= 0.50 ng/mL                  PCT > 0.50 ng/mL               AND         increase in PCT                  Strongly encourage                                      initiation of antibiotics    Strongly encourage escalation           of antibiotics                                     -----------------------------                                           PCT <= 0.25 ng/mL                                                 OR                                        >  80% decrease in PCT                                      Discontinue / Do not initiate                                             antibiotics  Performed at [facility], [address], [place], [place] [address]   Ferritin     Status: None   Collection Time: 04/12/23  2:19 AM  Result Value Ref Range   Ferritin 90 24 - 336 ng/mL    Comment: Performed at [facility], [address], [place], [place] [address]  Iron and TIBC     Status: Abnormal   Collection Time: 04/12/23  2:19 AM  Result Value Ref Range   Iron 8 (L) 45 - 182 ug/dL   TIBC 098 119 - 147 ug/dL   Saturation Ratios 3 (L) 17.9 - 39.5 %   UIBC 279 ug/dL    Comment: Performed at [facility], [address], [place], [place] [address]  Hemoglobin A1c     Status: Abnormal   Collection Time: 04/12/23  2:19 AM  Result Value Ref Range   Hgb A1c MFr Bld 6.0 (H) 4.8 - 5.6 %    Comment:  (NOTE) Pre diabetes:          5.7%-6.4%  Diabetes:              >6.4%  Glycemic control for   <7.0% adults with diabetes    Mean Plasma Glucose 125.5 mg/dL    Comment: Performed at [facility], [address], [place], [place] [address]  Cortisol-am, blood     Status: Abnormal   Collection Time: 04/12/23  7:26 AM  Result Value Ref Range   Cortisol - AM 25.4 (H) 6.7 - 22.6 ug/dL    Comment: Performed at [facility], [address] [place], [place] [address]  CBG monitoring, ED     Status: Abnormal   Collection Time: 04/12/23  8:30 AM  Result Value Ref Range   Glucose-Capillary 169 (H) 70 - 99 mg/dL    Comment: Glucose reference range applies only to samples taken after fasting for at least 8 hours.  CBG monitoring, ED     Status: Abnormal   Collection Time: 04/12/23  9:56 AM  Result Value Ref Range   Glucose-Capillary 169 (H) 70 - 99 mg/dL    Comment: Glucose reference range applies only to samples taken after fasting for at least 8 hours.  MRSA Next Gen by PCR, Nasal     Status: None   Collection Time: 04/12/23 12:16 PM   Specimen: Nasal Mucosa; Nasal Swab  Result Value Ref Range   MRSA by PCR Next Gen NOT DETECTED NOT DETECTED    Comment: (NOTE) The GeneXpert MRSA Assay (FDA approved for NASAL specimens only), is one component of a comprehensive MRSA colonization surveillance program. It is not intended to diagnose MRSA infection nor to guide or monitor treatment for MRSA infections. Test performance is not FDA approved in patients less than 28 years old. Performed at [facility], [address], [place], [place] [address]   Glucose, capillary     Status: Abnormal   Collection Time: 04/12/23 12:17 PM  Result Value Ref Range   Glucose-Capillary 143 (H) 70 - 99 mg/dL    Comment: Glucose reference range applies only to samples taken after fasting for at least 8 hours.  Urinalysis, w/ Reflex to Culture (Infection Suspected) -Urine, Clean Catch     Status:  Abnormal   Collection Time: 04/12/23  1:25 PM  Result Value Ref Range   Specimen Source URINE, CLEAN CATCH    Color, Urine YELLOW YELLOW   APPearance HAZY (A) CLEAR   Specific Gravity, Urine 1.030 1.005 - 1.030   pH 5.0 5.0 - 8.0   Glucose, UA NEGATIVE NEGATIVE mg/dL   Hgb urine dipstick MODERATE (A) NEGATIVE   Bilirubin Urine NEGATIVE NEGATIVE   Ketones, ur NEGATIVE NEGATIVE mg/dL   Protein, ur 119 (A) NEGATIVE mg/dL   Nitrite NEGATIVE NEGATIVE   Leukocytes,Ua SMALL (A) NEGATIVE   RBC / HPF 0-5 0 - 5 RBC/hpf   WBC, UA 21-50 0 - 5 WBC/hpf    Comment:        Reflex urine culture not performed if WBC <=10, OR if Squamous epithelial cells >5. If Squamous epithelial cells >5 suggest recollection.    Bacteria, UA NONE SEEN NONE SEEN   Squamous Epithelial / HPF 0-5 0 - 5 /HPF    Comment: Performed at [facility], [address] [place], [place] [address]  Legionella Pneumophila Serogp 1 Ur Ag     Status: None   Collection Time: 04/12/23  1:25 PM  Result Value Ref Range   L. pneumophila Serogp 1 Ur Ag Negative Negative    Comment: (NOTE) Presumptive negative for L. pneumophila serogroup 1 antigen in urine, suggesting no recent or current infection. Legionnaires' disease cannot be ruled out since other serogroups and species may also cause disease. Performed At: [facility] [address] [place], [place] [id number] Jolene Schimke MD VH:[id number]    Source of Sample URINE, CLEAN CATCH     Comment: Performed at [facility] [address], [place], [place] [address]  Strep pneumoniae urinary antigen     Status: None   Collection Time: 04/12/23  1:25 PM  Result Value Ref Range   Strep Pneumo Urinary Antigen NEGATIVE NEGATIVE    Comment:        Infection due to S. pneumoniae cannot be absolutely ruled out since the antigen present may be below the detection limit of the test. Performed at [facility], [address] [place], [place] [address]   Urine  Culture     Status: None   Collection Time: 04/12/23  1:25 PM   Specimen: Urine, Clean Catch  Result Value Ref Range   Specimen Description      URINE, CLEAN CATCH Performed at [facility], [address] [place], [place] [address]    Special Requests      NONE Reflexed from [id number] Performed at [facility], [address], [place], [place] [address]    Culture      NO GROWTH Performed at [facility], [address] [place] [address] [place], [place] [address]    Report Status 04/13/2023 FINAL   Glucose, capillary     Status: Abnormal   Collection Time: 04/12/23  4:34 PM  Result Value Ref Range   Glucose-Capillary 107 (H) 70 - 99 mg/dL    Comment: Glucose reference range applies only to samples taken after fasting for at least 8 hours.  Glucose, capillary     Status: Abnormal   Collection Time: 04/12/23  8:52 PM  Result Value Ref Range   Glucose-Capillary 142 (H) 70 - 99 mg/dL    Comment: Glucose reference range applies only to samples taken after fasting for at least 8 hours.   Comment 1 Notify RN    Comment 2 Document in Chart   Comprehensive metabolic panel     Status: Abnormal   Collection Time: 04/13/23  5:02 AM  Result Value Ref Range   Sodium 136 135 - 145 mmol/L   Potassium 3.8 3.5 - 5.1 mmol/L   Chloride 104 98 - 111 mmol/L   CO2 23 22 - 32 mmol/L   Glucose, Bld 134 (H) 70 - 99 mg/dL    Comment: Glucose reference range applies only to samples taken after fasting for at least 8 hours.   BUN 16 6 - 20 mg/dL   Creatinine, Ser 1.61 (H) 0.61 - 1.24 mg/dL   Calcium 8.3 (L) 8.9 - 10.3 mg/dL   Total Protein 6.8 6.5 - 8.1 g/dL   Albumin 2.9 (L) 3.5 - 5.0 g/dL   AST 15 15 - 41 U/L   ALT 33 0 - 44 U/L   Alkaline Phosphatase 90 38 - 126 U/L   Total Bilirubin 0.9 0.3 - 1.2 mg/dL   GFR, Estimated 55 (L) >60 mL/min    Comment: (NOTE) Calculated using the CKD-EPI Creatinine Equation (2021)    Anion gap 9 5 - 15    Comment: Performed at [facility], [address],  [place], [place] [address]  CBC     Status: Abnormal   Collection Time: 04/13/23  5:02 AM  Result Value Ref Range   WBC 15.8 (H) 4.0 - 10.5 K/uL   RBC 5.41 4.22 - 5.81 MIL/uL   Hemoglobin 13.4 13.0 - 17.0 g/dL   HCT 54.0 98.1 - 19.1 %   MCV 79.1 (L) 80.0 - 100.0 fL   MCH 24.8 (L) 26.0 - 34.0 pg   MCHC 31.3 30.0 - 36.0 g/dL   RDW 47.8 (H) 29.5 - 62.1 %   Platelets 187 150 - 400 K/uL   nRBC 0.0 0.0 - 0.2 %    Comment: Performed at [facility], [address], [place], [place] [address]  Glucose, capillary     Status: Abnormal   Collection Time: 04/13/23  8:48 AM  Result Value Ref Range   Glucose-Capillary 145 (H) 70 - 99 mg/dL    Comment: Glucose reference range applies only to samples taken after fasting for at least 8 hours.  Glucose, capillary     Status: Abnormal   Collection Time: 04/13/23  1:22 PM  Result Value Ref Range   Glucose-Capillary 137 (H) 70 - 99 mg/dL    Comment: Glucose reference range applies only to samples taken after fasting for at least 8 hours.  Glucose, capillary     Status: None   Collection Time: 04/13/23  4:20 PM  Result Value Ref Range   Glucose-Capillary 93 70 - 99 mg/dL    Comment: Glucose reference range applies only to samples taken after fasting for at least 8 hours.  Glucose, capillary     Status: Abnormal   Collection Time: 04/13/23  9:12 PM  Result Value Ref Range   Glucose-Capillary 105 (H) 70 - 99 mg/dL    Comment: Glucose reference range applies only to samples taken after fasting for at least 8 hours.   Comment 1 Notify RN    Comment 2 Document in Chart   Troponin I (High Sensitivity)  Status: Abnormal   Collection Time: 04/13/23 11:25 PM  Result Value Ref Range   Troponin I (High Sensitivity) 28 (H) <18 ng/L    Comment: (NOTE) Elevated high sensitivity troponin I (hsTnI) values and significant  changes across serial measurements may suggest ACS but many other  chronic and acute conditions are known to elevate hsTnI results.  Refer to  the "Links" section for chest pain algorithms and additional  guidance. Performed at [facility], [address] [place], [place] [address]   Basic metabolic panel     Status: Abnormal   Collection Time: 04/13/23 11:25 PM  Result Value Ref Range   Sodium 136 135 - 145 mmol/L   Potassium 3.7 3.5 - 5.1 mmol/L   Chloride 103 98 - 111 mmol/L   CO2 23 22 - 32 mmol/L   Glucose, Bld 130 (H) 70 - 99 mg/dL    Comment: Glucose reference range applies only to samples taken after fasting for at least 8 hours.   BUN 15 6 - 20 mg/dL   Creatinine, Ser 5.95 (H) 0.61 - 1.24 mg/dL   Calcium 8.7 (L) 8.9 - 10.3 mg/dL   GFR, Estimated 55 (L) >60 mL/min    Comment: (NOTE) Calculated using the CKD-EPI Creatinine Equation (2021)    Anion gap 10 5 - 15    Comment: Performed at [facility], [address], [place], [place] [address]  Magnesium     Status: None   Collection Time: 04/13/23 11:25 PM  Result Value Ref Range   Magnesium 2.1 1.7 - 2.4 mg/dL    Comment: Performed at [facility], [address], [place], [place] [address]  Troponin I (High Sensitivity)     Status: Abnormal   Collection Time: 04/14/23  1:22 AM  Result Value Ref Range   Troponin I (High Sensitivity) 25 (H) <18 ng/L    Comment: (NOTE) Elevated high sensitivity troponin I (hsTnI) values and significant  changes across serial measurements may suggest ACS but many other  chronic and acute conditions are known to elevate hsTnI results.  Refer to the "Links" section for chest pain algorithms and additional  guidance. Performed at [facility], [address], [place], [place] [address]   CBC     Status: Abnormal   Collection Time: 04/14/23  5:22 AM  Result Value Ref Range   WBC 13.7 (H) 4.0 - 10.5 K/uL   RBC 5.62 4.22 - 5.81 MIL/uL   Hemoglobin 13.8 13.0 - 17.0 g/dL   HCT 41.6 60.6 - 30.1 %   MCV 77.8 (L) 80.0 - 100.0 fL   MCH 24.6 (L) 26.0 - 34.0 pg   MCHC 31.6 30.0 - 36.0 g/dL   RDW 60.1 (H) 09.3 - 23.5 %   Platelets 202  150 - 400 K/uL   nRBC 0.0 0.0 - 0.2 %    Comment: Performed at [facility], [address], [place], [place] [address]  Basic metabolic panel     Status: Abnormal   Collection Time: 04/14/23  5:22 AM  Result Value Ref Range   Sodium 136 135 - 145 mmol/L   Potassium 3.8 3.5 - 5.1 mmol/L   Chloride 106 98 - 111 mmol/L   CO2 21 (L) 22 - 32 mmol/L   Glucose, Bld 143 (H) 70 - 99 mg/dL    Comment: Glucose reference range applies only to samples taken after fasting for at least 8 hours.   BUN 15 6 - 20 mg/dL   Creatinine, Ser 0.25 (H)  0.61 - 1.24 mg/dL   Calcium 8.6 (L) 8.9 - 10.3 mg/dL   GFR, Estimated >42 >70 mL/min    Comment: (NOTE) Calculated using the CKD-EPI Creatinine Equation (2021)    Anion gap 9 5 - 15    Comment: Performed at [facility], [address], [place], [place] [address]  Magnesium     Status: None   Collection Time: 04/14/23  5:22 AM  Result Value Ref Range   Magnesium 2.1 1.7 - 2.4 mg/dL    Comment: Performed at [facility], [address], [place], [place] [address]  Glucose, capillary     Status: Abnormal   Collection Time: 04/14/23  7:32 AM  Result Value Ref Range   Glucose-Capillary 124 (H) 70 - 99 mg/dL    Comment: Glucose reference range applies only to samples taken after fasting for at least 8 hours.   Comment 1 Notify RN    Comment 2 Document in Chart   Glucose, capillary     Status: Abnormal   Collection Time: 04/14/23 11:21 AM  Result Value Ref Range   Glucose-Capillary 113 (H) 70 - 99 mg/dL    Comment: Glucose reference range applies only to samples taken after fasting for at least 8 hours.   Comment 1 Notify RN    Comment 2 Document in Chart   Glucose, capillary     Status: Abnormal   Collection Time: 04/14/23  4:44 PM  Result Value Ref Range   Glucose-Capillary 122 (H) 70 - 99 mg/dL    Comment: Glucose reference range applies only to samples taken after fasting for at least 8 hours.   Comment 1 Notify RN    Comment 2 Document in Chart    Glucose, capillary     Status: Abnormal   Collection Time: 04/14/23  9:38 PM  Result Value Ref Range   Glucose-Capillary 110 (H) 70 - 99 mg/dL    Comment: Glucose reference range applies only to samples taken after fasting for at least 8 hours.  CBC     Status: Abnormal   Collection Time: 04/15/23  5:17 AM  Result Value Ref Range   WBC 11.8 (H) 4.0 - 10.5 K/uL   RBC 5.51 4.22 - 5.81 MIL/uL   Hemoglobin 13.6 13.0 - 17.0 g/dL   HCT 17.6 16.0 - 73.7 %   MCV 77.5 (L) 80.0 - 100.0 fL   MCH 24.7 (L) 26.0 - 34.0 pg   MCHC 31.9 30.0 - 36.0 g/dL   RDW 10.6 (H) 26.9 - 48.5 %   Platelets 236 150 - 400 K/uL   nRBC 0.0 0.0 - 0.2 %    Comment: Performed at [facility], [address] [place], [place] [address]  Basic metabolic panel     Status: Abnormal   Collection Time: 04/15/23  5:17 AM  Result Value Ref Range   Sodium 136 135 - 145 mmol/L   Potassium 4.0 3.5 - 5.1 mmol/L   Chloride 105 98 - 111 mmol/L   CO2 23 22 - 32 mmol/L   Glucose, Bld 127 (H) 70 - 99 mg/dL    Comment: Glucose reference range applies only to samples taken after fasting for at least 8 hours.   BUN 13 6 - 20 mg/dL   Creatinine, Ser 3.50 0.61 - 1.24 mg/dL   Calcium 8.7 (L) 8.9 - 10.3 mg/dL   GFR, Estimated >09 >38 mL/min    Comment: (NOTE) Calculated using the CKD-EPI Creatinine Equation (2021)    Anion gap 8  5 - 15    Comment: Performed at [facility], [address], [place], [place] [address]  Glucose, capillary     Status: Abnormal   Collection Time: 04/15/23  7:38 AM  Result Value Ref Range   Glucose-Capillary 120 (H) 70 - 99 mg/dL    Comment: Glucose reference range applies only to samples taken after fasting for at least 8 hours.  Glucose, capillary     Status: Abnormal   Collection Time: 04/15/23 11:22 AM  Result Value Ref Range   Glucose-Capillary 136 (H) 70 - 99 mg/dL    Comment: Glucose reference range applies only to samples taken after fasting for at least 8 hours.  Glucose, capillary     Status:  None   Collection Time: 04/15/23  4:23 PM  Result Value Ref Range   Glucose-Capillary 97 70 - 99 mg/dL    Comment: Glucose reference range applies only to samples taken after fasting for at least 8 hours.  Glucose, capillary     Status: Abnormal   Collection Time: 04/15/23 10:15 PM  Result Value Ref Range   Glucose-Capillary 167 (H) 70 - 99 mg/dL    Comment: Glucose reference range applies only to samples taken after fasting for at least 8 hours.  Glucose, capillary     Status: Abnormal   Collection Time: 04/16/23  8:21 AM  Result Value Ref Range   Glucose-Capillary 180 (H) 70 - 99 mg/dL    Comment: Glucose reference range applies only to samples taken after fasting for at least 8 hours.  Glucose, capillary     Status: Abnormal   Collection Time: 04/16/23 11:18 AM  Result Value Ref Range   Glucose-Capillary 201 (H) 70 - 99 mg/dL    Comment: Glucose reference range applies only to samples taken after fasting for at least 8 hours.  Glucose, capillary     Status: Abnormal   Collection Time: 04/16/23  5:52 PM  Result Value Ref Range   Glucose-Capillary 173 (H) 70 - 99 mg/dL    Comment: Glucose reference range applies only to samples taken after fasting for at least 8 hours.   Comment 1 Notify RN    Comment 2 Document in Chart   Glucose, capillary     Status: Abnormal   Collection Time: 04/16/23  9:03 PM  Result Value Ref Range   Glucose-Capillary 248 (H) 70 - 99 mg/dL    Comment: Glucose reference range applies only to samples taken after fasting for at least 8 hours.   Comment 1 Notify RN    Comment 2 Document in Chart   Glucose, capillary     Status: Abnormal   Collection Time: 04/17/23  7:28 AM  Result Value Ref Range   Glucose-Capillary 180 (H) 70 - 99 mg/dL    Comment: Glucose reference range applies only to samples taken after fasting for at least 8 hours.  Glucose, capillary     Status: Abnormal   Collection Time: 04/17/23 11:06 AM  Result Value Ref Range    Glucose-Capillary 214 (H) 70 - 99 mg/dL    Comment: Glucose reference range applies only to samples taken after fasting for at least 8 hours.  PSA     Status: Abnormal   Collection Time: 05/08/23 11:59 AM  Result Value Ref Range   PSA 11.53 (H) 0.10 - 4.00 ng/mL    Comment: Test performed using Access Hybritech PSA Assay, a parmagnetic partical, chemiluminecent immunoassay.  Basic Metabolic Panel (BMET)     Status:  Abnormal   Collection Time: 05/08/23 11:59 AM  Result Value Ref Range   Sodium 139 135 - 145 mEq/L   Potassium 3.9 3.5 - 5.1 mEq/L   Chloride 105 96 - 112 mEq/L   CO2 24 19 - 32 mEq/L   Glucose, Bld 140 (H) 70 - 99 mg/dL   BUN 13 6 - 23 mg/dL   Creatinine, Ser 1.61 0.40 - 1.50 mg/dL   GFR 09.60 >45.40 mL/min    Comment: Calculated using the CKD-EPI Creatinine Equation (2021)   Calcium 9.0 8.4 - 10.5 mg/dL  CBC with Differential/Platelet     Status: Abnormal   Collection Time: 05/08/23 11:59 AM  Result Value Ref Range   WBC 7.9 4.0 - 10.5 K/uL   RBC 5.90 (H) 4.22 - 5.81 Mil/uL   Hemoglobin 14.5 13.0 - 17.0 g/dL   HCT 98.1 19.1 - 47.8 %   MCV 77.1 (L) 78.0 - 100.0 fl   MCHC 31.8 30.0 - 36.0 g/dL   RDW 29.5 (H) 62.1 - 30.8 %   Platelets 196.0 150.0 - 400.0 K/uL   Neutrophils Relative % 73.8 43.0 - 77.0 %   Lymphocytes Relative 18.4 12.0 - 46.0 %   Monocytes Relative 5.3 3.0 - 12.0 %   Eosinophils Relative 2.0 0.0 - 5.0 %   Basophils Relative 0.5 0.0 - 3.0 %   Neutro Abs 5.8 1.4 - 7.7 K/uL   Lymphs Abs 1.4 0.7 - 4.0 K/uL   Monocytes Absolute 0.4 0.1 - 1.0 K/uL   Eosinophils Absolute 0.2 0.0 - 0.7 K/uL   Basophils Absolute 0.0 0.0 - 0.1 K/uL  Ferritin     Status: None   Collection Time: 05/08/23 11:59 AM  Result Value Ref Range   Ferritin 38.7 22.0 - 322.0 ng/mL    No image results found.   CT Angio Chest PE W and/or Wo Contrast  Result Date: 04/12/2023 CLINICAL DATA:  Cough fever EXAM: CT ANGIOGRAPHY CHEST WITH CONTRAST TECHNIQUE: Multidetector CT imaging of  the chest was performed using the standard protocol during bolus administration of intravenous contrast. Multiplanar CT image reconstructions and MIPs were obtained to evaluate the vascular anatomy. RADIATION DOSE REDUCTION: This exam was performed according to the departmental dose-optimization program which includes automated exposure control, adjustment of the mA and/or kV according to patient size and/or use of iterative reconstruction technique. CONTRAST:  OMNIPAQUE IOHEXOL 350 MG/ML SOLN COMPARISON:  Chest xray 04/12/2023, CT chest 07/30/2022 FINDINGS: Cardiovascular: Satisfactory opacification of the pulmonary arteries to the segmental level. No evidence of pulmonary embolism. Normal heart size. No pericardial effusion.Nonaneurysmal aorta. Mediastinum/Nodes: Stable to slight increased mediastinal adenopathy for example subcarinal node measures 2.2 cm compared with 1.8 cm previously. Thyroid gland, trachea, and esophagus demonstrate no significant findings. Lungs/Pleura: Heterogenous consolidations in the left greater than right lower lobes. No pleural effusion. No pneumothorax Upper Abdomen: No acute abnormality. Musculoskeletal: No chest wall abnormality. No acute or significant osseous findings. Review of the MIP images confirms the above findings. IMPRESSION: 1. Negative for acute pulmonary embolus. 2. Heterogenous consolidations in the left greater than right lower lobes consistent with pneumonia. 3. Stable to slight increased mediastinal adenopathy, likely reactive. Electronically Signed   By: Jasmine Pang M.D.   On: 04/12/2023 02:54   DG Chest Port 1 View  Result Date: 04/12/2023 CLINICAL DATA:  Cough, chills, fever, congestion. EXAM: PORTABLE CHEST 1 VIEW COMPARISON:  Chest radiographs and CT 07/30/2022 FINDINGS: Stable cardiomediastinal silhouette. Bibasilar atelectasis or infiltrates. No pleural effusion or pneumothorax.  No displaced rib fractures. IMPRESSION: Bibasilar atelectasis or  infiltrates. Electronically Signed   By: Minerva Fester M.D.   On: 04/12/2023 00:51       Signed: [name], MD 05/08/2023 3:04 PM

## 2023-05-08 NOTE — Assessment & Plan Note (Signed)
Encouraged patient to complete follow up XR- seems resolved

## 2023-05-08 NOTE — Patient Instructions (Addendum)
It was a pleasure seeing you today! Your health and satisfaction are our top priorities.   Glenetta Hew, MD  Next Steps:  [x]  Early Intervention: Schedule sooner appointment, call our on-call services, or go to emergency room if there is Increase in pain or discomfort New or worsening symptoms Sudden or severe changes in your health [x]  Flexible Follow-Up: We recommend a Return in about 1 month (around 06/08/2023) for chronic disease monitoring and management. for optimal routine care. This allows for progress monitoring and treatment adjustments. [x]  Preventive Care: Schedule your annual preventive care visit! It's typically covered by insurance and helps identify potential health issues early. [x]  Lab & X-ray Appointments: Incomplete tests scheduled today, or call to schedule. X-rays: [facility] (M-F, 8:30am-noon or 1pm-5pm). [x]  Medical Information Release: Sign a release form at front desk to obtain relevant medical information we don't have.  Making the Most of Our Focused (20 minute) Appointments:  [x]   Clearly state your top concerns at the beginning of the visit to focus our discussion [x]   If you anticipate you will need more time, please inform the front desk during scheduling - we can book multiple appointments in the same week. [x]   If you have transportation problems- use our convenient video appointments or ask about transportation support. [x]   We can get down to business faster if you use MyChart to update information before the visit and submit non-urgent questions before your visit. Thank you for taking the time to provide details through MyChart.  Let our nurse know and she can import this information into your encounter documents.  Arrival and Wait Times: [x]   Arriving on time ensures that everyone receives prompt attention. [x]   Early morning (8a) and afternoon (1p) appointments tend to have shortest wait times. [x]   Unfortunately, we cannot delay  appointments for late arrivals or hold slots during phone calls.  Getting Answers and Following Up  [x]   Simple Questions & Concerns: For quick questions or basic follow-up after your visit, reach [place] at [phone] or MyChart messaging. [x]   Complex Concerns: If your concern is more complex, scheduling an appointment might be best. Discuss this with the staff to find the most suitable option. [x]   Lab & Imaging Results: We'll contact you directly if results are abnormal or you don't use MyChart. Most normal results will be on MyChart within 2-3 business days, with a review message from Dr. Jon Billings. Haven't heard back in 2 weeks? Need results sooner? Contact us at [phone]. [x]   Referrals: Our referral coordinator will manage specialist referrals. The specialist's office should contact you within 2 weeks to schedule an appointment. Call us if you haven't heard from them after 2 weeks.  Staying Connected  [x]   MyChart: Activate your MyChart for the fastest way to access results and message [place]. See the last page of this paperwork for instructions on how to activate.  Bring to Your Next Appointment  [x]   Medications: Please bring all your medication bottles to your next appointment to ensure we have an accurate record of your prescriptions. [x]   Health Diaries: If you're monitoring any health conditions at home, keeping a diary of your readings can be very helpful for discussions at your next appointment.  Billing  [x]   X-ray & Lab Orders: These are billed by separate companies. Contact the invoicing company directly for questions or concerns. [x]   Visit Charges: Discuss any billing inquiries with our administrative services team.  Your Satisfaction Matters  [x]   Share Your Experience: We strive for your satisfaction! If you have any complaints, or preferably compliments, please let Dr. Jon Billings know directly or contact our Practice Administrators, Edwena Felty or [employer], by  asking at the front desk.   Reviewing Your Records  [x]   Review this early draft of your clinical encounter notes below and the final encounter summary tomorrow on MyChart after its been completed.   Type 2 diabetes mellitus with other specified complication, without long-term current use of insulin (HCC) Assessment & Plan: Controlled Continue metformin Mounjaro Need eye exam / foot exam repeat.  Orders: -     Basic metabolic panel -     CBC with Differential/Platelet -     Repatha SureClick; Inject 140 mg into the skin every 14 (fourteen) days.  Dispense: 2 mL; Refill: 2 -     Aspirin; Take 1 tablet (81 mg total) by mouth daily. Swallow whole.  Dispense: 100 tablet; Refill: 5 -     RSV,Recombinant PF (Arexvy)  Essential hypertension Assessment & Plan: Seems good for now- need to continue monitoring due to history lability   Orders: -     Repatha SureClick; Inject 140 mg into the skin every 14 (fourteen) days.  Dispense: 2 mL; Refill: 2  Morbid obesity with BMI of 50.0-59.9, adult [facility]) Assessment & Plan: Encouraged continuing with Mounjaro/metformin Encouraged patient to exercise with cardiac rehab support.  Orders: -     AMB referral to cardiac rehabilitation  Mixed hyperlipidemia Assessment & Plan: I refuse to accept high cardiovascular disease risk of unmet goals for cholesterol - will resubmit for Repatha prior authorization again.  Orders: -     Repatha SureClick; Inject 140 mg into the skin every 14 (fourteen) days.  Dispense: 2 mL; Refill: 2 -     Aspirin; Take 1 tablet (81 mg total) by mouth daily. Swallow whole.  Dispense: 100 tablet; Refill: 5  Primary osteoarthritis of both knees  Venous stasis  DOE (dyspnea on exertion) Assessment & Plan: Encouraged patient to schedule with his cardiologist to discuss noted in hospital but patient reports it was definitively malfunctioning because he has no symptom(s) at that time.  Orders: -     AMB referral to cardiac  rehabilitation -     Repatha SureClick; Inject 140 mg into the skin every 14 (fourteen) days.  Dispense: 2 mL; Refill: 2  Chronic diastolic heart failure (HCC) Assessment & Plan: We discussed fluids, advised patient limit salts  Confirmed he is taking  I wanted him on praluent but insurance won't cover, but at least we have Mounjaro and he is using CPAP and I explained most likely this is reason for the fluid overload  Discussed cardiac rehab but he lives far away- found a [facility] in [place]- asked him to check if they offer cardiac rehab there is also physical therapy that might be able to be a safer way to exercise. Offered assistance with disability to claim if he wants.  This is very disabling and associated with recurrent hospitalization.  I support total permanent disability claim if he wishes to seek.   Orders: -     AMB referral to cardiac rehabilitation -     Basic metabolic panel -     CBC with Differential/Platelet -     Repatha SureClick; Inject 140 mg into the skin every 14 (fourteen) days.  Dispense: 2 mL; Refill: 2  Venous stasis dermatitis of both lower extremities  Elevated PSA Assessment & Plan:  Noted on old records, reports he never saw urology. Will recheck and probably refer. Advised patient he needs cardiac rehab to help get in shape for possible biopsy, and follow up with cardiologist.  Orders: -     PSA  Microcytosis -     Ferritin  Coronary artery calcification -     Repatha SureClick; Inject 140 mg into the skin every 14 (fourteen) days.  Dispense: 2 mL; Refill: 2 -     Aspirin; Take 1 tablet (81 mg total) by mouth daily. Swallow whole.  Dispense: 100 tablet; Refill: 5  On statin therapy -     Repatha SureClick; Inject 140 mg into the skin every 14 (fourteen) days.  Dispense: 2 mL; Refill: 2 -     Aspirin; Take 1 tablet (81 mg total) by mouth daily. Swallow whole.  Dispense: 100 tablet; Refill: 5  Hypertension due to endocrine  disorder Assessment & Plan: Seems good for now- need to continue monitoring due to history lability    Community acquired pneumonia, unspecified laterality Assessment & Plan: Encouraged patient to complete follow up XR- seems resolved  Orders: -     RSV,Recombinant PF (Arexvy)

## 2023-06-05 ENCOUNTER — Ambulatory Visit: Payer: 59 | Admitting: Internal Medicine

## 2023-06-05 ENCOUNTER — Other Ambulatory Visit: Payer: Self-pay | Admitting: Family Medicine

## 2023-06-05 DIAGNOSIS — R0602 Shortness of breath: Secondary | ICD-10-CM

## 2023-06-05 DIAGNOSIS — E662 Morbid (severe) obesity with alveolar hypoventilation: Secondary | ICD-10-CM

## 2023-07-02 ENCOUNTER — Ambulatory Visit: Payer: 59 | Admitting: Internal Medicine

## 2023-07-14 ENCOUNTER — Other Ambulatory Visit: Payer: Self-pay | Admitting: Family Medicine

## 2023-07-14 DIAGNOSIS — R0602 Shortness of breath: Secondary | ICD-10-CM

## 2023-07-14 DIAGNOSIS — E662 Morbid (severe) obesity with alveolar hypoventilation: Secondary | ICD-10-CM

## 2023-07-22 ENCOUNTER — Other Ambulatory Visit: Payer: Self-pay | Admitting: Family Medicine

## 2023-07-22 ENCOUNTER — Other Ambulatory Visit: Payer: Self-pay | Admitting: Internal Medicine

## 2023-07-22 DIAGNOSIS — I872 Venous insufficiency (chronic) (peripheral): Secondary | ICD-10-CM

## 2023-08-02 ENCOUNTER — Telehealth: Payer: Self-pay | Admitting: Internal Medicine

## 2023-08-02 NOTE — Telephone Encounter (Signed)
Prescription Request  08/02/2023  LOV: 05/08/2023  What is the name of the medication or equipment?  celecoxib (CELEBREX) 200 MG capsule   Have you contacted your pharmacy to request a refill? Yes   Which pharmacy would you like this sent to? [facility] [phone] - [place] [place] - [address] [place] [address] Phone: [phone] Fax: [phone]   Patient notified that their request is being sent to the clinical staff for review and that they should receive a response within 2 business days.   Please advise at Mobile [phone] (mobile)

## 2023-08-05 ENCOUNTER — Other Ambulatory Visit: Payer: Self-pay | Admitting: Internal Medicine

## 2023-08-05 DIAGNOSIS — M170 Bilateral primary osteoarthritis of knee: Secondary | ICD-10-CM

## 2023-08-05 DIAGNOSIS — I872 Venous insufficiency (chronic) (peripheral): Secondary | ICD-10-CM

## 2023-08-08 NOTE — Telephone Encounter (Signed)
This refill was sent in yesterday.

## 2023-08-18 ENCOUNTER — Other Ambulatory Visit: Payer: Self-pay | Admitting: Internal Medicine

## 2023-08-18 ENCOUNTER — Other Ambulatory Visit: Payer: Self-pay | Admitting: Family Medicine

## 2023-08-18 DIAGNOSIS — I872 Venous insufficiency (chronic) (peripheral): Secondary | ICD-10-CM

## 2023-08-20 ENCOUNTER — Other Ambulatory Visit: Payer: Self-pay | Admitting: Internal Medicine

## 2023-08-22 ENCOUNTER — Telehealth: Payer: Self-pay | Admitting: Internal Medicine

## 2023-08-22 NOTE — Telephone Encounter (Signed)
Patient called requesting a refill of the mounjaro. Patient has been taking the 7.5 mg dosage but wanted to confirm with PCP if he should continue taking that dosage or if PCP wanted him to go down to the 5 mg or up to a higher dosage. Patient does have an OV on 9/27 but took last injection on Monday. Please Advise.    Prescription Request  08/22/2023  LOV: 05/08/2023  What is the name of the medication or equipment? tirzepatide [facility]) 7.5 MG/0.5ML Pen   Have you contacted your pharmacy to request a refill? Yes   Which pharmacy would you like this sent to?  [facility] [phone] [facility] [place] [place] - [address] [place] [address] Phone: [phone] Fax: [phone]     Patient notified that their request is being sent to the clinical staff for review and that they should receive a response within 2 business days.   Please advise at Mobile [phone] (mobile)

## 2023-08-24 ENCOUNTER — Other Ambulatory Visit: Payer: Self-pay

## 2023-08-24 DIAGNOSIS — E1169 Type 2 diabetes mellitus with other specified complication: Secondary | ICD-10-CM

## 2023-08-24 MED ORDER — TIRZEPATIDE 10 MG/0.5ML ~~LOC~~ SOAJ
10.0000 mg | SUBCUTANEOUS | 0 refills | Status: DC
Start: 2023-08-24 — End: 2023-09-07

## 2023-08-24 NOTE — Telephone Encounter (Signed)
Sent in [place] 10 mg to requested pharmacy.

## 2023-09-07 ENCOUNTER — Other Ambulatory Visit: Payer: Self-pay | Admitting: Internal Medicine

## 2023-09-07 ENCOUNTER — Ambulatory Visit (INDEPENDENT_AMBULATORY_CARE_PROVIDER_SITE_OTHER): Payer: 59 | Admitting: Internal Medicine

## 2023-09-07 ENCOUNTER — Encounter: Payer: Self-pay | Admitting: Internal Medicine

## 2023-09-07 VITALS — BP 116/73 | HR 68 | Temp 98.0°F | Ht 71.0 in | Wt 384.2 lb

## 2023-09-07 DIAGNOSIS — E1169 Type 2 diabetes mellitus with other specified complication: Secondary | ICD-10-CM | POA: Diagnosis not present

## 2023-09-07 DIAGNOSIS — H547 Unspecified visual loss: Secondary | ICD-10-CM

## 2023-09-07 DIAGNOSIS — Z6841 Body Mass Index (BMI) 40.0 and over, adult: Secondary | ICD-10-CM

## 2023-09-07 DIAGNOSIS — Z794 Long term (current) use of insulin: Secondary | ICD-10-CM

## 2023-09-07 DIAGNOSIS — M7989 Other specified soft tissue disorders: Secondary | ICD-10-CM

## 2023-09-07 DIAGNOSIS — N1831 Chronic kidney disease, stage 3a: Secondary | ICD-10-CM

## 2023-09-07 DIAGNOSIS — M170 Bilateral primary osteoarthritis of knee: Secondary | ICD-10-CM

## 2023-09-07 DIAGNOSIS — I5032 Chronic diastolic (congestive) heart failure: Secondary | ICD-10-CM | POA: Diagnosis not present

## 2023-09-07 DIAGNOSIS — E1122 Type 2 diabetes mellitus with diabetic chronic kidney disease: Secondary | ICD-10-CM

## 2023-09-07 MED ORDER — FUROSEMIDE 40 MG PO TABS: 80.0000 mg | ORAL_TABLET | Freq: Every day | ORAL | 3 refills | Status: DC

## 2023-09-07 MED ORDER — AMLODIPINE BESYLATE 5 MG PO TABS
5.0000 mg | ORAL_TABLET | Freq: Every day | ORAL | 3 refills | Status: DC
Start: 2023-09-07 — End: 2023-11-14

## 2023-09-07 MED ORDER — TIRZEPATIDE 10 MG/0.5ML ~~LOC~~ SOAJ
10.0000 mg | SUBCUTANEOUS | 3 refills | Status: DC
Start: 2023-09-07 — End: 2023-11-14

## 2023-09-07 MED ORDER — KERENDIA 10 MG PO TABS
10.0000 mg | ORAL_TABLET | Freq: Every day | ORAL | 3 refills | Status: DC
Start: 2023-09-07 — End: 2023-09-08

## 2023-09-07 NOTE — Patient Instructions (Signed)
VISIT SUMMARY:  Dear Mr. Dobie, during our visit, we discussed your concerns about your medication regimen, weight gain, leg swelling, and vision problems. We also talked about your interest in applying for social security disability due to your health conditions.  YOUR PLAN:  -DIABETES MELLITUS: We discussed increasing your Mounjaro dosage to 10mg  to better manage your diabetes, which is a condition that affects how your body uses blood sugar.  -CHRONIC HEART FAILURE: We plan to adjust your medications to better manage your chronic heart failure, a condition where your heart doesn't pump blood as well as it should, and the associated leg swelling.  -ARTHRITIS: We will continue with the current management strategy for your arthritis, a condition that causes pain and inflammation in your joints.  -VISION IMPAIRMENT: We encouraged you to follow up with an eye doctor to address your vision problems.  INSTRUCTIONS:  Please get the RSV vaccine at the pharmacy. We have ordered lab work to be done today at [facility]. We have scheduled a follow-up appointment in 3-4 weeks to monitor your response to medication adjustments and lab results. We have also initiated the process for a disability application due to your chronic heart failure, severe arthritis, and vision impairment.

## 2023-09-07 NOTE — Assessment & Plan Note (Signed)
He has been on Mounjaro 7.5mg  for the past three months and experienced weight gain. We discussed increasing the dose to 10mg . We will send a prescription for Mounjaro 10mg  to the pharmacy. If 10mg  is unavailable, he should continue with 7.5mg  for three more months.

## 2023-09-07 NOTE — Assessment & Plan Note (Signed)
His severe arthritis is contributing to disability. We will continue with the current management strategy.

## 2023-09-07 NOTE — Assessment & Plan Note (Signed)
Increase Mounjaro

## 2023-09-07 NOTE — Assessment & Plan Note (Signed)
He presented with significant leg swelling. We discussed adjusting medications to better manage fluid accumulation. We will reduce Amlodipine from 10mg  to 5mg  due to its potential contribution to fluid accumulation and increase Lasix from 40mg  to 80mg  daily. Chauncey Mann will be added for fluid management, to be taken alongside the increased Lasix dose. Lab work will be ordered to monitor kidney function and electrolyte balance.

## 2023-09-07 NOTE — Progress Notes (Signed)
[facility]: [phone]   -- Medical Office Visit --  Patient:  Jason Meyer      Age: 62 y.o.       Sex:  male  Date:   09/07/2023 Patient Care Team: Jason Olszewski, MD as PCP - General (Internal Medicine) Jason Sidle, MD as PCP - Cardiology (Cardiology) Dettinger, Jason Radon, MD as Consulting Physician (Family Medicine) Jason Hearing, MD as Consulting Physician (Orthopedic Surgery) Today's Healthcare Provider: [name], MD      Assessment & Plan Morbid obesity with BMI of 50.0-59.9, adult (HCC) Increase Mounjaro. Type 2 diabetes mellitus with other specified complication, without long-term current use of insulin (HCC) He has been on Mounjaro 7.5mg  for the past three months and experienced weight gain. We discussed increasing the dose to 10mg . We will send a prescription for Mounjaro 10mg  to the pharmacy. If 10mg  is unavailable, he should continue with 7.5mg  for three more months. Leg swelling Will double lasix and add  kerendia to keep potassium up, avoiding spironolactone but may need to use as well, follow up soon for repeat labs Chronic diastolic heart failure (HCC) He presented with significant leg swelling. We discussed adjusting medications to better manage fluid accumulation. We will reduce Amlodipine from 10mg  to 5mg  due to its potential contribution to fluid accumulation and increase Lasix from 40mg  to 80mg  daily. Jason Meyer will be added for fluid management, to be taken alongside the increased Lasix dose. Lab work will be ordered to monitor kidney function and electrolyte balance. Primary osteoarthritis of both knees His severe arthritis is contributing to disability. We will continue with the current management strategy. Vision impairment He reports failing vision and plans to see an eye doctor. We encouraged him to follow up with the eye doctor as planned. Stage 3a chronic kidney disease (HCC)  Type 2 diabetes mellitus with  stage 3a chronic kidney disease, with long-term current use of insulin (HCC)    General Health Maintenance We encouraged him to get the RSV vaccine at the pharmacy. Lab work was ordered to be done today at [facility]. A follow-up appointment is scheduled in 3-4 weeks to monitor the response to medication adjustments and lab results. We initiated the process for a disability application due to chronic heart failure, severe arthritis, and vision impairment.      Diagnoses and all orders for this visit: Morbid obesity with BMI of 50.0-59.9, adult (HCC) -     tirzepatide (MOUNJARO) 10 MG/0.5ML Pen; Inject 10 mg into the skin once a week. Type 2 diabetes mellitus with other specified complication, without long-term current use of insulin (HCC) -     CBC with Differential/Platelet -     Comprehensive metabolic panel -     Lipid panel -     Hemoglobin A1c -     Microalbumin / creatinine urine ratio -     Finerenone (KERENDIA) 10 MG TABS; Take 1 tablet (10 mg total) by mouth daily. Leg swelling -     amLODipine (NORVASC) 5 MG tablet; Take 1 tablet (5 mg total) by mouth daily. Reduced dose due to leg swelling. -     furosemide (LASIX) 40 MG tablet; Take 2 tablets (80 mg total) by mouth daily. Must take with kerendia to keep potassium up. Fluid pill  Recommended follow-up: No follow-ups on file. Future Appointments  Date Time Provider Department Center  10/05/2023  1:40 PM Jason Olszewski, MD [facility]  Subjective   62 y.o. male who has Type 2 diabetes mellitus with other specified complication (HCC); Hypertension; Morbid obesity with BMI of 50.0-59.9, adult (HCC); Mixed hyperlipidemia; Venous stasis; OA (osteoarthritis); DOE (dyspnea on exertion); Chronic diastolic heart failure (HCC); Venous stasis dermatitis of both lower extremities; OSA (obstructive sleep apnea); Erectile dysfunction; Low HDL (under 40); Elevated PSA; Coronary artery calcification; Coronary artery disease;  Microcytosis; CAP (community acquired pneumonia); Low mean corpuscular volume (MCV); and On statin therapy on their problem list. His reasons/main concerns/chief complaints for today's office visit are 4 month follow-up   ------------------------------------------------------------------------------------------------------------------------ AI-Extracted: Discussed the use of AI scribe software for clinical note transcription with the patient, who gave verbal consent to proceed.  History of Present Illness   Jason Meyer, a patient with chronic heart failure, severe arthritis, and vision impairment, presents with concerns about his medication regimen. He reports that he has been taking Mounjaro 7.5mg  for the past three months and recently received another three-month supply of the same dosage. He was under the impression that his dosage was to be increased to 10mg , but this was not reflected in his recent prescription refill.  In addition to his heart condition, Jason Meyer has been struggling with weight gain, reporting a 10-pound increase since his last visit. He is currently taking Wellbutrin to help speed up his metabolism, but it does not seem to be effectively managing his weight.  Jason Meyer also reports significant leg swelling, which he believes may be a side effect of his Metformin medication. He has been taking Lasix to manage the fluid accumulation, but the swelling persists. He also mentions that he has been experiencing vision problems and plans to visit an eye doctor for further evaluation.  In the past, Jason Meyer was hospitalized due to pneumonia. He has been using a device to help improve his lung function since his hospitalization. He also reports limited mobility due to his arthritis but has been trying to increase his physical activity by walking more.  Jason Meyer expresses interest in applying for social security disability due to his health conditions. He is currently working but  finds his chronic heart failure and severe arthritis extremely disabling.      He has a past medical history of Allergy, Arthritis, Chronic diastolic heart failure (HCC) (19/14/7829), Coronary artery calcification (10/19/2022), Hyperlipidemia, Hypertension, OSA (obstructive sleep apnea) (10/19/2022), Sepsis (HCC) (04/12/2023), Sleep apnea, and Type 2 diabetes mellitus (HCC).  Problem list overviews that were updated at today's visit: No problems updated. Current Outpatient Medications on File Prior to Visit  Medication Sig   acetaminophen (TYLENOL) 325 MG tablet Take 2 tablets (650 mg total) by mouth every 6 (six) hours as needed for mild pain (or Fever >/= 101).   albuterol (PROVENTIL) (2.5 MG/3ML) 0.083% nebulizer solution Take 3 mLs (2.5 mg total) by nebulization every 6 (six) hours as needed for wheezing or shortness of breath.   albuterol (VENTOLIN HFA) 108 (90 Base) MCG/ACT inhaler Inhale 2 puffs into the lungs every 6 (six) hours as needed for wheezing or shortness of breath.   aspirin EC 81 MG tablet Take 1 tablet (81 mg total) by mouth daily. Swallow whole.   Azelastine HCl 137 MCG/SPRAY SOLN PLACE 1 SPRAY INTO BOTH NOSTRILS 2 (TWO) TIMES DAILY   bisoprolol (ZEBETA) 10 MG tablet Take 2 tablets (20 mg total) by mouth daily.   buPROPion (WELLBUTRIN XL) 150 MG 24 hr tablet TAKE 1 TABLET (150 MG TOTAL) BY MOUTH IN THE MORNING   celecoxib (  CELEBREX) 200 MG capsule TAKE 1 CAPSULE (200 MG TOTAL) BY MOUTH 2 (TWO) TIMES DAILY. REPLACES MELOXICAM   clotrimazole (LOTRIMIN) 1 % cream Apply topically daily.   clotrimazole-betamethasone (LOTRISONE) cream APPLY TO AFFECTED AREA TWICE A DAY   Evolocumab (REPATHA SURECLICK) 140 MG/ML SOAJ Inject 140 mg into the skin every 14 (fourteen) days.   fluticasone (FLONASE) 50 MCG/ACT nasal spray Place 2 sprays into both nostrils daily.   fluticasone furoate-vilanterol (BREO ELLIPTA) 100-25 MCG/ACT AEPB Inhale 1 puff into the lungs daily.   metFORMIN  (GLUCOPHAGE) 1000 MG tablet Take 1 tablet (1,000 mg total) by mouth 2 (two) times daily with a meal.   olmesartan (BENICAR) 40 MG tablet Take 1 tablet (40 mg total) by mouth daily.   ondansetron (ZOFRAN-ODT) 4 MG disintegrating tablet Take 1 tablet (4 mg total) by mouth every 8 (eight) hours as needed for nausea or vomiting.   pantoprazole (PROTONIX) 40 MG tablet Take 1 tablet (40 mg total) by mouth daily.   potassium chloride (MICRO-K) 10 MEQ CR capsule TAKE 1 CAPSULE BY MOUTH EVERY DAY   rosuvastatin (CRESTOR) 40 MG tablet Take 1 tablet (40 mg total) by mouth daily. Replaces atorvastatin which failed to reach Low Density Lipoprotein (LDL cholesterol) goal of 55, take only if repatha not approved/available.   sildenafil (REVATIO) 20 MG tablet 2-5 pills once daily as needed for erectile dysfunction   No current facility-administered medications on file prior to visit.   Medications Discontinued During This Encounter  Medication Reason   tirzepatide [facility]) 5 MG/0.5ML Pen Dose change   tirzepatide (MOUNJARO) 7.5 MG/0.5ML Pen Dose change   tirzepatide (MOUNJARO) 10 MG/0.5ML Pen Dose change   amLODipine (NORVASC) 10 MG tablet    furosemide (LASIX) 40 MG tablet      Objective   Physical Exam  BP 116/73 (BP Location: Left Arm, Patient Position: Sitting)   Pulse 68   Temp 98 F (36.7 C) (Temporal)   Ht 5\' 11"  (1.803 m)   Wt (!) 384 lb 3.2 oz (174.3 kg)   SpO2 93%   BMI 53.59 kg/m  Wt Readings from Last 10 Encounters:  09/07/23 (!) 384 lb 3.2 oz (174.3 kg)  05/08/23 (!) 375 lb 6.4 oz (170.3 kg)  04/25/23 (!) 364 lb (165.1 kg)  04/13/23 (!) 379 lb 3.1 oz (172 kg)  02/06/23 (!) 380 lb 9.6 oz (172.6 kg)  12/26/22 (!) 376 lb 3.2 oz (170.6 kg)  10/19/22 (!) 390 lb (176.9 kg)  09/14/22 (!) 389 lb 6.4 oz (176.6 kg)  08/23/22 (!) 388 lb (176 kg)  08/11/22 (!) 386 lb 6.4 oz (175.3 kg)   Vital signs reviewed.  Nursing notes reviewed. Weight trend reviewed. Abnormalities and  Problem-Specific physical exam findings:  severe truncal adiposity   General Appearance:  No acute distress appreciable.   Well-groomed, healthy-appearing male.  Well proportioned with no abnormal fat distribution.  Good muscle tone. Pulmonary:  Normal work of breathing at rest, no respiratory distress apparent. SpO2: 93 %  Musculoskeletal: All extremities are intact.  Neurological:  Awake, alert, oriented, and engaged.  No obvious focal neurological deficits or cognitive impairments.  Sensorium seems unclouded.   Speech is clear and coherent with logical content. Psychiatric:  Appropriate mood, pleasant and cooperative demeanor, thoughtful and engaged during the exam  Results            No results found for any visits on 09/07/23.  Office Visit on 05/08/2023  Component Date Value   PSA 05/08/2023 11.53 (  H)    Sodium 05/08/2023 139    Potassium 05/08/2023 3.9    Chloride 05/08/2023 105    CO2 05/08/2023 24    Glucose, Bld 05/08/2023 140 (H)    BUN 05/08/2023 13    Creatinine, Ser 05/08/2023 1.12    GFR 05/08/2023 71.43    Calcium 05/08/2023 9.0    WBC 05/08/2023 7.9    RBC 05/08/2023 5.90 (H)    Hemoglobin 05/08/2023 14.5    HCT 05/08/2023 45.5    MCV 05/08/2023 77.1 (L)    MCHC 05/08/2023 31.8    RDW 05/08/2023 18.3 (H)    Platelets 05/08/2023 196.0    Neutrophils Relative % 05/08/2023 73.8    Lymphocytes Relative 05/08/2023 18.4    Monocytes Relative 05/08/2023 5.3    Eosinophils Relative 05/08/2023 2.0    Basophils Relative 05/08/2023 0.5    Neutro Abs 05/08/2023 5.8    Lymphs Abs 05/08/2023 1.4    Monocytes Absolute 05/08/2023 0.4    Eosinophils Absolute 05/08/2023 0.2    Basophils Absolute 05/08/2023 0.0    Ferritin 05/08/2023 38.7   No results displayed because visit has over 200 results.    Office Visit on 12/26/2022  Component Date Value   WBC 12/26/2022 10.1    RBC 12/26/2022 5.71    Platelets 12/26/2022 331.0    Hemoglobin 12/26/2022 14.3    HCT  12/26/2022 44.1    MCV 12/26/2022 77.2 (L)    MCHC 12/26/2022 32.5    RDW 12/26/2022 17.2 (H)    Sodium 12/26/2022 141    Potassium 12/26/2022 4.3    Chloride 12/26/2022 103    CO2 12/26/2022 29    Glucose, Bld 12/26/2022 103 (H)    BUN 12/26/2022 11    Creatinine, Ser 12/26/2022 1.17    Total Bilirubin 12/26/2022 0.5    Alkaline Phosphatase 12/26/2022 95    AST 12/26/2022 13    ALT 12/26/2022 12    Total Protein 12/26/2022 7.2    Albumin 12/26/2022 4.0    GFR 12/26/2022 67.96    Calcium 12/26/2022 9.5    Cholesterol 12/26/2022 139    Triglycerides 12/26/2022 74.0    HDL 12/26/2022 30.50 (L)    VLDL 12/26/2022 14.8    LDL Cholesterol 12/26/2022 94    Total CHOL/HDL Ratio 12/26/2022 5    NonHDL 12/26/2022 108.76    Microalb, Ur 12/26/2022 1.7    Creatinine,U 12/26/2022 61.7    Microalb Creat Ratio 12/26/2022 2.7    MICRO NUMBER: 12/26/2022 [id number]    SPECIMEN QUALITY: 12/26/2022 Adequate    Sample Source 12/26/2022 URINE    STATUS: 12/26/2022 FINAL    ISOLATE 1: 12/26/2022 Klebsiella pneumoniae (A)    Color, UA 12/26/2022 yellow    Clarity, UA 12/26/2022 cloudy    Glucose, UA 12/26/2022 Negative    Bilirubin, UA 12/26/2022 Negative    Ketones, UA 12/26/2022 Negative    Spec Grav, UA 12/26/2022 1.015    Blood, UA 12/26/2022 Negative    pH, UA 12/26/2022 5.0    Protein, UA 12/26/2022 Negative    Urobilinogen, UA 12/26/2022 0.2    Nitrite, UA 12/26/2022 Postive    Leukocytes, UA 12/26/2022 Moderate (2+) (A)    No image results found.   No results found.  CT Angio Chest PE W and/or Wo Contrast  Result Date: 04/12/2023 CLINICAL DATA:  Cough fever EXAM: CT ANGIOGRAPHY CHEST WITH CONTRAST TECHNIQUE: Multidetector CT imaging of the chest was performed using the standard protocol during bolus administration of intravenous contrast. Multiplanar  CT image reconstructions and MIPs were obtained to evaluate the vascular anatomy. RADIATION DOSE REDUCTION: This exam was performed  according to the departmental dose-optimization program which includes automated exposure control, adjustment of the mA and/or kV according to patient size and/or use of iterative reconstruction technique. CONTRAST:  OMNIPAQUE IOHEXOL 350 MG/ML SOLN COMPARISON:  Chest xray 04/12/2023, CT chest 07/30/2022 FINDINGS: Cardiovascular: Satisfactory opacification of the pulmonary arteries to the segmental level. No evidence of pulmonary embolism. Normal heart size. No pericardial effusion.Nonaneurysmal aorta. Mediastinum/Nodes: Stable to slight increased mediastinal adenopathy for example subcarinal node measures 2.2 cm compared with 1.8 cm previously. Thyroid gland, trachea, and esophagus demonstrate no significant findings. Lungs/Pleura: Heterogenous consolidations in the left greater than right lower lobes. No pleural effusion. No pneumothorax Upper Abdomen: No acute abnormality. Musculoskeletal: No chest wall abnormality. No acute or significant osseous findings. Review of the MIP images confirms the above findings. IMPRESSION: 1. Negative for acute pulmonary embolus. 2. Heterogenous consolidations in the left greater than right lower lobes consistent with pneumonia. 3. Stable to slight increased mediastinal adenopathy, likely reactive. Electronically Signed   By: Jasmine Pang M.D.   On: 04/12/2023 02:54   DG Chest Port 1 View  Result Date: 04/12/2023 CLINICAL DATA:  Cough, chills, fever, congestion. EXAM: PORTABLE CHEST 1 VIEW COMPARISON:  Chest radiographs and CT 07/30/2022 FINDINGS: Stable cardiomediastinal silhouette. Bibasilar atelectasis or infiltrates. No pleural effusion or pneumothorax. No displaced rib fractures. IMPRESSION: Bibasilar atelectasis or infiltrates. Electronically Signed   By: Minerva Fester M.D.   On: 04/12/2023 00:51       Additional Info: This encounter employed real-time, collaborative documentation. The patient actively reviewed and updated their medical record on a shared  screen, ensuring transparency and facilitating joint problem-solving for the problem list, overview, and plan. This approach promotes accurate, informed care. The treatment plan was discussed and reviewed in detail, including medication safety, potential side effects, and all patient questions. We confirmed understanding and comfort with the plan. Follow-up instructions were established, including contacting the office for any concerns, returning if symptoms worsen, persist, or new symptoms develop, and precautions for potential emergency department visits.

## 2023-09-08 LAB — LIPID PANEL
Cholesterol: 113 mg/dL (ref <200)
HDL: 37 mg/dL — ABNORMAL LOW (ref >=40)
LDL Cholesterol (Calc): 61 mg/dL
Non-HDL Cholesterol (Calc): 76 mg/dL (ref <130)
Total CHOL/HDL Ratio: 3.1 (calc) (ref <5.0)
Triglycerides: 73 mg/dL (ref <150)

## 2023-09-08 LAB — CBC WITH DIFFERENTIAL/PLATELET
Absolute Monocytes: 751 {cells}/uL (ref 200–950)
Basophils Absolute: 57 {cells}/uL (ref 0–200)
Basophils Relative: 0.6 %
Eosinophils Absolute: 133 {cells}/uL (ref 15–500)
Eosinophils Relative: 1.4 %
HCT: 49.5 % (ref 38.5–50.0)
Hemoglobin: 15.6 g/dL (ref 13.2–17.1)
Lymphs Abs: 1957 {cells}/uL (ref 850–3900)
MCH: 24.8 pg — ABNORMAL LOW (ref 27.0–33.0)
MCHC: 31.5 g/dL — ABNORMAL LOW (ref 32.0–36.0)
MCV: 78.8 fL — ABNORMAL LOW (ref 80.0–100.0)
MPV: 10.8 fL (ref 7.5–12.5)
Monocytes Relative: 7.9 %
Neutro Abs: 6603 {cells}/uL (ref 1500–7800)
Neutrophils Relative %: 69.5 %
Platelets: 270 10*3/uL (ref 140–400)
RBC: 6.28 10*6/uL — ABNORMAL HIGH (ref 4.20–5.80)
RDW: 14.8 % (ref 11.0–15.0)
Total Lymphocyte: 20.6 %
WBC: 9.5 10*3/uL (ref 3.8–10.8)

## 2023-09-08 LAB — COMPREHENSIVE METABOLIC PANEL
AG Ratio: 1.6 (calc) (ref 1.0–2.5)
ALT: 24 U/L (ref 9–46)
AST: 21 U/L (ref 10–35)
Albumin: 4.1 g/dL (ref 3.6–5.1)
Alkaline phosphatase (APISO): 75 U/L (ref 35–144)
BUN: 14 mg/dL (ref 7–25)
CO2: 22 mmol/L (ref 20–32)
Calcium: 9.2 mg/dL (ref 8.6–10.3)
Chloride: 106 mmol/L (ref 98–110)
Creat: 1.24 mg/dL (ref 0.70–1.35)
Globulin: 2.6 g/dL (ref 1.9–3.7)
Glucose, Bld: 78 mg/dL (ref 65–99)
Potassium: 4 mmol/L (ref 3.5–5.3)
Sodium: 140 mmol/L (ref 135–146)
Total Bilirubin: 0.4 mg/dL (ref 0.2–1.2)
Total Protein: 6.7 g/dL (ref 6.1–8.1)

## 2023-09-08 LAB — HEMOGLOBIN A1C
Hgb A1c MFr Bld: 6.7 %{Hb} — ABNORMAL HIGH (ref <5.7)
Mean Plasma Glucose: 146 mg/dL
eAG (mmol/L): 8.1 mmol/L

## 2023-09-08 LAB — MICROALBUMIN / CREATININE URINE RATIO
Creatinine, Urine: 218 mg/dL (ref 20–320)
Microalb Creat Ratio: 39 mg/g{creat} — ABNORMAL HIGH (ref <30)
Microalb, Ur: 8.5 mg/dL

## 2023-09-10 ENCOUNTER — Encounter: Payer: Self-pay | Admitting: Internal Medicine

## 2023-09-10 NOTE — Progress Notes (Signed)
Reviewed labs from 09/07/2023. HbA1c increased to 6.7%, indicating suboptimal diabetes control. Lipid panel shows well-controlled LDL but low HDL. Microalbumin/creatinine ratio elevated at 39 mg/g, suggesting early diabetic nephropathy. CBC shows microcytosis and elevated RBC count. Adjusting medications for diabetes and heart failure management. Detailed explanation and plan in Patient Message.

## 2023-09-11 NOTE — Telephone Encounter (Signed)
Informed patient of lab results/notes. Scheduled for a follow-up visit on 10/25.

## 2023-10-05 ENCOUNTER — Encounter: Payer: Self-pay | Admitting: Internal Medicine

## 2023-10-05 ENCOUNTER — Ambulatory Visit (INDEPENDENT_AMBULATORY_CARE_PROVIDER_SITE_OTHER): Payer: 59 | Admitting: Internal Medicine

## 2023-10-05 VITALS — BP 146/82 | HR 70 | Temp 98.3°F | Ht 71.0 in | Wt 371.6 lb

## 2023-10-05 DIAGNOSIS — I2584 Coronary atherosclerosis due to calcified coronary lesion: Secondary | ICD-10-CM

## 2023-10-05 DIAGNOSIS — E559 Vitamin D deficiency, unspecified: Secondary | ICD-10-CM

## 2023-10-05 DIAGNOSIS — E782 Mixed hyperlipidemia: Secondary | ICD-10-CM

## 2023-10-05 DIAGNOSIS — N183 Chronic kidney disease, stage 3 unspecified: Secondary | ICD-10-CM | POA: Diagnosis not present

## 2023-10-05 DIAGNOSIS — I2510 Atherosclerotic heart disease of native coronary artery without angina pectoris: Secondary | ICD-10-CM | POA: Diagnosis not present

## 2023-10-05 DIAGNOSIS — Z0271 Encounter for disability determination: Secondary | ICD-10-CM

## 2023-10-05 DIAGNOSIS — R718 Other abnormality of red blood cells: Secondary | ICD-10-CM

## 2023-10-05 DIAGNOSIS — E1169 Type 2 diabetes mellitus with other specified complication: Secondary | ICD-10-CM

## 2023-10-05 DIAGNOSIS — M170 Bilateral primary osteoarthritis of knee: Secondary | ICD-10-CM

## 2023-10-05 DIAGNOSIS — I5032 Chronic diastolic (congestive) heart failure: Secondary | ICD-10-CM | POA: Diagnosis not present

## 2023-10-05 DIAGNOSIS — Z7985 Long-term (current) use of injectable non-insulin antidiabetic drugs: Secondary | ICD-10-CM

## 2023-10-05 NOTE — Assessment & Plan Note (Signed)
Doing better, continue(s) with spironolactone and lasix as increased  Refer care to back to prior cardiology specialist for follow up management Check BMP,

## 2023-10-06 DIAGNOSIS — E559 Vitamin D deficiency, unspecified: Secondary | ICD-10-CM | POA: Insufficient documentation

## 2023-10-06 LAB — MAGNESIUM: Magnesium: 1.9 mg/dL (ref 1.5–2.5)

## 2023-10-06 LAB — BASIC METABOLIC PANEL
BUN: 14 mg/dL (ref 7–25)
CO2: 28 mmol/L (ref 20–32)
Calcium: 9.8 mg/dL (ref 8.6–10.3)
Chloride: 102 mmol/L (ref 98–110)
Creat: 1.22 mg/dL (ref 0.70–1.35)
Glucose, Bld: 85 mg/dL (ref 65–99)
Potassium: 4.6 mmol/L (ref 3.5–5.3)
Sodium: 141 mmol/L (ref 135–146)

## 2023-10-06 LAB — URIC ACID: Uric Acid, Serum: 5.4 mg/dL (ref 4.0–8.0)

## 2023-10-06 LAB — PARATHYROID HORMONE, INTACT (NO CA): PTH: 13 pg/mL — ABNORMAL LOW (ref 16–77)

## 2023-10-06 LAB — VITAMIN D 25 HYDROXY (VIT D DEFICIENCY, FRACTURES): Vit D, 25-Hydroxy: 10 ng/mL — ABNORMAL LOW (ref 30–100)

## 2023-10-06 LAB — PHOSPHORUS: Phosphorus: 3.2 mg/dL (ref 2.5–4.5)

## 2023-10-06 NOTE — Assessment & Plan Note (Addendum)
He has severe arthritis in his knees causing disability. We will continue current management.

## 2023-10-06 NOTE — Progress Notes (Signed)
[facility]: [phone]   -- Medical Office Visit --  Patient:  Jason Meyer      Age: 62 y.o.       Sex:  male  Date:   10/05/2023 Today's Healthcare Provider: [name], MD  ============================================================================================= Assessment Plan    Assessment & Plan Chronic diastolic heart failure [facility]) Doing better, continue(s) with spironolactone and lasix as increased  Refer care to back to prior cardiology specialist for follow up management Check BMP,  Stage 3 chronic kidney disease, unspecified whether stage 3a or 3b CKD (HCC)  Vitamin D deficiency  Coronary artery disease due to calcified coronary lesion  Primary osteoarthritis of both knees He has severe arthritis in his knees causing disability. We will continue current management.  Encounter for disability examination We will complete FMLA paperwork for reduced work hours due to medical conditions, refer him to Cardiology for follow-up, and have him return in 1 month for a follow-up visit. We will consider changes in his medication regimen based on lab results.  Type 2 diabetes mellitus with other specified complication, without long-term current use of insulin (HCC) He is on Mounjaro for glucose control and weight loss. His vision issues related to diabetes are expected to improve with continued use of Mounjaro. We will continue Mounjaro, increasing to 10mg  when the current supply of 7.5mg  is finished, and check kidney function and electrolytes due to changes in fluid balance.   Diagnoses and all orders for this visit: Chronic diastolic heart failure (HCC) -     Ambulatory referral to Cardiology -     Basic Metabolic Panel (BMET); Future -     Magnesium; Future -     Phosphorus; Future -     PTH, intact (no Ca); Future -     Uric acid; Future -     Vitamin D (25 hydroxy); Future Stage 3 chronic kidney disease, unspecified whether stage 3a  or 3b CKD (HCC) -     Magnesium; Future -     Phosphorus; Future -     PTH, intact (no Ca); Future -     Uric acid; Future -     Vitamin D (25 hydroxy); Future Vitamin D deficiency -     Magnesium; Future -     Phosphorus; Future -     PTH, intact (no Ca); Future -     Uric acid; Future -     Vitamin D (25 hydroxy); Future Coronary artery disease due to calcified coronary lesion -     Ambulatory referral to Cardiology -     Basic Metabolic Panel (BMET); Future -     Magnesium; Future -     Phosphorus; Future -     PTH, intact (no Ca); Future -     Uric acid; Future -     Vitamin D (25 hydroxy); Future Primary osteoarthritis of both knees -     Magnesium; Future -     Phosphorus; Future -     PTH, intact (no Ca); Future -     Uric acid; Future -     Vitamin D (25 hydroxy); Future Encounter for disability examination Type 2 diabetes mellitus with other specified complication, without long-term current use of insulin (HCC)  Recommended follow-up: 1 month for ongoing disability and CHF support Future Appointments  Date Time Provider Department Center  11/12/2023 11:00 AM [name], MD [facility]  Patient Care Team: [name], MD as PCP - General (  Internal Medicine) Jonelle Sidle, MD as PCP - Cardiology (Cardiology) Dettinger, Elige Radon, MD as Consulting Physician (Family Medicine) Vickki Hearing, MD as Consulting Physician (Orthopedic Surgery)    Subjective   62 y.o. male who has Type 2 diabetes mellitus with other specified complication (HCC); Hypertension; Morbid obesity with BMI of 50.0-59.9, adult (HCC); Mixed hyperlipidemia; Venous stasis; OA (osteoarthritis); DOE (dyspnea on exertion); Chronic diastolic heart failure (HCC); Venous stasis dermatitis of both lower extremities; OSA (obstructive sleep apnea); Erectile dysfunction; Low HDL (under 40); Elevated PSA; Coronary artery calcification; Coronary artery disease; Microcytosis; CAP (community  acquired pneumonia); Low mean corpuscular volume (MCV); On statin therapy; Chronic kidney disease, stage 3 unspecified (HCC); and Vitamin D deficiency on their problem list.. Main reasons for visit/main concerns/chief complaint: 1 month follow-up   ------------------------------------------------------------------------------------------------------------------------ AI-Extracted: Discussed the use of AI scribe software for clinical note transcription with the patient, who gave verbal consent to proceed.  History of Present Illness   The patient, with a history of congestive heart failure, diabetes, and severe arthritis, presents for a follow-up visit. Over the past month, he has been in contact with social security regarding potential disability and FMLA support, necessitating the completion of relevant paperwork.  The patient reports an improvement in leg swelling, attributed to the increased dosage of Lasix and the addition of Spironolactone to his regimen. However, he expresses concern about the potential impact of these medications on his kidney function. He also mentions an inability to tolerate Jardiance due to recurrent UTIs and an inability to afford [place].  The patient's diabetes management includes the use of Mounjaro, which he is currently taking at a dose of 7.5, with plans to increase to 10. He also reports a history of low vitamin D levels, last checked in 2016, and expresses a willingness to start supplementation.  In terms of his arthritis, the patient mentions receiving knee injections for severe arthritis. He also reports working long hours, which exacerbates his health conditions. He is considering reducing his work hours to qualify for disability benefits.  The patient is also on Olmesartan for blood pressure control and kidney protection. He expresses a desire to strengthen his heart through exercise and weight loss, facilitated by the use of Mounjaro. He also uses a CPAP  machine, indicating a possible diagnosis of sleep apnea.  Overall, the patient's main concerns revolve around managing his multiple chronic conditions, the potential side effects of his medications, and navigating the process of applying for disability benefits.       Note that patient  has a past medical history of Allergy, Arthritis, Chronic diastolic heart failure (HCC) (28/31/5176), Coronary artery calcification (10/19/2022), Hyperlipidemia, Hypertension, OSA (obstructive sleep apnea) (10/19/2022), Sepsis (HCC) (04/12/2023), Sleep apnea, and Type 2 diabetes mellitus (HCC).  Problem list overviews that were updated at today's visit: Problem  Vitamin D Deficiency  Chronic Diastolic Heart Failure (Hcc)   Leg swelling has improved with Lasix and spironolactone he was not able to get [place] due to insurance would not covered so we used spironolactone to get better fluid off of him although we are concerned about diabetic kidney disease he can only tolerate olmesartan because Jardiance causes UTIs and [place] is not affordable Completed Family Medical Leave Act (FMLA)  09/07/2023: Patient presented with significant leg swelling. CBC shows elevated RBC count (6.28 million/uL) and low MCV (78.8 fL), potentially indicating relative polycythemia due to chronic heart failure. Plan: Reduce Amlodipine from 10mg  to 5mg  daily, increase Lasix from 40mg  to 80mg  daily,  and add [place] for fluid management. Monitor kidney function and electrolytes closely. 08/11/2022: Diagnosed with chronic diastolic heart failure. Echocardiogram on 07/31/2022 showed normal LV function (EF 60-65%), moderate LV hypertrophy, and normal diastolic parameters. CT angiography noted cardiac enlargement and bilateral pleural effusions.  History:  Admitted with acute hypoxemic respiratory failure in the setting of acute diastolic CHF exacerbation Noted on CTA  which also noted Cardiac enlargement, bilateral pleural effusions and  diffuse ground-glass attenuation suggestive of mild CHF. Chronic ble leg swelling on lasix.  Probably needs spironolactone  Result Date: 07/31/2022    ECHOCARDIOGRAM REPORT    IMPRESSIONS  1. Left ventricular ejection fraction, by estimation, is 60 to 65%. The left ventricle has normal function. The left ventricle has no regional wall motion abnormalities. There is moderate left ventricular hypertrophy. Left ventricular diastolic parameters were normal.  2. Right ventricular systolic function was not well visualized. The right ventricular size is not well visualized. Tricuspid regurgitation signal is inadequate for assessing PA pressure.  3. The mitral valve was not well visualized. No evidence of mitral valve regurgitation. No evidence of mitral stenosis.  4. The aortic valve was not well visualized. Aortic valve regurgitation is not visualized. No aortic stenosis is present.  5. The inferior vena cava is dilated in size with >50% respiratory variability, suggesting right atrial pressure of 8 mmHg. FINDINGS  Left Ventricle: Left ventricular ejection fraction, by estimation, is 60 to 65%. The left ventricle has normal function. The left ventricle has no regional wall motion abnormalities. Definity contrast agent was given IV to delineate the left ventricular  endocardial borders. The left ventricular internal cavity size was normal in size. There is moderate left ventricular hypertrophy. Left ventricular diastolic parameters were normal. Right Ventricle: The right ventricular size is not well visualized. Right vetricular wall thickness was not well visualized. Right ventricular systolic function was not well visualized. Tricuspid regurgitation signal is inadequate for assessing PA pressure. Left Atrium: Left atrial size was not well visualized. Right Atrium: Right atrial size was not well visualized. Pericardium: There is no evidence of pericardial effusion. Mitral Valve: The mitral valve was not well  visualized. No evidence of mitral valve regurgitation. No evidence of mitral valve stenosis. Tricuspid Valve: The tricuspid valve is not well visualized. Tricuspid valve regurgitation is not demonstrated. No evidence of tricuspid stenosis. Aortic Valve: The aortic valve was not well visualized. Aortic valve regurgitation is not visualized. No aortic stenosis is present. Aortic valve mean gradient measures 9.0 mmHg. Aortic valve peak gradient measures 16.6 mmHg. Aortic valve area, by VTI measures 3.07 cm. Pulmonic Valve: The pulmonic valve was not well visualized. Pulmonic valve regurgitation is not visualized. No evidence of pulmonic stenosis. Aorta: The aortic root is normal in size and structure. Venous: The inferior vena cava is dilated in size with greater than 50% respiratory variability, suggesting right atrial pressure of 8 mmHg. IAS/Shunts: No atrial level shunt detected by color flow Doppler.  LEFT VENTRICLE PLAX 2D LVIDd:         5.20 cm   Diastology LVIDs:         3.35 cm   LV e' medial:    10.20 cm/s LV PW:         1.35 cm   LV E/e' medial:  10.1 LV IVS:        1.40 cm   LV e' lateral:   11.90 cm/s LVOT diam:     2.30 cm   LV E/e' lateral: 8.7 LV SV:  132 LV SV Index:   47 LVOT Area:     4.15 cm  RIGHT VENTRICLE RV S prime:     11.60 cm/s TAPSE (M-mode): 3.0 cm LEFT ATRIUM             Index LA diam:        3.75 cm 1.34 cm/m LA Vol (A2C):   87.6 ml 31.26 ml/m LA Vol (A4C):   84.2 ml 30.05 ml/m LA Biplane Vol: 91.4 ml 32.62 ml/m  AORTIC VALVE AV Area (Vmax):    2.77 cm AV Area (Vmean):   2.94 cm AV Area (VTI):     3.07 cm AV Vmax:           204.00 cm/s AV Vmean:          132.000 cm/s AV VTI:            0.431 m AV Peak Grad:      16.6 mmHg AV Mean Grad:      9.0 mmHg LVOT Vmax:         136.00 cm/s LVOT Vmean:        93.400 cm/s LVOT VTI:          0.318 m LVOT/AV VTI ratio: 0.74  AORTA Ao Root diam: 3.30 cm MITRAL VALVE MV Area (PHT): 3.08 cm     SHUNTS MV Decel Time: 246 msec     Systemic  VTI:  0.32 m MV E velocity: 103.00 cm/s  Systemic Diam: 2.30 cm MV A velocity: 93.80 cm/s MV E/A ratio:    Oa (Osteoarthritis)   09/07/2023: No new complaints regarding osteoarthritis. Chronic and disabling. Last intervention was knee injections in September 2023 with some improvement. Continue current management plan.  Was seen by sports medicine and had injections in both knees in September 2023 and it helped some  XR 08/2022  of each knee X-ray shows 9 degree varus deformity grade 4 arthritis noted medial compartment with extensive joint space narrowing and the patient has significant peripheral osteophytes  Impression grade 4 arthritis with moderate varus deformity  Xray left shoulder 2016 Acromioclavicular glenohumeral degenerative change. No acute abnormality.   On: 08/19/2015 12:45   Type 2 Diabetes Mellitus With Other Specified Complication (Hcc)   09/07/2023: HbA1c increased to 6.7% from 6.0% in May 2024, indicating suboptimal glycemic control. Patient reports good compliance with Mounjaro 7.5mg  weekly and metformin 1000mg  BID. Lipid panel shows persistently low HDL at 37 mg/dL. Urine microalbumin/creatinine ratio elevated at 39 mg/g, indicating early diabetic nephropathy. Plan: Increase Mounjaro to 10mg  weekly, continue metformin, emphasize lifestyle modifications, and recheck HbA1c in 3 months. 05/08/2023: HbA1c improved to 6.0%. Patient reported good compliance with Mounjaro 7.5mg  and metformin 1000mg  BID. Home glucose readings not being taken. Diabetes Composite Score: 4/5.      Labs and Risk Scores: Lab Results  Component Value Date   HGBA1C 6.7 (H) 09/07/2023   HGBA1C 6.0 (H) 04/12/2023   HGBA1C 5.9 (H) 07/14/2022   HGBA1C 6.3 (H) 03/29/2022   HGBA1C 6.2 (H) 02/10/2022   Lab Results  Component Value Date   LDLCALC 61 09/07/2023   Lab Results  Component Value Date   LABMICR See below: 06/29/2020   LABMICR See below: 10/10/2018   MICROALBUR 8.5 09/07/2023    MICROALBUR 1.7 12/26/2022   Diabetes Composite Score: 4  Values used to calculate this score:   Points  Metrics      0        Blood Pressure: 146/82  1        Prescribed Statins: Yes      1        Hemoglobin A1c: 6.7 % of total Hgb      1        Smokes Tobacco: No      1        Prescribed Aspirin: Yes      Health Maintenance: Diabetes Health Maintenance Due  Topic Date Due   FOOT EXAM  09/15/2021   OPHTHALMOLOGY EXAM  09/24/2021   HEMOGLOBIN A1C  03/06/2024   No foot exam found Diabetic Foot Exam - Simple   No data filed    Care Team Ophthalmologist:  No care team member to display      Med reconciliation: Current Outpatient Medications on File Prior to Visit  Medication Sig   acetaminophen (TYLENOL) 325 MG tablet Take 2 tablets (650 mg total) by mouth every 6 (six) hours as needed for mild pain (or Fever >/= 101).   albuterol (PROVENTIL) (2.5 MG/3ML) 0.083% nebulizer solution Take 3 mLs (2.5 mg total) by nebulization every 6 (six) hours as needed for wheezing or shortness of breath.   albuterol (VENTOLIN HFA) 108 (90 Base) MCG/ACT inhaler Inhale 2 puffs into the lungs every 6 (six) hours as needed for wheezing or shortness of breath.   amLODipine (NORVASC) 5 MG tablet Take 1 tablet (5 mg total) by mouth daily. Reduced dose due to leg swelling.   ASPIRIN EC 81 MG EC tablet Take 81 mg by mouth daily.   Azelastine HCl 137 MCG/SPRAY SOLN PLACE 1 SPRAY INTO BOTH NOSTRILS 2 (TWO) TIMES DAILY   bisoprolol (ZEBETA) 10 MG tablet Take 2 tablets (20 mg total) by mouth daily.   buPROPion (WELLBUTRIN XL) 150 MG 24 hr tablet TAKE 1 TABLET (150 MG TOTAL) BY MOUTH IN THE MORNING   celecoxib (CELEBREX) 200 MG capsule TAKE 1 CAPSULE (200 MG TOTAL) BY MOUTH 2 (TWO) TIMES DAILY. REPLACES MELOXICAM   clotrimazole (LOTRIMIN) 1 % cream Apply topically daily.   clotrimazole-betamethasone (LOTRISONE) cream APPLY TO AFFECTED AREA TWICE A DAY   Evolocumab (REPATHA SURECLICK) 140 MG/ML SOAJ  Inject 140 mg into the skin every 14 (fourteen) days.   fluticasone (FLONASE) 50 MCG/ACT nasal spray Place 2 sprays into both nostrils daily.   fluticasone furoate-vilanterol (BREO ELLIPTA) 100-25 MCG/ACT AEPB Inhale 1 puff into the lungs daily.   furosemide (LASIX) 40 MG tablet Take 2 tablets (80 mg total) by mouth daily. Must take with kerendia to keep potassium up. Fluid pill   metFORMIN (GLUCOPHAGE) 1000 MG tablet Take 1 tablet (1,000 mg total) by mouth 2 (two) times daily with a meal.   olmesartan (BENICAR) 40 MG tablet Take 1 tablet (40 mg total) by mouth daily.   ondansetron (ZOFRAN-ODT) 4 MG disintegrating tablet Take 1 tablet (4 mg total) by mouth every 8 (eight) hours as needed for nausea or vomiting.   pantoprazole (PROTONIX) 40 MG tablet Take 1 tablet (40 mg total) by mouth daily.   potassium chloride (MICRO-K) 10 MEQ CR capsule TAKE 1 CAPSULE BY MOUTH EVERY DAY   rosuvastatin (CRESTOR) 40 MG tablet Take 1 tablet (40 mg total) by mouth daily. Replaces atorvastatin which failed to reach Low Density Lipoprotein (LDL cholesterol) goal of 55, take only if repatha not approved/available.   sildenafil (REVATIO) 20 MG tablet 2-5 pills once daily as needed for erectile dysfunction   spironolactone (ALDACTONE) 25 MG tablet Take 1 tablet (25 mg total) by mouth  daily.   tirzepatide (MOUNJARO) 10 MG/0.5ML Pen Inject 10 mg into the skin once a week.   No current facility-administered medications on file prior to visit.   Medications Discontinued During This Encounter  Medication Reason   aspirin EC 81 MG tablet      Objective   Physical Exam  BP (!) 146/82 (BP Location: Right Arm, Patient Position: Sitting)   Pulse 70   Temp 98.3 F (36.8 C) (Temporal)   Ht 5\' 11"  (1.803 m)   Wt (!) 371 lb 9.6 oz (168.6 kg)   SpO2 93%   BMI 51.83 kg/m  Wt Readings from Last 10 Encounters:  10/05/23 (!) 371 lb 9.6 oz (168.6 kg)  09/07/23 (!) 384 lb 3.2 oz (174.3 kg)  05/08/23 (!) 375 lb 6.4 oz (170.3  kg)  04/25/23 (!) 364 lb (165.1 kg)  04/13/23 (!) 379 lb 3.1 oz (172 kg)  02/06/23 (!) 380 lb 9.6 oz (172.6 kg)  12/26/22 (!) 376 lb 3.2 oz (170.6 kg)  10/19/22 (!) 390 lb (176.9 kg)  09/14/22 (!) 389 lb 6.4 oz (176.6 kg)  08/23/22 (!) 388 lb (176 kg)   Vital signs reviewed.  Nursing notes reviewed. Weight trend reviewed. Abnormalities and Problem-Specific physical exam findings:  swelling in legs persistent    General Appearance:  No acute distress appreciable.   Well-groomed, healthy-appearing male.  Well proportioned with no abnormal fat distribution.  Good muscle tone. Pulmonary:  Normal work of breathing at rest, no respiratory distress apparent. SpO2: 93 %  Musculoskeletal: All extremities are intact.  Neurological:  Awake, alert, oriented, and engaged.  No obvious focal neurological deficits or cognitive impairments.  Sensorium seems unclouded.   Speech is clear and coherent with logical content. Psychiatric:  Appropriate mood, pleasant and cooperative demeanor, thoughtful and engaged during the exam  Results   LABS MCV: low Vitamin D: 9.1 Proteinuria: trace        Results for orders placed or performed in visit on 10/05/23  Vitamin D (25 hydroxy)  Result Value Ref Range   Vit D, 25-Hydroxy 10 (L) 30 - 100 ng/mL  Uric acid  Result Value Ref Range   Uric Acid, Serum 5.4 4.0 - 8.0 mg/dL  Phosphorus  Result Value Ref Range   Phosphorus 3.2 2.5 - 4.5 mg/dL  Magnesium  Result Value Ref Range   Magnesium 1.9 1.5 - 2.5 mg/dL  Basic Metabolic Panel (BMET)  Result Value Ref Range   Glucose, Bld 85 65 - 99 mg/dL   BUN 14 7 - 25 mg/dL   Creat 1.19 1.47 - 8.29 mg/dL   BUN/Creatinine Ratio SEE NOTE: 6 - 22 (calc)   Sodium 141 135 - 146 mmol/L   Potassium 4.6 3.5 - 5.3 mmol/L   Chloride 102 98 - 110 mmol/L   CO2 28 20 - 32 mmol/L   Calcium 9.8 8.6 - 10.3 mg/dL    Office Visit on 56/21/3086  Component Date Value   Vit D, 25-Hydroxy 10/05/2023 10 (L)    Uric Acid, Serum  10/05/2023 5.4    Phosphorus 10/05/2023 3.2    Magnesium 10/05/2023 1.9    Glucose, Bld 10/05/2023 85    BUN 10/05/2023 14    Creat 10/05/2023 1.22    BUN/Creatinine Ratio 10/05/2023 SEE NOTE:    Sodium 10/05/2023 141    Potassium 10/05/2023 4.6    Chloride 10/05/2023 102    CO2 10/05/2023 28    Calcium 10/05/2023 9.8   Office Visit on 09/07/2023  Component  Date Value   WBC 09/07/2023 9.5    RBC 09/07/2023 6.28 (H)    Hemoglobin 09/07/2023 15.6    HCT 09/07/2023 49.5    MCV 09/07/2023 78.8 (L)    MCH 09/07/2023 24.8 (L)    MCHC 09/07/2023 31.5 (L)    RDW 09/07/2023 14.8    Platelets 09/07/2023 270    MPV 09/07/2023 10.8    Neutro Abs 09/07/2023 6,603    Lymphs Abs 09/07/2023 1,957    Absolute Monocytes 09/07/2023 751    Eosinophils Absolute 09/07/2023 133    Basophils Absolute 09/07/2023 57    Neutrophils Relative % 09/07/2023 69.5    Total Lymphocyte 09/07/2023 20.6    Monocytes Relative 09/07/2023 7.9    Eosinophils Relative 09/07/2023 1.4    Basophils Relative 09/07/2023 0.6    Glucose, Bld 09/07/2023 78    BUN 09/07/2023 14    Creat 09/07/2023 1.24    BUN/Creatinine Ratio 09/07/2023 SEE NOTE:    Sodium 09/07/2023 140    Potassium 09/07/2023 4.0    Chloride 09/07/2023 106    CO2 09/07/2023 22    Calcium 09/07/2023 9.2    Total Protein 09/07/2023 6.7    Albumin 09/07/2023 4.1    Globulin 09/07/2023 2.6    AG Ratio 09/07/2023 1.6    Total Bilirubin 09/07/2023 0.4    Alkaline phosphatase (AP* 09/07/2023 75    AST 09/07/2023 21    ALT 09/07/2023 24    Cholesterol 09/07/2023 113    HDL 09/07/2023 37 (L)    Triglycerides 09/07/2023 73    LDL Cholesterol (Calc) 09/07/2023 61    Total CHOL/HDL Ratio 09/07/2023 3.1    Non-HDL Cholesterol (Cal* 09/07/2023 76    Hgb A1c MFr Bld 09/07/2023 6.7 (H)    Mean Plasma Glucose 09/07/2023 146    eAG (mmol/L) 09/07/2023 8.1    Creatinine, Urine 09/07/2023 218    Microalb, Ur 09/07/2023 8.5    Microalb Creat Ratio  09/07/2023 39 (H)   Office Visit on 05/08/2023  Component Date Value   PSA 05/08/2023 11.53 (H)    Sodium 05/08/2023 139    Potassium 05/08/2023 3.9    Chloride 05/08/2023 105    CO2 05/08/2023 24    Glucose, Bld 05/08/2023 140 (H)    BUN 05/08/2023 13    Creatinine, Ser 05/08/2023 1.12    GFR 05/08/2023 71.43    Calcium 05/08/2023 9.0    WBC 05/08/2023 7.9    RBC 05/08/2023 5.90 (H)    Hemoglobin 05/08/2023 14.5    HCT 05/08/2023 45.5    MCV 05/08/2023 77.1 (L)    MCHC 05/08/2023 31.8    RDW 05/08/2023 18.3 (H)    Platelets 05/08/2023 196.0    Neutrophils Relative % 05/08/2023 73.8    Lymphocytes Relative 05/08/2023 18.4    Monocytes Relative 05/08/2023 5.3    Eosinophils Relative 05/08/2023 2.0    Basophils Relative 05/08/2023 0.5    Neutro Abs 05/08/2023 5.8    Lymphs Abs 05/08/2023 1.4    Monocytes Absolute 05/08/2023 0.4    Eosinophils Absolute 05/08/2023 0.2    Basophils Absolute 05/08/2023 0.0    Ferritin 05/08/2023 38.7   No results displayed because visit has over 200 results.    Office Visit on 12/26/2022  Component Date Value   WBC 12/26/2022 10.1    RBC 12/26/2022 5.71    Platelets 12/26/2022 331.0    Hemoglobin 12/26/2022 14.3    HCT 12/26/2022 44.1    MCV 12/26/2022 77.2 (L)  MCHC 12/26/2022 32.5    RDW 12/26/2022 17.2 (H)    Sodium 12/26/2022 141    Potassium 12/26/2022 4.3    Chloride 12/26/2022 103    CO2 12/26/2022 29    Glucose, Bld 12/26/2022 103 (H)    BUN 12/26/2022 11    Creatinine, Ser 12/26/2022 1.17    Total Bilirubin 12/26/2022 0.5    Alkaline Phosphatase 12/26/2022 95    AST 12/26/2022 13    ALT 12/26/2022 12    Total Protein 12/26/2022 7.2    Albumin 12/26/2022 4.0    GFR 12/26/2022 67.96    Calcium 12/26/2022 9.5    Cholesterol 12/26/2022 139    Triglycerides 12/26/2022 74.0    HDL 12/26/2022 30.50 (L)    VLDL 12/26/2022 14.8    LDL Cholesterol 12/26/2022 94    Total CHOL/HDL Ratio 12/26/2022 5    NonHDL 12/26/2022  108.76    Microalb, Ur 12/26/2022 1.7    Creatinine,U 12/26/2022 61.7    Microalb Creat Ratio 12/26/2022 2.7    MICRO NUMBER: 12/26/2022 [id number]    SPECIMEN QUALITY: 12/26/2022 Adequate    Sample Source 12/26/2022 URINE    STATUS: 12/26/2022 FINAL    ISOLATE 1: 12/26/2022 Klebsiella pneumoniae (A)    Color, UA 12/26/2022 yellow    Clarity, UA 12/26/2022 cloudy    Glucose, UA 12/26/2022 Negative    Bilirubin, UA 12/26/2022 Negative    Ketones, UA 12/26/2022 Negative    Spec Grav, UA 12/26/2022 1.015    Blood, UA 12/26/2022 Negative    pH, UA 12/26/2022 5.0    Protein, UA 12/26/2022 Negative    Urobilinogen, UA 12/26/2022 0.2    Nitrite, UA 12/26/2022 Postive    Leukocytes, UA 12/26/2022 Moderate (2+) (A)    No image results found.   No results found.  CT Angio Chest PE W and/or Wo Contrast  Result Date: 04/12/2023 CLINICAL DATA:  Cough fever EXAM: CT ANGIOGRAPHY CHEST WITH CONTRAST TECHNIQUE: Multidetector CT imaging of the chest was performed using the standard protocol during bolus administration of intravenous contrast. Multiplanar CT image reconstructions and MIPs were obtained to evaluate the vascular anatomy. RADIATION DOSE REDUCTION: This exam was performed according to the departmental dose-optimization program which includes automated exposure control, adjustment of the mA and/or kV according to patient size and/or use of iterative reconstruction technique. CONTRAST:  OMNIPAQUE IOHEXOL 350 MG/ML SOLN COMPARISON:  Chest xray 04/12/2023, CT chest 07/30/2022 FINDINGS: Cardiovascular: Satisfactory opacification of the pulmonary arteries to the segmental level. No evidence of pulmonary embolism. Normal heart size. No pericardial effusion.Nonaneurysmal aorta. Mediastinum/Nodes: Stable to slight increased mediastinal adenopathy for example subcarinal node measures 2.2 cm compared with 1.8 cm previously. Thyroid gland, trachea, and esophagus demonstrate no significant findings.  Lungs/Pleura: Heterogenous consolidations in the left greater than right lower lobes. No pleural effusion. No pneumothorax Upper Abdomen: No acute abnormality. Musculoskeletal: No chest wall abnormality. No acute or significant osseous findings. Review of the MIP images confirms the above findings. IMPRESSION: 1. Negative for acute pulmonary embolus. 2. Heterogenous consolidations in the left greater than right lower lobes consistent with pneumonia. 3. Stable to slight increased mediastinal adenopathy, likely reactive. Electronically Signed   By: Jasmine Pang M.D.   On: 04/12/2023 02:54   DG Chest Port 1 View  Result Date: 04/12/2023 CLINICAL DATA:  Cough, chills, fever, congestion. EXAM: PORTABLE CHEST 1 VIEW COMPARISON:  Chest radiographs and CT 07/30/2022 FINDINGS: Stable cardiomediastinal silhouette. Bibasilar atelectasis or infiltrates. No pleural effusion or pneumothorax. No displaced rib fractures.  IMPRESSION: Bibasilar atelectasis or infiltrates. Electronically Signed   By: Minerva Fester M.D.   On: 04/12/2023 00:51       Additional Info: This encounter employed real-time, collaborative documentation. The patient actively reviewed and updated their medical record on a shared screen, ensuring transparency and facilitating joint problem-solving for the problem list, overview, and plan. This approach promotes accurate, informed care. The treatment plan was discussed and reviewed in detail, including medication safety, potential side effects, and all patient questions. We confirmed understanding and comfort with the plan. Follow-up instructions were established, including contacting the office for any concerns, returning if symptoms worsen, persist, or new symptoms develop, and precautions for potential emergency department visits.

## 2023-10-06 NOTE — Patient Instructions (Signed)
VISIT SUMMARY:  During your follow-up visit, we discussed your ongoing management of congestive heart failure, diabetes, and severe arthritis. You reported improvements in leg swelling and shared concerns about medication side effects and affordability. We also addressed your diabetes management, arthritis treatment, and vitamin D deficiency. Additionally, we talked about your work situation and the process of applying for disability benefits.  YOUR PLAN:  -CONGESTIVE HEART FAILURE: Congestive heart failure is a condition where the heart doesn't pump blood as well as it should. Your leg swelling has improved with the increased dosage of Lasix and the addition of Spironolactone. We will continue your current medications and order labs to check your kidney function and electrolytes. We will also attempt to obtain Chauncey Mann again for kidney protection.  -DIABETES MELLITUS: Diabetes mellitus is a condition that affects how your body processes blood sugar. You are currently on Mounjaro for glucose control and weight loss. We will continue Mounjaro, increasing to 10mg  when your current supply of 7.5mg  is finished. We will also check your kidney function and electrolytes due to changes in fluid balance.  -ARTHRITIS: Arthritis is inflammation of the joints, causing pain and disability. 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  -VITAMIN D DEFICIENCY: Vitamin D deficiency means you have low levels of vitamin D, which is important for bone health. We will order labs to check your current Vitamin D levels and advise you to start daily Vitamin D supplementation.  -GENERAL HEALTH MAINTENANCE: We will complete your FMLA paperwork for reduced work hours due to your medical conditions. You will be referred to Cardiology for follow-up, and we will see you again in 1 month for a follow-up visit. We will consider changes in your medication regimen based on lab  results.  INSTRUCTIONS:  Please complete the lab tests for kidney function, electrolytes, and Vitamin D levels as soon as possible. Continue taking your medications as prescribed. Increase your Mounjaro dose to 10mg  once your current supply of 7.5mg  is finished. We will attempt to obtain [facility] for you again. Follow up with Cardiology as referred, and return for a follow-up visit in 1 month.

## 2023-10-06 NOTE — Assessment & Plan Note (Signed)
His Vitamin D levels were significantly low when last checked in 2016. We will order labs to check current Vitamin D levels and advise daily Vitamin D supplementation.

## 2023-10-06 NOTE — Assessment & Plan Note (Addendum)
He is on Mounjaro for glucose control and weight loss. His vision issues related to diabetes are expected to improve with continued use of Mounjaro. We will continue Mounjaro, increasing to 10mg  when the current supply of 7.5mg  is finished, and check kidney function and electrolytes due to changes in fluid balance.

## 2023-10-07 ENCOUNTER — Encounter: Payer: Self-pay | Admitting: Internal Medicine

## 2023-10-07 MED ORDER — VITAMIN D (ERGOCALCIFEROL) 1.25 MG (50000 UNIT) PO CAPS: 50000.0000 [IU] | ORAL_CAPSULE | ORAL | 1 refills | Status: DC

## 2023-10-07 NOTE — Addendum Note (Signed)
Addended by: [name] on: 10/07/2023 03:47 PM   Modules accepted: Orders

## 2023-10-10 ENCOUNTER — Telehealth: Payer: Self-pay | Admitting: Internal Medicine

## 2023-10-10 NOTE — Telephone Encounter (Signed)
Patient called stating his employer stated FMLA paperwork has different work hour times as recommended by PCP. States this needs to be clarified either verbally or by having the paperwork corrected and sent back. Pt requests to speak with Tiffany.

## 2023-10-11 NOTE — Telephone Encounter (Signed)
Spoke with Marylene Land from HR concerning this. Gave verbal clarification.

## 2023-10-12 NOTE — Telephone Encounter (Signed)
Spoke with Hansel Starling, she is faxing the form to be corrected and faxed back.

## 2023-10-12 NOTE — Telephone Encounter (Signed)
Adrienne with HR Dept at [employer] employer requests [employer] on FMLA forms be Faxed to [employer] at [employer] [phone]

## 2023-10-16 NOTE — Telephone Encounter (Signed)
Made corrections and faxed forms back to [employer].

## 2023-10-17 NOTE — Telephone Encounter (Signed)
Informed patient of lab results/notes.

## 2023-10-30 ENCOUNTER — Other Ambulatory Visit: Payer: Self-pay | Admitting: Internal Medicine

## 2023-10-30 DIAGNOSIS — I872 Venous insufficiency (chronic) (peripheral): Secondary | ICD-10-CM

## 2023-11-12 ENCOUNTER — Ambulatory Visit: Payer: 59 | Admitting: Internal Medicine

## 2023-11-14 ENCOUNTER — Ambulatory Visit: Payer: 59 | Admitting: Internal Medicine

## 2023-11-14 ENCOUNTER — Encounter: Payer: Self-pay | Admitting: Internal Medicine

## 2023-11-14 VITALS — BP 134/73 | HR 82 | Temp 97.9°F | Ht 71.0 in | Wt 370.0 lb

## 2023-11-14 DIAGNOSIS — N529 Male erectile dysfunction, unspecified: Secondary | ICD-10-CM

## 2023-11-14 DIAGNOSIS — E782 Mixed hyperlipidemia: Secondary | ICD-10-CM

## 2023-11-14 DIAGNOSIS — I5032 Chronic diastolic (congestive) heart failure: Secondary | ICD-10-CM | POA: Diagnosis not present

## 2023-11-14 DIAGNOSIS — K219 Gastro-esophageal reflux disease without esophagitis: Secondary | ICD-10-CM

## 2023-11-14 DIAGNOSIS — R972 Elevated prostate specific antigen [PSA]: Secondary | ICD-10-CM

## 2023-11-14 DIAGNOSIS — I878 Other specified disorders of veins: Secondary | ICD-10-CM

## 2023-11-14 DIAGNOSIS — Z6841 Body Mass Index (BMI) 40.0 and over, adult: Secondary | ICD-10-CM

## 2023-11-14 DIAGNOSIS — Z0271 Encounter for disability determination: Secondary | ICD-10-CM

## 2023-11-14 DIAGNOSIS — R0602 Shortness of breath: Secondary | ICD-10-CM

## 2023-11-14 DIAGNOSIS — R718 Other abnormality of red blood cells: Secondary | ICD-10-CM

## 2023-11-14 DIAGNOSIS — M7989 Other specified soft tissue disorders: Secondary | ICD-10-CM

## 2023-11-14 DIAGNOSIS — E1169 Type 2 diabetes mellitus with other specified complication: Secondary | ICD-10-CM

## 2023-11-14 DIAGNOSIS — H547 Unspecified visual loss: Secondary | ICD-10-CM

## 2023-11-14 DIAGNOSIS — E786 Lipoprotein deficiency: Secondary | ICD-10-CM

## 2023-11-14 DIAGNOSIS — E785 Hyperlipidemia, unspecified: Secondary | ICD-10-CM

## 2023-11-14 DIAGNOSIS — J449 Chronic obstructive pulmonary disease, unspecified: Secondary | ICD-10-CM

## 2023-11-14 DIAGNOSIS — Z79899 Other long term (current) drug therapy: Secondary | ICD-10-CM

## 2023-11-14 DIAGNOSIS — E559 Vitamin D deficiency, unspecified: Secondary | ICD-10-CM

## 2023-11-14 DIAGNOSIS — Z7985 Long-term (current) use of injectable non-insulin antidiabetic drugs: Secondary | ICD-10-CM

## 2023-11-14 DIAGNOSIS — J309 Allergic rhinitis, unspecified: Secondary | ICD-10-CM

## 2023-11-14 DIAGNOSIS — G4733 Obstructive sleep apnea (adult) (pediatric): Secondary | ICD-10-CM

## 2023-11-14 DIAGNOSIS — N1831 Chronic kidney disease, stage 3a: Secondary | ICD-10-CM

## 2023-11-14 DIAGNOSIS — Z5987 Material hardship due to limited financial resources, not elsewhere classified: Secondary | ICD-10-CM

## 2023-11-14 DIAGNOSIS — I872 Venous insufficiency (chronic) (peripheral): Secondary | ICD-10-CM

## 2023-11-14 DIAGNOSIS — R0609 Other forms of dyspnea: Secondary | ICD-10-CM

## 2023-11-14 DIAGNOSIS — R110 Nausea: Secondary | ICD-10-CM

## 2023-11-14 DIAGNOSIS — I2510 Atherosclerotic heart disease of native coronary artery without angina pectoris: Secondary | ICD-10-CM

## 2023-11-14 DIAGNOSIS — E662 Morbid (severe) obesity with alveolar hypoventilation: Secondary | ICD-10-CM

## 2023-11-14 DIAGNOSIS — E1159 Type 2 diabetes mellitus with other circulatory complications: Secondary | ICD-10-CM

## 2023-11-14 DIAGNOSIS — M170 Bilateral primary osteoarthritis of knee: Secondary | ICD-10-CM

## 2023-11-14 DIAGNOSIS — I152 Hypertension secondary to endocrine disorders: Secondary | ICD-10-CM

## 2023-11-14 MED ORDER — SPIRONOLACTONE 25 MG PO TABS: 25.0000 mg | ORAL_TABLET | Freq: Every day | ORAL | 3 refills | Status: DC

## 2023-11-14 MED ORDER — CLOTRIMAZOLE 1 % EX CREA: TOPICAL_CREAM | Freq: Every day | CUTANEOUS | 3 refills | Status: AC

## 2023-11-14 MED ORDER — METFORMIN HCL 1000 MG PO TABS: 1000.0000 mg | ORAL_TABLET | Freq: Two times a day (BID) | ORAL | 3 refills | Status: DC

## 2023-11-14 MED ORDER — PEPCID COMPLETE 10-800-165 MG PO CHEW: 1.0000 | CHEWABLE_TABLET | Freq: Two times a day (BID) | ORAL | 11 refills | Status: AC | PRN

## 2023-11-14 MED ORDER — BUPROPION HCL ER (XL) 150 MG PO TB24: 150.0000 mg | ORAL_TABLET | Freq: Every day | ORAL | 3 refills | Status: DC

## 2023-11-14 MED ORDER — ROSUVASTATIN CALCIUM 40 MG PO TABS: 40.0000 mg | ORAL_TABLET | Freq: Every day | ORAL | 3 refills | Status: DC

## 2023-11-14 MED ORDER — REPATHA SURECLICK 140 MG/ML ~~LOC~~ SOAJ: 140.0000 mg | SUBCUTANEOUS | 2 refills | Status: DC

## 2023-11-14 MED ORDER — FUROSEMIDE 40 MG PO TABS: 80.0000 mg | ORAL_TABLET | Freq: Every day | ORAL | 3 refills | Status: AC

## 2023-11-14 MED ORDER — OLMESARTAN MEDOXOMIL 40 MG PO TABS: 40.0000 mg | ORAL_TABLET | Freq: Every day | ORAL | 11 refills | Status: DC

## 2023-11-14 MED ORDER — ONDANSETRON 4 MG PO TBDP: 4.0000 mg | ORAL_TABLET | Freq: Three times a day (TID) | ORAL | 0 refills | Status: DC | PRN

## 2023-11-14 MED ORDER — FLUTICASONE FUROATE-VILANTEROL 100-25 MCG/ACT IN AEPB: 1.0000 | INHALATION_SPRAY | Freq: Every day | RESPIRATORY_TRACT | 11 refills | Status: AC

## 2023-11-14 MED ORDER — AMLODIPINE BESYLATE 5 MG PO TABS: 5.0000 mg | ORAL_TABLET | Freq: Every day | ORAL | 3 refills | Status: DC

## 2023-11-14 MED ORDER — ALBUTEROL SULFATE (2.5 MG/3ML) 0.083% IN NEBU: 2.5000 mg | INHALATION_SOLUTION | Freq: Four times a day (QID) | RESPIRATORY_TRACT | 10 refills | Status: DC | PRN

## 2023-11-14 MED ORDER — FLUTICASONE PROPIONATE 50 MCG/ACT NA SUSP: 2.0000 | Freq: Every day | NASAL | 6 refills | Status: DC

## 2023-11-14 MED ORDER — CELECOXIB 200 MG PO CAPS: 200.0000 mg | ORAL_CAPSULE | Freq: Two times a day (BID) | ORAL | 2 refills | Status: DC

## 2023-11-14 MED ORDER — AZELASTINE HCL 137 MCG/SPRAY NA SOLN: 1.0000 | Freq: Every day | NASAL | 11 refills | Status: AC

## 2023-11-14 MED ORDER — TIRZEPATIDE 10 MG/0.5ML ~~LOC~~ SOAJ: 10.0000 mg | SUBCUTANEOUS | 11 refills | Status: DC

## 2023-11-14 MED ORDER — ASPIRIN EC LOW STRENGTH 81 MG PO TBEC: 81.0000 mg | DELAYED_RELEASE_TABLET | Freq: Every day | ORAL | 3 refills | Status: AC

## 2023-11-14 MED ORDER — BISOPROLOL FUMARATE 10 MG PO TABS: 20.0000 mg | ORAL_TABLET | Freq: Every day | ORAL | 3 refills | Status: DC

## 2023-11-14 MED ORDER — ALBUTEROL SULFATE HFA 108 (90 BASE) MCG/ACT IN AERS
2.0000 | INHALATION_SPRAY | Freq: Four times a day (QID) | RESPIRATORY_TRACT | 10 refills | Status: AC | PRN
Start: 2023-11-14 — End: ?

## 2023-11-14 NOTE — Assessment & Plan Note (Signed)
I tried to get [place] but it was not affordable I tried to repeat blood work today but that you given that is not affordable for him like to see him on Jard[place] but also not affordable

## 2023-11-14 NOTE — Assessment & Plan Note (Signed)
Encouraged continuation

## 2023-11-14 NOTE — Assessment & Plan Note (Signed)
He was not able to get [place] before we will try again

## 2023-11-14 NOTE — Assessment & Plan Note (Signed)
We have reached out to him and I repeated encouragement for him to get his eyes checked we have been having vision issues recently and is overdue for eye exam

## 2023-11-14 NOTE — Assessment & Plan Note (Signed)
Lab Results  Component Value Date   PSA1 8.9 (H) 12/22/2021   PSA 11.53 (H) 05/08/2023   PSA 2.2 07/06/2014  Needs to be seen by urology  .

## 2023-11-14 NOTE — Assessment & Plan Note (Signed)
He is taking Mounjaro at 7.5 and requests increase the dose to 10 and says the pharmacy did not get the prescription last time so we sent to get

## 2023-11-14 NOTE — Progress Notes (Signed)
[facility]: [phone]   -- Medical Office Visit --  Patient:  Jason Meyer      Age: 62 y.o.       Sex:  male  Date:   11/14/2023 Today's Healthcare Provider: [name], MD  ==========================================================================    Assessment & Plan Encounter for disability examination This was a very long and complex appointment due to the need of documenting extensively not just in his medical record here but also onto handwritten forms from 2 different insurance companies for disability related insurance each of which had extensive paperwork completion requirements that had to be handwritten and would not accept just this encounter note.  In particular he needed this completed in order to even complete the application and be considered and this is urgent as he was not able to afford much of the recommended health care due to current financial stress.   Chronic diastolic heart failure (HCC) This is fluids down but it still swollen despite 80 mg Lasix 25 mg spironolactone daily for the last month and still gets very short of breath has not followed up with the heart doctor for scheduled yet says he has been too busy but there is also financial issues.  I encouraged him to try to get the just a half on the Lasix as long as he can tolerate without swelling up and avoid salt Type 2 diabetes mellitus with other specified complication, without long-term current use of insulin (HCC) He is taking Mounjaro at 7.5 and requests increase the dose to 10 and says the pharmacy did not get the prescription last time so we sent to get Shortness of breath This is a significant factor affecting his shortness of breath so encouraged him to see cardiology sooner Obesity hypoventilation syndrome (HCC) He reports he is using his CPAP regularly and I encouraged continuation Leg swelling This is due to chronic venous insufficiency and heart  failure and his weight so continued fluid pills stockings and weight loss plan with the Mounjaro Hypertension associated with diabetes (HCC) Blood pressure is looking pretty good today although I would like to see it a little lower with the diabetes will not change the medicine Morbid (severe) obesity due to excess calories (HCC) Increased Mounjaro encourage weight loss he will take 10 mg going forward Osteoarthritis of both knees, unspecified osteoarthritis type This is severe he follows with orthopedics and can barely walk and has had knee injections because with his heart he did not qualify for surgical replacement and NSAIDs carry substantial risk which she was advised of Mixed hyperlipidemia He was not able to get Repatha before we will try again DOE (dyspnea on exertion) Due to heart failure supports disability claim Coronary artery calcification Needs to see cardiologist but financially not able to On statin therapy Encouraged continuation Chronic obstructive pulmonary disease, unspecified COPD type (HCC) This is unconfirmed but he benefits from inhalers with regards to breathing and cannot afford to see pulmonology for confirmation we will refill inhalers Allergic rhinitis, unspecified seasonality, unspecified trigger He will continue with his allergy meds Nausea We will continue with as needed Zofran Gastroesophageal reflux disease without esophagitis We will continue with conservative management medications Hyperlipidemia associated with type 2 diabetes mellitus (HCC) Continue statin Vision problems We have reached out to him and I repeated encouragement for him to get his eyes checked we have been having vision issues recently and is overdue for eye exam Coronary artery disease due to calcified  coronary lesion  Stage 3a chronic kidney disease (HCC) I tried to get [place] but it was not affordable I tried to repeat blood work today but that you given that is not affordable  for him like to see him on Jardiance but also not affordable Venous stasis  Hypertension due to endocrine disorder  Morbid obesity with BMI of 50.0-59.9, adult (HCC)  Venous stasis dermatitis of both lower extremities  OSA (obstructive sleep apnea)  Erectile dysfunction, unspecified erectile dysfunction type  Low HDL (under 40)  Elevated PSA Lab Results  Component Value Date   PSA1 8.9 (H) 12/22/2021   PSA 11.53 (H) 05/08/2023   PSA 2.2 07/06/2014  Needs to be seen by urology  . Microcytosis  Low mean corpuscular volume (MCV)  Vitamin D deficiency  Material hardship due to limited financial resources      Orders Placed During this Encounter:   ED Discharge Orders          Ordered    tirzepatide (MOUNJARO) 10 MG/0.5ML Pen  Weekly       Note to Pharmacy: Replaces 7.5 mg dosing.   11/14/23 0925    albuterol (VENTOLIN HFA) 108 (90 Base) MCG/ACT inhaler  Every 6 hours PRN        11/14/23 0925    albuterol (PROVENTIL) (2.5 MG/3ML) 0.083% nebulizer solution  Every 6 hours PRN        11/14/23 0925    amLODipine (NORVASC) 5 MG tablet  Daily        11/14/23 0925    ASPIRIN EC 81 MG EC tablet  Daily        11/14/23 0925    Azelastine HCl 137 MCG/SPRAY SOLN  Daily       Note to Pharmacy: DX Code Needed  .   11/14/23 0925    bisoprolol (ZEBETA) 10 MG tablet  Daily       Note to Pharmacy: DX Code Needed  .   11/14/23 0925    buPROPion (WELLBUTRIN XL) 150 MG 24 hr tablet  Daily        11/14/23 0925    celecoxib (CELEBREX) 200 MG capsule  2 times daily        11/14/23 0925    clotrimazole (LOTRIMIN) 1 % cream  Daily        11/14/23 0925    Evolocumab (REPATHA SURECLICK) 140 MG/ML SOAJ  Every 14 days        11/14/23 0925    fluticasone (FLONASE) 50 MCG/ACT nasal spray  Daily        11/14/23 0925    fluticasone furoate-vilanterol (BREO ELLIPTA) 100-25 MCG/ACT AEPB  Daily        11/14/23 0925    furosemide (LASIX) 40 MG tablet  Daily        11/14/23 0925    metFORMIN  (GLUCOPHAGE) 1000 MG tablet  2 times daily with meals       Note to Pharmacy: DX Code Needed  .   11/14/23 0925    olmesartan (BENICAR) 40 MG tablet  Daily        11/14/23 0925    ondansetron (ZOFRAN-ODT) 4 MG disintegrating tablet  Every 8 hours PRN        11/14/23 0925    famotidine-calcium carbonate-magnesium hydroxide (PEPCID COMPLETE) 10-800-165 MG chewable tablet  2 times daily PRN        11/14/23 0925    rosuvastatin (CRESTOR) 40 MG tablet  Daily  11/14/23 0925    spironolactone (ALDACTONE) 25 MG tablet  Daily        11/14/23 0925          Diagnoses and all orders for this visit: Encounter for disability examination Chronic diastolic heart failure (HCC) -     ASPIRIN EC 81 MG EC tablet; Take 1 tablet (81 mg total) by mouth daily. -     Evolocumab (REPATHA SURECLICK) 140 MG/ML SOAJ; Inject 140 mg into the skin every 14 (fourteen) days. Type 2 diabetes mellitus with other specified complication, without long-term current use of insulin (HCC) -     tirzepatide (MOUNJARO) 10 MG/0.5ML Pen; Inject 10 mg into the skin once a week. -     Evolocumab (REPATHA SURECLICK) 140 MG/ML SOAJ; Inject 140 mg into the skin every 14 (fourteen) days. -     metFORMIN (GLUCOPHAGE) 1000 MG tablet; Take 1 tablet (1,000 mg total) by mouth 2 (two) times daily with a meal. -     spironolactone (ALDACTONE) 25 MG tablet; Take 1 tablet (25 mg total) by mouth daily. Shortness of breath -     albuterol (VENTOLIN HFA) 108 (90 Base) MCG/ACT inhaler; Inhale 2 puffs into the lungs every 6 (six) hours as needed for wheezing or shortness of breath. -     albuterol (PROVENTIL) (2.5 MG/3ML) 0.083% nebulizer solution; Take 3 mLs (2.5 mg total) by nebulization every 6 (six) hours as needed for wheezing or shortness of breath. Obesity hypoventilation syndrome (HCC) -     albuterol (VENTOLIN HFA) 108 (90 Base) MCG/ACT inhaler; Inhale 2 puffs into the lungs every 6 (six) hours as needed for wheezing or shortness of  breath. -     albuterol (PROVENTIL) (2.5 MG/3ML) 0.083% nebulizer solution; Take 3 mLs (2.5 mg total) by nebulization every 6 (six) hours as needed for wheezing or shortness of breath. Leg swelling -     amLODipine (NORVASC) 5 MG tablet; Take 1 tablet (5 mg total) by mouth daily. Reduced dose due to leg swelling. -     furosemide (LASIX) 40 MG tablet; Take 2 tablets (80 mg total) by mouth daily. Must take with spironolactone to keep potassium up Reduce dose to just 40 mg daily if no fluid is accumulating. Hypertension associated with diabetes (HCC) -     bisoprolol (ZEBETA) 10 MG tablet; Take 2 tablets (20 mg total) by mouth daily. -     olmesartan (BENICAR) 40 MG tablet; Take 1 tablet (40 mg total) by mouth daily. Morbid (severe) obesity due to excess calories (HCC) -     buPROPion (WELLBUTRIN XL) 150 MG 24 hr tablet; Take 1 tablet (150 mg total) by mouth daily. Osteoarthritis of both knees, unspecified osteoarthritis type -     celecoxib (CELEBREX) 200 MG capsule; Take 1 capsule (200 mg total) by mouth 2 (two) times daily. Only take if pain severe. Risk of heart issues. Essential hypertension -     Evolocumab (REPATHA SURECLICK) 140 MG/ML SOAJ; Inject 140 mg into the skin every 14 (fourteen) days. Mixed hyperlipidemia -     Evolocumab (REPATHA SURECLICK) 140 MG/ML SOAJ; Inject 140 mg into the skin every 14 (fourteen) days. DOE (dyspnea on exertion) -     Evolocumab (REPATHA SURECLICK) 140 MG/ML SOAJ; Inject 140 mg into the skin every 14 (fourteen) days. Coronary artery calcification -     Evolocumab (REPATHA SURECLICK) 140 MG/ML SOAJ; Inject 140 mg into the skin every 14 (fourteen) days. On statin therapy -  Evolocumab (REPATHA SURECLICK) 140 MG/ML SOAJ; Inject 140 mg into the skin every 14 (fourteen) days. Chronic obstructive pulmonary disease, unspecified COPD type (HCC) -     fluticasone (FLONASE) 50 MCG/ACT nasal spray; Place 2 sprays into both nostrils daily. -     fluticasone  furoate-vilanterol (BREO ELLIPTA) 100-25 MCG/ACT AEPB; Inhale 1 puff into the lungs daily. Allergic rhinitis, unspecified seasonality, unspecified trigger -     Azelastine HCl 137 MCG/SPRAY SOLN; Place 1 spray into the nose daily. Nausea -     ondansetron (ZOFRAN-ODT) 4 MG disintegrating tablet; Take 1 tablet (4 mg total) by mouth every 8 (eight) hours as needed for nausea or vomiting. Gastroesophageal reflux disease without esophagitis -     famotidine-calcium carbonate-magnesium hydroxide (PEPCID COMPLETE) 10-800-165 MG chewable tablet; Chew 1 tablet by mouth 2 (two) times daily as needed. Hyperlipidemia associated with type 2 diabetes mellitus (HCC) -     rosuvastatin (CRESTOR) 40 MG tablet; Take 1 tablet (40 mg total) by mouth daily. Replaces atorvastatin which failed to reach Low Density Lipoprotein (LDL cholesterol) goal of 55, take only if repatha not approved/available. Vision problems Coronary artery disease due to calcified coronary lesion Stage 3a chronic kidney disease (HCC) Venous stasis Hypertension due to endocrine disorder Morbid obesity with BMI of 50.0-59.9, adult (HCC) Venous stasis dermatitis of both lower extremities OSA (obstructive sleep apnea) Erectile dysfunction, unspecified erectile dysfunction type Low HDL (under 40) Elevated PSA Microcytosis Low mean corpuscular volume (MCV) Vitamin D deficiency Other orders -     clotrimazole (LOTRIMIN) 1 % cream; Apply topically daily.  Recommended follow-up: No follow-ups on file. Future Appointments  Date Time Provider Department Center  12/18/2023  8:00 AM [name], MD [facility]  Patient Care Team: [name], MD as PCP - General (Internal Medicine) Jonelle Sidle, MD as PCP - Cardiology (Cardiology) Vickki Hearing, MD as Consulting Physician (Orthopedic Surgery)    SUBJECTIVE: 62 y.o. male who has Type 2 diabetes mellitus with other specified complication (HCC); Hypertension; Morbid  obesity with BMI of 50.0-59.9, adult (HCC); Mixed hyperlipidemia; Venous stasis; OA (osteoarthritis); DOE (dyspnea on exertion); Chronic diastolic heart failure (HCC); Venous stasis dermatitis of both lower extremities; OSA (obstructive sleep apnea); Erectile dysfunction; Low HDL (under 40); Elevated PSA; Coronary artery calcification; Coronary artery disease; Microcytosis; Low mean corpuscular volume (MCV); On statin therapy; Chronic kidney disease, stage 3 unspecified (HCC); Vitamin D deficiency; Vision problems; and Encounter for disability examination on their problem list.  Main reasons for visit/main concerns/chief complaint: 1 month follow-up    AI-Extracted: Discussed the use of AI scribe software for clinical note transcription with the patient, who gave verbal consent to proceed.  History of Present Illness           Note that patient  has a past medical history of Allergy, Arthritis, CAP (community acquired pneumonia) (04/12/2023), Chronic diastolic heart failure (HCC) (78/29/5621), Coronary artery calcification (10/19/2022), Hyperlipidemia, Hypertension, OSA (obstructive sleep apnea) (10/19/2022), Sepsis (HCC) (04/12/2023), Sleep apnea, and Type 2 diabetes mellitus (HCC).  Problem list overviews that were updated at today's visit: Problem  Vision Problems  Encounter for Disability Examination  Cap (Community Acquired Pneumonia) (Resolved)    Med reconciliation: Current Outpatient Medications on File Prior to Visit  Medication Sig   acetaminophen (TYLENOL) 325 MG tablet Take 2 tablets (650 mg total) by mouth every 6 (six) hours as needed for mild pain (or Fever >/= 101).   Vitamin D, Ergocalciferol, (DRISDOL) 1.25 MG (50000 UNIT) CAPS  capsule Take 1 capsule (50,000 Units total) by mouth every 7 (seven) days. Once a WEEK ONLY   No current facility-administered medications on file prior to visit.   Medications Discontinued During This Encounter  Medication Reason   tirzepatide  (MOUNJARO) 10 MG/0.5ML Pen    clotrimazole-betamethasone (LOTRISONE) cream Completed Course   pantoprazole (PROTONIX) 40 MG tablet Completed Course   potassium chloride (MICRO-K) 10 MEQ CR capsule Completed Course   sildenafil (REVATIO) 20 MG tablet Completed Course   fluticasone (FLONASE) 50 MCG/ACT nasal spray Reorder   rosuvastatin (CRESTOR) 40 MG tablet Reorder   olmesartan (BENICAR) 40 MG tablet Reorder   metFORMIN (GLUCOPHAGE) 1000 MG tablet Reorder   albuterol (VENTOLIN HFA) 108 (90 Base) MCG/ACT inhaler Reorder   albuterol (PROVENTIL) (2.5 MG/3ML) 0.083% nebulizer solution Reorder   bisoprolol (ZEBETA) 10 MG tablet Reorder   fluticasone furoate-vilanterol (BREO ELLIPTA) 100-25 MCG/ACT AEPB Reorder   ondansetron (ZOFRAN-ODT) 4 MG disintegrating tablet Reorder   Evolocumab (REPATHA SURECLICK) 140 MG/ML SOAJ Reorder   celecoxib (CELEBREX) 200 MG capsule Reorder   buPROPion (WELLBUTRIN XL) 150 MG 24 hr tablet Reorder   Azelastine HCl 137 MCG/SPRAY SOLN Reorder   clotrimazole (LOTRIMIN) 1 % cream Reorder   amLODipine (NORVASC) 5 MG tablet Reorder   furosemide (LASIX) 40 MG tablet Reorder   spironolactone (ALDACTONE) 25 MG tablet Reorder   ASPIRIN EC 81 MG EC tablet Reorder     Objective   Physical Exam     11/14/2023    8:49 AM 10/05/2023    1:49 PM 10/05/2023    1:41 PM  Vitals with BMI  Height 5\' 11"   5\' 11"   Weight 370 lbs  371 lbs 10 oz  BMI 51.63  51.85  Systolic 134 146 846  Diastolic 73 82 80  Pulse 82  70   Wt Readings from Last 10 Encounters:  11/14/23 (!) 370 lb (167.8 kg)  10/05/23 (!) 371 lb 9.6 oz (168.6 kg)  09/07/23 (!) 384 lb 3.2 oz (174.3 kg)  05/08/23 (!) 375 lb 6.4 oz (170.3 kg)  04/25/23 (!) 364 lb (165.1 kg)  04/13/23 (!) 379 lb 3.1 oz (172 kg)  02/06/23 (!) 380 lb 9.6 oz (172.6 kg)  12/26/22 (!) 376 lb 3.2 oz (170.6 kg)  10/19/22 (!) 390 lb (176.9 kg)  09/14/22 (!) 389 lb 6.4 oz (176.6 kg)   Vital signs reviewed.  Nursing notes reviewed.  Weight trend reviewed. Abnormalities and Problem-Specific physical exam findings:  morbid obesity, difficulty standing, obvious pain with standing and walking, increase WOB with minimal exertion, marked leg swelling persist.  General Appearance:  No acute distress appreciable.   Well-groomed, healthy-appearing male.  Well proportioned with no abnormal fat distribution.  Good muscle tone. Pulmonary:  Normal work of breathing at rest, no respiratory distress apparent. SpO2: 95 %  Musculoskeletal: All extremities are intact.  Neurological:  Awake, alert, oriented, and engaged.  No obvious focal neurological deficits or cognitive impairments.  Sensorium seems unclouded.   Speech is clear and coherent with logical content. Psychiatric:  Appropriate mood, pleasant and cooperative demeanor, thoughtful and engaged during the exam  Results            No results found for any visits on 11/14/23.  Office Visit on 10/05/2023  Component Date Value   Vit D, 25-Hydroxy 10/05/2023 10 (L)    Uric Acid, Serum 10/05/2023 5.4    PTH 10/05/2023 13 (L)    Phosphorus 10/05/2023 3.2    Magnesium 10/05/2023 1.9  Glucose, Bld 10/05/2023 85    BUN 10/05/2023 14    Creat 10/05/2023 1.22    BUN/Creatinine Ratio 10/05/2023 SEE NOTE:    Sodium 10/05/2023 141    Potassium 10/05/2023 4.6    Chloride 10/05/2023 102    CO2 10/05/2023 28    Calcium 10/05/2023 9.8   Office Visit on 09/07/2023  Component Date Value   WBC 09/07/2023 9.5    RBC 09/07/2023 6.28 (H)    Hemoglobin 09/07/2023 15.6    HCT 09/07/2023 49.5    MCV 09/07/2023 78.8 (L)    MCH 09/07/2023 24.8 (L)    MCHC 09/07/2023 31.5 (L)    RDW 09/07/2023 14.8    Platelets 09/07/2023 270    MPV 09/07/2023 10.8    Neutro Abs 09/07/2023 6,603    Lymphs Abs 09/07/2023 1,957    Absolute Monocytes 09/07/2023 751    Eosinophils Absolute 09/07/2023 133    Basophils Absolute 09/07/2023 57    Neutrophils Relative % 09/07/2023 69.5    Total Lymphocyte  09/07/2023 20.6    Monocytes Relative 09/07/2023 7.9    Eosinophils Relative 09/07/2023 1.4    Basophils Relative 09/07/2023 0.6    Glucose, Bld 09/07/2023 78    BUN 09/07/2023 14    Creat 09/07/2023 1.24    BUN/Creatinine Ratio 09/07/2023 SEE NOTE:    Sodium 09/07/2023 140    Potassium 09/07/2023 4.0    Chloride 09/07/2023 106    CO2 09/07/2023 22    Calcium 09/07/2023 9.2    Total Protein 09/07/2023 6.7    Albumin 09/07/2023 4.1    Globulin 09/07/2023 2.6    AG Ratio 09/07/2023 1.6    Total Bilirubin 09/07/2023 0.4    Alkaline phosphatase (AP* 09/07/2023 75    AST 09/07/2023 21    ALT 09/07/2023 24    Cholesterol 09/07/2023 113    HDL 09/07/2023 37 (L)    Triglycerides 09/07/2023 73    LDL Cholesterol (Calc) 09/07/2023 61    Total CHOL/HDL Ratio 09/07/2023 3.1    Non-HDL Cholesterol (Cal* 09/07/2023 76    Hgb A1c MFr Bld 09/07/2023 6.7 (H)    Mean Plasma Glucose 09/07/2023 146    eAG (mmol/L) 09/07/2023 8.1    Creatinine, Urine 09/07/2023 218    Microalb, Ur 09/07/2023 8.5    Microalb Creat Ratio 09/07/2023 39 (H)   Office Visit on 05/08/2023  Component Date Value   PSA 05/08/2023 11.53 (H)    Sodium 05/08/2023 139    Potassium 05/08/2023 3.9    Chloride 05/08/2023 105    CO2 05/08/2023 24    Glucose, Bld 05/08/2023 140 (H)    BUN 05/08/2023 13    Creatinine, Ser 05/08/2023 1.12    GFR 05/08/2023 71.43    Calcium 05/08/2023 9.0    WBC 05/08/2023 7.9    RBC 05/08/2023 5.90 (H)    Hemoglobin 05/08/2023 14.5    HCT 05/08/2023 45.5    MCV 05/08/2023 77.1 (L)    MCHC 05/08/2023 31.8    RDW 05/08/2023 18.3 (H)    Platelets 05/08/2023 196.0    Neutrophils Relative % 05/08/2023 73.8    Lymphocytes Relative 05/08/2023 18.4    Monocytes Relative 05/08/2023 5.3    Eosinophils Relative 05/08/2023 2.0    Basophils Relative 05/08/2023 0.5    Neutro Abs 05/08/2023 5.8    Lymphs Abs 05/08/2023 1.4    Monocytes Absolute 05/08/2023 0.4    Eosinophils Absolute 05/08/2023  0.2    Basophils Absolute 05/08/2023 0.0    Ferritin  05/08/2023 38.7   No results displayed because visit has over 200 results.    Office Visit on 12/26/2022  Component Date Value   WBC 12/26/2022 10.1    RBC 12/26/2022 5.71    Platelets 12/26/2022 331.0    Hemoglobin 12/26/2022 14.3    HCT 12/26/2022 44.1    MCV 12/26/2022 77.2 (L)    MCHC 12/26/2022 32.5    RDW 12/26/2022 17.2 (H)    Sodium 12/26/2022 141    Potassium 12/26/2022 4.3    Chloride 12/26/2022 103    CO2 12/26/2022 29    Glucose, Bld 12/26/2022 103 (H)    BUN 12/26/2022 11    Creatinine, Ser 12/26/2022 1.17    Total Bilirubin 12/26/2022 0.5    Alkaline Phosphatase 12/26/2022 95    AST 12/26/2022 13    ALT 12/26/2022 12    Total Protein 12/26/2022 7.2    Albumin 12/26/2022 4.0    GFR 12/26/2022 67.96    Calcium 12/26/2022 9.5    Cholesterol 12/26/2022 139    Triglycerides 12/26/2022 74.0    HDL 12/26/2022 30.50 (L)    VLDL 12/26/2022 14.8    LDL Cholesterol 12/26/2022 94    Total CHOL/HDL Ratio 12/26/2022 5    NonHDL 12/26/2022 108.76    Microalb, Ur 12/26/2022 1.7    Creatinine,U 12/26/2022 61.7    Microalb Creat Ratio 12/26/2022 2.7    MICRO NUMBER: 12/26/2022 [id number]    SPECIMEN QUALITY: 12/26/2022 Adequate    Sample Source 12/26/2022 URINE    STATUS: 12/26/2022 FINAL    ISOLATE 1: 12/26/2022 Klebsiella pneumoniae (A)    Color, UA 12/26/2022 yellow    Clarity, UA 12/26/2022 cloudy    Glucose, UA 12/26/2022 Negative    Bilirubin, UA 12/26/2022 Negative    Ketones, UA 12/26/2022 Negative    Spec Grav, UA 12/26/2022 1.015    Blood, UA 12/26/2022 Negative    pH, UA 12/26/2022 5.0    Protein, UA 12/26/2022 Negative    Urobilinogen, UA 12/26/2022 0.2    Nitrite, UA 12/26/2022 Postive    Leukocytes, UA 12/26/2022 Moderate (2+) (A)    No image results found.   No results found.  CT Angio Chest PE W and/or Wo Contrast  Result Date: 04/12/2023 CLINICAL DATA:  Cough fever EXAM: CT ANGIOGRAPHY CHEST  WITH CONTRAST TECHNIQUE: Multidetector CT imaging of the chest was performed using the standard protocol during bolus administration of intravenous contrast. Multiplanar CT image reconstructions and MIPs were obtained to evaluate the vascular anatomy. RADIATION DOSE REDUCTION: This exam was performed according to the departmental dose-optimization program which includes automated exposure control, adjustment of the mA and/or kV according to patient size and/or use of iterative reconstruction technique. CONTRAST:  OMNIPAQUE IOHEXOL 350 MG/ML SOLN COMPARISON:  Chest xray 04/12/2023, CT chest 07/30/2022 FINDINGS: Cardiovascular: Satisfactory opacification of the pulmonary arteries to the segmental level. No evidence of pulmonary embolism. Normal heart size. No pericardial effusion.Nonaneurysmal aorta. Mediastinum/Nodes: Stable to slight increased mediastinal adenopathy for example subcarinal node measures 2.2 cm compared with 1.8 cm previously. Thyroid gland, trachea, and esophagus demonstrate no significant findings. Lungs/Pleura: Heterogenous consolidations in the left greater than right lower lobes. No pleural effusion. No pneumothorax Upper Abdomen: No acute abnormality. Musculoskeletal: No chest wall abnormality. No acute or significant osseous findings. Review of the MIP images confirms the above findings. IMPRESSION: 1. Negative for acute pulmonary embolus. 2. Heterogenous consolidations in the left greater than right lower lobes consistent with pneumonia. 3. Stable to slight increased mediastinal adenopathy, likely  reactive. Electronically Signed   By: Jasmine Pang M.D.   On: 04/12/2023 02:54   DG Chest Port 1 View  Result Date: 04/12/2023 CLINICAL DATA:  Cough, chills, fever, congestion. EXAM: PORTABLE CHEST 1 VIEW COMPARISON:  Chest radiographs and CT 07/30/2022 FINDINGS: Stable cardiomediastinal silhouette. Bibasilar atelectasis or infiltrates. No pleural effusion or pneumothorax. No displaced rib  fractures. IMPRESSION: Bibasilar atelectasis or infiltrates. Electronically Signed   By: Minerva Fester M.D.   On: 04/12/2023 00:51      Attestation:  I have personally spent  54 minutes involved in face-to-face and non-face-to-face activities for this patient on the day of the visit. Professional time spent includes the following activities:  Preparing to see the patient by reviewing medical records prior to and during the encounter; Obtaining, documenting, and reviewing an updated medical history; Performing a medically appropriate examination;  Evaluating, synthesizing, and documenting the available clinical information in the EMR;  Coordinating/Communicating with other health care professionals-most notably I had to document extensively his medical conditions today not just in the chart.  Also with multiple long format and written forms required by multiple insurance companies for his disability policies; Independently interpreting results (not separately reported), Communicating, counseling, educating about results to the patient/; Collaboratively developing and communicating an individualized treatment plan with the patient; Placing medically necessary orders (for medications/tests/procedures/referrals);   This time was independent of any separately billable procedure(s).  The extended duration of this patient visit was medically necessary due to several factors:  The patient's health condition is multifaceted, requiring a comprehensive evaluation of patient and their past records to ensure accurate diagnosis and treatment planning; Effective patient education and communication, particularly for patients with complex care needs (such as extensive insurance paperwork(see scanned)), often require additional time to ensure the patient (or caregivers) fully understand the care plan;  Coordination of care with other healthcare professionals and services depends on thorough documentation, extending both  documentation time and visit durations.  All these factors are integral to providing high-quality patient care and ensuring optimal health outcomes.    Additional Info: This encounter employed real-time, collaborative documentation. The patient actively reviewed and updated their medical record on a shared screen, ensuring transparency and facilitating joint problem-solving for the problem list, overview, and plan. This approach promotes accurate, informed care. The treatment plan was discussed and reviewed in detail, including medication safety, potential side effects, and all patient questions. We confirmed understanding and comfort with the plan. Follow-up instructions were established, including contacting the office for any concerns, returning if symptoms worsen, persist, or new symptoms develop, and precautions for potential emergency department visits.

## 2023-11-14 NOTE — Assessment & Plan Note (Signed)
Needs to see cardiologist but financially not able to

## 2023-11-14 NOTE — Assessment & Plan Note (Signed)
This was a very long and complex appointment due to the need of documenting extensively not just in his medical record here but also onto handwritten forms from 2 different insurance companies for disability related insurance each of which had extensive paperwork completion requirements that had to be handwritten and would not accept just this encounter note.  In particular he needed this completed in order to even complete the application and be considered and this is urgent as he was not able to afford much of the recommended health care due to current financial stress.

## 2023-11-14 NOTE — Assessment & Plan Note (Signed)
Due to heart failure supports disability claim

## 2023-11-14 NOTE — Assessment & Plan Note (Signed)
This is fluids down but it still swollen despite 80 mg Lasix 25 mg spironolactone daily for the last month and still gets very short of breath has not followed up with the heart doctor for scheduled yet says he has been too busy but there is also financial issues.  I encouraged him to try to get the just a half on the Lasix as long as he can tolerate without swelling up and avoid salt

## 2023-11-14 NOTE — Assessment & Plan Note (Signed)
This is severe he follows with orthopedics and can barely walk and has had knee injections because with his heart he did not qualify for surgical replacement and NSAIDs carry substantial risk which she was advised of

## 2023-11-27 ENCOUNTER — Telehealth: Payer: Self-pay | Admitting: Internal Medicine

## 2023-11-27 NOTE — Telephone Encounter (Signed)
Pt would like a call back concerning his disability paperwork that he dropped off at lat appt, early this month. Please advise.

## 2023-11-29 NOTE — Telephone Encounter (Signed)
Copied from CRM [id number]. Topic: General - Other >> Nov 28, 2023  4:17 PM Corin V wrote: Reason for CRM: Patient called regarding his disability paperwork. The insurance companies are now calling him regarding getting these papers returned and they are holding payments from him until paperwork is turned in. Please call patient back regarding timeframe he can get these back by.  Spoke with patient earlier today and he stated that there are two forms he gave to Dr. Jon Billings for his disability to be completed. Dr. Jon Billings does not recall being given these forms. I have to locate the forms in the office and will check with the front desk tomorrow to see if they have them because I do not have them.

## 2023-11-30 NOTE — Telephone Encounter (Signed)
Pt brought in Disability Paperwork. Needs to be turned in by 12/12/23.

## 2023-11-30 NOTE — Telephone Encounter (Signed)
Jason Meyer personally gave the disability paperwork to me.

## 2023-11-30 NOTE — Telephone Encounter (Signed)
Copied from CRM [id number]. Topic: Clinical - Prescription Issue >> Nov 30, 2023  1:55 PM Melissa C wrote: Reason for CRM: Received call asking if prior authorization fax was received regarding patient's Evolocumab (REPATHA SURECLICK) 140 MG/ML SOAJ. Looked in patient's chart and didn't see evidence of such fax. Person on the line stated it should have been faxed over on 12/17. They stated that if it was not received or there were any questions, office could call [phone] and reference case number [id number]  Called this number and spoke with them concerning this. I was told that the prior authorization was sent to cover my meds on 12/10 and a paper form was faxed on 12/17 (which I did not receive). I sent a message to our prior authorization team to start the prior authorization for Repatha for this patient.

## 2023-12-03 ENCOUNTER — Telehealth: Payer: Self-pay | Admitting: Pharmacy Technician

## 2023-12-03 ENCOUNTER — Other Ambulatory Visit (HOSPITAL_COMMUNITY): Payer: Self-pay

## 2023-12-03 NOTE — Telephone Encounter (Signed)
Sent my chart message informing patient of this information.

## 2023-12-03 NOTE — Telephone Encounter (Signed)
-----   Message from CMA Tiffany L sent at 12/03/2023 10:37 AM EST ----- Regarding: PA for Repatha Please start PA for Repatha injection for patient. I think I sent a request on 12/20 (Friday) already, not sure. Thank you.

## 2023-12-03 NOTE — Telephone Encounter (Signed)
Pharmacy Patient Advocate Encounter  Received notification from [facility] that Prior Authorization for repatha has been APPROVED from 12/03/23 to 12/02/24. Ran test claim, Copay is $25.00 one month. This test claim was processed through [facility]- copay amounts may vary at other pharmacies due to pharmacy/plan contracts, or as the patient moves through the different stages of their insurance plan.   PA #/Case ID/Reference #: [id number]

## 2023-12-03 NOTE — Telephone Encounter (Signed)
Pharmacy Patient Advocate Encounter   Received notification from Physician's Office that prior authorization for repatha is required/requested.   Insurance verification completed.   The patient is insured through [facility] .   Per test claim: PA required; PA submitted to above mentioned insurance via CoverMyMeds Key/confirmation #/EOC [id number] Status is pending

## 2023-12-04 NOTE — Telephone Encounter (Signed)
Sent my chart message on 12/23 advising patient that: Repatha has been APPROVED from 12/03/23 to 12/02/24. Called patient and informed him. Stated that he does not know how to use my chart.

## 2023-12-11 ENCOUNTER — Telehealth: Payer: Self-pay | Admitting: Internal Medicine

## 2023-12-11 NOTE — Telephone Encounter (Signed)
Copied from CRM [id number]. Topic: General - Other >> Dec 11, 2023  1:53 PM [place] wrote: Reason for CRM: Patient was returning a call for Tiffany about disability papers. He was not by his phone when he received the call.

## 2023-12-11 NOTE — Telephone Encounter (Signed)
Called patient back concerning this. Just wanted to inform him that all forms were faxed yesterday for his disability.

## 2023-12-18 ENCOUNTER — Ambulatory Visit: Payer: 59 | Admitting: Internal Medicine

## 2023-12-24 ENCOUNTER — Telehealth: Payer: Self-pay | Admitting: Internal Medicine

## 2023-12-24 NOTE — Telephone Encounter (Signed)
 NO records to release. Patient's last ov at [facility] 07/14/2022.  Copied from CRM [id number]. Topic: Medical Record Request - Other >> Dec 21, 2023  5:16 PM Delon DASEN wrote: Reason for CRM: Daine with [employer] phone [phone] - need records from visits with Dr. Maryanne 10/24/22 through 09/24/2023 for disability claim

## 2023-12-26 ENCOUNTER — Other Ambulatory Visit: Payer: Self-pay | Admitting: Internal Medicine

## 2023-12-26 DIAGNOSIS — I872 Venous insufficiency (chronic) (peripheral): Secondary | ICD-10-CM

## 2024-01-14 ENCOUNTER — Ambulatory Visit: Payer: 59 | Admitting: Internal Medicine

## 2024-01-14 ENCOUNTER — Encounter: Payer: Self-pay | Admitting: Internal Medicine

## 2024-01-14 VITALS — BP 136/86 | HR 74 | Temp 97.8°F | Ht 71.0 in | Wt 369.4 lb

## 2024-01-14 DIAGNOSIS — J320 Chronic maxillary sinusitis: Secondary | ICD-10-CM

## 2024-01-14 DIAGNOSIS — R972 Elevated prostate specific antigen [PSA]: Secondary | ICD-10-CM

## 2024-01-14 DIAGNOSIS — N1831 Chronic kidney disease, stage 3a: Secondary | ICD-10-CM

## 2024-01-14 DIAGNOSIS — E559 Vitamin D deficiency, unspecified: Secondary | ICD-10-CM

## 2024-01-14 DIAGNOSIS — E1121 Type 2 diabetes mellitus with diabetic nephropathy: Secondary | ICD-10-CM

## 2024-01-14 DIAGNOSIS — R718 Other abnormality of red blood cells: Secondary | ICD-10-CM | POA: Diagnosis not present

## 2024-01-14 DIAGNOSIS — E569 Vitamin deficiency, unspecified: Secondary | ICD-10-CM | POA: Diagnosis not present

## 2024-01-14 DIAGNOSIS — Z6841 Body Mass Index (BMI) 40.0 and over, adult: Secondary | ICD-10-CM

## 2024-01-14 DIAGNOSIS — I5032 Chronic diastolic (congestive) heart failure: Secondary | ICD-10-CM

## 2024-01-14 DIAGNOSIS — Z7985 Long-term (current) use of injectable non-insulin antidiabetic drugs: Secondary | ICD-10-CM

## 2024-01-14 DIAGNOSIS — E1169 Type 2 diabetes mellitus with other specified complication: Secondary | ICD-10-CM

## 2024-01-14 MED ORDER — TIRZEPATIDE 12.5 MG/0.5ML ~~LOC~~ SOAJ: 12.5000 mg | SUBCUTANEOUS | 0 refills | Status: DC

## 2024-01-14 MED ORDER — FLUTICASONE PROPIONATE 50 MCG/ACT NA SUSP: 2.0000 | Freq: Every day | NASAL | 6 refills | Status: AC

## 2024-01-14 MED ORDER — EMPAGLIFLOZIN 10 MG PO TABS: 10.0000 mg | ORAL_TABLET | Freq: Every day | ORAL | 3 refills | Status: DC

## 2024-01-14 MED ORDER — KERENDIA 20 MG PO TABS: 20.0000 mg | ORAL_TABLET | Freq: Every day | ORAL | 1 refills | Status: DC

## 2024-01-14 NOTE — Assessment & Plan Note (Signed)
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 Explained genetic implications and importance of testing for family planning. Order blood work to test for thalassemia.

## 2024-01-14 NOTE — Assessment & Plan Note (Signed)
Diabetes Mellitus On Mounjaro for diabetes management. Blood sugar control might be improving. Due for diabetes lab work. Order diabetes lab work including A1c.

## 2024-01-14 NOTE — Assessment & Plan Note (Signed)
Chronic Kidney Disease On Lasix. Discussed potential renal strain from Celebrex and importance of fluid intake. Considered Jardiance for renal and cardiac protection. Discussed insurance barriers to [place] and potential benefits over spironolactone. Limit Celebrex use and encourage adequate fluid intake when taking Celebrex. Consider Jardiance at a low dose. Send in [place] with a special code for insurance coverage.

## 2024-01-14 NOTE — Assessment & Plan Note (Signed)
Prostate Health High PSA in May of last year, not seen a urologist. Emphasized early detection and treatment to extend life expectancy. Recheck PSA and refer to urology for further evaluation and possible biopsy.

## 2024-01-14 NOTE — Patient Instructions (Signed)
VISIT SUMMARY:  During today's visit, we discussed your progress with weight management and heart failure, along with other health concerns. You have successfully lost weight and maintained it despite the holiday season. Your heart failure symptoms have not worsened, and you have not been hospitalized since May. We also addressed your nasal congestion, diabetes, hypertension, chronic kidney disease, vitamin D deficiency, possible thalassemia, and prostate health. We reviewed your medications and made some adjustments to better manage your conditions.  YOUR PLAN:  -OBESITY: Obesity means having an excessive amount of body fat. You have lost weight and are currently at 369 lbs. Continue taking Mounjaro at the increased dose of 10 mg, follow a low-carb diet, and engage in regular physical activity like walking. We will follow up in three months.  -HEART FAILURE: Heart failure means your heart is not pumping blood as well as it should. You have not had any hospitalizations or worsening symptoms since May. Continue with your current heart failure management and monitor for any new symptoms.  -DIABETES MELLITUS: Diabetes mellitus is a condition where your blood sugar levels are too high. You are on [facility] for diabetes management, and we will order lab work, including A1c, to check your blood sugar control.  -HYPERTENSION: Hypertension means high blood pressure. Your blood pressure today was 136/86, which is higher than the target of 130/80. Continue taking Benicar and Lasix, and focus on weight loss and reducing salt intake. Monitor your blood pressure at home.  -CHRONIC KIDNEY DISEASE: Chronic kidney disease means your kidneys are not working as well as they should. Continue taking Lasix and limit the use of Celebrex. Ensure adequate fluid intake and consider starting Jardiance at a low dose. We will also send in [place] with a special code for insurance coverage.  -VITAMIN D DEFICIENCY: Vitamin D  deficiency means you have low levels of vitamin D. Continue taking your vitamin D supplement, and we will recheck your levels.  -POSSIBLE THALASSEMIA: Thalassemia is a genetic blood disorder that affects the production of hemoglobin. Your low MCV suggests possible thalassemia. We will order blood work to test for this condition.  -PROSTATE HEALTH: High PSA levels can indicate prostate issues. You had a high PSA in May of last year. We will recheck your PSA levels and refer you to a urologist for further evaluation and possible biopsy.  -NASAL CONGESTION: Nasal congestion means your nasal passages are blocked, making it hard to breathe through your nose. We will prescribe Flonase and recommend using a saline spray before Flonase for better results.  -GENERAL HEALTH MAINTENANCE: You are overdue for an eye exam and need routine lab work every three months until stable. Schedule a diabetic eye exam and follow up with routine lab work. We will see you again in three months.  INSTRUCTIONS:  Please schedule a follow-up appointment in three months. Make sure to get your diabetes lab work, including A1c, and recheck your vitamin D and PSA levels. Also, schedule a diabetic eye exam and routine lab work every three months until stable. Monitor your blood pressure at home and keep track of any new symptoms related to heart failure or other conditions.  It was a pleasure seeing you today! Your health and satisfaction are our top priorities.  Glenetta Hew, MD  Your Providers PCP: [name], MD,  [phone]) Referring Provider: [name], MD,  [phone]) Care Team Provider: Jonelle Sidle, MD,  [phone]) Care Team Provider: Vickki Hearing, MD,  [phone])  NEXT STEPS: [x]  Early Intervention: Schedule sooner appointment, call our on-call services, or go to emergency room if there is any significant Increase in pain or discomfort New or worsening symptoms Sudden  or severe changes in your health [x]  Flexible Follow-Up: We recommend a No follow-ups on file. for optimal routine care. This allows for progress monitoring and treatment adjustments. [x]  Preventive Care: Schedule your annual preventive care visit! It's typically covered by insurance and helps identify potential health issues early. [x]  Lab & X-ray Appointments: Incomplete tests scheduled today, or call to schedule. X-rays: [facility] [place] (M-F, 8:30am-noon or 1pm-5pm). [x]  Medical Information Release: Sign a release form at front desk to obtain relevant medical information we don't have.  MAKING THE MOST OF OUR FOCUSED 20 MINUTE APPOINTMENTS: [x]   Clearly state your top concerns at the beginning of the visit to focus our discussion [x]   If you anticipate you will need more time, please inform the front desk during scheduling - we can book multiple appointments in the same week. [x]   If you have transportation problems- use our convenient video appointments or ask about transportation support. [x]   We can get down to business faster if you use MyChart to update information before the visit and submit non-urgent questions before your visit. Thank you for taking the time to provide details through MyChart.  Let our nurse know and she can import this information into your encounter documents.  Arrival and Wait Times: [x]   Arriving on time ensures that everyone receives prompt attention. [x]   Early morning (8a) and afternoon (1p) appointments tend to have shortest wait times. [x]   Unfortunately, we cannot delay appointments for late arrivals or hold slots during phone calls.  Getting Answers and Following Up [x]   Simple Questions & Concerns: For quick questions or basic follow-up after your visit, reach [place] at [phone] or MyChart messaging. [x]   Complex Concerns: If your concern is more complex, scheduling an appointment might be best. Discuss this with the staff to find the most  suitable option. [x]   Lab & Imaging Results: We'll contact you directly if results are abnormal or you don't use MyChart. Most normal results will be on MyChart within 2-3 business days, with a review message from Dr. Jon Billings. Haven't heard back in 2 weeks? Need results sooner? Contact us at [phone]. [x]   Referrals: Our referral coordinator will manage specialist referrals. The specialist's office should contact you within 2 weeks to schedule an appointment. Call us if you haven't heard from them after 2 weeks.  Staying Connected [x]   MyChart: Activate your MyChart for the fastest way to access results and message [place]. See the last page of this paperwork for instructions on how to activate.  Bring to Your Next Appointment [x]   Medications: Please bring all your medication bottles to your next appointment to ensure we have an accurate record of your prescriptions. [x]   Health Diaries: If you're monitoring any health conditions at home, keeping a diary of your readings can be very helpful for discussions at your next appointment.  Billing [x]   X-ray & Lab Orders: These are billed by separate companies. Contact the invoicing company directly for questions or concerns. [x]   Visit Charges: Discuss any billing inquiries with our administrative services team.  Your Satisfaction Matters [x]   Share Your Experience: We strive for your satisfaction! If you have any complaints, or preferably compliments, please let Dr. Jon Billings know directly or contact our Practice Administrators, Edwena Felty or [employer], by  asking at the front desk.   Reviewing Your Records [x]   Review this early draft of your clinical encounter notes below and the final encounter summary tomorrow on MyChart after its been completed.  All orders placed so far are visible here: Elevated PSA -     VITAMIN D 25 Hydroxy (Vit-D Deficiency, Fractures) -     Ambulatory referral to Urology -     PSA, total and free  Type 2  diabetes mellitus with other specified complication, without long-term current use of insulin (HCC) -     Tirzepatide; Inject 12.5 mg into the skin once a week.  Dispense: 2 mL; Refill: 0 -     CBC with Differential/Platelet -     Comprehensive metabolic panel -     Lipid panel -     Hemoglobin A1c -     Microalbumin / creatinine urine ratio -     Empagliflozin; Take 1 tablet (10 mg total) by mouth daily before breakfast.  Dispense: 90 tablet; Refill: 3  Vitamin D deficiency  Low mean corpuscular volume (MCV) -     Thalassemia and Hemoglobinopathy Comprehensive Evaluation -     Ferritin -     Vitamin B12  Stage 3a chronic kidney disease (HCC) -     VITAMIN D 25 Hydroxy (Vit-D Deficiency, Fractures) -     Parathyroid hormone, intact (no Ca) -     Magnesium -     Uric acid -     Empagliflozin; Take 1 tablet (10 mg total) by mouth daily before breakfast.  Dispense: 90 tablet; Refill: 3  Vitamin deficiency -     Ferritin -     Vitamin B12  Chronic diastolic heart failure (HCC) -     Empagliflozin; Take 1 tablet (10 mg total) by mouth daily before breakfast.  Dispense: 90 tablet; Refill: 3  Diabetic nephropathy with proteinuria (HCC) -     Chauncey Mann; Take 1 tablet (20 mg total) by mouth daily. Replaces spironolactone, start at 10 mg for first 4 weeks (half tablets)  Dispense: 90 tablet; Refill: 1  Morbid obesity with BMI of 50.0-59.9, adult (HCC)  Chronic maxillary sinusitis -     Fluticasone Propionate; Place 2 sprays into both nostrils daily.  Dispense: 16 g; Refill: 6

## 2024-01-14 NOTE — Assessment & Plan Note (Signed)
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 Explained genetic implications and importance of testing for family planning. Order blood work to test for thalassemia.

## 2024-01-14 NOTE — Assessment & Plan Note (Signed)
 Check vitamin d

## 2024-01-14 NOTE — Assessment & Plan Note (Signed)
Obesity Currently weighing 369 lbs, down from 385 lbs in September. Mounjaro dose recently increased to 10 mg. Weight maintained despite the holiday season. Emphasized self-love, continuous support, and commitment to a healthier lifestyle. Continue Mounjaro at the increased dose. Encourage a low-carb diet and regular physical activity, such as walking. Schedule follow-up in three months.

## 2024-01-14 NOTE — Assessment & Plan Note (Signed)
Heart Failure No hospitalizations since May. No worsening symptoms like dyspnea or significant edema. Discussed lifestyle choices for remission and extended life expectancy despite an average survival rate under five years. Continue current heart failure management and monitor for symptoms of worsening heart failure.

## 2024-01-14 NOTE — Progress Notes (Signed)
==============================  [facility]: [phone]   -- Medical Office Visit --  Patient: Jason Meyer      Age: 63 y.o.       Sex:  male  Date:   01/14/2024 Today's Healthcare Provider: [name], MD  ==============================   CHIEF COMPLAINT: 1 month follow-up, Congestive Heart Failure, and morbid obesity  SUBJECTIVE: Background This is a 63 y.o. male who has Type 2 diabetes mellitus with other specified complication (HCC); Hypertension; Morbid obesity with BMI of 50.0-59.9, adult (HCC); Mixed hyperlipidemia; Venous stasis; OA (osteoarthritis); DOE (dyspnea on exertion); Chronic diastolic heart failure (HCC); Venous stasis dermatitis of both lower extremities; OSA (obstructive sleep apnea); Erectile dysfunction; Low HDL (under 40); Elevated PSA; Coronary artery calcification; Coronary artery disease; Microcytosis; Low mean corpuscular volume (MCV); On statin therapy; Chronic kidney disease, stage 3 unspecified (HCC); Vitamin D deficiency; Vision problems; and Encounter for disability examination on their problem list.  History of Present Illness The patient presents for follow-up regarding weight management and heart failure.  He has been managing his weight with lifestyle changes and medication, including Mounjaro, which was recently increased to a higher dose. He has lost weight, from 385 pounds in September to 369 pounds currently, although weight loss stalled over the Christmas period. His diet includes baked barbecue chicken and a lot of fruit, which he attributes to his initial weight loss. He is concerned about gaining two pounds over the holidays but was reassured that his weight has remained stable. He is not taking any other weight loss medications besides Mounjaro.  Regarding heart failure, he has not been hospitalized since May of the previous year, indicating improvement. No recent episodes of shortness of breath are  reported, and leg swelling is manageable, decreasing by the evening. He is on Lasix, 40 mg twice a day, and has reduced his amlodipine dosage.  He reports nasal congestion that worsens when lying down, affecting sleep. He has been using a nasal spray but is seeking alternatives to improve symptoms. No recent sinus infections are noted.  He has a history of elevated PSA levels but has not seen a urologist for further evaluation. No symptoms related to prostate issues are reported.  He is currently taking Celebrex for knee pain, aware of its potential impact on kidneys. He has previously tried steroid injections for knee pain with temporary relief. He is not currently seeing a kidney specialist.  He has a low MCV, suggesting possible thalassemia, although no formal diagnosis is known. He has not experienced any broken bones and has received his pneumonia vaccination.  He has a history of low vitamin D levels and has been taking a prescribed supplement.  Family history includes a brother who died at 51 from a heart attack.  He is dealing with disability claims and financial challenges, including fighting for disability benefits and managing debts.  Reviewed chart records that patient  has a past medical history of Allergy, Arthritis, CAP (community acquired pneumonia) (04/12/2023), Chronic diastolic heart failure (HCC) (62/13/0865), Coronary artery calcification (10/19/2022), Hyperlipidemia, Hypertension, OSA (obstructive sleep apnea) (10/19/2022), Sepsis (HCC) (04/12/2023), Sleep apnea, and Type 2 diabetes mellitus (HCC). Discussed Past Medical History - Weight gain - Heart failure - Leg swelling - Shortness of breath - Low vitamin D - Thalassemia - High PSA - Sleep apnea - Hypertension - Knee pain - Urinary tract infection     Problem list overviews that were updated at today's visit:No problems updated.  Today's Verbally  Confirmed Medications - Mounjaro 10 mg - Vitamin D pill -  Adderall - Lasix 40 mg twice a day - Amlodipine - Celebrex - Benicar 40 mg - Spironolactone - Repatha Current Outpatient Medications on File Prior to Visit  Medication Sig   acetaminophen (TYLENOL) 325 MG tablet Take 2 tablets (650 mg total) by mouth every 6 (six) hours as needed for mild pain (or Fever >/= 101).   albuterol (PROVENTIL) (2.5 MG/3ML) 0.083% nebulizer solution Take 3 mLs (2.5 mg total) by nebulization every 6 (six) hours as needed for wheezing or shortness of breath.   albuterol (VENTOLIN HFA) 108 (90 Base) MCG/ACT inhaler Inhale 2 puffs into the lungs every 6 (six) hours as needed for wheezing or shortness of breath.   amLODipine (NORVASC) 5 MG tablet Take 1 tablet (5 mg total) by mouth daily. Reduced dose due to leg swelling.   ASPIRIN EC 81 MG EC tablet Take 1 tablet (81 mg total) by mouth daily.   Azelastine HCl 137 MCG/SPRAY SOLN Place 1 spray into the nose daily.   bisoprolol (ZEBETA) 10 MG tablet Take 2 tablets (20 mg total) by mouth daily.   buPROPion (WELLBUTRIN XL) 150 MG 24 hr tablet Take 1 tablet (150 mg total) by mouth daily.   celecoxib (CELEBREX) 200 MG capsule Take 1 capsule (200 mg total) by mouth 2 (two) times daily. Only take if pain severe. Risk of heart issues.   clotrimazole (LOTRIMIN) 1 % cream Apply topically daily.   Evolocumab (REPATHA SURECLICK) 140 MG/ML SOAJ Inject 140 mg into the skin every 14 (fourteen) days.   famotidine-calcium carbonate-magnesium hydroxide (PEPCID COMPLETE) 10-800-165 MG chewable tablet Chew 1 tablet by mouth 2 (two) times daily as needed.   fluticasone furoate-vilanterol (BREO ELLIPTA) 100-25 MCG/ACT AEPB Inhale 1 puff into the lungs daily.   furosemide (LASIX) 40 MG tablet Take 2 tablets (80 mg total) by mouth daily. Must take with spironolactone to keep potassium up Reduce dose to just 40 mg daily if no fluid is accumulating.   metFORMIN (GLUCOPHAGE) 1000 MG tablet Take 1 tablet (1,000 mg total) by mouth 2 (two) times daily  with a meal.   olmesartan (BENICAR) 40 MG tablet Take 1 tablet (40 mg total) by mouth daily.   ondansetron (ZOFRAN-ODT) 4 MG disintegrating tablet Take 1 tablet (4 mg total) by mouth every 8 (eight) hours as needed for nausea or vomiting.   rosuvastatin (CRESTOR) 40 MG tablet Take 1 tablet (40 mg total) by mouth daily. Replaces atorvastatin which failed to reach Low Density Lipoprotein (LDL cholesterol) goal of 55, take only if repatha not approved/available.   spironolactone (ALDACTONE) 25 MG tablet Take 1 tablet (25 mg total) by mouth daily.   tirzepatide (MOUNJARO) 10 MG/0.5ML Pen Inject 10 mg into the skin once a week.   Vitamin D, Ergocalciferol, (DRISDOL) 1.25 MG (50000 UNIT) CAPS capsule Take 1 capsule (50,000 Units total) by mouth every 7 (seven) days. Once a WEEK ONLY   No current facility-administered medications on file prior to visit.   Medications Discontinued During This Encounter  Medication Reason   fluticasone (FLONASE) 50 MCG/ACT nasal spray Reorder      Objective   Physical Exam     01/14/2024    1:36 PM 11/14/2023    8:49 AM 10/05/2023    1:49 PM  Vitals with BMI  Height 5\' 11"  5\' 11"    Weight 369 lbs 6 oz 370 lbs   BMI 51.54 51.63   Systolic 136 134 161  Diastolic 86 73 82  Pulse 74 82    Wt Readings from Last 10 Encounters:  01/14/24 (!) 369 lb 6.4 oz (167.6 kg)  11/14/23 (!) 370 lb (167.8 kg)  10/05/23 (!) 371 lb 9.6 oz (168.6 kg)  09/07/23 (!) 384 lb 3.2 oz (174.3 kg)  05/08/23 (!) 375 lb 6.4 oz (170.3 kg)  04/25/23 (!) 364 lb (165.1 kg)  04/13/23 (!) 379 lb 3.1 oz (172 kg)  02/06/23 (!) 380 lb 9.6 oz (172.6 kg)  12/26/22 (!) 376 lb 3.2 oz (170.6 kg)  10/19/22 (!) 390 lb (176.9 kg)   Vital signs reviewed.  Nursing notes reviewed. Weight trend reviewed. Abnormalities and Problem-Specific physical exam findings:  truncal adiposity minimal swelling legs  General Appearance:  No acute distress appreciable.   Well-groomed, healthy-appearing male.  Well  proportioned with no abnormal fat distribution.  Good muscle tone. Pulmonary:  Normal work of breathing at rest, no respiratory distress apparent. SpO2: 95 %  Musculoskeletal: All extremities are intact.  Neurological:  Awake, alert, oriented, and engaged.  No obvious focal neurological deficits or cognitive impairments.  Sensorium seems unclouded.   Speech is clear and coherent with logical content. Psychiatric:  Appropriate mood, pleasant and cooperative demeanor, thoughtful and engaged during the exam  Results LABS MCV: low (09/2023) Vitamin D: low (09/2023) PTH: 13 (09/2023) PSA: high (04/2023) No results found for any visits on 01/14/24. Office Visit on 10/05/2023  Component Date Value   Vit D, 25-Hydroxy 10/05/2023 10 (L)    Uric Acid, Serum 10/05/2023 5.4    PTH 10/05/2023 13 (L)    Phosphorus 10/05/2023 3.2    Magnesium 10/05/2023 1.9    Glucose, Bld 10/05/2023 85    BUN 10/05/2023 14    Creat 10/05/2023 1.22    BUN/Creatinine Ratio 10/05/2023 SEE NOTE:    Sodium 10/05/2023 141    Potassium 10/05/2023 4.6    Chloride 10/05/2023 102    CO2 10/05/2023 28    Calcium 10/05/2023 9.8   Office Visit on 09/07/2023  Component Date Value   WBC 09/07/2023 9.5    RBC 09/07/2023 6.28 (H)    Hemoglobin 09/07/2023 15.6    HCT 09/07/2023 49.5    MCV 09/07/2023 78.8 (L)    MCH 09/07/2023 24.8 (L)    MCHC 09/07/2023 31.5 (L)    RDW 09/07/2023 14.8    Platelets 09/07/2023 270    MPV 09/07/2023 10.8    Neutro Abs 09/07/2023 6,603    Lymphs Abs 09/07/2023 1,957    Absolute Monocytes 09/07/2023 751    Eosinophils Absolute 09/07/2023 133    Basophils Absolute 09/07/2023 57    Neutrophils Relative % 09/07/2023 69.5    Total Lymphocyte 09/07/2023 20.6    Monocytes Relative 09/07/2023 7.9    Eosinophils Relative 09/07/2023 1.4    Basophils Relative 09/07/2023 0.6    Glucose, Bld 09/07/2023 78    BUN 09/07/2023 14    Creat 09/07/2023 1.24    BUN/Creatinine Ratio 09/07/2023 SEE  NOTE:    Sodium 09/07/2023 140    Potassium 09/07/2023 4.0    Chloride 09/07/2023 106    CO2 09/07/2023 22    Calcium 09/07/2023 9.2    Total Protein 09/07/2023 6.7    Albumin 09/07/2023 4.1    Globulin 09/07/2023 2.6    AG Ratio 09/07/2023 1.6    Total Bilirubin 09/07/2023 0.4    Alkaline phosphatase (AP* 09/07/2023 75    AST 09/07/2023 21    ALT 09/07/2023 24    Cholesterol  09/07/2023 113    HDL 09/07/2023 37 (L)    Triglycerides 09/07/2023 73    LDL Cholesterol (Calc) 09/07/2023 61    Total CHOL/HDL Ratio 09/07/2023 3.1    Non-HDL Cholesterol (Cal* 09/07/2023 76    Hgb A1c MFr Bld 09/07/2023 6.7 (H)    Mean Plasma Glucose 09/07/2023 146    eAG (mmol/L) 09/07/2023 8.1    Creatinine, Urine 09/07/2023 218    Microalb, Ur 09/07/2023 8.5    Microalb Creat Ratio 09/07/2023 39 (H)   Office Visit on 05/08/2023  Component Date Value   PSA 05/08/2023 11.53 (H)    Sodium 05/08/2023 139    Potassium 05/08/2023 3.9    Chloride 05/08/2023 105    CO2 05/08/2023 24    Glucose, Bld 05/08/2023 140 (H)    BUN 05/08/2023 13    Creatinine, Ser 05/08/2023 1.12    GFR 05/08/2023 71.43    Calcium 05/08/2023 9.0    WBC 05/08/2023 7.9    RBC 05/08/2023 5.90 (H)    Hemoglobin 05/08/2023 14.5    HCT 05/08/2023 45.5    MCV 05/08/2023 77.1 (L)    MCHC 05/08/2023 31.8    RDW 05/08/2023 18.3 (H)    Platelets 05/08/2023 196.0    Neutrophils Relative % 05/08/2023 73.8    Lymphocytes Relative 05/08/2023 18.4    Monocytes Relative 05/08/2023 5.3    Eosinophils Relative 05/08/2023 2.0    Basophils Relative 05/08/2023 0.5    Neutro Abs 05/08/2023 5.8    Lymphs Abs 05/08/2023 1.4    Monocytes Absolute 05/08/2023 0.4    Eosinophils Absolute 05/08/2023 0.2    Basophils Absolute 05/08/2023 0.0    Ferritin 05/08/2023 38.7   No results displayed because visit has over 200 results.    No image results found. No results found.CT Angio Chest PE W and/or Wo Contrast Result Date: 04/12/2023 CLINICAL  DATA:  Cough fever EXAM: CT ANGIOGRAPHY CHEST WITH CONTRAST TECHNIQUE: Multidetector CT imaging of the chest was performed using the standard protocol during bolus administration of intravenous contrast. Multiplanar CT image reconstructions and MIPs were obtained to evaluate the vascular anatomy. RADIATION DOSE REDUCTION: This exam was performed according to the departmental dose-optimization program which includes automated exposure control, adjustment of the mA and/or kV according to patient size and/or use of iterative reconstruction technique. CONTRAST:  OMNIPAQUE IOHEXOL 350 MG/ML SOLN COMPARISON:  Chest xray 04/12/2023, CT chest 07/30/2022 FINDINGS: Cardiovascular: Satisfactory opacification of the pulmonary arteries to the segmental level. No evidence of pulmonary embolism. Normal heart size. No pericardial effusion.Nonaneurysmal aorta. Mediastinum/Nodes: Stable to slight increased mediastinal adenopathy for example subcarinal node measures 2.2 cm compared with 1.8 cm previously. Thyroid gland, trachea, and esophagus demonstrate no significant findings. Lungs/Pleura: Heterogenous consolidations in the left greater than right lower lobes. No pleural effusion. No pneumothorax Upper Abdomen: No acute abnormality. Musculoskeletal: No chest wall abnormality. No acute or significant osseous findings. Review of the MIP images confirms the above findings. IMPRESSION: 1. Negative for acute pulmonary embolus. 2. Heterogenous consolidations in the left greater than right lower lobes consistent with pneumonia. 3. Stable to slight increased mediastinal adenopathy, likely reactive. Electronically Signed   By: Jasmine Pang M.D.   On: 04/12/2023 02:54   DG Chest Port 1 View Result Date: 04/12/2023 CLINICAL DATA:  Cough, chills, fever, congestion. EXAM: PORTABLE CHEST 1 VIEW COMPARISON:  Chest radiographs and CT 07/30/2022 FINDINGS: Stable cardiomediastinal silhouette. Bibasilar atelectasis or infiltrates. No pleural  effusion or pneumothorax. No displaced rib fractures. IMPRESSION: Bibasilar atelectasis  or infiltrates. Electronically Signed   By: Minerva Fester M.D.   On: 04/12/2023 00:51       Assessment & Plan Type 2 diabetes mellitus with other specified complication, without long-term current use of insulin (HCC) Diabetes Mellitus On Mounjaro for diabetes management. Blood sugar control might be improving. Due for diabetes lab work. Order diabetes lab work including A1c. Elevated PSA Prostate Health High PSA in May of last year, not seen a urologist. Emphasized early detection and treatment to extend life expectancy. Recheck PSA and refer to urology for further evaluation and possible biopsy. Vitamin D deficiency Check vitamin d Low mean corpuscular volume (MCV) Possible Thalassemia Low MCV suggesting thalassemia. Explained genetic implications and importance of testing for family planning. Order blood work to test for thalassemia. Stage 3a chronic kidney disease (HCC) Chronic Kidney Disease On Lasix. Discussed potential renal strain from Celebrex and importance of fluid intake. Considered Jardiance for renal and cardiac protection. Discussed insurance barriers to [place] and potential benefits over spironolactone. Limit Celebrex use and encourage adequate fluid intake when taking Celebrex. Consider Jardiance at a low dose. Send in [place] with a special code for insurance coverage. Vitamin deficiency Vitamin D Deficiency Very low vitamin D level in October, taking a vitamin D supplement. Recheck vitamin D levels. Chronic diastolic heart failure (HCC) Heart Failure No hospitalizations since May. No worsening symptoms like dyspnea or significant edema. Discussed lifestyle choices for remission and extended life expectancy despite an average survival rate under five years. Continue current heart failure management and monitor for symptoms of worsening heart failure. Diabetic nephropathy with  proteinuria (HCC) Start kerendia if we can obtain Morbid obesity with BMI of 50.0-59.9, adult (HCC) Obesity Currently weighing 369 lbs, down from 385 lbs in September. Mounjaro dose recently increased to 10 mg. Weight maintained despite the holiday season. Emphasized self-love, continuous support, and commitment to a healthier lifestyle. Continue Mounjaro at the increased dose. Encourage a low-carb diet and regular physical activity, such as walking. Schedule follow-up in three months. Chronic maxillary sinusitis flonase  General Health Maintenance Overdue for an eye exam, received pneumonia shots. Encouraged diabetic eye exam and routine lab work every three months until stable. Schedule diabetic eye exam and order routine lab work every three months until stable. Schedule follow-up in three months.     Orders Placed During this Encounter:   Orders Placed This Encounter  Procedures   CBC with Differential/Platelet   Comprehensive metabolic panel    [facility]    Has the patient fasted?:   No   Lipid panel    [place]    Has the patient fasted?:   No   Hemoglobin A1c    [place]   Microalbumin / creatinine urine ratio   Thalassemia and Hemoglobinopathy Comprehensive Evaluation    Release to patient:   Immediate [1]    Remote health to draw?:   No   Vitamin D (25 hydroxy)   PTH, intact (no Ca)   PSA, total and free   Magnesium   Uric acid   Ferritin   B12   Ambulatory referral to Urology    Referral Priority:   Routine    Referral Reason:   Specialty Services Required    Number of Visits Requested:   1   Meds ordered this encounter  Medications   tirzepatide (MOUNJARO) 12.5 MG/0.5ML Pen    Sig: Inject 12.5 mg into the skin once a week.    Dispense:  2 mL    Refill:  0   fluticasone (FLONASE) 50 MCG/ACT nasal spray    Sig: Place 2 sprays into both nostrils daily.    Dispense:  16 g    Refill:  6   empagliflozin (JARDIANCE) 10 MG TABS tablet    Sig: Take 1 tablet (10 mg  total) by mouth daily before breakfast.    Dispense:  90 tablet    Refill:  3   Finerenone (KERENDIA) 20 MG TABS    Sig: Take 1 tablet (20 mg total) by mouth daily. Replaces spironolactone, start at 10 mg for first 4 weeks (half tablets)    Dispense:  90 tablet    Refill:  1       This document was synthesized by artificial intelligence (Abridge) using HIPAA-compliant recording of the clinical interaction;   We discussed the use of AI scribe software for clinical note transcription with the patient, who gave verbal consent to proceed.    Additional Info: This encounter employed state-of-the-art, real-time, collaborative documentation. The patient actively reviewed and assisted in updating their electronic medical record on a shared screen, ensuring transparency and facilitating joint problem-solving for the problem list, overview, and plan. This approach promotes accurate, informed care. The treatment plan was discussed and reviewed in detail, including medication safety, potential side effects, and all patient questions. We confirmed understanding and comfort with the plan. Follow-up instructions were established, including contacting the office for any concerns, returning if symptoms worsen, persist, or new symptoms develop, and precautions for potential emergency department visits.

## 2024-01-15 LAB — COMPREHENSIVE METABOLIC PANEL
ALT: 15 U/L (ref 0–53)
AST: 16 U/L (ref 0–37)
Albumin: 4.1 g/dL (ref 3.5–5.2)
Alkaline Phosphatase: 67 U/L (ref 39–117)
BUN: 14 mg/dL (ref 6–23)
CO2: 28 meq/L (ref 19–32)
Calcium: 9.3 mg/dL (ref 8.4–10.5)
Chloride: 103 meq/L (ref 96–112)
Creatinine, Ser: 1.33 mg/dL (ref 0.40–1.50)
GFR: 57.84 mL/min — ABNORMAL LOW (ref >60.00)
Glucose, Bld: 89 mg/dL (ref 70–99)
Potassium: 4.3 meq/L (ref 3.5–5.1)
Sodium: 140 meq/L (ref 135–145)
Total Bilirubin: 0.5 mg/dL (ref 0.2–1.2)
Total Protein: 7.1 g/dL (ref 6.0–8.3)

## 2024-01-15 LAB — URIC ACID: Uric Acid, Serum: 5.4 mg/dL (ref 4.0–7.8)

## 2024-01-15 LAB — MAGNESIUM: Magnesium: 1.7 mg/dL (ref 1.5–2.5)

## 2024-01-15 LAB — LIPID PANEL
Cholesterol: 125 mg/dL (ref 0–200)
HDL: 40.1 mg/dL (ref >39.00)
LDL Cholesterol: 66 mg/dL (ref 0–99)
NonHDL: 84.77
Total CHOL/HDL Ratio: 3
Triglycerides: 93 mg/dL (ref 0.0–149.0)
VLDL: 18.6 mg/dL (ref 0.0–40.0)

## 2024-01-15 LAB — VITAMIN D 25 HYDROXY (VIT D DEFICIENCY, FRACTURES): VITD: 32.3 ng/mL (ref 30.00–100.00)

## 2024-01-15 LAB — CBC WITH DIFFERENTIAL/PLATELET
Basophils Absolute: 0.1 10*3/uL (ref 0.0–0.1)
Basophils Relative: 0.9 % (ref 0.0–3.0)
Eosinophils Absolute: 0.2 10*3/uL (ref 0.0–0.7)
Eosinophils Relative: 1.9 % (ref 0.0–5.0)
HCT: 47.8 % (ref 39.0–52.0)
Hemoglobin: 15.4 g/dL (ref 13.0–17.0)
Lymphocytes Relative: 18.1 % (ref 12.0–46.0)
Lymphs Abs: 1.6 10*3/uL (ref 0.7–4.0)
MCHC: 32.2 g/dL (ref 30.0–36.0)
MCV: 79.7 fL (ref 78.0–100.0)
Monocytes Absolute: 0.4 10*3/uL (ref 0.1–1.0)
Monocytes Relative: 4.8 % (ref 3.0–12.0)
Neutro Abs: 6.7 10*3/uL (ref 1.4–7.7)
Neutrophils Relative %: 74.3 % (ref 43.0–77.0)
Platelets: 220 10*3/uL (ref 150.0–400.0)
RBC: 5.99 Mil/uL — ABNORMAL HIGH (ref 4.22–5.81)
RDW: 17.8 % — ABNORMAL HIGH (ref 11.5–15.5)
WBC: 9.1 10*3/uL (ref 4.0–10.5)

## 2024-01-15 LAB — MICROALBUMIN / CREATININE URINE RATIO
Creatinine,U: 159.1 mg/dL
Microalb Creat Ratio: 6.2 mg/g (ref 0.0–30.0)
Microalb, Ur: 9.9 mg/dL — ABNORMAL HIGH (ref 0.0–1.9)

## 2024-01-15 LAB — HEMOGLOBIN A1C: Hgb A1c MFr Bld: 6.3 % (ref 4.6–6.5)

## 2024-01-15 LAB — FERRITIN: Ferritin: 44 ng/mL (ref 22.0–322.0)

## 2024-01-15 LAB — VITAMIN B12: Vitamin B-12: 212 pg/mL (ref 211–911)

## 2024-01-17 ENCOUNTER — Telehealth: Payer: Self-pay

## 2024-01-17 ENCOUNTER — Other Ambulatory Visit: Payer: Self-pay | Admitting: Internal Medicine

## 2024-01-17 ENCOUNTER — Encounter: Payer: Self-pay | Admitting: Internal Medicine

## 2024-01-17 DIAGNOSIS — I872 Venous insufficiency (chronic) (peripheral): Secondary | ICD-10-CM

## 2024-01-17 NOTE — Telephone Encounter (Signed)
 Pharmacy Patient Advocate Encounter   Received notification from  [facility] Portal that prior authorization for Kerendia  20MG  tablets is required/requested.   Insurance verification completed.   The patient is insured through [facility] .   Per test claim: PA required; PA submitted to above mentioned insurance via CoverMyMeds Key/confirmation #/EOC  [id number] Status is pending

## 2024-01-27 ENCOUNTER — Encounter: Payer: Self-pay | Admitting: Internal Medicine

## 2024-01-28 ENCOUNTER — Other Ambulatory Visit (HOSPITAL_COMMUNITY): Payer: Self-pay

## 2024-01-28 NOTE — Telephone Encounter (Signed)
 Pharmacy Patient Advocate Encounter  Received notification from [facility] that Prior Authorization for Kerendia 20MG  tablets  has been APPROVED from 01/24/24 to 01/23/25. Ran test claim, Copay is $0. This test claim was processed through [facility]- copay amounts may vary at other pharmacies due to pharmacy/plan contracts, or as the patient moves through the different stages of their insurance plan.   PA #/Case ID/Reference #: [id number]

## 2024-01-29 LAB — THALASSEMIA AND HEMOGLOBINOPATHY COMPREHENSIVE EVALUATION
Ferritin: 55 ng/mL (ref 24–380)
Fetal Hemoglobin Testing: 0 % (ref <2.0)
HCT: 50.2 % — ABNORMAL HIGH (ref 38.5–50.0)
Hemoglobin A2 - HGBRFX: 2.5 % (ref 2.0–3.2)
Hemoglobin: 15.5 g/dL (ref 13.2–17.1)
Hgb A: 97.5 % (ref >96.0)
MCH: 25.2 pg — ABNORMAL LOW (ref 27.0–33.0)
MCHC: 30.9 g/dL — ABNORMAL LOW (ref 32.0–36.0)
MCV: 81.6 fL (ref 80.0–100.0)
RBC: 6.15 10*6/uL — ABNORMAL HIGH (ref 4.20–5.80)
RDW: 17.3 % — ABNORMAL HIGH (ref 11.0–15.0)

## 2024-01-29 LAB — GENO PHENO REVIEW

## 2024-01-29 LAB — PSA, TOTAL AND FREE
PSA, Free: 1 ng/mL
PSA, Total: 11.7 ng/mL — ABNORMAL HIGH (ref <=4.0)

## 2024-01-29 LAB — PARATHYROID HORMONE, INTACT (NO CA): PTH: 35 pg/mL (ref 16–77)

## 2024-01-29 LAB — ALPHA THAL MUT ANALYSIS

## 2024-01-29 NOTE — Telephone Encounter (Signed)
 Informed patient of lab results/notes. Mailing lab results/notes to patient per his request (he can't access his my chart).

## 2024-01-30 NOTE — Telephone Encounter (Signed)
 LVM with information Rx Chauncey Mann approved with Copay $0

## 2024-02-19 ENCOUNTER — Other Ambulatory Visit: Payer: Self-pay | Admitting: Internal Medicine

## 2024-02-19 DIAGNOSIS — E1169 Type 2 diabetes mellitus with other specified complication: Secondary | ICD-10-CM

## 2024-03-18 ENCOUNTER — Other Ambulatory Visit: Payer: Self-pay | Admitting: Internal Medicine

## 2024-03-18 NOTE — Telephone Encounter (Signed)
 LOV:01/14/2024 QUAT:90TABS WITH 4 REFILLS NEXT VISIT:04/14/2024 LAST FILLED:02/15/2024

## 2024-03-25 ENCOUNTER — Telehealth: Payer: Self-pay

## 2024-03-25 NOTE — Telephone Encounter (Signed)
 Copied from CRM [id number]. Topic: General - Other >> Mar 25, 2024 10:32 AM Hobson Luna F wrote: Reason for CRM: Patient is calling in returning a call from the office. Please follow up with patient. This is the best number to reach him [phone].

## 2024-04-05 ENCOUNTER — Other Ambulatory Visit: Payer: Self-pay | Admitting: Internal Medicine

## 2024-04-05 DIAGNOSIS — E559 Vitamin D deficiency, unspecified: Secondary | ICD-10-CM

## 2024-04-07 ENCOUNTER — Other Ambulatory Visit: Payer: Self-pay | Admitting: Internal Medicine

## 2024-04-07 DIAGNOSIS — E1169 Type 2 diabetes mellitus with other specified complication: Secondary | ICD-10-CM

## 2024-04-14 ENCOUNTER — Ambulatory Visit: Payer: 59 | Admitting: Internal Medicine

## 2024-04-14 ENCOUNTER — Encounter: Payer: Self-pay | Admitting: Internal Medicine

## 2024-04-14 VITALS — BP 150/88 | HR 74 | Temp 98.0°F | Ht 71.0 in | Wt 371.8 lb

## 2024-04-14 DIAGNOSIS — E559 Vitamin D deficiency, unspecified: Secondary | ICD-10-CM

## 2024-04-14 DIAGNOSIS — I152 Hypertension secondary to endocrine disorders: Secondary | ICD-10-CM

## 2024-04-14 DIAGNOSIS — R718 Other abnormality of red blood cells: Secondary | ICD-10-CM

## 2024-04-14 DIAGNOSIS — Z7985 Long-term (current) use of injectable non-insulin antidiabetic drugs: Secondary | ICD-10-CM

## 2024-04-14 DIAGNOSIS — R7989 Other specified abnormal findings of blood chemistry: Secondary | ICD-10-CM

## 2024-04-14 DIAGNOSIS — E1169 Type 2 diabetes mellitus with other specified complication: Secondary | ICD-10-CM

## 2024-04-14 DIAGNOSIS — Z5987 Material hardship due to limited financial resources, not elsewhere classified: Secondary | ICD-10-CM

## 2024-04-14 DIAGNOSIS — Z6841 Body Mass Index (BMI) 40.0 and over, adult: Secondary | ICD-10-CM | POA: Diagnosis not present

## 2024-04-14 DIAGNOSIS — N1831 Chronic kidney disease, stage 3a: Secondary | ICD-10-CM

## 2024-04-14 DIAGNOSIS — I5032 Chronic diastolic (congestive) heart failure: Secondary | ICD-10-CM

## 2024-04-14 DIAGNOSIS — B351 Tinea unguium: Secondary | ICD-10-CM

## 2024-04-14 DIAGNOSIS — R972 Elevated prostate specific antigen [PSA]: Secondary | ICD-10-CM

## 2024-04-14 LAB — CBC WITH DIFFERENTIAL/PLATELET
Basophils Absolute: 0.1 10*3/uL (ref 0.0–0.1)
Basophils Relative: 0.6 % (ref 0.0–3.0)
Eosinophils Absolute: 0.3 10*3/uL (ref 0.0–0.7)
Eosinophils Relative: 3.1 % (ref 0.0–5.0)
HCT: 47.7 % (ref 39.0–52.0)
Hemoglobin: 15.6 g/dL (ref 13.0–17.0)
Lymphocytes Relative: 17.2 % (ref 12.0–46.0)
Lymphs Abs: 1.5 10*3/uL (ref 0.7–4.0)
MCHC: 32.8 g/dL (ref 30.0–36.0)
MCV: 79.5 fl (ref 78.0–100.0)
Monocytes Absolute: 0.5 10*3/uL (ref 0.1–1.0)
Monocytes Relative: 6.2 % (ref 3.0–12.0)
Neutro Abs: 6.2 10*3/uL (ref 1.4–7.7)
Neutrophils Relative %: 72.9 % (ref 43.0–77.0)
Platelets: 175 10*3/uL (ref 150.0–400.0)
RBC: 6 Mil/uL — ABNORMAL HIGH (ref 4.22–5.81)
RDW: 16.3 % — ABNORMAL HIGH (ref 11.5–15.5)
WBC: 8.5 10*3/uL (ref 4.0–10.5)

## 2024-04-14 LAB — COMPREHENSIVE METABOLIC PANEL WITH GFR
ALT: 15 U/L (ref 0–53)
AST: 15 U/L (ref 0–37)
Albumin: 4 g/dL (ref 3.5–5.2)
Alkaline Phosphatase: 63 U/L (ref 39–117)
BUN: 13 mg/dL (ref 6–23)
CO2: 25 meq/L (ref 19–32)
Calcium: 9 mg/dL (ref 8.4–10.5)
Chloride: 105 meq/L (ref 96–112)
Creatinine, Ser: 1.38 mg/dL (ref 0.40–1.50)
GFR: 55.24 mL/min — ABNORMAL LOW (ref >60.00)
Glucose, Bld: 110 mg/dL — ABNORMAL HIGH (ref 70–99)
Potassium: 4.2 meq/L (ref 3.5–5.1)
Sodium: 139 meq/L (ref 135–145)
Total Bilirubin: 0.6 mg/dL (ref 0.2–1.2)
Total Protein: 6.5 g/dL (ref 6.0–8.3)

## 2024-04-14 LAB — PSA: PSA: 11.46 ng/mL — ABNORMAL HIGH (ref 0.10–4.00)

## 2024-04-14 LAB — VITAMIN D 25 HYDROXY (VIT D DEFICIENCY, FRACTURES): VITD: 23.85 ng/mL — ABNORMAL LOW (ref 30.00–100.00)

## 2024-04-14 LAB — HEMOGLOBIN A1C: Hgb A1c MFr Bld: 6.5 % (ref 4.6–6.5)

## 2024-04-14 NOTE — Progress Notes (Unsigned)
 ==============================  [facility]: [id number]   -- Medical Office Visit --  Patient: Jason Meyer      Age: 63 y.o.       Sex:  male  Date:   04/14/2024 Today's Healthcare Provider: Anthon Kins, MD  ==============================   Chief Complaint: Diabetes (Pt has some questions about disability.) Per patient report - short term disability via truist is stopping payment saying they don't have medical records he has already provided. History of Present Illness 63 year old male with elevated PSA and diabetes who presents for follow-up on multiple health issues.  He has an elevated PSA, with previous levels recorded at 8.9 in 2023 and 11.5 in 2024. He has not yet seen a urologist for further evaluation. He is unsure about the process and location for a potential prostate biopsy.  He is experiencing issues with his disability insurance, as he claims not to have received his medical records despite his efforts to provide them. He has been out of work since October 31st and is concerned about the lack of payments. He is scheduled to see a state doctor for his disability claim.  He is currently taking Mounjaro  for weight management but had a three-week lapse in medication due to financial constraints. He has resumed the medication recently. He reports a weight reduction from 416 pounds to around 370 pounds, although his weight has fluctuated slightly.  He has a history of heart failure but reports no worsening of breathing. He denies sleeping on his back, preferring to sleep on his side. He has undergone a sleep apnea test and uses a CPAP machine.  He is aware of a kidney problem mentioned previously but has not had recent lab work due to financial concerns. He maintains insurance coverage by working two days a week.  He is diabetic and is on Mounjaro  and another unspecified medication. His A1c was 6.3 three months ago, but he is  concerned about the impact of the recent lapse in Mounjaro .  He has low vitamin D  levels and is taking a replacement once a week. He has small red blood cells but previous iron studies were normal. He has not seen an eye doctor recently despite high blood pressure.  He uses a CPAP machine for sleep apnea and has a high morning cortisol level, raising concerns about Cushing's disease. He has not seen an endocrinologist due to financial constraints.  Lab Results  Component Value Date   PSA1 8.9 (H) 12/22/2021   PSA 11.46 (H) 04/14/2024   PSA 11.53 (H) 05/08/2023   PSA 2.2 07/06/2014  Didn't see urology says he wasn't called - Urology (urgent - placed 01/14/24)  Wt Readings from Last 10 Encounters:  04/14/24 (!) 371 lb 12.8 oz (168.6 kg)  01/14/24 (!) 369 lb 6.4 oz (167.6 kg)  11/14/23 (!) 370 lb (167.8 kg)  10/05/23 (!) 371 lb 9.6 oz (168.6 kg)  09/07/23 (!) 384 lb 3.2 oz (174.3 kg)  05/08/23 (!) 375 lb 6.4 oz (170.3 kg)  04/25/23 (!) 364 lb (165.1 kg)  04/13/23 (!) 379 lb 3.1 oz (172 kg)  02/06/23 (!) 380 lb 9.6 oz (172.6 kg)  12/26/22 (!) 376 lb 3.2 oz (170.6 kg)  Ran out of Mounjaro  due to financial issues but has again.  Leg swelling down, can lie flat on back, breathing is no worse.  Still has insurance agreeable to recheck kidneys and vitamin D    Diabetes mellitus is ok when not  out of medication(s)    Clinical symptoms that can be associated with vitamin D  deficiency include bone pain, muscle weakness, and general fatigue Vitamin D  levels are affected by dietary choices and sunlight exposure  Lab Results  Component Value Date   VD25OH 23.85 (L) 04/14/2024   VD25OH 32.30 01/14/2024   VD25OH 10 (L) 10/05/2023   VD25OH 9.1 (L) 01/06/2015   Lab Results  Component Value Date   TSH 1.650 10/13/2021   ALKPHOS 63 04/14/2024   PTH 35 01/14/2024   CALCIUM  9.0 04/14/2024   PHOS 3.2 10/05/2023   Lab Results  Component Value Date   CALCIUM  9.0 04/14/2024   CALCIUM  9.3  01/14/2024   CALCIUM  9.8 10/05/2023   CALCIUM  9.2 09/07/2023   CALCIUM  9.0 05/08/2023  He reports he is taking 16109 weekly  Background Reviewed: Problem List: has Type 2 diabetes mellitus with other specified complication (HCC); Hypertension; Morbid obesity with BMI of 50.0-59.9, adult (HCC); Mixed hyperlipidemia; Venous stasis; OA (osteoarthritis); DOE (dyspnea on exertion); Chronic diastolic heart failure (HCC); Venous stasis dermatitis of both lower extremities; OSA (obstructive sleep apnea); Erectile dysfunction; Low HDL (under 40); Elevated PSA; Coronary artery calcification; Coronary artery disease; On statin therapy; Chronic kidney disease, stage 3 unspecified (HCC); Vitamin D  deficiency; Vision problems; Encounter for disability examination; and Thalassemia carrier on their problem list. Past Medical History:  has a past medical history of Allergy, Arthritis, CAP (community acquired pneumonia) (04/12/2023), Chronic diastolic heart failure (HCC) (60/45/4098), Coronary artery calcification (10/19/2022), Hyperlipidemia, Hypertension, OSA (obstructive sleep apnea) (10/19/2022), Sepsis (HCC) (04/12/2023), Sleep apnea, and Type 2 diabetes mellitus (HCC). Past Surgical History:   has a past surgical history that includes No past surgeries. Social History:   reports that he quit smoking about 16 years ago. His smoking use included cigarettes. He started smoking about 49 years ago. He has a 33 pack-year smoking history. He has never used smokeless tobacco. He reports that he does not currently use alcohol. He reports that he does not use drugs. Family History:  family history includes Alzheimer's disease in his mother; Heart attack in his brother and father; Heart disease in his father; Hypertension in his father; Stroke in his father. Allergies:  is allergic to penicillins.   Medication Reconciliation: Current Outpatient Medications on File Prior to Visit  Medication Sig  . acetaminophen  (TYLENOL )  325 MG tablet Take 2 tablets (650 mg total) by mouth every 6 (six) hours as needed for mild pain (or Fever >/= 101).  . albuterol  (PROVENTIL ) (2.5 MG/3ML) 0.083% nebulizer solution Take 3 mLs (2.5 mg total) by nebulization every 6 (six) hours as needed for wheezing or shortness of breath.  . albuterol  (VENTOLIN  HFA) 108 (90 Base) MCG/ACT inhaler Inhale 2 puffs into the lungs every 6 (six) hours as needed for wheezing or shortness of breath.  . amLODipine  (NORVASC ) 5 MG tablet Take 1 tablet (5 mg total) by mouth daily. Reduced dose due to leg swelling.  . ASPIRIN  EC 81 MG EC tablet Take 1 tablet (81 mg total) by mouth daily.  . Azelastine  HCl 137 MCG/SPRAY SOLN Place 1 spray into the nose daily.  . buPROPion  (WELLBUTRIN  XL) 150 MG 24 hr tablet TAKE 1 TABLET BY MOUTH EVERY DAY  . celecoxib  (CELEBREX ) 200 MG capsule Take 1 capsule (200 mg total) by mouth 2 (two) times daily. Only take if pain severe. Risk of heart issues.  . clotrimazole  (LOTRIMIN ) 1 % cream Apply topically daily.  . clotrimazole -betamethasone  (LOTRISONE ) cream APPLY TO AFFECTED AREA  TWICE A DAY  . empagliflozin  (JARDIANCE ) 10 MG TABS tablet Take 1 tablet (10 mg total) by mouth daily before breakfast.  . Evolocumab  (REPATHA  SURECLICK) 140 MG/ML SOAJ Inject 140 mg into the skin every 14 (fourteen) days.  . famotidine -calcium  carbonate-magnesium hydroxide (PEPCID  COMPLETE) 10-800-165 MG chewable tablet Chew 1 tablet by mouth 2 (two) times daily as needed.  . Finerenone  (KERENDIA ) 20 MG TABS Take 1 tablet (20 mg total) by mouth daily. Replaces spironolactone , start at 10 mg for first 4 weeks (half tablets)  . fluticasone  (FLONASE ) 50 MCG/ACT nasal spray Place 2 sprays into both nostrils daily.  . fluticasone  furoate-vilanterol (BREO ELLIPTA ) 100-25 MCG/ACT AEPB Inhale 1 puff into the lungs daily.  . furosemide  (LASIX ) 40 MG tablet Take 2 tablets (80 mg total) by mouth daily. Must take with spironolactone  to keep potassium up Reduce dose to  just 40 mg daily if no fluid is accumulating.  . metFORMIN  (GLUCOPHAGE ) 1000 MG tablet Take 1 tablet (1,000 mg total) by mouth 2 (two) times daily with a meal.  . MOUNJARO  12.5 MG/0.5ML Pen INJECT 12.5MG  ONCE WEEKLY  . olmesartan  (BENICAR ) 40 MG tablet Take 1 tablet (40 mg total) by mouth daily.  . rosuvastatin  (CRESTOR ) 40 MG tablet Take 1 tablet (40 mg total) by mouth daily. Replaces atorvastatin  which failed to reach Low Density Lipoprotein (LDL cholesterol) goal of 55, take only if repatha  not approved/available.  . spironolactone  (ALDACTONE ) 25 MG tablet Take 1 tablet (25 mg total) by mouth daily.  . Vitamin D , Ergocalciferol , (DRISDOL ) 1.25 MG (50000 UNIT) CAPS capsule TAKE 1 CAPSULE (50,000 UNITS TOTAL) BY MOUTH EVERY 7 (SEVEN) DAYS. ONCE A WEEK ONLY  . bisoprolol  (ZEBETA ) 10 MG tablet Take 2 tablets (20 mg total) by mouth daily.  . ondansetron  (ZOFRAN -ODT) 4 MG disintegrating tablet Take 1 tablet (4 mg total) by mouth every 8 (eight) hours as needed for nausea or vomiting. (Patient not taking: Reported on 04/14/2024)   No current facility-administered medications on file prior to visit.  There are no discontinued medications.   Physical Exam:    04/14/2024    9:51 AM 01/14/2024    1:36 PM 11/14/2023    8:49 AM  Vitals with BMI  Height 5\' 11"  5\' 11"  5\' 11"   Weight 371 lbs 13 oz 369 lbs 6 oz 370 lbs  BMI 51.88 51.54 51.63  Systolic 150 136 409  Diastolic 88 86 73  Pulse 74 74 82  Vital signs reviewed.  Nursing notes reviewed. Weight trend reviewed. Physical Exam General Appearance:  No acute distress appreciable.   Well-groomed, healthy-appearing male.  Well proportioned with no abnormal fat distribution.  Good muscle tone. Pulmonary:  Normal work of breathing at rest, no respiratory distress apparent. SpO2: 98 %  Musculoskeletal: All extremities are intact.  Neurological:  Awake, alert, oriented, and engaged.  No obvious focal neurological deficits or cognitive impairments.  Sensorium  seems unclouded.   Speech is clear and coherent with logical content. Psychiatric:  Appropriate mood, pleasant and cooperative demeanor, thoughtful and engaged during the exam Physical Exam EXTREMITIES: Feet with poor appearance, cyanosis of toes, and fungal infection on toe. Diabetic Foot Exam: Appearance - no lesions, ulcers or calluses Skin - no sigificant pallor or erythema but there is blueness Photographs Taken 04/14/2024 :     Monofilament testing - sensitive bilaterally in following locations:  Right - Great toe, medial, central, lateral ball and posterior foot intact  Left - Great toe, medial, central, lateral ball and posterior  foot intact Pulses - +1 distally bilaterally   Diabetic foot exam was performed with the following findings:   No deformities, ulcerations, or other skin breakdown Normal sensation of 10g monofilament Intact posterior tibialis and dorsalis pedis pulses Diabetic Foot Exam: Appearance - no lesions, ulcers or calluses Skin - no sigificant pallor or erythema but there is blueness Photographs Taken 04/14/2024  show blueness and onychomycosis.    Monofilament testing - sensitive bilaterally in following locations:  Right - Great toe, medial, central, lateral ball and posterior foot intact  Left - Great toe, medial, central, lateral ball and posterior foot intact Pulses - +1 distally bilaterally     {Insert previous labs (optional):23779} {See past labs  Heme  Chem  Endocrine  Serology  Results Review (optional):1} Results for orders placed or performed in visit on 04/14/24  Comp Met (CMET)  Result Value Ref Range   Sodium 139 135 - 145 mEq/L   Potassium 4.2 3.5 - 5.1 mEq/L   Chloride 105 96 - 112 mEq/L   CO2 25 19 - 32 mEq/L   Glucose, Bld 110 (H) 70 - 99 mg/dL   BUN 13 6 - 23 mg/dL   Creatinine, Ser 7.84 0.40 - 1.50 mg/dL   Total Bilirubin 0.6 0.2 - 1.2 mg/dL   Alkaline Phosphatase 63 39 - 117 U/L   AST 15 0 - 37 U/L   ALT 15 0 - 53 U/L    Total Protein 6.5 6.0 - 8.3 g/dL   Albumin 4.0 3.5 - 5.2 g/dL   GFR 69.62 (L) >95.28 mL/min   Calcium  9.0 8.4 - 10.5 mg/dL  Vitamin D  (25 hydroxy)  Result Value Ref Range   VITD 23.85 (L) 30.00 - 100.00 ng/mL  PSA  Result Value Ref Range   PSA 11.46 (H) 0.10 - 4.00 ng/mL  HgB A1c  Result Value Ref Range   Hgb A1c MFr Bld 6.5 4.6 - 6.5 %  CBC with Differential/Platelet  Result Value Ref Range   WBC 8.5 4.0 - 10.5 K/uL   RBC 6.00 (H) 4.22 - 5.81 Mil/uL   Hemoglobin 15.6 13.0 - 17.0 g/dL   HCT 41.3 24.4 - 01.0 %   MCV 79.5 78.0 - 100.0 fl   MCHC 32.8 30.0 - 36.0 g/dL   RDW 27.2 (H) 53.6 - 64.4 %   Platelets 175.0 150.0 - 400.0 K/uL   Neutrophils Relative % 72.9 43.0 - 77.0 %   Lymphocytes Relative 17.2 12.0 - 46.0 %   Monocytes Relative 6.2 3.0 - 12.0 %   Eosinophils Relative 3.1 0.0 - 5.0 %   Basophils Relative 0.6 0.0 - 3.0 %   Neutro Abs 6.2 1.4 - 7.7 K/uL   Lymphs Abs 1.5 0.7 - 4.0 K/uL   Monocytes Absolute 0.5 0.1 - 1.0 K/uL   Eosinophils Absolute 0.3 0.0 - 0.7 K/uL   Basophils Absolute 0.1 0.0 - 0.1 K/uL   Office Visit on 04/14/2024  Component Date Value  . Sodium 04/14/2024 139   . Potassium 04/14/2024 4.2   . Chloride 04/14/2024 105   . CO2 04/14/2024 25   . Glucose, Bld 04/14/2024 110 (H)   . BUN 04/14/2024 13   . Creatinine, Ser 04/14/2024 1.38   . Total Bilirubin 04/14/2024 0.6   . Alkaline Phosphatase 04/14/2024 63   . AST 04/14/2024 15   . ALT 04/14/2024 15   . Total Protein 04/14/2024 6.5   . Albumin 04/14/2024 4.0   . GFR 04/14/2024 55.24 (L)   .  Calcium  04/14/2024 9.0   . VITD 04/14/2024 23.85 (L)   . PSA 04/14/2024 11.46 (H)   . Hgb A1c MFr Bld 04/14/2024 6.5   . WBC 04/14/2024 8.5   . RBC 04/14/2024 6.00 (H)   . Hemoglobin 04/14/2024 15.6   . HCT 04/14/2024 47.7   . MCV 04/14/2024 79.5   . MCHC 04/14/2024 32.8   . RDW 04/14/2024 16.3 (H)   . Platelets 04/14/2024 175.0   . Neutrophils Relative % 04/14/2024 72.9   . Lymphocytes Relative  04/14/2024 17.2   . Monocytes Relative 04/14/2024 6.2   . Eosinophils Relative 04/14/2024 3.1   . Basophils Relative 04/14/2024 0.6   . Neutro Abs 04/14/2024 6.2   . Lymphs Abs 04/14/2024 1.5   . Monocytes Absolute 04/14/2024 0.5   . Eosinophils Absolute 04/14/2024 0.3   . Basophils Absolute 04/14/2024 0.1   Office Visit on 01/14/2024  Component Date Value  . WBC 01/14/2024 9.1   . RBC 01/14/2024 5.99 (H)   . Hemoglobin 01/14/2024 15.4   . HCT 01/14/2024 47.8   . MCV 01/14/2024 79.7   . MCHC 01/14/2024 32.2   . RDW 01/14/2024 17.8 (H)   . Platelets 01/14/2024 220.0   . Neutrophils Relative % 01/14/2024 74.3   . Lymphocytes Relative 01/14/2024 18.1   . Monocytes Relative 01/14/2024 4.8   . Eosinophils Relative 01/14/2024 1.9   . Basophils Relative 01/14/2024 0.9   . Neutro Abs 01/14/2024 6.7   . Lymphs Abs 01/14/2024 1.6   . Monocytes Absolute 01/14/2024 0.4   . Eosinophils Absolute 01/14/2024 0.2   . Basophils Absolute 01/14/2024 0.1   . Sodium 01/14/2024 140   . Potassium 01/14/2024 4.3   . Chloride 01/14/2024 103   . CO2 01/14/2024 28   . Glucose, Bld 01/14/2024 89   . BUN 01/14/2024 14   . Creatinine, Ser 01/14/2024 1.33   . Total Bilirubin 01/14/2024 0.5   . Alkaline Phosphatase 01/14/2024 67   . AST 01/14/2024 16   . ALT 01/14/2024 15   . Total Protein 01/14/2024 7.1   . Albumin 01/14/2024 4.1   . GFR 01/14/2024 57.84 (L)   . Calcium  01/14/2024 9.3   . Cholesterol 01/14/2024 125   . Triglycerides 01/14/2024 93.0   . HDL 01/14/2024 40.10   . VLDL 01/14/2024 18.6   . LDL Cholesterol 01/14/2024 66   . Total CHOL/HDL Ratio 01/14/2024 3   . NonHDL 01/14/2024 84.77   . Hgb A1c MFr Bld 01/14/2024 6.3   . Microalb, Ur 01/14/2024 9.9 (H)   . Creatinine,U 01/14/2024 159.1   . Microalb Creat Ratio 01/14/2024 6.2   . Interpretation - HGBRFX 01/14/2024 see note   . Reviewed By: 01/14/2024 see note   . Hgb A 01/14/2024 97.5   . Fetal Hemoglobin Testing 01/14/2024 0.0    . Hemoglobin A2 - HGBRFX 01/14/2024 2.5   . C-Z Electrophoresis 01/14/2024 Confirms   . RBC 01/14/2024 6.15 (H)   . Hemoglobin 01/14/2024 15.5   . HCT 01/14/2024 50.2 (H)   . MCV 01/14/2024 81.6   . MCH 01/14/2024 25.2 (L)   . MCHC 01/14/2024 30.9 (L)   . RDW 01/14/2024 17.3 (H)   . Ferritin 01/14/2024 55   . VITD 01/14/2024 32.30   . PTH 01/14/2024 35   . PSA, Total 01/14/2024 11.7 (H)   . PSA, Free 01/14/2024 1.0   . PSA, % Free 01/14/2024 NOT CALCULATED   . Magnesium 01/14/2024 1.7   . Uric Acid, Serum  01/14/2024 5.4   . Ferritin 01/14/2024 44.0   . Vitamin B-12 01/14/2024 212   . Summary Report 01/14/2024 see note   . Reviewed By: 01/14/2024 see note   . Results Recieved 01/14/2024 01/25/24   . Alpha-Thalassemia 01/14/2024 see note   Office Visit on 10/05/2023  Component Date Value  . Vit D, 25-Hydroxy 10/05/2023 10 (L)   . Uric Acid, Serum 10/05/2023 5.4   . PTH 10/05/2023 13 (L)   . Phosphorus 10/05/2023 3.2   . Magnesium 10/05/2023 1.9   . Glucose, Bld 10/05/2023 85   . BUN 10/05/2023 14   . Creat 10/05/2023 1.22   . BUN/Creatinine Ratio 10/05/2023 SEE NOTE:   . Sodium 10/05/2023 141   . Potassium 10/05/2023 4.6   . Chloride 10/05/2023 102   . CO2 10/05/2023 28   . Calcium  10/05/2023 9.8   Office Visit on 09/07/2023  Component Date Value  . WBC 09/07/2023 9.5   . RBC 09/07/2023 6.28 (H)   . Hemoglobin 09/07/2023 15.6   . HCT 09/07/2023 49.5   . MCV 09/07/2023 78.8 (L)   . [facility] 09/07/2023 24.8 (L)   . MCHC 09/07/2023 31.5 (L)   . RDW 09/07/2023 14.8   . Platelets 09/07/2023 270   . MPV 09/07/2023 10.8   . Neutro Abs 09/07/2023 6,603   . Lymphs Abs 09/07/2023 1,957   . Absolute Monocytes 09/07/2023 751   . Eosinophils Absolute 09/07/2023 133   . Basophils Absolute 09/07/2023 57   . Neutrophils Relative % 09/07/2023 69.5   . Total Lymphocyte 09/07/2023 20.6   . Monocytes Relative 09/07/2023 7.9   . Eosinophils Relative 09/07/2023 1.4   . Basophils  Relative 09/07/2023 0.6   . Glucose, Bld 09/07/2023 78   . BUN 09/07/2023 14   . Creat 09/07/2023 1.24   . BUN/Creatinine Ratio 09/07/2023 SEE NOTE:   . Sodium 09/07/2023 140   . Potassium 09/07/2023 4.0   . Chloride 09/07/2023 106   . CO2 09/07/2023 22   . Calcium  09/07/2023 9.2   . Total Protein 09/07/2023 6.7   . Albumin 09/07/2023 4.1   . Globulin 09/07/2023 2.6   . AG Ratio 09/07/2023 1.6   . Total Bilirubin 09/07/2023 0.4   . Alkaline phosphatase (AP* 09/07/2023 75   . AST 09/07/2023 21   . ALT 09/07/2023 24   . Cholesterol 09/07/2023 113   . HDL 09/07/2023 37 (L)   . Triglycerides 09/07/2023 73   . LDL Cholesterol (Calc) 09/07/2023 61   . Total CHOL/HDL Ratio 09/07/2023 3.1   . Non-HDL Cholesterol (Cal* 09/07/2023 76   . Hgb A1c MFr Bld 09/07/2023 6.7 (H)   . Mean Plasma Glucose 09/07/2023 146   . eAG (mmol/L) 09/07/2023 8.1   . Creatinine, Urine 09/07/2023 218   . Microalb, Ur 09/07/2023 8.5   . Microalb Creat Ratio 09/07/2023 39 (H)   Office Visit on 05/08/2023  Component Date Value  . PSA 05/08/2023 11.53 (H)   . Sodium 05/08/2023 139   . Potassium 05/08/2023 3.9   . Chloride 05/08/2023 105   . CO2 05/08/2023 24   . Glucose, Bld 05/08/2023 140 (H)   . BUN 05/08/2023 13   . Creatinine, Ser 05/08/2023 1.12   . GFR 05/08/2023 71.43   . Calcium  05/08/2023 9.0   . WBC 05/08/2023 7.9   . RBC 05/08/2023 5.90 (H)   . Hemoglobin 05/08/2023 14.5   . HCT 05/08/2023 45.5   . MCV 05/08/2023 77.1 (L)   .  MCHC 05/08/2023 31.8   . RDW 05/08/2023 18.3 (H)   . Platelets 05/08/2023 196.0   . Neutrophils Relative % 05/08/2023 73.8   . Lymphocytes Relative 05/08/2023 18.4   . Monocytes Relative 05/08/2023 5.3   . Eosinophils Relative 05/08/2023 2.0   . Basophils Relative 05/08/2023 0.5   . Neutro Abs 05/08/2023 5.8   . Lymphs Abs 05/08/2023 1.4   . Monocytes Absolute 05/08/2023 0.4   . Eosinophils Absolute 05/08/2023 0.2   . Basophils Absolute 05/08/2023 0.0   .  Ferritin 05/08/2023 38.7   No results displayed because visit has over 200 results.    No image results found. No results found.      01/14/2024    2:16 PM 11/14/2023    8:53 AM 10/05/2023    1:43 PM 09/07/2023    2:04 PM  PHQ 2/9 Scores  PHQ - 2 Score 0 0 0 0   Results LABS PSA: 11.5 (2024) PSA: 8.9 (2023) HbA1c: 6.3% (February 2025)    Assessment & Plan Elevated PSA  Stage 3a chronic kidney disease (HCC)  Chronic diastolic heart failure (HCC)  Hypertension due to endocrine disorder  Type 2 diabetes mellitus with other specified complication, without long-term current use of insulin  (HCC)  Material hardship due to limited financial resources  Microcytosis   Assessment and Plan Assessment & Plan Diabetes Mellitus   Diabetes is managed with Mounjaro  and another unspecified medication. Recent A1c was 6.3, indicating good control, but there is concern about potential worsening due to a recent lapse in Mounjaro  use. Recheck A1c and ensure regular use of Mounjaro .  Obesity   Obesity poses a significant health risk, with weight fluctuating between 416 and 369 pounds. Mounjaro  aids weight loss. Emphasis on weight loss is crucial to prevent myocardial infarction and other serious issues. Ensure regular use of Mounjaro , encourage exercise, and avoidance of trigger foods.  Hypertension   Blood pressure is elevated. No recent ophthalmologic examination has been conducted. Refer to ophthalmology for evaluation.  Heart Failure   Heart failure is well-managed. He sleeps on his side and does not experience orthopnea.  Chronic Kidney Disease   Chronic kidney disease is present but considered the least of current problems. Blood work is planned to monitor kidney function. Order blood work to check protein and creatinine levels.  Elevated PSA   PSA levels have been elevated, with levels of 8.9 in 2023 and 11.5 in 2024. There is a concern for prostate cancer, and a biopsy is  recommended. Prostate cancer, if present, is likely indolent and not life-threatening in the next ten years, but early diagnosis and treatment are crucial to prevent metastasis. Urgent referral to urology for evaluation and possible prostate biopsy. Order fresh PSA test.  Possible Cushing's Disease   Suspicion of Cushing's disease due to high morning cortisol levels and its potential link to diabetes and cortisol problems. Consider referral to an endocrinologist.  Sleep Apnea   Sleep apnea is managed with CPAP therapy.  Vitamin D  Deficiency   Vitamin D  deficiency is treated with weekly supplementation. Recheck vitamin D  levels.  Recording duration: 19 minutes         Orders Placed During this Encounter:   Orders Placed This Encounter  Procedures  . Comp Met (CMET)  . Vitamin D  (25 hydroxy)  . Protein / creatinine ratio, urine  . PSA  . HgB A1c  . CBC with Differential/Platelet  . Ambulatory referral to Urology    Referral Priority:   Urgent  Referral Type:   Consultation    Referral Reason:   Specialty Services Required    Requested Specialty:   Urology    Number of Visits Requested:   1  . Ambulatory referral to Ophthalmology    Referral Priority:   Routine    Referral Type:   Consultation    Referral Reason:   Specialty Services Required    Requested Specialty:   Ophthalmology    Number of Visits Requested:   1   No orders of the defined types were placed in this encounter.  ED Discharge Orders          Ordered    Ambulatory referral to Urology       Comments: Lab Results      Component                Value               Date                      PSA1                     8.9 (H)             12/22/2021                PSA                      11.53 (H)           05/08/2023                PSA                      2.2                 07/06/2014   04/14/24 1022    Comp Met (CMET)        04/14/24 1022    Vitamin D  (25 hydroxy)        04/14/24 1022    Protein /  creatinine ratio, urine        04/14/24 1022    PSA        04/14/24 1022    HgB A1c        04/14/24 1022    CBC with Differential/Platelet        04/14/24 1022    Ambulatory referral to Ophthalmology        04/14/24 1022              This document was synthesized by artificial intelligence ([employer]bridge) using HIPAA-compliant recording of the clinical interaction;   We discussed the use of AI scribe software for clinical note transcription with the patient, who gave verbal consent to proceed. additional Info: This encounter employed state-of-the-art, real-time, collaborative documentation. The patient actively reviewed and assisted in updating their electronic medical record on a shared screen, ensuring transparency and facilitating joint problem-solving for the problem list, overview, and plan. This approach promotes accurate, informed care. The treatment plan was discussed and reviewed in detail, including medication safety, potential side effects, and all patient questions. We confirmed understanding and comfort with the plan. Follow-up instructions were established, including contacting the office for any concerns, returning if symptoms worsen, persist, or new symptoms develop, and precautions for potential emergency department visits.

## 2024-04-15 LAB — PROTEIN / CREATININE RATIO, URINE
Creatinine, Urine: 156 mg/dL (ref 20–320)
Protein/Creat Ratio: 545 mg/g{creat} — ABNORMAL HIGH (ref 25–148)
Protein/Creatinine Ratio: 0.545 mg/mg{creat} — ABNORMAL HIGH (ref 0.025–0.148)
Total Protein, Urine: 85 mg/dL — ABNORMAL HIGH (ref 5–25)

## 2024-04-15 NOTE — Assessment & Plan Note (Signed)
 Following with cardiologist's

## 2024-04-15 NOTE — Assessment & Plan Note (Signed)
 Diabetes is managed with Mounjaro  and another unspecified medication. Recent A1c was 6.3, indicating good control, but there is concern about potential worsening due to a recent lapse in Mounjaro  use. Recheck A1c and ensure regular use of Mounjaro . No recent ophthalmologic examination has been conducted. Refer to ophthalmology for evaluation.

## 2024-04-15 NOTE — Assessment & Plan Note (Signed)
 Obesity poses a significant health risk, with weight fluctuating between 416 and 369 pounds. Mounjaro  aids weight loss. Emphasis on weight loss is crucial to prevent myocardial infarction and other serious issues. Ensure regular use of Mounjaro , encourage exercise, and avoidance of trigger foods.

## 2024-04-15 NOTE — Assessment & Plan Note (Signed)
 Vitamin D  deficiency is treated with weekly supplementation. Recheck vitamin D  levels.

## 2024-04-15 NOTE — Assessment & Plan Note (Signed)
 PSA levels have been elevated, with levels of 8.9 in 2023 and 11.5 in 2024. There is a concern for possibility of prostate cancer, and a biopsy is recommended. Prostate cancer, if present, is likely indolent and not life-threatening in the next ten years, but early diagnosis and treatment are crucial to prevent metastasis. Urgent referral to urology for evaluation and possible prostate biopsy. Order fresh PSA test.

## 2024-04-15 NOTE — Assessment & Plan Note (Signed)
 Chronic kidney disease is present but considered the least of current problems. Blood work is planned to monitor kidney function. Order blood work to check protein and creatinine levels.

## 2024-04-15 NOTE — Assessment & Plan Note (Signed)
 Blood pressure is elevated, despite being on 5 medication(s) and it being difficult to increase further - suspect(s) obstructive sleep apnea related continues on, as of 04/15/2024 Current hypertension medications:       Sig   amLODipine  (NORVASC ) 5 MG tablet (Taking) Take 1 tablet (5 mg total) by mouth daily. Reduced dose due to leg swelling.   furosemide  (LASIX ) 40 MG tablet (Taking) Take 2 tablets (80 mg total) by mouth daily. Must take with spironolactone  to keep potassium up Reduce dose to just 40 mg daily if no fluid is accumulating.   olmesartan  (BENICAR ) 40 MG tablet (Taking) Take 1 tablet (40 mg total) by mouth daily.   spironolactone  (ALDACTONE ) 25 MG tablet (Taking) Take 1 tablet (25 mg total) by mouth daily.   bisoprolol  (ZEBETA ) 10 MG tablet Take 2 tablets (20 mg total) by mouth daily.     Refer to hypertension clinic for advanced management.

## 2024-04-15 NOTE — Patient Instructions (Signed)
 VISIT SUMMARY:  Today, you came in for a follow-up on multiple health issues, including elevated PSA, diabetes, obesity, hypertension, heart failure, chronic kidney disease, and sleep apnea. We discussed your current medications, recent health changes, and necessary follow-up actions.  YOUR PLAN:  -DIABETES MELLITUS: Diabetes is a condition where your blood sugar levels are too high. Your diabetes is managed with Mounjaro  and another medication. Your recent A1c was 6.3, which is good, but we need to recheck it due to your recent lapse in Mounjaro  use. Please ensure you take Mounjaro  regularly.  -OBESITY: Obesity means having an excessive amount of body fat, which poses significant health risks. Your weight has fluctuated between 416 and 369 pounds. Mounjaro  helps with weight loss. It's important to continue using Mounjaro , exercise regularly, and avoid trigger foods to prevent serious health issues.  -HYPERTENSION: Hypertension is high blood pressure. You need to have an eye examination because high blood pressure can affect your eyes. We will refer you to an ophthalmologist for evaluation.  -HEART FAILURE: Heart failure means your heart doesn't pump blood as well as it should. Your condition is well-managed, and you do not experience worsening symptoms. Continue your current management plan.  -CHRONIC KIDNEY DISEASE: Chronic kidney disease means your kidneys are damaged and can't filter blood as well as they should. We will order blood work to check your kidney function, including protein and creatinine levels.  -ELEVATED PSA: Elevated PSA levels can be a sign of prostate cancer. Your PSA levels have been high, and we recommend a prostate biopsy to check for cancer. We will refer you to a urologist and order a new PSA test.  -POSSIBLE CUSHING'S DISEASE: Cushing's disease is a condition where your body makes too much cortisol. This can affect your diabetes and other health issues. We may refer you  to an endocrinologist for further evaluation.  -SLEEP APNEA: Sleep apnea is a condition where you stop breathing briefly during sleep. You are managing this with a CPAP machine. Continue using your CPAP machine as directed.  -VITAMIN D  DEFICIENCY: Vitamin D  deficiency means you don't have enough vitamin D , which is important for bone health. You are taking a weekly supplement. We will recheck your vitamin D  levels.  INSTRUCTIONS:  Please follow up with the urologist for your elevated PSA and potential prostate biopsy. Schedule an appointment with an ophthalmologist for an eye examination due to your hypertension. We will recheck your A1c and vitamin D  levels, and order blood work to monitor your kidney function. Consider seeing an endocrinologist for possible Cushing's disease.

## 2024-04-16 ENCOUNTER — Encounter: Payer: Self-pay | Admitting: Internal Medicine

## 2024-04-16 NOTE — Progress Notes (Signed)
 Vitamin D  too low, be sure to take 5000 units daily Kidney issues with traces of ongoing damage (protein in urine)  are persisting despite the good diabetes control. We should have follow up visit to talk about it in next month or so. I've put referral to advanced hypertension clinic for the blood pressure which I think is causing the kidney damage.  We can also increase the jardiance  from 10 mg to 25 mg to protect the kidneys, 90 tabs and 3 refills if open to it.

## 2024-04-17 ENCOUNTER — Other Ambulatory Visit: Payer: Self-pay

## 2024-04-17 ENCOUNTER — Telehealth: Payer: Self-pay

## 2024-04-17 MED ORDER — EMPAGLIFLOZIN 25 MG PO TABS: 25.0000 mg | ORAL_TABLET | Freq: Every day | ORAL | 3 refills | Status: DC

## 2024-04-17 NOTE — Telephone Encounter (Signed)
 Spoke to agent at [employer] they will refax over papers that are needing completed at this time.  Copied from CRM #[id number]. Topic: General - Other >> Apr 17, 2024  2:08 PM Clydene Darner H wrote: Reason for CRM: Sherrlyn Dolores from [employer] called stating she been sending over request several times. She informed by the patient that he received a medical claim for medical records and authorization. She's requesting a callback to clarify and determine the next steps.   Callback Number: [phone]

## 2024-04-22 ENCOUNTER — Telehealth: Payer: Self-pay

## 2024-04-22 NOTE — Telephone Encounter (Signed)
 Called pt spoke to pt by phone to have Virtual visit or office visit for Disability papers to be filled out per pcp. Pt states he will call back to get appt.

## 2024-04-23 ENCOUNTER — Telehealth: Payer: Self-pay

## 2024-04-23 NOTE — Telephone Encounter (Signed)
 Review and adivse please Copied from CRM [id number]. Topic: Appointments - Appointment Scheduling >> Apr 23, 2024  9:38 AM Allyne Areola wrote: Patient/patient representative is calling to schedule an appointment. Refer to attachments for appointment information. Patient was told to schedule either an in person or virtual appointment with Dr.Morrison for disability paper work. Next available date is 05/09/2024 but patient would like a virtual appointment sooner than that if possible.

## 2024-05-01 ENCOUNTER — Other Ambulatory Visit: Payer: Self-pay | Admitting: Internal Medicine

## 2024-05-01 ENCOUNTER — Encounter (HOSPITAL_BASED_OUTPATIENT_CLINIC_OR_DEPARTMENT_OTHER): Payer: Self-pay

## 2024-05-01 DIAGNOSIS — R600 Localized edema: Secondary | ICD-10-CM

## 2024-05-09 ENCOUNTER — Encounter: Payer: Self-pay | Admitting: Internal Medicine

## 2024-05-09 ENCOUNTER — Ambulatory Visit (INDEPENDENT_AMBULATORY_CARE_PROVIDER_SITE_OTHER): Admitting: Internal Medicine

## 2024-05-09 VITALS — BP 160/90 | HR 79 | Temp 98.0°F | Ht 71.0 in | Wt 363.0 lb

## 2024-05-09 DIAGNOSIS — Z0271 Encounter for disability determination: Secondary | ICD-10-CM | POA: Diagnosis not present

## 2024-05-09 DIAGNOSIS — I5032 Chronic diastolic (congestive) heart failure: Secondary | ICD-10-CM

## 2024-05-09 DIAGNOSIS — I2584 Coronary atherosclerosis due to calcified coronary lesion: Secondary | ICD-10-CM

## 2024-05-09 DIAGNOSIS — N1831 Chronic kidney disease, stage 3a: Secondary | ICD-10-CM

## 2024-05-09 DIAGNOSIS — H547 Unspecified visual loss: Secondary | ICD-10-CM

## 2024-05-09 DIAGNOSIS — M170 Bilateral primary osteoarthritis of knee: Secondary | ICD-10-CM

## 2024-05-09 DIAGNOSIS — I2510 Atherosclerotic heart disease of native coronary artery without angina pectoris: Secondary | ICD-10-CM

## 2024-05-09 NOTE — Progress Notes (Signed)
 ==============================  [facility]: [phone]   -- Medical Office Visit --  Patient: Jason Meyer      Age: 63 y.o.       Sex:  male  Date:   05/09/2024 Today's Healthcare Provider: Anthon Kins, MD  ==============================   Chief Complaint: Disability paper work Focused visit on paperwork completion- needed special letter for disability, in addition to complete new form for disability. Update on status.  Discussed the use of AI scribe software for clinical note transcription with the patient, who gave verbal consent to proceed.  History of Present Illness 63 year old male who presents for disability paperwork and evaluation of his medical conditions.  He is currently working part-time, 25 hours a week, with restrictions including no prolonged standing and frequent breaks. This schedule started on October 12, 2023. He is seeking to continue his disability benefits and is involved in a complex treatment plan with multiple specialists.  He has a history of congestive heart failure and coronary artery disease and is under the care of a cardiologist. There have been no recent exacerbations or hospitalizations related to these conditions.  Severe arthritis primarily affects his knees, causing significant pain and mobility issues. He has received gel injections in the past, which he tolerated well, but has not been referred for further injections recently.  He has a history of vision problems, which have previously restricted his ability to drive. However, his vision has improved, and he is currently able to read small print and feels safe to drive.  He mentions having kidney disease, although it is not currently considered a disabling condition. He is not on dialysis and is managed by a urologist. He also follows up with an ophthalmologist for vision issues and an orthopedic specialist for arthritis.  Background  Reviewed: Problem List: has Type 2 diabetes mellitus with other specified complication (HCC); Hypertension; Morbid obesity with BMI of 50.0-59.9, adult (HCC); Mixed hyperlipidemia; Venous stasis; OA (osteoarthritis); DOE (dyspnea on exertion); Chronic diastolic heart failure (HCC); Venous stasis dermatitis of both lower extremities; OSA (obstructive sleep apnea); Erectile dysfunction; Low HDL (under 40); Elevated PSA; Coronary artery calcification; Coronary artery disease; On statin therapy; Chronic kidney disease, stage 3 unspecified (HCC); Vitamin D  deficiency; Vision problems; Encounter for disability examination; and Thalassemia carrier on their problem list. Past Medical History:  has a past medical history of Allergy, Arthritis, CAP (community acquired pneumonia) (04/12/2023), Chronic diastolic heart failure (HCC) (09/81/1913), Coronary artery calcification (10/19/2022), Hyperlipidemia, Hypertension, OSA (obstructive sleep apnea) (10/19/2022), Sepsis (HCC) (04/12/2023), Sleep apnea, and Type 2 diabetes mellitus (HCC). Past Surgical History:   has a past surgical history that includes No past surgeries. Social History:   reports that he quit smoking about 16 years ago. His smoking use included cigarettes. He started smoking about 49 years ago. He has a 33 pack-year smoking history. He has never used smokeless tobacco. He reports that he does not currently use alcohol. He reports that he does not use drugs. Family History:  family history includes Alzheimer's disease in his mother; Heart attack in his brother and father; Heart disease in his father; Hypertension in his father; Stroke in his father. Allergies:  is allergic to penicillins.   Medication Reconciliation: Current Outpatient Medications on File Prior to Visit  Medication Sig   acetaminophen  (TYLENOL ) 325 MG tablet Take 2 tablets (650 mg total) by mouth every 6 (six) hours as needed for mild pain (or Fever >/= 101).  albuterol  (PROVENTIL )  (2.5 MG/3ML) 0.083% nebulizer solution Take 3 mLs (2.5 mg total) by nebulization every 6 (six) hours as needed for wheezing or shortness of breath.   albuterol  (VENTOLIN  HFA) 108 (90 Base) MCG/ACT inhaler Inhale 2 puffs into the lungs every 6 (six) hours as needed for wheezing or shortness of breath.   amLODipine  (NORVASC ) 5 MG tablet Take 1 tablet (5 mg total) by mouth daily. Reduced dose due to leg swelling.   ASPIRIN  EC 81 MG EC tablet Take 1 tablet (81 mg total) by mouth daily.   Azelastine  HCl 137 MCG/SPRAY SOLN Place 1 spray into the nose daily.   bisoprolol  (ZEBETA ) 10 MG tablet Take 2 tablets (20 mg total) by mouth daily.   buPROPion  (WELLBUTRIN  XL) 150 MG 24 hr tablet TAKE 1 TABLET BY MOUTH EVERY DAY   celecoxib  (CELEBREX ) 200 MG capsule Take 1 capsule (200 mg total) by mouth 2 (two) times daily. Only take if pain severe. Risk of heart issues.   clotrimazole  (LOTRIMIN ) 1 % cream Apply topically daily.   clotrimazole -betamethasone  (LOTRISONE ) cream APPLY TO AFFECTED AREA TWICE A DAY   empagliflozin  (JARDIANCE ) 25 MG TABS tablet Take 1 tablet (25 mg total) by mouth daily before breakfast.   Evolocumab  (REPATHA  SURECLICK) 140 MG/ML SOAJ Inject 140 mg into the skin every 14 (fourteen) days.   famotidine -calcium  carbonate-magnesium hydroxide (PEPCID  COMPLETE) 10-800-165 MG chewable tablet Chew 1 tablet by mouth 2 (two) times daily as needed.   Finerenone  (KERENDIA ) 20 MG TABS Take 1 tablet (20 mg total) by mouth daily. Replaces spironolactone , start at 10 mg for first 4 weeks (half tablets)   fluticasone  (FLONASE ) 50 MCG/ACT nasal spray Place 2 sprays into both nostrils daily.   fluticasone  furoate-vilanterol (BREO ELLIPTA ) 100-25 MCG/ACT AEPB Inhale 1 puff into the lungs daily.   furosemide  (LASIX ) 40 MG tablet Take 2 tablets (80 mg total) by mouth daily. Must take with spironolactone  to keep potassium up Reduce dose to just 40 mg daily if no fluid is accumulating.   metFORMIN  (GLUCOPHAGE ) 1000  MG tablet Take 1 tablet (1,000 mg total) by mouth 2 (two) times daily with a meal.   olmesartan  (BENICAR ) 40 MG tablet Take 1 tablet (40 mg total) by mouth daily.   potassium chloride  (MICRO-K ) 10 MEQ CR capsule TAKE 1 CAPSULE BY MOUTH EVERY DAY   rosuvastatin  (CRESTOR ) 40 MG tablet Take 1 tablet (40 mg total) by mouth daily. Replaces atorvastatin  which failed to reach Low Density Lipoprotein (LDL cholesterol) goal of 55, take only if repatha  not approved/available.   spironolactone  (ALDACTONE ) 25 MG tablet Take 1 tablet (25 mg total) by mouth daily.   Vitamin D , Ergocalciferol , (DRISDOL ) 1.25 MG (50000 UNIT) CAPS capsule TAKE 1 CAPSULE (50,000 UNITS TOTAL) BY MOUTH EVERY 7 (SEVEN) DAYS. ONCE A WEEK ONLY   No current facility-administered medications on file prior to visit.  There are no discontinued medications.   Physical Exam:    05/09/2024   11:13 AM 05/09/2024   10:32 AM 04/14/2024    9:51 AM  Vitals with BMI  Height  5\' 11"  5\' 11"   Weight  363 lbs 371 lbs 13 oz  BMI  50.65 51.88  Systolic 160 170 811  Diastolic 90 90 88  Pulse  79 74  Vital signs reviewed.  Nursing notes reviewed. Weight trend reviewed. Physical Exam General Appearance:  No acute distress appreciable.   Well-groomed, healthy-appearing male.  Well proportioned with no abnormal fat distribution.  Good muscle tone. Pulmonary:  Normal work of breathing at rest, no respiratory distress apparent. SpO2: 98 %  Musculoskeletal: All extremities are intact.  Neurological:  Awake, alert, oriented, and engaged.  No obvious focal neurological deficits or cognitive impairments.  Sensorium seems unclouded.   Speech is clear and coherent with logical content. Psychiatric:  Appropriate mood, pleasant and cooperative demeanor, thoughtful and engaged during the exam  Vision rechecked, about 40/20 bilaterally now.     No results found for any visits on 05/09/24. Office Visit on 04/14/2024  Component Date Value   Sodium 04/14/2024  139    Potassium 04/14/2024 4.2    Chloride 04/14/2024 105    CO2 04/14/2024 25    Glucose, Bld 04/14/2024 110 (H)    BUN 04/14/2024 13    Creatinine, Ser 04/14/2024 1.38    Total Bilirubin 04/14/2024 0.6    Alkaline Phosphatase 04/14/2024 63    AST 04/14/2024 15    ALT 04/14/2024 15    Total Protein 04/14/2024 6.5    Albumin 04/14/2024 4.0    GFR 04/14/2024 55.24 (L)    Calcium  04/14/2024 9.0    VITD 04/14/2024 23.85 (L)    Creatinine, Urine 04/14/2024 156    Protein/Creat Ratio 04/14/2024 545 (H)    Protein/Creatinine Ratio 04/14/2024 0.545 (H)    Total Protein, Urine 04/14/2024 85 (H)    PSA 04/14/2024 11.46 (H)    Hgb A1c MFr Bld 04/14/2024 6.5    WBC 04/14/2024 8.5    RBC 04/14/2024 6.00 (H)    Hemoglobin 04/14/2024 15.6    HCT 04/14/2024 47.7    MCV 04/14/2024 79.5    MCHC 04/14/2024 32.8    RDW 04/14/2024 16.3 (H)    Platelets 04/14/2024 175.0    Neutrophils Relative % 04/14/2024 72.9    Lymphocytes Relative 04/14/2024 17.2    Monocytes Relative 04/14/2024 6.2    Eosinophils Relative 04/14/2024 3.1    Basophils Relative 04/14/2024 0.6    Neutro Abs 04/14/2024 6.2    Lymphs Abs 04/14/2024 1.5    Monocytes Absolute 04/14/2024 0.5    Eosinophils Absolute 04/14/2024 0.3    Basophils Absolute 04/14/2024 0.1   Office Visit on 01/14/2024  Component Date Value   WBC 01/14/2024 9.1    RBC 01/14/2024 5.99 (H)    Hemoglobin 01/14/2024 15.4    HCT 01/14/2024 47.8    MCV 01/14/2024 79.7    MCHC 01/14/2024 32.2    RDW 01/14/2024 17.8 (H)    Platelets 01/14/2024 220.0    Neutrophils Relative % 01/14/2024 74.3    Lymphocytes Relative 01/14/2024 18.1    Monocytes Relative 01/14/2024 4.8    Eosinophils Relative 01/14/2024 1.9    Basophils Relative 01/14/2024 0.9    Neutro Abs 01/14/2024 6.7    Lymphs Abs 01/14/2024 1.6    Monocytes Absolute 01/14/2024 0.4    Eosinophils Absolute 01/14/2024 0.2    Basophils Absolute 01/14/2024 0.1    Sodium 01/14/2024 140    Potassium  01/14/2024 4.3    Chloride 01/14/2024 103    CO2 01/14/2024 28    Glucose, Bld 01/14/2024 89    BUN 01/14/2024 14    Creatinine, Ser 01/14/2024 1.33    Total Bilirubin 01/14/2024 0.5    Alkaline Phosphatase 01/14/2024 67    AST 01/14/2024 16    ALT 01/14/2024 15    Total Protein 01/14/2024 7.1    Albumin 01/14/2024 4.1    GFR 01/14/2024 57.84 (L)    Calcium  01/14/2024 9.3    Cholesterol 01/14/2024 125  Triglycerides 01/14/2024 93.0    HDL 01/14/2024 40.10    VLDL 01/14/2024 18.6    LDL Cholesterol 01/14/2024 66    Total CHOL/HDL Ratio 01/14/2024 3    NonHDL 01/14/2024 84.77    Hgb A1c MFr Bld 01/14/2024 6.3    Microalb, Ur 01/14/2024 9.9 (H)    Creatinine,U 01/14/2024 159.1    Microalb Creat Ratio 01/14/2024 6.2    Interpretation - HGBRFX 01/14/2024 see note    Reviewed By: 01/14/2024 see note    Hgb A 01/14/2024 97.5    Fetal Hemoglobin Testing 01/14/2024 0.0    Hemoglobin A2 - HGBRFX 01/14/2024 2.5    C-Z Electrophoresis 01/14/2024 Confirms    RBC 01/14/2024 6.15 (H)    Hemoglobin 01/14/2024 15.5    HCT 01/14/2024 50.2 (H)    MCV 01/14/2024 81.6    MCH 01/14/2024 25.2 (L)    MCHC 01/14/2024 30.9 (L)    RDW 01/14/2024 17.3 (H)    Ferritin 01/14/2024 55    VITD 01/14/2024 32.30    PTH 01/14/2024 35    PSA, Total 01/14/2024 11.7 (H)    PSA, Free 01/14/2024 1.0    PSA, % Free 01/14/2024 NOT CALCULATED    Magnesium 01/14/2024 1.7    Uric Acid, Serum 01/14/2024 5.4    Ferritin 01/14/2024 44.0    Vitamin B-12 01/14/2024 212    Summary Report 01/14/2024 see note    Reviewed By: 01/14/2024 see note    Results Recieved 01/14/2024 01/25/24    Alpha-Thalassemia 01/14/2024 see note   Office Visit on 10/05/2023  Component Date Value   Vit D, 25-Hydroxy 10/05/2023 10 (L)    Uric Acid, Serum 10/05/2023 5.4    PTH 10/05/2023 13 (L)    Phosphorus 10/05/2023 3.2    Magnesium 10/05/2023 1.9    Glucose, Bld 10/05/2023 85    BUN 10/05/2023 14    Creat 10/05/2023 1.22     BUN/Creatinine Ratio 10/05/2023 SEE NOTE:    Sodium 10/05/2023 141    Potassium 10/05/2023 4.6    Chloride 10/05/2023 102    CO2 10/05/2023 28    Calcium  10/05/2023 9.8   Office Visit on 09/07/2023  Component Date Value   WBC 09/07/2023 9.5    RBC 09/07/2023 6.28 (H)    Hemoglobin 09/07/2023 15.6    HCT 09/07/2023 49.5    MCV 09/07/2023 78.8 (L)    MCH 09/07/2023 24.8 (L)    MCHC 09/07/2023 31.5 (L)    RDW 09/07/2023 14.8    Platelets 09/07/2023 270    MPV 09/07/2023 10.8    Neutro Abs 09/07/2023 6,603    Lymphs Abs 09/07/2023 1,957    Absolute Monocytes 09/07/2023 751    Eosinophils Absolute 09/07/2023 133    Basophils Absolute 09/07/2023 57    Neutrophils Relative % 09/07/2023 69.5    Total Lymphocyte 09/07/2023 20.6    Monocytes Relative 09/07/2023 7.9    Eosinophils Relative 09/07/2023 1.4    Basophils Relative 09/07/2023 0.6    Glucose, Bld 09/07/2023 78    BUN 09/07/2023 14    Creat 09/07/2023 1.24    BUN/Creatinine Ratio 09/07/2023 SEE NOTE:    Sodium 09/07/2023 140    Potassium 09/07/2023 4.0    Chloride 09/07/2023 106    CO2 09/07/2023 22    Calcium  09/07/2023 9.2    Total Protein 09/07/2023 6.7    Albumin 09/07/2023 4.1    Globulin 09/07/2023 2.6    AG Ratio 09/07/2023 1.6    Total Bilirubin 09/07/2023 0.4  Alkaline phosphatase (AP* 09/07/2023 75    AST 09/07/2023 21    ALT 09/07/2023 24    Cholesterol 09/07/2023 113    HDL 09/07/2023 37 (L)    Triglycerides 09/07/2023 73    LDL Cholesterol (Calc) 09/07/2023 61    Total CHOL/HDL Ratio 09/07/2023 3.1    Non-HDL Cholesterol (Cal* 09/07/2023 76    Hgb A1c MFr Bld 09/07/2023 6.7 (H)    Mean Plasma Glucose 09/07/2023 146    eAG (mmol/L) 09/07/2023 8.1    Creatinine, Urine 09/07/2023 218    Microalb, Ur 09/07/2023 8.5    Microalb Creat Ratio 09/07/2023 39 (H)   No image results found. No results found.      05/09/2024   10:37 AM 01/14/2024    2:16 PM 11/14/2023    8:53 AM 10/05/2023    1:43 PM  PHQ  2/9 Scores  PHQ - 2 Score 0 0 0 0  PHQ- 9 Score 0         Assessment & Plan Chronic diastolic heart failure (HCC) This chronic condition requires ongoing management with a complex treatment plan involving multiple specialists. He is currently on part-time work restrictions. Weight loss may improve mobility and work capability, but deterioration is expected over time. Continue the current treatment plan with cardiology involvement. Fax medical documentation to [employer] for the disability claim. Recommend pursuing total permanent disability due to expected deterioration. Encounter for disability examination Completed paperwork today see scanned media. Coronary artery disease due to calcified coronary lesion This chronic condition contributes to heart failure and is managed as part of the overall cardiac care plan. Vision problems His vision has improved, allowing for safe driving. Previous restrictions on driving due to poor vision have been lifted after an informal vision test in the office. Improvement is attributed to better blood sugar control. Allow driving based on improved vision. Recommend follow-up with ophthalmology to confirm vision status. Osteoarthritis of both knees, unspecified osteoarthritis type Severe arthritis primarily affects his knees. Previous treatment with injections was not continued. He is not a candidate for knee replacement but is open to further injections. Consider referral to orthopedics for further evaluation and potential injections. Stage 3a chronic kidney disease (HCC) This chronic condition is not currently considered disabling unless dialysis is required and is managed as part of the overall health plan.  See scanned copy of paperwork completed today  I have personally spent 30 minutes involved in face-to-face and non-face-to-face activities for this patient on the day of the visit. Professional time spent includes the following activities: mostly working  on extensive paperwork with patient for disability, and coordinating care for that.    This document was synthesized by artificial intelligence ([employer]) using HIPAA-compliant recording of the clinical interaction;   We discussed the use of AI scribe software for clinical note transcription with the patient, who gave verbal consent to proceed. additional Info: This encounter employed state-of-the-art, real-time, collaborative documentation. The patient actively reviewed and assisted in updating their electronic medical record on a shared screen, ensuring transparency and facilitating joint problem-solving for the problem list, overview, and plan. This approach promotes accurate, informed care. The treatment plan was discussed and reviewed in detail, including medication safety, potential side effects, and all patient questions. We confirmed understanding and comfort with the plan. Follow-up instructions were established, including contacting the office for any concerns, returning if symptoms worsen, persist, or new symptoms develop, and precautions for potential emergency department visits.

## 2024-05-09 NOTE — Patient Instructions (Addendum)
  VISIT SUMMARY:  Today, we discussed your ongoing medical conditions and completed the necessary paperwork for your disability benefits. You are currently working part-time with specific restrictions and are under the care of multiple specialists for your various health issues. We reviewed your conditions, including congestive heart failure, coronary artery disease, chronic kidney disease, severe arthritis, and vision problems.  YOUR PLAN:  -CONGESTIVE HEART FAILURE: Congestive heart failure is a condition where the heart does not pump blood as well as it should. You should continue with your current treatment plan and follow up with your cardiologist. We will fax the necessary medical documentation to [employer] for your disability claim and recommend pursuing total permanent disability due to expected deterioration.  -CORONARY ARTERY DISEASE: Coronary artery disease is a condition where the blood vessels that supply blood to the heart are narrowed or blocked. This is being managed as part of your overall cardiac care plan.  -CHRONIC KIDNEY DISEASE: Chronic kidney disease is a condition where the kidneys are damaged and cannot filter blood as well as they should. This is not currently considered disabling and is being managed as part of your overall health plan.  -SEVERE ARTHRITIS, MAINLY IN KNEES: Severe arthritis is causing significant pain and mobility issues in your knees. You have previously received gel injections, and we will consider referring you to orthopedics for further evaluation and potential injections.  -VISION PROBLEMS: Your vision has improved, allowing you to drive safely again. This improvement is likely due to better blood sugar control. You should follow up with ophthalmology to confirm your vision status.  INSTRUCTIONS:  Please continue to follow up with your cardiologist, urologist, ophthalmologist, and orthopedic specialist as scheduled. We will fax the necessary medical  documentation to [employer] for your disability claim. Consider pursuing total permanent disability due to the expected deterioration of your heart condition.

## 2024-05-10 ENCOUNTER — Encounter: Payer: Self-pay | Admitting: Internal Medicine

## 2024-05-10 ENCOUNTER — Other Ambulatory Visit: Payer: Self-pay | Admitting: Internal Medicine

## 2024-05-10 DIAGNOSIS — E1169 Type 2 diabetes mellitus with other specified complication: Secondary | ICD-10-CM

## 2024-05-10 NOTE — Assessment & Plan Note (Signed)
 Completed paperwork today see scanned media.

## 2024-05-10 NOTE — Assessment & Plan Note (Signed)
 This chronic condition contributes to heart failure and is managed as part of the overall cardiac care plan.

## 2024-05-10 NOTE — Assessment & Plan Note (Signed)
 This chronic condition requires ongoing management with a complex treatment plan involving multiple specialists. He is currently on part-time work restrictions. Weight loss may improve mobility and work capability, but deterioration is expected over time. Continue the current treatment plan with cardiology involvement. Fax medical documentation to [employer] for the disability claim. Recommend pursuing total permanent disability due to expected deterioration.

## 2024-05-10 NOTE — Assessment & Plan Note (Signed)
 Severe arthritis primarily affects his knees. Previous treatment with injections was not continued. He is not a candidate for knee replacement but is open to further injections. Consider referral to orthopedics for further evaluation and potential injections.

## 2024-05-10 NOTE — Assessment & Plan Note (Signed)
 His vision has improved, allowing for safe driving. Previous restrictions on driving due to poor vision have been lifted after an informal vision test in the office. Improvement is attributed to better blood sugar control. Allow driving based on improved vision. Recommend follow-up with ophthalmology to confirm vision status.

## 2024-05-10 NOTE — Assessment & Plan Note (Signed)
 This chronic condition is not currently considered disabling unless dialysis is required and is managed as part of the overall health plan.

## 2024-05-15 ENCOUNTER — Ambulatory Visit: Admitting: Internal Medicine

## 2024-05-15 ENCOUNTER — Encounter: Payer: Self-pay | Admitting: Internal Medicine

## 2024-05-15 VITALS — BP 140/80 | HR 77 | Temp 98.0°F | Ht 71.0 in | Wt 361.4 lb

## 2024-05-15 DIAGNOSIS — M170 Bilateral primary osteoarthritis of knee: Secondary | ICD-10-CM

## 2024-05-15 DIAGNOSIS — I2510 Atherosclerotic heart disease of native coronary artery without angina pectoris: Secondary | ICD-10-CM | POA: Diagnosis not present

## 2024-05-15 DIAGNOSIS — I152 Hypertension secondary to endocrine disorders: Secondary | ICD-10-CM

## 2024-05-15 DIAGNOSIS — E1121 Type 2 diabetes mellitus with diabetic nephropathy: Secondary | ICD-10-CM | POA: Insufficient documentation

## 2024-05-15 DIAGNOSIS — R972 Elevated prostate specific antigen [PSA]: Secondary | ICD-10-CM | POA: Diagnosis not present

## 2024-05-15 DIAGNOSIS — E1169 Type 2 diabetes mellitus with other specified complication: Secondary | ICD-10-CM

## 2024-05-15 DIAGNOSIS — Z7985 Long-term (current) use of injectable non-insulin antidiabetic drugs: Secondary | ICD-10-CM

## 2024-05-15 DIAGNOSIS — Z1211 Encounter for screening for malignant neoplasm of colon: Secondary | ICD-10-CM

## 2024-05-15 DIAGNOSIS — N1831 Chronic kidney disease, stage 3a: Secondary | ICD-10-CM

## 2024-05-15 MED ORDER — AMLODIPINE-OLMESARTAN 10-40 MG PO TABS: 1.0000 | ORAL_TABLET | Freq: Every day | ORAL | 3 refills | Status: DC

## 2024-05-15 MED ORDER — TIRZEPATIDE 15 MG/0.5ML ~~LOC~~ SOAJ: 15.0000 mg | SUBCUTANEOUS | 3 refills | Status: AC

## 2024-05-15 NOTE — Progress Notes (Signed)
 ==============================  [facility]: [phone]   -- Medical Office Visit --  Patient: Jason Meyer      Age: 63 y.o.       Sex:  male  Date:   05/15/2024 Today's Healthcare Provider: Anthon Kins, MD  ==============================   Chief Complaint: Diabetes and Elevated PSA  Discussed the use of AI scribe software for clinical note transcription with the patient, who gave verbal consent to proceed.  History of Present Illness  63 year old male with diabetes and hypertension who presents for management of his chronic conditions.  He is managing his diabetes with Mounjaro  at a dose of 12.5 mg. His last A1c was 6.5%. He has not had a diabetic eye exam in several years but has an upcoming appointment in August, though he is unsure which specialist it is with.  He has hypertension with recent blood pressure readings of 140/80 mmHg and previously 150-160 mmHg, which he attributes to stress related to paperwork. He is currently taking amlodipine  5 mg and olmesartan .  He has a history of high PSA levels, with a recent reading over 10, and has been referred to a urologist for further evaluation, including a prostate biopsy. He is concerned about potential prostate cancer and the implications of treatment.  He has chronic kidney disease and uses Tylenol  instead of ibuprofen for knee pain. He is taking vitamin D  supplements daily.  He has not undergone colon cancer screening but is open to using Cologuard for non-invasive testing. He has knee pain from previous sports activity and is considering a referral to an orthopedist for further management.  He is taking cholesterol medication and aspirin  for cardiovascular protection.  No back pain. He has knee pain and acknowledges the need for weight loss.   Health Maintenance Due  Topic Date Due   Colonoscopy  Never done   FOOT EXAM  09/15/2021   OPHTHALMOLOGY EXAM  09/24/2021  Foot exam  was done but not documented and he defer repeat Lab Results  Component Value Date   PSA1 8.9 (H) 12/22/2021   PSA 11.46 (H) 04/14/2024   PSA 11.53 (H) 05/08/2023   PSA 2.2 07/06/2014      Background Reviewed: Problem List: has Type 2 diabetes mellitus with other specified complication (HCC); Hypertension; Morbid obesity with BMI of 50.0-59.9, adult (HCC); Mixed hyperlipidemia; Venous stasis; OA (osteoarthritis); DOE (dyspnea on exertion); Chronic diastolic heart failure (HCC); Venous stasis dermatitis of both lower extremities; OSA (obstructive sleep apnea); Erectile dysfunction; Low HDL (under 40); Elevated PSA; Coronary artery calcification; Coronary artery disease; On statin therapy; Chronic kidney disease, stage 3 unspecified (HCC); Vitamin D  deficiency; Vision problems; Encounter for disability examination; Thalassemia carrier; and Diabetic nephropathy with proteinuria (HCC) on their problem list. Past Medical History:  has a past medical history of Allergy, Arthritis, CAP (community acquired pneumonia) (04/12/2023), Chronic diastolic heart failure (HCC) (84/16/6063), Coronary artery calcification (10/19/2022), Hyperlipidemia, Hypertension, OSA (obstructive sleep apnea) (10/19/2022), Sepsis (HCC) (04/12/2023), Sleep apnea, and Type 2 diabetes mellitus (HCC). Past Surgical History:   has a past surgical history that includes No past surgeries. Social History:   reports that he quit smoking about 16 years ago. His smoking use included cigarettes. He started smoking about 49 years ago. He has a 33 pack-year smoking history. He has never used smokeless tobacco. He reports that he does not currently use alcohol. He reports that he does not use drugs. Family History:  family history includes Alzheimer's disease  in his mother; Heart attack in his brother and father; Heart disease in his father; Hypertension in his father; Stroke in his father. Allergies:  is allergic to penicillins.   Medication  Reconciliation: Current Outpatient Medications on File Prior to Visit  Medication Sig   acetaminophen  (TYLENOL ) 325 MG tablet Take 2 tablets (650 mg total) by mouth every 6 (six) hours as needed for mild pain (or Fever >/= 101).   albuterol  (PROVENTIL ) (2.5 MG/3ML) 0.083% nebulizer solution Take 3 mLs (2.5 mg total) by nebulization every 6 (six) hours as needed for wheezing or shortness of breath.   albuterol  (VENTOLIN  HFA) 108 (90 Base) MCG/ACT inhaler Inhale 2 puffs into the lungs every 6 (six) hours as needed for wheezing or shortness of breath.   amLODipine  (NORVASC ) 5 MG tablet Take 1 tablet (5 mg total) by mouth daily. Reduced dose due to leg swelling.   ASPIRIN  EC 81 MG EC tablet Take 1 tablet (81 mg total) by mouth daily.   Azelastine  HCl 137 MCG/SPRAY SOLN Place 1 spray into the nose daily.   bisoprolol  (ZEBETA ) 10 MG tablet Take 2 tablets (20 mg total) by mouth daily.   buPROPion  (WELLBUTRIN  XL) 150 MG 24 hr tablet TAKE 1 TABLET BY MOUTH EVERY DAY   celecoxib  (CELEBREX ) 200 MG capsule Take 1 capsule (200 mg total) by mouth 2 (two) times daily. Only take if pain severe. Risk of heart issues.   clotrimazole  (LOTRIMIN ) 1 % cream Apply topically daily.   clotrimazole -betamethasone  (LOTRISONE ) cream APPLY TO AFFECTED AREA TWICE A DAY   empagliflozin  (JARDIANCE ) 25 MG TABS tablet Take 1 tablet (25 mg total) by mouth daily before breakfast.   Evolocumab  (REPATHA  SURECLICK) 140 MG/ML SOAJ Inject 140 mg into the skin every 14 (fourteen) days.   famotidine -calcium  carbonate-magnesium hydroxide (PEPCID  COMPLETE) 10-800-165 MG chewable tablet Chew 1 tablet by mouth 2 (two) times daily as needed.   Finerenone  (KERENDIA ) 20 MG TABS Take 1 tablet (20 mg total) by mouth daily. Replaces spironolactone , start at 10 mg for first 4 weeks (half tablets)   fluticasone  (FLONASE ) 50 MCG/ACT nasal spray Place 2 sprays into both nostrils daily.   fluticasone  furoate-vilanterol (BREO ELLIPTA ) 100-25 MCG/ACT AEPB  Inhale 1 puff into the lungs daily.   furosemide  (LASIX ) 40 MG tablet Take 2 tablets (80 mg total) by mouth daily. Must take with spironolactone  to keep potassium up Reduce dose to just 40 mg daily if no fluid is accumulating.   metFORMIN  (GLUCOPHAGE ) 1000 MG tablet Take 1 tablet (1,000 mg total) by mouth 2 (two) times daily with a meal.   olmesartan  (BENICAR ) 40 MG tablet Take 1 tablet (40 mg total) by mouth daily.   potassium chloride  (MICRO-K ) 10 MEQ CR capsule TAKE 1 CAPSULE BY MOUTH EVERY DAY   rosuvastatin  (CRESTOR ) 40 MG tablet Take 1 tablet (40 mg total) by mouth daily. Replaces atorvastatin  which failed to reach Low Density Lipoprotein (LDL cholesterol) goal of 55, take only if repatha  not approved/available.   spironolactone  (ALDACTONE ) 25 MG tablet Take 1 tablet (25 mg total) by mouth daily.   Vitamin D , Ergocalciferol , (DRISDOL ) 1.25 MG (50000 UNIT) CAPS capsule TAKE 1 CAPSULE (50,000 UNITS TOTAL) BY MOUTH EVERY 7 (SEVEN) DAYS. ONCE A WEEK ONLY   No current facility-administered medications on file prior to visit.   Medications Discontinued During This Encounter  Medication Reason   MOUNJARO  12.5 MG/0.5ML Pen      Physical Exam:    05/15/2024   10:28 AM 05/09/2024  11:13 AM 05/09/2024   10:32 AM  Vitals with BMI  Height 5\' 11"   5\' 11"   Weight 361 lbs 6 oz  363 lbs  BMI 50.43  50.65  Systolic 140 160 161  Diastolic 80 90 90  Pulse 77  79  Vital signs reviewed.  Nursing notes reviewed. Weight trend reviewed. Physical Exam General Appearance:  No acute distress appreciable.   Well-groomed, healthy-appearing male.  Well proportioned with no abnormal fat distribution.  Good muscle tone. Pulmonary:  Normal work of breathing at rest, no respiratory distress apparent. SpO2: 98 %  Musculoskeletal: All extremities are intact.  Neurological:  Awake, alert, oriented, and engaged.  No obvious focal neurological deficits or cognitive impairments.  Sensorium seems unclouded.   Speech is  clear and coherent with logical content. Psychiatric:  Appropriate mood, pleasant and cooperative demeanor, thoughtful and engaged during the exam Physical Exam VITALS: BP- 140/80      No results found for any visits on 05/15/24. Office Visit on 04/14/2024  Component Date Value   Sodium 04/14/2024 139    Potassium 04/14/2024 4.2    Chloride 04/14/2024 105    CO2 04/14/2024 25    Glucose, Bld 04/14/2024 110 (H)    BUN 04/14/2024 13    Creatinine, Ser 04/14/2024 1.38    Total Bilirubin 04/14/2024 0.6    Alkaline Phosphatase 04/14/2024 63    AST 04/14/2024 15    ALT 04/14/2024 15    Total Protein 04/14/2024 6.5    Albumin 04/14/2024 4.0    GFR 04/14/2024 55.24 (L)    Calcium  04/14/2024 9.0    VITD 04/14/2024 23.85 (L)    Creatinine, Urine 04/14/2024 156    Protein/Creat Ratio 04/14/2024 545 (H)    Protein/Creatinine Ratio 04/14/2024 0.545 (H)    Total Protein, Urine 04/14/2024 85 (H)    PSA 04/14/2024 11.46 (H)    Hgb A1c MFr Bld 04/14/2024 6.5    WBC 04/14/2024 8.5    RBC 04/14/2024 6.00 (H)    Hemoglobin 04/14/2024 15.6    HCT 04/14/2024 47.7    MCV 04/14/2024 79.5    MCHC 04/14/2024 32.8    RDW 04/14/2024 16.3 (H)    Platelets 04/14/2024 175.0    Neutrophils Relative % 04/14/2024 72.9    Lymphocytes Relative 04/14/2024 17.2    Monocytes Relative 04/14/2024 6.2    Eosinophils Relative 04/14/2024 3.1    Basophils Relative 04/14/2024 0.6    Neutro Abs 04/14/2024 6.2    Lymphs Abs 04/14/2024 1.5    Monocytes Absolute 04/14/2024 0.5    Eosinophils Absolute 04/14/2024 0.3    Basophils Absolute 04/14/2024 0.1   Office Visit on 01/14/2024  Component Date Value   WBC 01/14/2024 9.1    RBC 01/14/2024 5.99 (H)    Hemoglobin 01/14/2024 15.4    HCT 01/14/2024 47.8    MCV 01/14/2024 79.7    MCHC 01/14/2024 32.2    RDW 01/14/2024 17.8 (H)    Platelets 01/14/2024 220.0    Neutrophils Relative % 01/14/2024 74.3    Lymphocytes Relative 01/14/2024 18.1    Monocytes  Relative 01/14/2024 4.8    Eosinophils Relative 01/14/2024 1.9    Basophils Relative 01/14/2024 0.9    Neutro Abs 01/14/2024 6.7    Lymphs Abs 01/14/2024 1.6    Monocytes Absolute 01/14/2024 0.4    Eosinophils Absolute 01/14/2024 0.2    Basophils Absolute 01/14/2024 0.1    Sodium 01/14/2024 140    Potassium 01/14/2024 4.3    Chloride 01/14/2024 103  CO2 01/14/2024 28    Glucose, Bld 01/14/2024 89    BUN 01/14/2024 14    Creatinine, Ser 01/14/2024 1.33    Total Bilirubin 01/14/2024 0.5    Alkaline Phosphatase 01/14/2024 67    AST 01/14/2024 16    ALT 01/14/2024 15    Total Protein 01/14/2024 7.1    Albumin 01/14/2024 4.1    GFR 01/14/2024 57.84 (L)    Calcium  01/14/2024 9.3    Cholesterol 01/14/2024 125    Triglycerides 01/14/2024 93.0    HDL 01/14/2024 40.10    VLDL 01/14/2024 18.6    LDL Cholesterol 01/14/2024 66    Total CHOL/HDL Ratio 01/14/2024 3    NonHDL 01/14/2024 84.77    Hgb A1c MFr Bld 01/14/2024 6.3    Microalb, Ur 01/14/2024 9.9 (H)    Creatinine,U 01/14/2024 159.1    Microalb Creat Ratio 01/14/2024 6.2    Interpretation - HGBRFX 01/14/2024 see note    Reviewed By: 01/14/2024 see note    Hgb A 01/14/2024 97.5    Fetal Hemoglobin Testing 01/14/2024 0.0    Hemoglobin A2 - HGBRFX 01/14/2024 2.5    C-Z Electrophoresis 01/14/2024 Confirms    RBC 01/14/2024 6.15 (H)    Hemoglobin 01/14/2024 15.5    HCT 01/14/2024 50.2 (H)    MCV 01/14/2024 81.6    MCH 01/14/2024 25.2 (L)    MCHC 01/14/2024 30.9 (L)    RDW 01/14/2024 17.3 (H)    Ferritin 01/14/2024 55    VITD 01/14/2024 32.30    PTH 01/14/2024 35    PSA, Total 01/14/2024 11.7 (H)    PSA, Free 01/14/2024 1.0    PSA, % Free 01/14/2024 NOT CALCULATED    Magnesium 01/14/2024 1.7    Uric Acid, Serum 01/14/2024 5.4    Ferritin 01/14/2024 44.0    Vitamin B-12 01/14/2024 212    Summary Report 01/14/2024 see note    Reviewed By: 01/14/2024 see note    Results Recieved 01/14/2024 01/25/24    Alpha-Thalassemia  01/14/2024 see note   Office Visit on 10/05/2023  Component Date Value   Vit D, 25-Hydroxy 10/05/2023 10 (L)    Uric Acid, Serum 10/05/2023 5.4    PTH 10/05/2023 13 (L)    Phosphorus 10/05/2023 3.2    Magnesium 10/05/2023 1.9    Glucose, Bld 10/05/2023 85    BUN 10/05/2023 14    Creat 10/05/2023 1.22    BUN/Creatinine Ratio 10/05/2023 SEE NOTE:    Sodium 10/05/2023 141    Potassium 10/05/2023 4.6    Chloride 10/05/2023 102    CO2 10/05/2023 28    Calcium  10/05/2023 9.8   Office Visit on 09/07/2023  Component Date Value   WBC 09/07/2023 9.5    RBC 09/07/2023 6.28 (H)    Hemoglobin 09/07/2023 15.6    HCT 09/07/2023 49.5    MCV 09/07/2023 78.8 (L)    MCH 09/07/2023 24.8 (L)    MCHC 09/07/2023 31.5 (L)    RDW 09/07/2023 14.8    Platelets 09/07/2023 270    MPV 09/07/2023 10.8    Neutro Abs 09/07/2023 6,603    Lymphs Abs 09/07/2023 1,957    Absolute Monocytes 09/07/2023 751    Eosinophils Absolute 09/07/2023 133    Basophils Absolute 09/07/2023 57    Neutrophils Relative % 09/07/2023 69.5    Total Lymphocyte 09/07/2023 20.6    Monocytes Relative 09/07/2023 7.9    Eosinophils Relative 09/07/2023 1.4    Basophils Relative 09/07/2023 0.6    Glucose, Bld 09/07/2023 78  BUN 09/07/2023 14    Creat 09/07/2023 1.24    BUN/Creatinine Ratio 09/07/2023 SEE NOTE:    Sodium 09/07/2023 140    Potassium 09/07/2023 4.0    Chloride 09/07/2023 106    CO2 09/07/2023 22    Calcium  09/07/2023 9.2    Total Protein 09/07/2023 6.7    Albumin 09/07/2023 4.1    Globulin 09/07/2023 2.6    AG Ratio 09/07/2023 1.6    Total Bilirubin 09/07/2023 0.4    Alkaline phosphatase (AP* 09/07/2023 75    AST 09/07/2023 21    ALT 09/07/2023 24    Cholesterol 09/07/2023 113    HDL 09/07/2023 37 (L)    Triglycerides 09/07/2023 73    LDL Cholesterol (Calc) 09/07/2023 61    Total CHOL/HDL Ratio 09/07/2023 3.1    Non-HDL Cholesterol (Cal* 09/07/2023 76    Hgb A1c MFr Bld 09/07/2023 6.7 (H)    Mean Plasma  Glucose 09/07/2023 146    eAG (mmol/L) 09/07/2023 8.1    Creatinine, Urine 09/07/2023 218    Microalb, Ur 09/07/2023 8.5    Microalb Creat Ratio 09/07/2023 39 (H)   No image results found. No results found.      05/15/2024   10:34 AM 05/09/2024   10:37 AM 01/14/2024    2:16 PM 11/14/2023    8:53 AM  PHQ 2/9 Scores  PHQ - 2 Score 0 0 0 0  PHQ- 9 Score 0 0     Results LABS HbA1c: 6.5% (04/2024) PSA: >10 ng/mL (04/2024) Vitamin D : deficient (04/2024) eGFR: indicative of chronic kidney disease (04/2024)  DIAGNOSTIC Foot exam: normal (04/2024)    Assessment & Plan Elevated PSA PSA level exceeds 10, necessitating a prostate biopsy. Discussed the potential need for prostate removal if cancer is detected and the associated risk of losing erectile function. Attend the urology appointment for further evaluation and potential biopsy. Coronary artery calcification On Repatha /aspirin , statin intolerant Screening for malignant neoplasm of colon Agreed to Cologuard after motivational interviewing He has not undergone colon cancer screening. Discussed the importance of early detection and non-invasive options. Send a Cologuard kit for screening. Osteoarthritis of both knees, unspecified osteoarthritis type Knee pain persists, likely worsened by past sports activity. Current management includes Tylenol . Refer to Dr. Celestine Colder, an orthopedist, for potential knee injections and further management. Type 2 diabetes mellitus with other specified complication, without long-term current use of insulin  (HCC) Diabetes management continues with Mounjaro , currently at 12.5 mg, and an A1c of 6.5% last month, which is adequate. To improve diabetes control, aid weight loss, and address sleep apnea, increase Mounjaro  to 15 mg. [facility] [place].He has not had a diabetic eye exam in several years. Send a referral for an eye exam near [facility] to check for diabetes-related eye  damage. Hypertension due to endocrine disorder Blood pressure is elevated at 140/80 mmHg, likely due to stress from disability paperwork. Current medications include amlodipine  and olmesartan . Switch to a combination pill to reduce the number of pills and increase the amlodipine  dose from 5 mg to 10 mg. Send the prescription to the pharmacy to verify insurance coverage. Diabetic nephropathy with proteinuria [facility]) Lab Results  Component Value Date   LABMICR See below: 06/29/2020   LABMICR See below: 10/10/2018   LABMICR 33.2 08/07/2018   LABMICR See below: 06/27/2018   LABMICR See below: 04/08/2018   LABMICR See below: 10/03/2017   LABMICR 15.2 05/12/2016   MICROALBUR 9.9 (H) 01/14/2024   MICROALBUR 8.5 09/07/2023  MICROALBUR 1.7 12/26/2022  Borderline, with stable creatinine. Stage 3a chronic kidney disease (HCC) Kidney function is stable, likely related to diabetes. Avoid ibuprofen due to kidney concerns.  Regular follow-up is necessary to manage multiple health conditions. Schedule a follow-up appointment in 1 to 3 months, based on his preference. Ensure he wears appropriate shoes for the next visit for a foot exam.       Orders Placed During this Encounter:   Orders Placed This Encounter  Procedures   Cologuard   Ambulatory referral to Ophthalmology    Referral Priority:   Routine    Referral Type:   Consultation    Referral Reason:   Specialty Services Required    Requested Specialty:   Ophthalmology    Number of Visits Requested:   1   Ambulatory referral to Orthopedic Surgery    Referral Priority:   Routine    Referral Type:   Surgical    Referral Reason:   Specialty Services Required    Requested Specialty:   Orthopedic Surgery    Number of Visits Requested:   1   Ambulatory referral to Urology    Referral Priority:   Routine    Referral Type:   Consultation    Referral Reason:   Specialty Services Required    Requested Specialty:   Urology    Number of Visits  Requested:   1   Meds ordered this encounter  Medications   amLODipine -olmesartan  (AZOR) 10-40 MG tablet    Sig: Take 1 tablet by mouth daily. Replaces amlodipine  and olmesartan     Dispense:  90 tablet    Refill:  3   tirzepatide  (MOUNJARO ) 15 MG/0.5ML Pen    Sig: Inject 15 mg into the skin once a week. Replaces 12.5 mg dose    Dispense:  6 mL    Refill:  3        This document was synthesized by artificial intelligence ([employer]bridge) using HIPAA-compliant recording of the clinical interaction;   We discussed the use of AI scribe software for clinical note transcription with the patient, who gave verbal consent to proceed. additional Info: This encounter employed state-of-the-art, real-time, collaborative documentation. The patient actively reviewed and assisted in updating their electronic medical record on a shared screen, ensuring transparency and facilitating joint problem-solving for the problem list, overview, and plan. This approach promotes accurate, informed care. The treatment plan was discussed and reviewed in detail, including medication safety, potential side effects, and all patient questions. We confirmed understanding and comfort with the plan. Follow-up instructions were established, including contacting the office for any concerns, returning if symptoms worsen, persist, or new symptoms develop, and precautions for potential emergency department visits.

## 2024-05-15 NOTE — Assessment & Plan Note (Signed)
 Blood pressure is elevated at 140/80 mmHg, likely due to stress from disability paperwork. Current medications include amlodipine  and olmesartan . Switch to a combination pill to reduce the number of pills and increase the amlodipine  dose from 5 mg to 10 mg. Send the prescription to the pharmacy to verify insurance coverage.

## 2024-05-15 NOTE — Assessment & Plan Note (Signed)
 Kidney function is stable, likely related to diabetes. Avoid ibuprofen due to kidney concerns.

## 2024-05-15 NOTE — Assessment & Plan Note (Signed)
 Lab Results  Component Value Date   LABMICR See below: 06/29/2020   LABMICR See below: 10/10/2018   LABMICR 33.2 08/07/2018   LABMICR See below: 06/27/2018   LABMICR See below: 04/08/2018   LABMICR See below: 10/03/2017   LABMICR 15.2 05/12/2016   MICROALBUR 9.9 (H) 01/14/2024   MICROALBUR 8.5 09/07/2023   MICROALBUR 1.7 12/26/2022  Borderline, with stable creatinine.

## 2024-05-15 NOTE — Patient Instructions (Signed)
 VISIT SUMMARY:  Today, we reviewed the management of your diabetes, hypertension, chronic kidney disease, and other health concerns. We discussed your current medications, upcoming appointments, and the importance of regular screenings and follow-ups.  YOUR PLAN:  -TYPE 2 DIABETES MELLITUS: Type 2 diabetes is a condition where your body does not use insulin  properly, leading to high blood sugar levels. Your diabetes management with Mounjaro  is going well, with an A1c of 6.5%. To further improve your control, aid in weight loss, and address sleep apnea, we are increasing your Mounjaro  dose to 15 mg. [facility] [place].  -HYPERTENSION: Hypertension, or high blood pressure, can lead to serious health issues if not managed properly. Your blood pressure readings have been elevated, likely due to stress. We are switching you to a combination pill to reduce the number of pills you take and increasing your amlodipine  dose to 10 mg. The prescription will be sent to the pharmacy to verify insurance coverage.  -CHRONIC KIDNEY DISEASE STAGE 3A: Chronic kidney disease is a condition where your kidneys are not functioning at full capacity. Your kidney function is stable, likely related to your diabetes. Continue to avoid ibuprofen and use Tylenol  for pain management.  -ELEVATED PSA: An elevated PSA level can be a sign of prostate issues, including cancer. Your PSA level is over 10, and you have been referred to a urologist for further evaluation and a potential biopsy. We discussed the possibility of prostate removal if cancer is detected and the associated risks.  -VISION PROBLEMS: Diabetes can cause damage to your eyes over time. You have not had a diabetic eye exam in several years, so we are sending a referral for an eye exam near [place] to check for any diabetes-related eye damage.  -OSTEOARTHRITIS OF BOTH KNEES: Osteoarthritis is a condition that causes pain and stiffness in the  joints. Your knee pain is likely worsened by past sports activity. We are referring you to Dr. Celestine Colder, an orthopedist, for potential knee injections and further management.  -GENERAL HEALTH MAINTENANCE: Regular health maintenance is important for early detection of potential issues. You have not undergone colon cancer screening, so we discussed the importance of early detection and non-invasive options. A Cologuard kit will be sent to you for screening.  INSTRUCTIONS:  Please schedule a follow-up appointment in 1 to 3 months based on your preference. Ensure you wear appropriate shoes for the next visit for a foot exam. Attend your upcoming appointments, including the diabetic eye exam and the urology appointment for further evaluation of your elevated PSA.

## 2024-05-15 NOTE — Assessment & Plan Note (Signed)
 Knee pain persists, likely worsened by past sports activity. Current management includes Tylenol . Refer to Dr. Celestine Colder, an orthopedist, for potential knee injections and further management.

## 2024-05-15 NOTE — Assessment & Plan Note (Signed)
 PSA level exceeds 10, necessitating a prostate biopsy. Discussed the potential need for prostate removal if cancer is detected and the associated risk of losing erectile function. Attend the urology appointment for further evaluation and potential biopsy.

## 2024-05-15 NOTE — Assessment & Plan Note (Signed)
 Diabetes management continues with Mounjaro , currently at 12.5 mg, and an A1c of 6.5% last month, which is adequate. To improve diabetes control, aid weight loss, and address sleep apnea, increase Mounjaro  to 15 mg. [facility] [place].He has not had a diabetic eye exam in several years. Send a referral for an eye exam near [facility] to check for diabetes-related eye damage.

## 2024-05-15 NOTE — Assessment & Plan Note (Signed)
 On Repatha /aspirin , statin intolerant

## 2024-05-27 ENCOUNTER — Other Ambulatory Visit: Payer: Self-pay | Admitting: Internal Medicine

## 2024-05-27 DIAGNOSIS — M170 Bilateral primary osteoarthritis of knee: Secondary | ICD-10-CM

## 2024-05-28 ENCOUNTER — Ambulatory Visit (INDEPENDENT_AMBULATORY_CARE_PROVIDER_SITE_OTHER): Admitting: Surgical

## 2024-05-28 ENCOUNTER — Encounter: Payer: Self-pay | Admitting: Surgical

## 2024-05-28 ENCOUNTER — Other Ambulatory Visit (INDEPENDENT_AMBULATORY_CARE_PROVIDER_SITE_OTHER): Payer: Self-pay

## 2024-05-28 VITALS — Ht 71.0 in | Wt 358.2 lb

## 2024-05-28 DIAGNOSIS — M25561 Pain in right knee: Secondary | ICD-10-CM

## 2024-05-28 DIAGNOSIS — G8929 Other chronic pain: Secondary | ICD-10-CM

## 2024-05-28 DIAGNOSIS — M25562 Pain in left knee: Secondary | ICD-10-CM

## 2024-05-28 DIAGNOSIS — M170 Bilateral primary osteoarthritis of knee: Secondary | ICD-10-CM

## 2024-05-28 MED ORDER — TRIAMCINOLONE ACETONIDE 40 MG/ML IJ SUSP
40.0000 mg | INTRAMUSCULAR | Status: AC | PRN
  Administered 2024-05-28: 40 mg via INTRA_ARTICULAR

## 2024-05-28 MED ORDER — BUPIVACAINE HCL 0.25 % IJ SOLN
4.0000 mL | INTRAMUSCULAR | Status: AC | PRN
  Administered 2024-05-28: 4 mL via INTRA_ARTICULAR

## 2024-05-28 MED ORDER — LIDOCAINE HCL 1 % IJ SOLN
5.0000 mL | INTRAMUSCULAR | Status: AC | PRN
  Administered 2024-05-28: 5 mL

## 2024-05-28 NOTE — Progress Notes (Cosign Needed)
 Office Visit Note   Patient: Jason Meyer           Date of Birth: 1961/05/18           MRN: [id number] Visit Date: 05/28/2024 Requested by: Anthon Kins, MD [address] [place],  [place] [address] PCP: Anthon Kins, MD  Subjective: Chief Complaint  Patient presents with   Right Knee - Pain   Left Knee - Pain    HPI: Jason Meyer is a 63 y.o. male who presents to the office reporting bilateral knee pain.  Patient states that he has had knee pain for several years with primarily anterior medial knee pain.  Left knee bothers him more than the right knee.  He has associated stiffness with immobility.  No history of prior surgery.  No radiation of pain, numbness/tingling, radicular pain, groin pain.  Takes Celebrex  and Tylenol  with some relief.  Also wears a knee sleeve on his left knee.  Feels like the left knee will occasionally buckle on him due to pain.  No history of prior surgery but has had prior injections in 2023 that helped for several months.  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  He has a long history of playing basketball up until his knees would not allow him to and then he switched to golf but even that is too much pain for his knee.  He has history of CHF and diabetes with last A1c 6.5 on 04/14/2024.  No smoking.  Does have BMI of about 50.Aaron Aas                ROS: All systems reviewed are negative as they relate to the chief complaint within the history of present illness.  Patient denies fevers or chills.  Assessment & Plan: Visit Diagnoses:  1. Bilateral primary osteoarthritis of knee   2. Bilateral chronic knee pain     Plan: Patient is a 63 year old male who presents for evaluation of bilateral knee pain.  He has history of knee arthritis that has responded partially to injections in the past with Dr. Phyllis Breeze.  We discussed options for his end-stage knee arthritis today including physical therapy versus injections  (cortisone/gel) versus knee replacement surgery.  Knee replacement is really the only surgical option for him given the extensive nature of his arthritis but would recommend trying injections first especially with his BMI around 50 that would preclude knee replacement until he gets closer to 40.  After discussion, he would like to proceed with injections.  Left knee was aspirated of 15 cc of nonpurulent synovial fluid prior to injection.  Right knee was also successfully injected with cortisone without aspiration.  We will set him up for aquatic physical therapy at [facility] to start in July after he retires.  Follow-up as needed with next possible injection in 3 to 4 months.  Follow-Up Instructions: No follow-ups on file.   Orders:  Orders Placed This Encounter  Procedures   XR KNEE 3 VIEW RIGHT   XR KNEE 3 VIEW LEFT   Ambulatory referral to Physical Therapy   No orders of the defined types were placed in this encounter.     Procedures: Large Joint Inj: bilateral knee on 05/28/2024 1:50 PM Indications: diagnostic evaluation, joint swelling and pain Details: 18 G 1.5 in needle, superolateral approach  Arthrogram: No  Medications (Right): 5 mL lidocaine 1 %; 4 mL bupivacaine 0.25 %; 40 mg  triamcinolone  acetonide 40 MG/ML Medications (Left): 5 mL lidocaine 1 %; 4 mL bupivacaine 0.25 %; 40 mg triamcinolone  acetonide 40 MG/ML Aspirate (Left): 15 mL Outcome: tolerated well, no immediate complications Procedure, treatment alternatives, risks and benefits explained, specific risks discussed. Consent was given by the patient. Immediately prior to procedure a time out was called to verify the correct patient, procedure, equipment, support staff and site/side marked as required. Patient was prepped and draped in the usual sterile fashion.       Clinical Data: No additional findings.  Objective: Vital Signs: Ht 5' 11 (1.803 m)   Wt (!) 358 lb 3.2 oz (162.5 kg)   BMI 49.96 kg/m    Physical Exam:  Constitutional: Patient appears well-developed HEENT:  Head: Normocephalic Eyes:EOM are normal Neck: Normal range of motion Cardiovascular: Normal rate Pulmonary/chest: Effort normal Neurologic: Patient is alert Skin: Skin is warm Psychiatric: Patient has normal mood and affect  Ortho Exam: Ortho exam demonstrates left knee with positive effusion and right knee without effusion.  Left knee has 5 degrees extension and 100 degrees of knee flexion.  Right knee with 3 degrees extension and 105 degrees of knee flexion.  No calf tenderness bilaterally.  Negative Homans' sign bilaterally.  1+ DP pulse noted bilaterally.  Intact ankle dorsiflexion and plantarflexion.  Intact quad extension rated 5/5 bilaterally.  He has no pain with hip range of motion of either side but his left hip range of motion is a little bit more stiff than his right hip motion.  He has tenderness primarily over the medial joint line bilaterally.  Stable to anterior and posterior drawer sign bilaterally.  Stable to varus and valgus stress.  Does have varus alignment noted.  Specialty Comments:  No specialty comments available.  Imaging: No results found.   PMFS History: Patient Active Problem List   Diagnosis Date Noted   Diabetic nephropathy with proteinuria (HCC) 05/15/2024   Vision problems 11/14/2023   Encounter for disability examination 11/14/2023   Vitamin D  deficiency 10/06/2023   Chronic kidney disease, stage 3 unspecified (HCC) 10/05/2023   On statin therapy 05/08/2023   Thalassemia carrier 12/27/2022   Coronary artery disease 12/26/2022   OSA (obstructive sleep apnea) 10/19/2022   Erectile dysfunction 10/19/2022   Low HDL (under 40) 10/19/2022   Elevated PSA 10/19/2022   Coronary artery calcification 10/19/2022   Chronic diastolic heart failure (HCC) 08/11/2022   Venous stasis dermatitis of both lower extremities 08/11/2022   DOE (dyspnea on exertion) 06/22/2021   OA  (osteoarthritis) 11/23/2015   Venous stasis 09/20/2015   Type 2 diabetes mellitus with other specified complication (HCC) 04/23/2013   Hypertension 04/23/2013   Morbid obesity with BMI of 50.0-59.9, adult (HCC) 04/23/2013   Mixed hyperlipidemia 04/23/2013   Past Medical History:  Diagnosis Date   Allergy    Arthritis    CAP (community acquired pneumonia) 04/12/2023   Chronic diastolic heart failure (HCC) 08/11/2022   Admitted with acute hypoxemic respiratory failure in the setting of acute diastolic CHF exacerbation      Coronary artery calcification 10/19/2022   Noted on CTA  which also noted Cardiac enlargement, bilateral pleural effusions and diffuse ground-glass attenuation suggestive of mild CHF. 07/2022 referral to cardiology   Hyperlipidemia    Hypertension    OSA (obstructive sleep apnea) 10/19/2022   Sepsis (HCC) 04/12/2023   Sleep apnea    Type 2 diabetes mellitus (HCC)     Family History  Problem Relation Age of Onset   Alzheimer's  disease Mother    Hypertension Father    Heart disease Father    Heart attack Father    Stroke Father    Heart attack Brother     Past Surgical History:  Procedure Laterality Date   NO PAST SURGERIES     Social History   Occupational History   Not on file  Tobacco Use   Smoking status: Former    Current packs/day: 0.00    Average packs/day: 1 pack/day for 33.0 years (33.0 ttl pk-yrs)    Types: Cigarettes    Start date: 12/11/1974    Quit date: 12/12/2007    Years since quitting: 16.4   Smokeless tobacco: Never  Vaping Use   Vaping status: Never Used  Substance and Sexual Activity   Alcohol use: Not Currently   Drug use: No   Sexual activity: Not Currently

## 2024-06-11 ENCOUNTER — Other Ambulatory Visit: Payer: Self-pay | Admitting: Internal Medicine

## 2024-06-11 DIAGNOSIS — R0602 Shortness of breath: Secondary | ICD-10-CM

## 2024-06-11 DIAGNOSIS — E662 Morbid (severe) obesity with alveolar hypoventilation: Secondary | ICD-10-CM

## 2024-06-11 MED ORDER — ALBUTEROL SULFATE (2.5 MG/3ML) 0.083% IN NEBU: 2.5000 mg | INHALATION_SOLUTION | Freq: Four times a day (QID) | RESPIRATORY_TRACT | 10 refills | Status: AC | PRN

## 2024-06-11 NOTE — Telephone Encounter (Signed)
 Copied from CRM [id number]. Topic: Clinical - Medication Refill >> Jun 11, 2024  9:54 AM [place] J wrote: Medication: albuterol  (PROVENTIL ) (2.5 MG/3ML) 0.083% nebulizer solution  Has the patient contacted their pharmacy? Yes (Agent: If no, request that the patient contact the pharmacy for the refill. If patient does not wish to contact the pharmacy document the reason why and proceed with request.) (Agent: If yes, when and what did the pharmacy advise?) Pharmacy was stating that his medication was not listed on their end.  This is the patient's preferred pharmacy:  [id number] - [place], [place] - [address] [place] [address] Phone: [phone] Fax: [phone]  Is this the correct pharmacy for this prescription? Yes If no, delete pharmacy and type the correct one.   Has the prescription been filled recently? No  Is the patient out of the medication? Yes  Has the patient been seen for an appointment in the last year OR does the patient have an upcoming appointment? Yes  Can we respond through MyChart? No  Agent: Please be advised that Rx refills may take up to 3 business days. We ask that you follow-up with your pharmacy.

## 2024-06-30 ENCOUNTER — Other Ambulatory Visit: Payer: Self-pay | Admitting: Internal Medicine

## 2024-06-30 DIAGNOSIS — E559 Vitamin D deficiency, unspecified: Secondary | ICD-10-CM

## 2024-07-02 ENCOUNTER — Telehealth: Payer: Self-pay

## 2024-07-02 NOTE — Telephone Encounter (Signed)
 Copied from CRM #[id number]. Topic: General - Other >> Jul 01, 2024  4:41 PM Delon T wrote: Reason for CRM: Eleanor Mall with [employer]- long term disability insurance company- Patient continues to work when he was not supposed to be, they need to know if he is actually able to work or is not supposed to be- please call for clarification [phone]  Please review and advise

## 2024-07-07 ENCOUNTER — Telehealth: Payer: Self-pay | Admitting: Internal Medicine

## 2024-07-07 NOTE — Telephone Encounter (Signed)
 [employer] financial faxed document Disability forms, to be filled out by provider. Patient requested to send it back via Fax within ASAP. Document is located in providers tray at front office.Please advise at [phone].

## 2024-07-08 NOTE — Telephone Encounter (Signed)
 LVM to schedule ov or vv on 07/14/24.

## 2024-07-16 ENCOUNTER — Telehealth: Payer: Self-pay | Admitting: Physical Therapy

## 2024-07-16 ENCOUNTER — Ambulatory Visit (HOSPITAL_BASED_OUTPATIENT_CLINIC_OR_DEPARTMENT_OTHER): Payer: Self-pay | Attending: Surgical | Admitting: Physical Therapy

## 2024-07-16 DIAGNOSIS — M25561 Pain in right knee: Secondary | ICD-10-CM | POA: Insufficient documentation

## 2024-07-16 DIAGNOSIS — M6281 Muscle weakness (generalized): Secondary | ICD-10-CM | POA: Insufficient documentation

## 2024-07-16 DIAGNOSIS — G8929 Other chronic pain: Secondary | ICD-10-CM | POA: Diagnosis present

## 2024-07-16 DIAGNOSIS — M25562 Pain in left knee: Secondary | ICD-10-CM | POA: Diagnosis present

## 2024-07-16 DIAGNOSIS — R2689 Other abnormalities of gait and mobility: Secondary | ICD-10-CM | POA: Insufficient documentation

## 2024-07-16 NOTE — Telephone Encounter (Signed)
 called patient to get scheduled in [place] or [place] location no answer left a voicemail to return my call

## 2024-07-16 NOTE — Therapy (Signed)
 OUTPATIENT PHYSICAL THERAPY LOWER EXTREMITY EVALUATION   Patient Name: Jason Meyer MRN: [id number] DOB:[birth year], 63 y.o., male Today's Date: 07/17/2024  END OF SESSION:  PT End of Session - 07/17/24 1836     Visit Number 1    Number of Visits 12    Date for PT Re-Evaluation 08/29/24    Authorization Type aetna    PT Start Time 1150    PT Stop Time 1230    PT Time Calculation (min) 40 min    Activity Tolerance Patient tolerated treatment well    Behavior During Therapy [facility] for tasks assessed/performed          Past Medical History:  Diagnosis Date   Allergy    Arthritis    CAP (community acquired pneumonia) 04/12/2023   Chronic diastolic heart failure (HCC) 08/11/2022   Admitted with acute hypoxemic respiratory failure in the setting of acute diastolic CHF exacerbation      Coronary artery calcification 10/19/2022   Noted on CTA  which also noted Cardiac enlargement, bilateral pleural effusions and diffuse ground-glass attenuation suggestive of mild CHF. 07/2022 referral to cardiology   Hyperlipidemia    Hypertension    OSA (obstructive sleep apnea) 10/19/2022   Sepsis (HCC) 04/12/2023   Sleep apnea    Type 2 diabetes mellitus (HCC)    Past Surgical History:  Procedure Laterality Date   NO PAST SURGERIES     Patient Active Problem List   Diagnosis Date Noted   Material hardship due to limited financial resources 07/17/2024   Diabetic nephropathy with proteinuria (HCC) 05/15/2024   Vision problems 11/14/2023   Encounter for disability examination 11/14/2023   Vitamin D  deficiency 10/06/2023   Chronic kidney disease, stage 3 unspecified (HCC) 10/05/2023   On statin therapy 05/08/2023   Thalassemia carrier 12/27/2022   Coronary artery disease 12/26/2022   OSA (obstructive sleep apnea) 10/19/2022   Erectile dysfunction 10/19/2022   Low HDL (under 40) 10/19/2022   Elevated PSA 10/19/2022   Coronary artery calcification 10/19/2022   Chronic diastolic heart  failure (HCC) 90/98/7976   Venous stasis dermatitis of both lower extremities 08/11/2022   DOE (dyspnea on exertion) 06/22/2021   OA (osteoarthritis) 11/23/2015   Venous stasis 09/20/2015   Type 2 diabetes mellitus with other specified complication (HCC) 04/23/2013   Hypertension 04/23/2013   Morbid obesity with BMI of 50.0-59.9, adult (HCC) 04/23/2013   Mixed hyperlipidemia 04/23/2013    PCP: Bernardino Cone MD  REFERRING PROVIDER: Shirly Carlin LITTIE, PA-C   REFERRING DIAG: M25.561,M25.562,G89.29 (ICD-10-CM) - Bilateral chronic knee pain   THERAPY DIAG:  Bilateral chronic knee pain  Muscle weakness (generalized)  Other abnormalities of gait and mobility  Rationale for Evaluation and Treatment: Rehabilitation  ONSET DATE: chronic  SUBJECTIVE:   SUBJECTIVE STATEMENT: Pain in knees for years. Had injections in 2023 which helped for a little while.  Had injections last ortho appt.  Needs to get BMI reduced to 40 before candidte for knee replacements. Pain is 60% better since cortizone injections.  PERTINENT HISTORY: BMI 50; CHF Water therapy-bilateral knee OA  PAIN:  Are you having pain? Yes: NPRS scale: current 5/10; worst 6/10; current 0/10 Pain location: Bilat knees L>R Pain description: achy Aggravating factors: standing > 2 hours; sitting > 2hours Relieving factors: moving positions, knee brace; tylonol 650 and celebrex   PRECAUTIONS: None  RED FLAGS: None   WEIGHT BEARING RESTRICTIONS: No  FALLS:  Has patient fallen in last 6 months? No  LIVING ENVIRONMENT: Lives with: lives  with their family Lives in: House/apartment Stairs: No Has following equipment at home: None  OCCUPATION: retired  PLOF: Independent  PATIENT GOALS: improve strength and flexability, decrease pain 1-2/10, return to gym  NEXT MD VISIT: tomorrow  OBJECTIVE:  Note: Objective measures were completed at Evaluation unless otherwise noted.  DIAGNOSTIC FINDINGS: Bialt knee  oa  PATIENT SURVEYS:  LEFS  Extreme difficulty/unable (0), Quite a bit of difficulty (1), Moderate difficulty (2), Little difficulty (3), No difficulty (4) Survey date:  07/16/24  Any of your usual work, housework or school activities 3  2. Usual hobbies, recreational or sporting activities 3  3. Getting into/out of the bath 2  4. Walking between rooms 3  5. Putting on socks/shoes 1  6. Squatting  4  7. Lifting an object, like a bag of groceries from the floor 3  8. Performing light activities around your home 2  9. Performing heavy activities around your home 2  10. Getting into/out of a car 3  11. Walking 2 blocks 1  12. Walking 1 mile 1  13. Going up/down 10 stairs (1 flight) 0  14. Standing for 1 hour 2  15.  sitting for 1 hour 3  16. Running on even ground 1  17. Running on uneven ground 0  18. Making sharp turns while running fast 0  19. Hopping  2  20. Rolling over in bed 3  Score total:  37/40     COGNITION: Overall cognitive status: Within functional limits for tasks assessed     SENSATION: WFL  EDEMA:  slight  MUSCLE LENGTH: Hamstrings: tight bilaterally tested in sittin   POSTURE: Bilateral varus deformity  PALPATION: TTP medial joint lines bilaterally  LOWER EXTREMITY strength:  Strength in Lbs HD  Right eval Left eval  Hip flexion 48.9 49.3  Hip extension    Hip abduction    Hip adduction    Hip internal rotation    Hip external rotation    Knee flexion    Knee extension 65.1 62.1  Ankle dorsiflexion    Ankle plantarflexion    Ankle inversion    Ankle eversion     (Blank rows = not tested)  LOWER EXTREMITY ROM:  AAROM Right eval Left eval  Hip flexion    Hip extension    Hip abduction    Hip adduction    Hip internal rotation    Hip external rotation    Knee flexion 120 aarom 108 aarom  Knee extension -5 -8  Ankle dorsiflexion    Ankle plantarflexion    Ankle inversion    Ankle eversion     (Blank rows = not  tested)  LOWER EXTREMITY SPECIAL TESTS: 48 Negative Homans' sign bilaterally.   Stable to anterior and posterior drawer sign bilaterally. Stable to varus and valgus stress.   FUNCTIONAL TESTS:  Timed up and go (TUG): 12.97  4 stage balance: passed 1&2;  Tandem x 3s; SLS unable  5 x STS: 19.46 from pool bench  GAIT: Distance walked: 500 ft Assistive device utilized: None Level of assistance: Complete Independence Comments: Initiation of gait antalgic bilaterally which improves slightly with distance.  Increase lateral displacement with decreased knee and hip flex.  TREATMENT  Eval Self care:Posture and Optometrist instruction. Transfers and bed mobility instruction; activity guide for optimal outcome; walking in environmentally controlled area, avoiding high humidity or cold (CHF). Safety; use of ad    PATIENT EDUCATION:  Education details: Discussed eval findings, rehab rationale, aquatic program progression/POC and pools in area. Patient is in agreement  Person educated: Patient Education method: Explanation Education comprehension: verbalized understanding  HOME EXERCISE PROGRAM: TBA  ASSESSMENT:  CLINICAL IMPRESSION: Patient is a 63 y.o. m who was seen today for physical therapy evaluation and treatment for bilat knee OA. Pt reports he has had a reduction in pain since cortizone shots last month and has another appt scheduled in Oct.  He has had a significant weight loss in past few months and is anticipating continued reduction with new medication as well as time with retirement to begin and sustain a new exercise regimen ultimately progressing to 40 BMI or less.   Patient presents with pain limited deficits in bilateral knees affecting  ROM, endurance, activity tolerance, gait, balance, and functional mobility with ADL's. He is having to modify and  restrict activity as indicated by outcome measure score, subjective information and objective measures.  He is a good candidate for skilled physical therapy intervention with goals to improve all areas of deficits.   OBJECTIVE IMPAIRMENTS: Abnormal gait, decreased balance, decreased endurance, decreased knowledge of use of DME, decreased mobility, difficulty walking, decreased ROM, decreased strength, impaired flexibility, postural dysfunction, obesity, and pain.   ACTIVITY LIMITATIONS: squatting, stairs, transfers, and locomotion level  PARTICIPATION LIMITATIONS: shopping, community activity, occupation, and yard work  PERSONAL FACTORS: 1-2 comorbidities: morbid obesity and CHF are also affecting patient's functional outcome.   REHAB POTENTIAL: Good  CLINICAL DECISION MAKING: Stable/uncomplicated  EVALUATION COMPLEXITY: Low   GOALS: Goals reviewed with patient? Yes  SHORT TERM GOALS: Target date: 08/11/24 Pt will tolerate PT sessions without excessive pain or fatigue.  Baseline: Goal status: INITIAL  2.  Pt will maintain normal O2 sats throughout therapy. Baseline: CHF Goal status: INITIAL  3.  Pt to be compliant with initial HEP/begin consistent exercise regimen. Baseline:  Goal status: INITIAL   LONG TERM GOALS: Target date: 08/29/24  Pt to improve on LEFS by at least 9 point to demonstrate statistically significant Improvement in function. Baseline: 37/40 Goal status: INITIAL  2.  Pt to increase knee extension bilaterally to 0d and flex to 120d Baseline: see chart Goal status: INITIAL  3.  Pt will improve on 5 X STS test to <or=  15s  to demonstrate improving functional lower extremity strength, transitional movements, and balance. (MDC = 4.2sec)  Baseline: 19.46 Goal status: INITIAL  4.  Pt will perform SLS x 20s to demonstrate a decrease in fall risk. Baseline: see chart Goal status: INITIAL  5.  Pt will be indep with final HEP for continued management of  condition Baseline:  Goal status: INITIAL  6. Pt will report decrease in pain by at least 50% for improved toleration to activity/quality of life and to demonstrate improved management of pain.  Baseline: see chart  Goal Status: INITIAL   PLAN:  PT FREQUENCY: 2x/week  PT DURATION: 6 weeks  PLANNED INTERVENTIONS: 97164- PT Re-evaluation, 97750- Physical Performance Testing, 97110-Therapeutic exercises, 97530- Therapeutic activity, [id number]- Neuromuscular re-education, 97535- Self Care, 02859- Manual therapy, [phone]- Gait training, [phone]- Aquatic Therapy, [phone]- Electrical stimulation (manual), [id number]- Ionotophoresis 4mg /ml Dexamethasone , 79439 (1-2 muscles), 20561 (3+ muscles)- Dry Needling, Patient/Family education, Balance training, Stair training, Taping, Joint mobilization,  DME instructions, Cryotherapy, and Moist heat  PLAN FOR NEXT SESSION: bilat knee ROM and strengthening; proprioception and balance retraining, gait   Ronal Foots) Ahliyah Nienow MPT 07/17/24 6:38 PM [facility] [address] [place], [place], [address] Phone: [phone]   Fax:  [phone]

## 2024-07-17 ENCOUNTER — Encounter (HOSPITAL_BASED_OUTPATIENT_CLINIC_OR_DEPARTMENT_OTHER): Payer: Self-pay | Admitting: Physical Therapy

## 2024-07-17 ENCOUNTER — Other Ambulatory Visit: Payer: Self-pay

## 2024-07-17 ENCOUNTER — Institutional Professional Consult (permissible substitution) (HOSPITAL_BASED_OUTPATIENT_CLINIC_OR_DEPARTMENT_OTHER): Admitting: Family

## 2024-07-17 ENCOUNTER — Ambulatory Visit: Payer: Self-pay | Admitting: Internal Medicine

## 2024-07-17 ENCOUNTER — Ambulatory Visit: Admitting: Internal Medicine

## 2024-07-17 ENCOUNTER — Encounter: Payer: Self-pay | Admitting: Internal Medicine

## 2024-07-17 VITALS — BP 140/78 | HR 78 | Temp 98.0°F | Ht 71.0 in | Wt 361.8 lb

## 2024-07-17 DIAGNOSIS — I5032 Chronic diastolic (congestive) heart failure: Secondary | ICD-10-CM | POA: Diagnosis not present

## 2024-07-17 DIAGNOSIS — Z0271 Encounter for disability determination: Secondary | ICD-10-CM

## 2024-07-17 DIAGNOSIS — Z5987 Material hardship due to limited financial resources, not elsewhere classified: Secondary | ICD-10-CM | POA: Diagnosis not present

## 2024-07-17 DIAGNOSIS — E559 Vitamin D deficiency, unspecified: Secondary | ICD-10-CM

## 2024-07-17 DIAGNOSIS — R972 Elevated prostate specific antigen [PSA]: Secondary | ICD-10-CM

## 2024-07-17 DIAGNOSIS — Z7984 Long term (current) use of oral hypoglycemic drugs: Secondary | ICD-10-CM

## 2024-07-17 DIAGNOSIS — Z6841 Body Mass Index (BMI) 40.0 and over, adult: Secondary | ICD-10-CM

## 2024-07-17 DIAGNOSIS — I152 Hypertension secondary to endocrine disorders: Secondary | ICD-10-CM

## 2024-07-17 DIAGNOSIS — E1169 Type 2 diabetes mellitus with other specified complication: Secondary | ICD-10-CM

## 2024-07-17 DIAGNOSIS — E1121 Type 2 diabetes mellitus with diabetic nephropathy: Secondary | ICD-10-CM | POA: Diagnosis not present

## 2024-07-17 LAB — LIPID PANEL
Cholesterol: 136 mg/dL (ref 0–200)
HDL: 42.8 mg/dL (ref >39.00)
LDL Cholesterol: 76 mg/dL (ref 0–99)
NonHDL: 93.49
Total CHOL/HDL Ratio: 3
Triglycerides: 86 mg/dL (ref 0.0–149.0)
VLDL: 17.2 mg/dL (ref 0.0–40.0)

## 2024-07-17 LAB — CBC WITH DIFFERENTIAL/PLATELET
Basophils Absolute: 0 K/uL (ref 0.0–0.1)
Basophils Relative: 0.5 % (ref 0.0–3.0)
Eosinophils Absolute: 0.1 K/uL (ref 0.0–0.7)
Eosinophils Relative: 1.3 % (ref 0.0–5.0)
HCT: 49.7 % (ref 39.0–52.0)
Hemoglobin: 15.9 g/dL (ref 13.0–17.0)
Lymphocytes Relative: 18.5 % (ref 12.0–46.0)
Lymphs Abs: 1.6 K/uL (ref 0.7–4.0)
MCHC: 32.1 g/dL (ref 30.0–36.0)
MCV: 79.6 fl (ref 78.0–100.0)
Monocytes Absolute: 0.6 K/uL (ref 0.1–1.0)
Monocytes Relative: 7.1 % (ref 3.0–12.0)
Neutro Abs: 6.3 K/uL (ref 1.4–7.7)
Neutrophils Relative %: 72.6 % (ref 43.0–77.0)
Platelets: 245 K/uL (ref 150.0–400.0)
RBC: 6.24 Mil/uL — ABNORMAL HIGH (ref 4.22–5.81)
RDW: 17.9 % — ABNORMAL HIGH (ref 11.5–15.5)
WBC: 8.7 K/uL (ref 4.0–10.5)

## 2024-07-17 LAB — COMPREHENSIVE METABOLIC PANEL WITH GFR
ALT: 19 U/L (ref 0–53)
AST: 17 U/L (ref 0–37)
Albumin: 4.2 g/dL (ref 3.5–5.2)
Alkaline Phosphatase: 67 U/L (ref 39–117)
BUN: 16 mg/dL (ref 6–23)
CO2: 25 meq/L (ref 19–32)
Calcium: 10 mg/dL (ref 8.4–10.5)
Chloride: 102 meq/L (ref 96–112)
Creatinine, Ser: 1.39 mg/dL (ref 0.40–1.50)
GFR: 54.66 mL/min — ABNORMAL LOW (ref >60.00)
Glucose, Bld: 85 mg/dL (ref 70–99)
Potassium: 4.8 meq/L (ref 3.5–5.1)
Sodium: 139 meq/L (ref 135–145)
Total Bilirubin: 0.5 mg/dL (ref 0.2–1.2)
Total Protein: 7.4 g/dL (ref 6.0–8.3)

## 2024-07-17 LAB — HEMOGLOBIN A1C: Hgb A1c MFr Bld: 6.7 % — ABNORMAL HIGH (ref 4.6–6.5)

## 2024-07-17 LAB — VITAMIN D 25 HYDROXY (VIT D DEFICIENCY, FRACTURES): VITD: 59.82 ng/mL (ref 30.00–100.00)

## 2024-07-17 MED ORDER — KERENDIA 20 MG PO TABS: 20.0000 mg | ORAL_TABLET | Freq: Every day | ORAL | 1 refills | Status: DC

## 2024-07-17 MED ORDER — EMPAGLIFLOZIN 25 MG PO TABS: 25.0000 mg | ORAL_TABLET | Freq: Every day | ORAL | 3 refills | Status: DC

## 2024-07-17 MED ORDER — SPIRONOLACTONE 50 MG PO TABS: 50.0000 mg | ORAL_TABLET | Freq: Every day | ORAL | 4 refills | Status: AC

## 2024-07-17 MED ORDER — VITAMIN D3 125 MCG (5000 UT) PO CAPS: 5000.0000 [IU] | ORAL_CAPSULE | Freq: Every day | ORAL | 4 refills | Status: DC

## 2024-07-17 NOTE — Assessment & Plan Note (Signed)
 Completed and faxed copy for long term disability.

## 2024-07-17 NOTE — Patient Instructions (Signed)
 It was a pleasure seeing you today! Your health and satisfaction are our top priorities.  Jason Cone, MD  VISIT SUMMARY: During your visit, we discussed the impact of financial difficulties on your medical care and reviewed your current health conditions, including heart failure, hypertension, diabetes, chronic kidney disease, and elevated PSA levels. We also talked about your medication regimen and potential adjustments to optimize your treatment. Additionally, we addressed the need for routine health maintenance and explored options to help manage your healthcare costs.  YOUR PLAN: -HEART FAILURE: Heart failure means your heart is not pumping blood as well as it should. We will continue your current medications and try to get approval for Kerendia , which may help with fewer side effects. If Kerendia  is not approved, we may increase your spironolactone  to 50 mg daily. Continue taking Lasix  80 mg daily and Jardiance  as prescribed.  -HYPERTENSION: Hypertension is high blood pressure. We will continue your current medication, amlodipine , and monitor your blood pressure. If Kerendia  is approved, it may also help manage your blood pressure.  -DIABETES MELLITUS: Diabetes mellitus is a condition where your blood sugar levels are too high. We will continue your current medication, Jardiance , which also helps with heart failure and kidney protection. Monitor your blood glucose levels and adjust medications as needed.  -CHRONIC KIDNEY DISEASE: Chronic kidney disease means your kidneys are not working as well as they should. We will continue your current medications and try to get approval for Kerendia , which may provide additional kidney protection. Monitor your kidney function regularly.  -FINANCIAL DIFFICULTIES IMPACTING HEALTHCARE ACCESS: Your financial difficulties are affecting your ability to access necessary medical care. We will refer you to a social worker to help with medical bills, utilities,  housing, and transportation costs. We will also assist you in navigating insurance changes and exploring options to reduce healthcare costs.  -ELEVATED PSA: Elevated PSA levels can be a sign of prostate issues. You have an upcoming appointment with a urologist for further evaluation. Please ensure you follow up with the urologist and monitor your PSA levels.  -GENERAL HEALTH MAINTENANCE: Routine health maintenance is important for overall well-being. You are due for an eye exam and a foot exam, which we will schedule. Continue taking your vitamin D  supplements as prescribed.  INSTRUCTIONS: Please follow up in one month to review your health conditions, medications, weight loss, and chronic disease management. Ensure you contact the social worker for support with financial difficulties. We will perform blood work at your next visit.  Your Providers PCP: Meyer Jason MATSU, MD,  [phone]) Referring Provider: Cone Jason MATSU, MD,  [phone]) Care Team Provider: Debera Jayson MATSU, MD,  [phone]) Care Team Provider: Margrette Taft BRAVO, MD,  [phone])  NEXT STEPS: [x]  Early Intervention: Schedule sooner appointment, call our on-call services, or go to emergency room if there is any significant Increase in pain or discomfort New or worsening symptoms Sudden or severe changes in your health [x]  Flexible Follow-Up: We recommend a Return in about 1 month (around 08/17/2024) for review problems and medications, weight loss med management. for optimal routine care. This allows for progress monitoring and treatment adjustments. [x]  Preventive Care: Schedule your annual preventive care visit! It's typically covered by insurance and helps identify potential health issues early. [x]  Lab & X-ray Appointments: Incomplete tests scheduled today, or call to schedule. X-rays:  [facility] (M-F, 8:30am-noon or 1pm-5pm). [x]  Medical Information Release: Sign a release form at front  desk to obtain relevant medical information we  don't have.  MAKING THE MOST OF OUR FOCUSED 20 MINUTE APPOINTMENTS: [x]   Clearly state your top concerns at the beginning of the visit to focus our discussion [x]   If you anticipate you will need more time, please inform the front desk during scheduling - we can book multiple appointments in the same week. [x]   If you have transportation problems- use our convenient video appointments or ask about transportation support. [x]   We can get down to business faster if you use MyChart to update information before the visit and submit non-urgent questions before your visit. Thank you for taking the time to provide details through MyChart.  Let our nurse know and she can import this information into your encounter documents.  Arrival and Wait Times: [x]   Arriving on time ensures that everyone receives prompt attention. [x]   Early morning (8a) and afternoon (1p) appointments tend to have shortest wait times. [x]   Unfortunately, we cannot delay appointments for late arrivals or hold slots during phone calls.  Getting Answers and Following Up [x]   Simple Questions & Concerns: For quick questions or basic follow-up after your visit, reach us  at [phone] or MyChart messaging. [x]   Complex Concerns: If your concern is more complex, scheduling an appointment might be best. Discuss this with the staff to find the most suitable option. [x]   Lab & Imaging Results: We'll contact you directly if results are abnormal or you don't use MyChart. Most normal results will be on MyChart within 2-3 business days, with a review message from Dr. Jesus. Haven't heard back in 2 weeks? Need results sooner? Contact us  at [phone]. [x]   Referrals: Our referral coordinator will manage specialist referrals. The specialist's office should contact you within 2 weeks to schedule an appointment. Call us  if you haven't heard from them after 2 weeks.  Staying Connected [x]    MyChart: Activate your MyChart for the fastest way to access results and message us . See the last page of this paperwork for instructions on how to activate.  Bring to Your Next Appointment [x]   Medications: Please bring all your medication bottles to your next appointment to ensure we have an accurate record of your prescriptions. [x]   Health Diaries: If you're monitoring any health conditions at home, keeping a diary of your readings can be very helpful for discussions at your next appointment.  Billing [x]   X-ray & Lab Orders: These are billed by separate companies. Contact the invoicing company directly for questions or concerns. [x]   Visit Charges: Discuss any billing inquiries with our administrative services team.  Your Satisfaction Matters [x]   Share Your Experience: We strive for your satisfaction! If you have any complaints, or preferably compliments, please let Dr. Jesus know directly or contact our Practice Administrators, Manuelita Rubin or [employer], by asking at the front desk.   Reviewing Your Records [x]   Review this early draft of your clinical encounter notes below and the final encounter summary tomorrow on MyChart after its been completed.  All orders placed so far are visible here: Encounter for disability examination -     AMB Referral [facility] Care Management  Material hardship due to limited financial resources -     AMB Referral [facility] Care Management  Chronic diastolic heart failure (HCC) -     Empagliflozin ; Take 1 tablet (25 mg total) by mouth daily before breakfast.  Dispense: 90 tablet; Refill: 3 -     Kerendia ; Take 1 tablet (20 mg total) by mouth daily. Replaces spironolactone  if  approved, start at 10 mg for first 4 weeks (half tablets)  Dispense: 90 tablet; Refill: 1  Type 2 diabetes mellitus with other specified complication, without long-term current use of insulin  (HCC) -     Empagliflozin ; Take 1 tablet (25 mg total) by mouth daily before breakfast.   Dispense: 90 tablet; Refill: 3 -     Spironolactone ; Take 1 tablet (50 mg total) by mouth daily. Replaces 25 mg dose  Dispense: 90 tablet; Refill: 4  Morbid obesity with BMI of 50.0-59.9, adult (HCC)  Hypertension due to endocrine disorder  Diabetic nephropathy with proteinuria (HCC) -     Empagliflozin ; Take 1 tablet (25 mg total) by mouth daily before breakfast.  Dispense: 90 tablet; Refill: 3 -     Kerendia ; Take 1 tablet (20 mg total) by mouth daily. Replaces spironolactone  if approved, start at 10 mg for first 4 weeks (half tablets)  Dispense: 90 tablet; Refill: 1 -     CBC with Differential/Platelet -     Comprehensive metabolic panel with GFR -     Lipid panel -     Hemoglobin A1c -     Microalbumin / creatinine urine ratio  Vitamin D  deficiency -     VITAMIN D  25 Hydroxy (Vit-D Deficiency, Fractures) -     Vitamin D3; Take 1 capsule (5,000 Units total) by mouth daily.  Dispense: 90 capsule; Refill: 4  Elevated PSA

## 2024-07-17 NOTE — Progress Notes (Signed)
 ==============================  [facility]: [phone]   -- Medical Office Visit --  Patient: Jason Meyer      Age: 63 y.o.       Sex:  male  Date:   07/17/2024 Today's Healthcare Provider: Bernardino KANDICE Cone, MD  ==============================   Chief Complaint: Discuss Diability paper work  Discussed the use of AI scribe software for clinical note transcription with the patient, who gave verbal consent to proceed.  History of Present Illness 63 year old male who presents with financial difficulties affecting his medical care.  He has paperwork we complete today for contested long term disability.   He is experiencing significant financial difficulties impacting his ability to access necessary medical care. Since retiring, his insurance requires a $1,500 deductible for hospital services, which he cannot afford. He has already paid $250 for a visit and is unable to continue therapy sessions due to these costs. He was supposed to have a heart-related appointment, but the cost is prohibitive.  He is on long-term disability and has been approved for total permanent disability. Previously, he worked 25 hours a week, as recommended by his doctor. He is no longer working and is adjusting to a fixed income.  He takes amlodipine  for blood pressure, Lasix  for leg swelling, and Jardiance  for diabetes and heart failure. He also takes spironolactone  for fluid management and vitamin D  supplements. He was prescribed Kerendia  but is unsure if he can afford it.  He experiences anxiety and stress due to financial expenses and health conditions. He is trying to adjust to living on a fixed income and is concerned about skipping healthcare due to financial constraints.  He has an upcoming appointment with a urologist and has scheduled an eye doctor appointment. He takes vitamin D  supplements once a week.  Updated Problem List Entries: Problem  Material Hardship  Due to Limited Financial Resources    Background Reviewed: Problem List: has Type 2 diabetes mellitus with other specified complication (HCC); Hypertension; Morbid obesity with BMI of 50.0-59.9, adult (HCC); Mixed hyperlipidemia; Venous stasis; OA (osteoarthritis); DOE (dyspnea on exertion); Chronic diastolic heart failure (HCC); Venous stasis dermatitis of both lower extremities; OSA (obstructive sleep apnea); Erectile dysfunction; Low HDL (under 40); Elevated PSA; Coronary artery calcification; Coronary artery disease; On statin therapy; Chronic kidney disease, stage 3 unspecified (HCC); Vitamin D  deficiency; Vision problems; Encounter for disability examination; Thalassemia carrier; Diabetic nephropathy with proteinuria (HCC); and Material hardship due to limited financial resources on their problem list. Past Medical History:  has a past medical history of Allergy, Arthritis, CAP (community acquired pneumonia) (04/12/2023), Chronic diastolic heart failure (HCC) (90/98/7976), Coronary artery calcification (10/19/2022), Hyperlipidemia, Hypertension, OSA (obstructive sleep apnea) (10/19/2022), Sepsis (HCC) (04/12/2023), Sleep apnea, and Type 2 diabetes mellitus (HCC). Past Surgical History:   has a past surgical history that includes No past surgeries. Social History:   reports that he quit smoking about 16 years ago. His smoking use included cigarettes. He started smoking about 49 years ago. He has a 33 pack-year smoking history. He has never used smokeless tobacco. He reports that he does not currently use alcohol. He reports that he does not use drugs. Family History:  family history includes Alzheimer's disease in his mother; Heart attack in his brother and father; Heart disease in his father; Hypertension in his father; Stroke in his father. Allergies:  is allergic to penicillins.   Medication Reconciliation: Current Outpatient Medications on File Prior to Visit  Medication Sig  acetaminophen   (TYLENOL ) 325 MG tablet Take 2 tablets (650 mg total) by mouth every 6 (six) hours as needed for mild pain (or Fever >/= 101).   albuterol  (PROVENTIL ) (2.5 MG/3ML) 0.083% nebulizer solution Take 3 mLs (2.5 mg total) by nebulization every 6 (six) hours as needed for wheezing or shortness of breath.   albuterol  (VENTOLIN  HFA) 108 (90 Base) MCG/ACT inhaler Inhale 2 puffs into the lungs every 6 (six) hours as needed for wheezing or shortness of breath.   amLODipine -olmesartan  (AZOR ) 10-40 MG tablet Take 1 tablet by mouth daily. Replaces amlodipine  and olmesartan    ASPIRIN  EC 81 MG EC tablet Take 1 tablet (81 mg total) by mouth daily.   Azelastine  HCl 137 MCG/SPRAY SOLN Place 1 spray into the nose daily.   bisoprolol  (ZEBETA ) 10 MG tablet Take 2 tablets (20 mg total) by mouth daily.   buPROPion  (WELLBUTRIN  XL) 150 MG 24 hr tablet TAKE 1 TABLET BY MOUTH EVERY DAY   celecoxib  (CELEBREX ) 200 MG capsule TAKE 1 CAPSULE (200 MG TOTAL) BY MOUTH 2 (TWO) TIMES DAILY. REPLACES MELOXICAM    clotrimazole  (LOTRIMIN ) 1 % cream Apply topically daily.   clotrimazole -betamethasone  (LOTRISONE ) cream APPLY TO AFFECTED AREA TWICE A DAY   Evolocumab  (REPATHA  SURECLICK) 140 MG/ML SOAJ Inject 140 mg into the skin every 14 (fourteen) days.   famotidine -calcium  carbonate-magnesium hydroxide (PEPCID  COMPLETE) 10-800-165 MG chewable tablet Chew 1 tablet by mouth 2 (two) times daily as needed.   fluticasone  (FLONASE ) 50 MCG/ACT nasal spray Place 2 sprays into both nostrils daily.   fluticasone  furoate-vilanterol (BREO ELLIPTA ) 100-25 MCG/ACT AEPB Inhale 1 puff into the lungs daily.   furosemide  (LASIX ) 40 MG tablet Take 2 tablets (80 mg total) by mouth daily. Must take with spironolactone  to keep potassium up Reduce dose to just 40 mg daily if no fluid is accumulating.   metFORMIN  (GLUCOPHAGE ) 1000 MG tablet Take 1 tablet (1,000 mg total) by mouth 2 (two) times daily with a meal.   olmesartan  (BENICAR ) 40 MG tablet Take 1 tablet (40  mg total) by mouth daily.   potassium chloride  (MICRO-K ) 10 MEQ CR capsule TAKE 1 CAPSULE BY MOUTH EVERY DAY   rosuvastatin  (CRESTOR ) 40 MG tablet Take 1 tablet (40 mg total) by mouth daily. Replaces atorvastatin  which failed to reach Low Density Lipoprotein (LDL cholesterol) goal of 55, take only if repatha  not approved/available.   tirzepatide  (MOUNJARO ) 15 MG/0.5ML Pen Inject 15 mg into the skin once a week. Replaces 12.5 mg dose   No current facility-administered medications on file prior to visit.   Medications Discontinued During This Encounter  Medication Reason   amLODipine  (NORVASC ) 5 MG tablet    spironolactone  (ALDACTONE ) 25 MG tablet Reorder   Finerenone  (KERENDIA ) 20 MG TABS Reorder   empagliflozin  (JARDIANCE ) 25 MG TABS tablet Reorder   Vitamin D , Ergocalciferol , (DRISDOL ) 1.25 MG (50000 UNIT) CAPS capsule Completed Course     Physical Exam:    07/17/2024    8:43 AM 05/28/2024   10:22 AM 05/15/2024   10:28 AM  Vitals with BMI  Height 5' 11 5' 11 5' 11  Weight 361 lbs 13 oz 358 lbs 3 oz 361 lbs 6 oz  BMI 50.48 49.98 50.43  Systolic 140  140  Diastolic 78  80  Pulse 78  77  Vital signs reviewed.  Nursing notes reviewed. Weight trend reviewed. Physical Exam General Appearance:  No acute distress appreciable.   Well-groomed, healthy-appearing male.  Well proportioned with no abnormal fat distribution.  Good  muscle tone. Pulmonary:  Normal work of breathing at rest, no respiratory distress apparent. SpO2: 96 %  Musculoskeletal: All extremities are intact.  Neurological:  Awake, alert, oriented, and engaged.  No obvious focal neurological deficits or cognitive impairments.  Sensorium seems unclouded.   Speech is clear and coherent with logical content. Psychiatric:  Appropriate mood, pleasant and cooperative demeanor, thoughtful and engaged during the exam Severe truncal adiposity. Limited mobility, leg swelling.  Results:    05/15/2024   10:34 AM 05/09/2024   10:37 AM  01/14/2024    2:16 PM 11/14/2023    8:53 AM  PHQ 2/9 Scores  PHQ - 2 Score 0 0 0 0  PHQ- 9 Score 0 0     Results     Results for orders placed or performed in visit on 07/17/24  CBC with Differential/Platelet  Result Value Ref Range   WBC 8.7 4.0 - 10.5 K/uL   RBC 6.24 (H) 4.22 - 5.81 Mil/uL   Hemoglobin 15.9 13.0 - 17.0 g/dL   HCT 50.2 60.9 - 47.9 %   MCV 79.6 78.0 - 100.0 fl   MCHC 32.1 30.0 - 36.0 g/dL   RDW 82.0 (H) 88.4 - 84.4 %   Platelets 245.0 150.0 - 400.0 K/uL   Neutrophils Relative % 72.6 43.0 - 77.0 %   Lymphocytes Relative 18.5 12.0 - 46.0 %   Monocytes Relative 7.1 3.0 - 12.0 %   Eosinophils Relative 1.3 0.0 - 5.0 %   Basophils Relative 0.5 0.0 - 3.0 %   Neutro Abs 6.3 1.4 - 7.7 K/uL   Lymphs Abs 1.6 0.7 - 4.0 K/uL   Monocytes Absolute 0.6 0.1 - 1.0 K/uL   Eosinophils Absolute 0.1 0.0 - 0.7 K/uL   Basophils Absolute 0.0 0.0 - 0.1 K/uL  Comprehensive metabolic panel with GFR  Result Value Ref Range   Sodium 139 135 - 145 mEq/L   Potassium 4.8 3.5 - 5.1 mEq/L   Chloride 102 96 - 112 mEq/L   CO2 25 19 - 32 mEq/L   Glucose, Bld 85 70 - 99 mg/dL   BUN 16 6 - 23 mg/dL   Creatinine, Ser 8.60 0.40 - 1.50 mg/dL   Total Bilirubin 0.5 0.2 - 1.2 mg/dL   Alkaline Phosphatase 67 39 - 117 U/L   AST 17 0 - 37 U/L   ALT 19 0 - 53 U/L   Total Protein 7.4 6.0 - 8.3 g/dL   Albumin 4.2 3.5 - 5.2 g/dL   GFR 45.33 (L) >39.99 mL/min   Calcium  10.0 8.4 - 10.5 mg/dL  Lipid panel  Result Value Ref Range   Cholesterol 136 0 - 200 mg/dL   Triglycerides 13.9 0.0 - 149.0 mg/dL   HDL 57.19 >60.99 mg/dL   VLDL 82.7 0.0 - 59.9 mg/dL   LDL Cholesterol 76 0 - 99 mg/dL   Total CHOL/HDL Ratio 3    NonHDL 93.49   Hemoglobin A1c  Result Value Ref Range   Hgb A1c MFr Bld 6.7 (H) 4.6 - 6.5 %  Vitamin D  (25 hydroxy)  Result Value Ref Range   VITD 59.82 30.00 - 100.00 ng/mL   Office Visit on 07/17/2024  Component Date Value Ref Range Status   WBC 07/17/2024 8.7  4.0 - 10.5 K/uL  Final   RBC 07/17/2024 6.24 (H)  4.22 - 5.81 Mil/uL Final   Hemoglobin 07/17/2024 15.9  13.0 - 17.0 g/dL Final   HCT 91/92/7974 49.7  39.0 - 52.0 % Final  MCV 07/17/2024 79.6  78.0 - 100.0 fl Final   MCHC 07/17/2024 32.1  30.0 - 36.0 g/dL Final   RDW 91/92/7974 17.9 (H)  11.5 - 15.5 % Final   Platelets 07/17/2024 245.0  150.0 - 400.0 K/uL Final   Neutrophils Relative % 07/17/2024 72.6  43.0 - 77.0 % Final   Lymphocytes Relative 07/17/2024 18.5  12.0 - 46.0 % Final   Monocytes Relative 07/17/2024 7.1  3.0 - 12.0 % Final   Eosinophils Relative 07/17/2024 1.3  0.0 - 5.0 % Final   Basophils Relative 07/17/2024 0.5  0.0 - 3.0 % Final   Neutro Abs 07/17/2024 6.3  1.4 - 7.7 K/uL Final   Lymphs Abs 07/17/2024 1.6  0.7 - 4.0 K/uL Final   Monocytes Absolute 07/17/2024 0.6  0.1 - 1.0 K/uL Final   Eosinophils Absolute 07/17/2024 0.1  0.0 - 0.7 K/uL Final   Basophils Absolute 07/17/2024 0.0  0.0 - 0.1 K/uL Final   Sodium 07/17/2024 139  135 - 145 mEq/L Final   Potassium 07/17/2024 4.8  3.5 - 5.1 mEq/L Final   Chloride 07/17/2024 102  96 - 112 mEq/L Final   CO2 07/17/2024 25  19 - 32 mEq/L Final   Glucose, Bld 07/17/2024 85  70 - 99 mg/dL Final   BUN 91/92/7974 16  6 - 23 mg/dL Final   Creatinine, Ser 07/17/2024 1.39  0.40 - 1.50 mg/dL Final   Total Bilirubin 07/17/2024 0.5  0.2 - 1.2 mg/dL Final   Alkaline Phosphatase 07/17/2024 67  39 - 117 U/L Final   AST 07/17/2024 17  0 - 37 U/L Final   ALT 07/17/2024 19  0 - 53 U/L Final   Total Protein 07/17/2024 7.4  6.0 - 8.3 g/dL Final   Albumin 91/92/7974 4.2  3.5 - 5.2 g/dL Final   GFR 91/92/7974 54.66 (L)  >60.00 mL/min Final   Calcium  07/17/2024 10.0  8.4 - 10.5 mg/dL Final   Cholesterol 91/92/7974 136  0 - 200 mg/dL Final   Triglycerides 91/92/7974 86.0  0.0 - 149.0 mg/dL Final   HDL 91/92/7974 42.80  >39.00 mg/dL Final   VLDL 91/92/7974 17.2  0.0 - 40.0 mg/dL Final   LDL Cholesterol 07/17/2024 76  0 - 99 mg/dL Final   Total CHOL/HDL Ratio  07/17/2024 3   Final   NonHDL 07/17/2024 93.49   Final   Hgb A1c MFr Bld 07/17/2024 6.7 (H)  4.6 - 6.5 % Final   VITD 07/17/2024 59.82  30.00 - 100.00 ng/mL Final  Office Visit on 04/14/2024  Component Date Value Ref Range Status   Sodium 04/14/2024 139  135 - 145 mEq/L Final   Potassium 04/14/2024 4.2  3.5 - 5.1 mEq/L Final   Chloride 04/14/2024 105  96 - 112 mEq/L Final   CO2 04/14/2024 25  19 - 32 mEq/L Final   Glucose, Bld 04/14/2024 110 (H)  70 - 99 mg/dL Final   BUN 94/94/7974 13  6 - 23 mg/dL Final   Creatinine, Ser 04/14/2024 1.38  0.40 - 1.50 mg/dL Final   Total Bilirubin 04/14/2024 0.6  0.2 - 1.2 mg/dL Final   Alkaline Phosphatase 04/14/2024 63  39 - 117 U/L Final   AST 04/14/2024 15  0 - 37 U/L Final   ALT 04/14/2024 15  0 - 53 U/L Final   Total Protein 04/14/2024 6.5  6.0 - 8.3 g/dL Final   Albumin 94/94/7974 4.0  3.5 - 5.2 g/dL Final   GFR 94/94/7974 55.24 (L)  >  60.00 mL/min Final   Calcium  04/14/2024 9.0  8.4 - 10.5 mg/dL Final   VITD 94/94/7974 23.85 (L)  30.00 - 100.00 ng/mL Final   Creatinine, Urine 04/14/2024 156  20 - 320 mg/dL Final   Protein/Creat Ratio 04/14/2024 545 (H)  25 - 148 mg/g creat Final   Protein/Creatinine Ratio 04/14/2024 0.545 (H)  0.025 - 0.148 mg/mg creat Final   Total Protein, Urine 04/14/2024 85 (H)  5 - 25 mg/dL Final   PSA 94/94/7974 11.46 (H)  0.10 - 4.00 ng/mL Final   Hgb A1c MFr Bld 04/14/2024 6.5  4.6 - 6.5 % Final   WBC 04/14/2024 8.5  4.0 - 10.5 K/uL Final   RBC 04/14/2024 6.00 (H)  4.22 - 5.81 Mil/uL Final   Hemoglobin 04/14/2024 15.6  13.0 - 17.0 g/dL Final   HCT 94/94/7974 47.7  39.0 - 52.0 % Final   MCV 04/14/2024 79.5  78.0 - 100.0 fl Final   MCHC 04/14/2024 32.8  30.0 - 36.0 g/dL Final   RDW 94/94/7974 16.3 (H)  11.5 - 15.5 % Final   Platelets 04/14/2024 175.0  150.0 - 400.0 K/uL Final   Neutrophils Relative % 04/14/2024 72.9  43.0 - 77.0 % Final   Lymphocytes Relative 04/14/2024 17.2  12.0 - 46.0 % Final   Monocytes Relative  04/14/2024 6.2  3.0 - 12.0 % Final   Eosinophils Relative 04/14/2024 3.1  0.0 - 5.0 % Final   Basophils Relative 04/14/2024 0.6  0.0 - 3.0 % Final   Neutro Abs 04/14/2024 6.2  1.4 - 7.7 K/uL Final   Lymphs Abs 04/14/2024 1.5  0.7 - 4.0 K/uL Final   Monocytes Absolute 04/14/2024 0.5  0.1 - 1.0 K/uL Final   Eosinophils Absolute 04/14/2024 0.3  0.0 - 0.7 K/uL Final   Basophils Absolute 04/14/2024 0.1  0.0 - 0.1 K/uL Final  Office Visit on 01/14/2024  Component Date Value Ref Range Status   WBC 01/14/2024 9.1  4.0 - 10.5 K/uL Final   RBC 01/14/2024 5.99 (H)  4.22 - 5.81 Mil/uL Final   Hemoglobin 01/14/2024 15.4  13.0 - 17.0 g/dL Final   HCT 97/96/7974 47.8  39.0 - 52.0 % Final   MCV 01/14/2024 79.7  78.0 - 100.0 fl Final   MCHC 01/14/2024 32.2  30.0 - 36.0 g/dL Final   RDW 97/96/7974 17.8 (H)  11.5 - 15.5 % Final   Platelets 01/14/2024 220.0  150.0 - 400.0 K/uL Final   Neutrophils Relative % 01/14/2024 74.3  43.0 - 77.0 % Final   Lymphocytes Relative 01/14/2024 18.1  12.0 - 46.0 % Final   Monocytes Relative 01/14/2024 4.8  3.0 - 12.0 % Final   Eosinophils Relative 01/14/2024 1.9  0.0 - 5.0 % Final   Basophils Relative 01/14/2024 0.9  0.0 - 3.0 % Final   Neutro Abs 01/14/2024 6.7  1.4 - 7.7 K/uL Final   Lymphs Abs 01/14/2024 1.6  0.7 - 4.0 K/uL Final   Monocytes Absolute 01/14/2024 0.4  0.1 - 1.0 K/uL Final   Eosinophils Absolute 01/14/2024 0.2  0.0 - 0.7 K/uL Final   Basophils Absolute 01/14/2024 0.1  0.0 - 0.1 K/uL Final   Sodium 01/14/2024 140  135 - 145 mEq/L Final   Potassium 01/14/2024 4.3  3.5 - 5.1 mEq/L Final   Chloride 01/14/2024 103  96 - 112 mEq/L Final   CO2 01/14/2024 28  19 - 32 mEq/L Final   Glucose, Bld 01/14/2024 89  70 - 99 mg/dL Final   BUN  01/14/2024 14  6 - 23 mg/dL Final   Creatinine, Ser 01/14/2024 1.33  0.40 - 1.50 mg/dL Final   Total Bilirubin 01/14/2024 0.5  0.2 - 1.2 mg/dL Final   Alkaline Phosphatase 01/14/2024 67  39 - 117 U/L Final   AST 01/14/2024 16  0  - 37 U/L Final   ALT 01/14/2024 15  0 - 53 U/L Final   Total Protein 01/14/2024 7.1  6.0 - 8.3 g/dL Final   Albumin 97/96/7974 4.1  3.5 - 5.2 g/dL Final   GFR 97/96/7974 57.84 (L)  >60.00 mL/min Final   Calcium  01/14/2024 9.3  8.4 - 10.5 mg/dL Final   Cholesterol 97/96/7974 125  0 - 200 mg/dL Final   Triglycerides 97/96/7974 93.0  0.0 - 149.0 mg/dL Final   HDL 97/96/7974 40.10  >39.00 mg/dL Final   VLDL 97/96/7974 18.6  0.0 - 40.0 mg/dL Final   LDL Cholesterol 01/14/2024 66  0 - 99 mg/dL Final   Total CHOL/HDL Ratio 01/14/2024 3   Final   NonHDL 01/14/2024 84.77   Final   Hgb A1c MFr Bld 01/14/2024 6.3  4.6 - 6.5 % Final   Interpretation - HGBRFX 01/14/2024 see note   Final   Reviewed By: 01/14/2024 see note   Final   Hgb A 01/14/2024 97.5  >96.0 % Final   Fetal Hemoglobin Testing 01/14/2024 0.0  <2.0 % Final   Hemoglobin A2 - HGBRFX 01/14/2024 2.5  2.0 - 3.2 % Final   C-Z Electrophoresis 01/14/2024 Confirms   Final   RBC 01/14/2024 6.15 (H)  4.20 - 5.80 Mill/uL Final   Hemoglobin 01/14/2024 15.5  13.2 - 17.1 g/dL Final   HCT 97/96/7974 50.2 (H)  38.5 - 50.0 % Final   MCV 01/14/2024 81.6  80.0 - 100.0 fL Final   MCH 01/14/2024 25.2 (L)  27.0 - 33.0 pg Final   MCHC 01/14/2024 30.9 (L)  32.0 - 36.0 g/dL Final   RDW 97/96/7974 17.3 (H)  11.0 - 15.0 % Final   Ferritin 01/14/2024 55  24 - 380 ng/mL Final   VITD 01/14/2024 32.30  30.00 - 100.00 ng/mL Final   PTH 01/14/2024 35  16 - 77 pg/mL Final   PSA, Total 01/14/2024 11.7 (H)  < OR = 4.0 ng/mL Final   PSA, Free 01/14/2024 1.0  ng/mL Final   PSA, % Free 01/14/2024 NOT CALCULATED  >25 % (calc) Final   Magnesium 01/14/2024 1.7  1.5 - 2.5 mg/dL Final   Uric Acid, Serum 01/14/2024 5.4  4.0 - 7.8 mg/dL Final   Ferritin 97/96/7974 44.0  22.0 - 322.0 ng/mL Final   Vitamin B-12 01/14/2024 212  211 - 911 pg/mL Final   Summary Report 01/14/2024 see note   Final   Reviewed By: 01/14/2024 see note   Final   Results Recieved 01/14/2024 01/25/24    Final   Alpha-Thalassemia 01/14/2024 see note   Final  Office Visit on 10/05/2023  Component Date Value Ref Range Status   Vit D, 25-Hydroxy 10/05/2023 10 (L)  30 - 100 ng/mL Final   Uric Acid, Serum 10/05/2023 5.4  4.0 - 8.0 mg/dL Final   PTH 89/74/7975 13 (L)  16 - 77 pg/mL Final   Phosphorus 10/05/2023 3.2  2.5 - 4.5 mg/dL Final   Magnesium 89/74/7975 1.9  1.5 - 2.5 mg/dL Final   Glucose, Bld 89/74/7975 85  65 - 99 mg/dL Final   BUN 89/74/7975 14  7 - 25 mg/dL Final   Creat 89/74/7975 1.22  0.70 - 1.35 mg/dL Final   BUN/Creatinine Ratio 10/05/2023 SEE NOTE:  6 - 22 (calc) Final   Sodium 10/05/2023 141  135 - 146 mmol/L Final   Potassium 10/05/2023 4.6  3.5 - 5.3 mmol/L Final   Chloride 10/05/2023 102  98 - 110 mmol/L Final   CO2 10/05/2023 28  20 - 32 mmol/L Final   Calcium  10/05/2023 9.8  8.6 - 10.3 mg/dL Final  Office Visit on 09/07/2023  Component Date Value Ref Range Status   WBC 09/07/2023 9.5  3.8 - 10.8 Thousand/uL Final   RBC 09/07/2023 6.28 (H)  4.20 - 5.80 Million/uL Final   Hemoglobin 09/07/2023 15.6  13.2 - 17.1 g/dL Final   HCT 90/72/7975 49.5  38.5 - 50.0 % Final   MCV 09/07/2023 78.8 (L)  80.0 - 100.0 fL Final   MCH 09/07/2023 24.8 (L)  27.0 - 33.0 pg Final   MCHC 09/07/2023 31.5 (L)  32.0 - 36.0 g/dL Final   RDW 90/72/7975 14.8  11.0 - 15.0 % Final   Platelets 09/07/2023 270  140 - 400 Thousand/uL Final   MPV 09/07/2023 10.8  7.5 - 12.5 fL Final   Neutro Abs 09/07/2023 6,603  1,500 - 7,800 cells/uL Final   Lymphs Abs 09/07/2023 1,957  850 - 3,900 cells/uL Final   Absolute Monocytes 09/07/2023 751  200 - 950 cells/uL Final   Eosinophils Absolute 09/07/2023 133  15 - 500 cells/uL Final   Basophils Absolute 09/07/2023 57  0 - 200 cells/uL Final   Neutrophils Relative % 09/07/2023 69.5  % Final   Total Lymphocyte 09/07/2023 20.6  % Final   Monocytes Relative 09/07/2023 7.9  % Final   Eosinophils Relative 09/07/2023 1.4  % Final   Basophils Relative 09/07/2023  0.6  % Final   Glucose, Bld 09/07/2023 78  65 - 99 mg/dL Final   BUN 90/72/7975 14  7 - 25 mg/dL Final   Creat 90/72/7975 1.24  0.70 - 1.35 mg/dL Final   BUN/Creatinine Ratio 09/07/2023 SEE NOTE:  6 - 22 (calc) Final   Sodium 09/07/2023 140  135 - 146 mmol/L Final   Potassium 09/07/2023 4.0  3.5 - 5.3 mmol/L Final   Chloride 09/07/2023 106  98 - 110 mmol/L Final   CO2 09/07/2023 22  20 - 32 mmol/L Final   Calcium  09/07/2023 9.2  8.6 - 10.3 mg/dL Final   Total Protein 90/72/7975 6.7  6.1 - 8.1 g/dL Final   Albumin 90/72/7975 4.1  3.6 - 5.1 g/dL Final   Globulin 90/72/7975 2.6  1.9 - 3.7 g/dL (calc) Final   AG Ratio 09/07/2023 1.6  1.0 - 2.5 (calc) Final   Total Bilirubin 09/07/2023 0.4  0.2 - 1.2 mg/dL Final   Alkaline phosphatase (APISO) 09/07/2023 75  35 - 144 U/L Final   AST 09/07/2023 21  10 - 35 U/L Final   ALT 09/07/2023 24  9 - 46 U/L Final   Cholesterol 09/07/2023 113  <200 mg/dL Final   HDL 90/72/7975 37 (L)  > OR = 40 mg/dL Final   Triglycerides 90/72/7975 73  <150 mg/dL Final   LDL Cholesterol (Calc) 09/07/2023 61  mg/dL (calc) Final   Total CHOL/HDL Ratio 09/07/2023 3.1  <4.9 (calc) Final   Non-HDL Cholesterol (Calc) 09/07/2023 76  <130 mg/dL (calc) Final   Hgb J8r MFr Bld 09/07/2023 6.7 (H)  <5.7 % of total Hgb Final   Mean Plasma Glucose 09/07/2023 146  mg/dL Final   eAG (mmol/L) 90/72/7975 8.1  mmol/L Final   Creatinine, Urine 09/07/2023 218  20 - 320 mg/dL Final   Microalb, Ur 90/72/7975 8.5  mg/dL Final   Microalb Creat Ratio 09/07/2023 39 (H)  <30 mg/g creat Final  Office Visit on 05/08/2023  Component Date Value Ref Range Status   PSA 05/08/2023 11.53 (H)  0.10 - 4.00 ng/mL Final   Sodium 05/08/2023 139  135 - 145 mEq/L Final   Potassium 05/08/2023 3.9  3.5 - 5.1 mEq/L Final   Chloride 05/08/2023 105  96 - 112 mEq/L Final   CO2 05/08/2023 24  19 - 32 mEq/L Final   Glucose, Bld 05/08/2023 140 (H)  70 - 99 mg/dL Final   BUN 94/71/7975 13  6 - 23 mg/dL Final    Creatinine, Ser 05/08/2023 1.12  0.40 - 1.50 mg/dL Final   GFR 94/71/7975 71.43  >60.00 mL/min Final   Calcium  05/08/2023 9.0  8.4 - 10.5 mg/dL Final   WBC 94/71/7975 7.9  4.0 - 10.5 K/uL Final   RBC 05/08/2023 5.90 (H)  4.22 - 5.81 Mil/uL Final   Hemoglobin 05/08/2023 14.5  13.0 - 17.0 g/dL Final   HCT 94/71/7975 45.5  39.0 - 52.0 % Final   MCV 05/08/2023 77.1 (L)  78.0 - 100.0 fl Final   MCHC 05/08/2023 31.8  30.0 - 36.0 g/dL Final   RDW 94/71/7975 18.3 (H)  11.5 - 15.5 % Final   Platelets 05/08/2023 196.0  150.0 - 400.0 K/uL Final   Neutrophils Relative % 05/08/2023 73.8  43.0 - 77.0 % Final   Lymphocytes Relative 05/08/2023 18.4  12.0 - 46.0 % Final   Monocytes Relative 05/08/2023 5.3  3.0 - 12.0 % Final   Eosinophils Relative 05/08/2023 2.0  0.0 - 5.0 % Final   Basophils Relative 05/08/2023 0.5  0.0 - 3.0 % Final   Neutro Abs 05/08/2023 5.8  1.4 - 7.7 K/uL Final   Lymphs Abs 05/08/2023 1.4  0.7 - 4.0 K/uL Final   Monocytes Absolute 05/08/2023 0.4  0.1 - 1.0 K/uL Final   Eosinophils Absolute 05/08/2023 0.2  0.0 - 0.7 K/uL Final   Basophils Absolute 05/08/2023 0.0  0.0 - 0.1 K/uL Final   Ferritin 05/08/2023 38.7  22.0 - 322.0 ng/mL Final  No results displayed because visit has over 200 results.    Office Visit on 12/26/2022  Component Date Value Ref Range Status   WBC 12/26/2022 10.1  4.0 - 10.5 K/uL Final   RBC 12/26/2022 5.71  4.22 - 5.81 Mil/uL Final   Platelets 12/26/2022 331.0  150.0 - 400.0 K/uL Final   Hemoglobin 12/26/2022 14.3  13.0 - 17.0 g/dL Final   HCT 98/83/7975 44.1  39.0 - 52.0 % Final   MCV 12/26/2022 77.2 (L)  78.0 - 100.0 fl Final   MCHC 12/26/2022 32.5  30.0 - 36.0 g/dL Final   RDW 98/83/7975 17.2 (H)  11.5 - 15.5 % Final   Sodium 12/26/2022 141  135 - 145 mEq/L Final   Potassium 12/26/2022 4.3  3.5 - 5.1 mEq/L Final   Chloride 12/26/2022 103  96 - 112 mEq/L Final   CO2 12/26/2022 29  19 - 32 mEq/L Final   Glucose, Bld 12/26/2022 103 (H)  70 - 99 mg/dL  Final   BUN 98/83/7975 11  6 - 23 mg/dL Final   Creatinine, Ser 12/26/2022 1.17  0.40 - 1.50 mg/dL Final   Total Bilirubin 12/26/2022 0.5  0.2 - 1.2 mg/dL Final   Alkaline Phosphatase 12/26/2022 95  39 -  117 U/L Final   AST 12/26/2022 13  0 - 37 U/L Final   ALT 12/26/2022 12  0 - 53 U/L Final   Total Protein 12/26/2022 7.2  6.0 - 8.3 g/dL Final   Albumin 98/83/7975 4.0  3.5 - 5.2 g/dL Final   GFR 98/83/7975 67.96  >60.00 mL/min Final   Calcium  12/26/2022 9.5  8.4 - 10.5 mg/dL Final   Cholesterol 98/83/7975 139  0 - 200 mg/dL Final   Triglycerides 98/83/7975 74.0  0.0 - 149.0 mg/dL Final   HDL 98/83/7975 30.50 (L)  >60.99 mg/dL Final   VLDL 98/83/7975 14.8  0.0 - 40.0 mg/dL Final   LDL Cholesterol 12/26/2022 94  0 - 99 mg/dL Final   Total CHOL/HDL Ratio 12/26/2022 5   Final   NonHDL 12/26/2022 108.76   Final   MICRO NUMBER: 12/26/2022 [id number]   Final   SPECIMEN QUALITY: 12/26/2022 Adequate   Final   Sample Source 12/26/2022 URINE   Final   STATUS: 12/26/2022 FINAL   Final   ISOLATE 1: 12/26/2022 Klebsiella pneumoniae (A)   Final   Color, UA 12/26/2022 yellow   Final   Clarity, UA 12/26/2022 cloudy   Final   Glucose, UA 12/26/2022 Negative  Negative Final   Bilirubin, UA 12/26/2022 Negative   Final   Ketones, UA 12/26/2022 Negative   Final   Spec Grav, UA 12/26/2022 1.015  1.010 - 1.025 Final   Blood, UA 12/26/2022 Negative   Final   pH, UA 12/26/2022 5.0  5.0 - 8.0 Final   Protein, UA 12/26/2022 Negative  Negative Final   Urobilinogen, UA 12/26/2022 0.2  0.2 or 1.0 E.U./dL Final   Nitrite, UA 98/83/7975 Postive   Final   Leukocytes, UA 12/26/2022 Moderate (2+) (A)  Negative Final  Admission on 07/30/2022, Discharged on 08/01/2022  Component Date Value Ref Range Status   Sodium 07/30/2022 140  135 - 145 mmol/L Final   Potassium 07/30/2022 4.0  3.5 - 5.1 mmol/L Final   Chloride 07/30/2022 108  98 - 111 mmol/L Final   CO2 07/30/2022 25  22 - 32 mmol/L Final   Glucose, Bld  07/30/2022 117 (H)  70 - 99 mg/dL Final   BUN 91/79/7976 12  6 - 20 mg/dL Final   Creatinine, Ser 07/30/2022 1.00  0.61 - 1.24 mg/dL Final   Calcium  07/30/2022 8.9  8.9 - 10.3 mg/dL Final   GFR, Estimated 07/30/2022 >60  >60 mL/min Final   Anion gap 07/30/2022 7  5 - 15 Final   WBC 07/30/2022 12.5 (H)  4.0 - 10.5 K/uL Final   RBC 07/30/2022 5.62  4.22 - 5.81 MIL/uL Final   Hemoglobin 07/30/2022 14.4  13.0 - 17.0 g/dL Final   HCT 91/79/7976 45.3  39.0 - 52.0 % Final   MCV 07/30/2022 80.6  80.0 - 100.0 fL Final   MCH 07/30/2022 25.6 (L)  26.0 - 34.0 pg Final   MCHC 07/30/2022 31.8  30.0 - 36.0 g/dL Final   RDW 91/79/7976 17.2 (H)  11.5 - 15.5 % Final   Platelets 07/30/2022 269  150 - 400 K/uL Final   nRBC 07/30/2022 0.0  0.0 - 0.2 % Final   Neutrophils Relative % 07/30/2022 85  % Final   Neutro Abs 07/30/2022 10.5 (H)  1.7 - 7.7 K/uL Final   Lymphocytes Relative 07/30/2022 8  % Final   Lymphs Abs 07/30/2022 1.1  0.7 - 4.0 K/uL Final   Monocytes Relative 07/30/2022 5  % Final  Monocytes Absolute 07/30/2022 0.6  0.1 - 1.0 K/uL Final   Eosinophils Relative 07/30/2022 2  % Final   Eosinophils Absolute 07/30/2022 0.3  0.0 - 0.5 K/uL Final   Basophils Relative 07/30/2022 0  % Final   Basophils Absolute 07/30/2022 0.1  0.0 - 0.1 K/uL Final   Immature Granulocytes 07/30/2022 0  % Final   Abs Immature Granulocytes 07/30/2022 0.05  0.00 - 0.07 K/uL Final   Troponin I (High Sensitivity) 07/30/2022 7  <18 ng/L Final   B Natriuretic Peptide 07/30/2022 61.0  0.0 - 100.0 pg/mL Final   SARS Coronavirus 2 by RT PCR 07/30/2022 NEGATIVE  NEGATIVE Final   D-Dimer, Quant 07/30/2022 0.66 (H)  0.00 - 0.50 ug/mL-FEU Final   WBC 07/31/2022 11.5 (H)  4.0 - 10.5 K/uL Final   RBC 07/31/2022 5.47  4.22 - 5.81 MIL/uL Final   Hemoglobin 07/31/2022 14.0  13.0 - 17.0 g/dL Final   HCT 91/78/7976 44.3  39.0 - 52.0 % Final   MCV 07/31/2022 81.0  80.0 - 100.0 fL Final   MCH 07/31/2022 25.6 (L)  26.0 - 34.0 pg Final    MCHC 07/31/2022 31.6  30.0 - 36.0 g/dL Final   RDW 91/78/7976 17.2 (H)  11.5 - 15.5 % Final   Platelets 07/31/2022 303  150 - 400 K/uL Final   nRBC 07/31/2022 0.0  0.0 - 0.2 % Final   Sodium 07/31/2022 139  135 - 145 mmol/L Final   Potassium 07/31/2022 4.0  3.5 - 5.1 mmol/L Final   Chloride 07/31/2022 106  98 - 111 mmol/L Final   CO2 07/31/2022 26  22 - 32 mmol/L Final   Glucose, Bld 07/31/2022 166 (H)  70 - 99 mg/dL Final   BUN 91/78/7976 15  6 - 20 mg/dL Final   Creatinine, Ser 07/31/2022 1.04  0.61 - 1.24 mg/dL Final   Calcium  07/31/2022 9.1  8.9 - 10.3 mg/dL Final   GFR, Estimated 07/31/2022 >60  >60 mL/min Final   Anion gap 07/31/2022 7  5 - 15 Final   Weight 07/31/2022 6,246.95  oz Final   Height 07/31/2022 71  in Final   BP 07/31/2022 144/81  mmHg Final   AR max vel 07/31/2022 2.77  cm2 Final   AV Area VTI 07/31/2022 3.07  cm2 Final   AV Mean grad 07/31/2022 9.0  mmHg Final   AV Peak grad 07/31/2022 16.6  mmHg Final   Ao pk vel 07/31/2022 2.04  m/s Final   AV Area mean vel 07/31/2022 2.94  cm2 Final   Area-P 1/2 07/31/2022 3.08  cm2 Final   S' Lateral 07/31/2022 3.35  cm Final   Glucose-Capillary 07/30/2022 209 (H)  70 - 99 mg/dL Final   Glucose-Capillary 07/31/2022 205 (H)  70 - 99 mg/dL Final   Glucose-Capillary 07/31/2022 159 (H)  70 - 99 mg/dL Final   Glucose-Capillary 07/31/2022 175 (H)  70 - 99 mg/dL Final   Sodium 91/77/7976 140  135 - 145 mmol/L Final   Potassium 08/01/2022 3.4 (L)  3.5 - 5.1 mmol/L Final   Chloride 08/01/2022 108  98 - 111 mmol/L Final   CO2 08/01/2022 25  22 - 32 mmol/L Final   Glucose, Bld 08/01/2022 129 (H)  70 - 99 mg/dL Final   BUN 91/77/7976 17  6 - 20 mg/dL Final   Creatinine, Ser 08/01/2022 1.20  0.61 - 1.24 mg/dL Final   Calcium  08/01/2022 8.8 (L)  8.9 - 10.3 mg/dL Final   GFR, Estimated 08/01/2022 >  60  >60 mL/min Final   Anion gap 08/01/2022 7  5 - 15 Final   Magnesium 08/01/2022 2.2  1.7 - 2.4 mg/dL Final   WBC 91/77/7976 12.3 (H)   4.0 - 10.5 K/uL Final   RBC 08/01/2022 5.36  4.22 - 5.81 MIL/uL Final   Hemoglobin 08/01/2022 13.7  13.0 - 17.0 g/dL Final   HCT 91/77/7976 43.6  39.0 - 52.0 % Final   MCV 08/01/2022 81.3  80.0 - 100.0 fL Final   MCH 08/01/2022 25.6 (L)  26.0 - 34.0 pg Final   MCHC 08/01/2022 31.4  30.0 - 36.0 g/dL Final   RDW 91/77/7976 17.1 (H)  11.5 - 15.5 % Final   Platelets 08/01/2022 298  150 - 400 K/uL Final   nRBC 08/01/2022 0.0  0.0 - 0.2 % Final   Glucose-Capillary 07/31/2022 123 (H)  70 - 99 mg/dL Final   Glucose-Capillary 07/31/2022 131 (H)  70 - 99 mg/dL Final   Glucose-Capillary 08/01/2022 137 (H)  70 - 99 mg/dL Final   Glucose-Capillary 08/01/2022 121 (H)  70 - 99 mg/dL Final   Comment 1 91/77/7976 Notify RN   Final   Comment 2 08/01/2022 Document in Chart   Final  No image results found. XR KNEE 3 VIEW LEFT Result Date: 05/28/2024 AP, lateral, sunrise views of left knee reviewed.  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  Varus alignment noted.  No fracture or dislocation.  No abnormal patellar height.  XR KNEE 3 VIEW RIGHT Result Date: 05/28/2024 AP, lateral, sunrise views of right knee reviewed.  Severe end-stage tricompartmental arthritis noted worst in the medial compartment.  No fracture or dislocation.  No significant abnormal patellar height.        ASSESSMENT & PLAN   Assessment & Plan Encounter for disability examination Completed and faxed copy for long term disability. Material hardship due to limited financial resources Financial difficulties due to insurance changes after retirement are leading to high out-of-pocket costs for medical care, causing him to forego necessary treatments. He is unable to afford the $250 per visit deductible for hospital-based therapy and is considering switching to clinic-based care to reduce costs. Refer to a Child psychotherapist for assistance with medical bills, utilities, housing, and transportation costs.  Assist with navigating insurance changes and a potential switch to Medicare. Explore options for reducing healthcare costs and ensuring access to necessary treatments. Chronic diastolic heart failure (HCC) Heart failure is managed with medications for fluid retention and blood pressure. Medication adjustments are under consideration to optimize treatment and reduce side effects. Spironolactone  may be increased to 50 mg daily if Kerendia  is not approved. Kerendia  is preferred due to fewer side effects, but its high cost and potential lack of insurance coverage are concerns. Continue Lasix  80 mg daily for fluid management. Attempt prior authorization for Kerendia  20 mg daily. Continue Jardiance  for heart failure, kidney protection, and diabetes management. Diabetic nephropathy with proteinuria (HCC) Chronic kidney disease is managed with medications to protect kidney function. Medication adjustments are being considered to optimize kidney protection. If approved, Kerendia  would provide additional kidney protection and aid in fluid management. Continue Jardiance  and attempt prior authorization for Kerendia . Monitor kidney function. Type 2 diabetes mellitus with other specified complication, without long-term current use of insulin  (HCC) Diabetes mellitus is managed with Jardiance  and other medications. Monitoring the condition and ensuring medication adherence is crucial. Jardiance  provides benefits for heart failure, kidney protection, and diabetes management. Continue Jardiance  and monitor blood glucose  levels, adjusting medications as needed. Morbid obesity with BMI of 50.0-59.9, adult (HCC) Encouraged weight loss  Hypertension due to endocrine disorder Hypertension is managed with amlodipine  and other medications. Adjustments are being considered to improve blood pressure control. If approved, Kerendia  may also aid in managing blood pressure. Continue amlodipine  and monitor blood pressure, adjusting  medications as needed. Vitamin D  deficiency Will order lab testing to guide management.  Elevated PSA Elevated PSA levels are awaiting urology consultation for further evaluation. Ensure follow-up with a urologist and monitor PSA levels. He is due for routine health maintenance, including eye exams and foot exams. Vitamin D  supplementation is necessary due to dietary insufficiency. Schedule an eye exam and perform a foot exam at the next visit. Check vitamin D  levels and prescribe daily vitamin D  supplementation.  Follow-up is required to monitor health conditions, medication adjustments, and social work support. Ensure a smooth transition to new insurance and financial arrangements. Schedule a follow-up appointment in one month to review problems, medications, weight loss, and chronic disease management. Ensure social worker contact and support. Perform blood work at the next visit.  ORDER ASSOCIATIONS  #   DIAGNOSIS / CONDITION ICD-10 ENCOUNTER ORDER     ICD-10-CM   1. Encounter for disability examination  Z02.71 AMB Referral [facility] Care Management    2. Material hardship due to limited financial resources  Z59.87 AMB Referral [facility] Care Management    3. Chronic diastolic heart failure (HCC)  P49.67 empagliflozin  (JARDIANCE ) 25 MG TABS tablet    Finerenone  (KERENDIA ) 20 MG TABS    4. Type 2 diabetes mellitus with other specified complication, without long-term current use of insulin  (HCC)  E11.69 empagliflozin  (JARDIANCE ) 25 MG TABS tablet    spironolactone  (ALDACTONE ) 50 MG tablet    5. Morbid obesity with BMI of 50.0-59.9, adult (HCC)  E66.01    Z68.43     6. Hypertension due to endocrine disorder  I15.2     7. Diabetic nephropathy with proteinuria (HCC)  E11.21 empagliflozin  (JARDIANCE ) 25 MG TABS tablet    Finerenone  (KERENDIA ) 20 MG TABS    CBC with Differential/Platelet    Comprehensive metabolic panel with GFR    Lipid panel    Hemoglobin A1c    Microalbumin / creatinine urine  ratio    8. Vitamin D  deficiency  E55.9 Vitamin D  (25 hydroxy)    Cholecalciferol (VITAMIN D3) 125 MCG (5000 UT) CAPS     Meds ordered this encounter  Medications   empagliflozin  (JARDIANCE ) 25 MG TABS tablet    Sig: Take 1 tablet (25 mg total) by mouth daily before breakfast.    Dispense:  90 tablet    Refill:  3   Finerenone  (KERENDIA ) 20 MG TABS    Sig: Take 1 tablet (20 mg total) by mouth daily. Replaces spironolactone  if approved, start at 10 mg for first 4 weeks (half tablets)    Dispense:  90 tablet    Refill:  1   spironolactone  (ALDACTONE ) 50 MG tablet    Sig: Take 1 tablet (50 mg total) by mouth daily. Replaces 25 mg dose    Dispense:  90 tablet    Refill:  4   Cholecalciferol (VITAMIN D3) 125 MCG (5000 UT) CAPS    Sig: Take 1 capsule (5,000 Units total) by mouth daily.    Dispense:  90 capsule    Refill:  4       This document was synthesized by artificial intelligence ([employer]bridge) using HIPAA-compliant recording of the clinical  interaction;   We discussed the use of AI scribe software for clinical note transcription with the patient, who gave verbal consent to proceed. additional Info: This encounter employed state-of-the-art, real-time, collaborative documentation. The patient actively reviewed and assisted in updating their electronic medical record on a shared screen, ensuring transparency and facilitating joint problem-solving for the problem list, overview, and plan. This approach promotes accurate, informed care. The treatment plan was discussed and reviewed in detail, including medication safety, potential side effects, and all patient questions. We confirmed understanding and comfort with the plan. Follow-up instructions were established, including contacting the office for any concerns, returning if symptoms worsen, persist, or new symptoms develop, and precautions for potential emergency department visits.

## 2024-07-17 NOTE — Assessment & Plan Note (Signed)
 Heart failure is managed with medications for fluid retention and blood pressure. Medication adjustments are under consideration to optimize treatment and reduce side effects. Spironolactone  may be increased to 50 mg daily if Kerendia  is not approved. Kerendia  is preferred due to fewer side effects, but its high cost and potential lack of insurance coverage are concerns. Continue Lasix  80 mg daily for fluid management. Attempt prior authorization for Kerendia  20 mg daily. Continue Jardiance  for heart failure, kidney protection, and diabetes management.

## 2024-07-17 NOTE — Assessment & Plan Note (Signed)
 Diabetes mellitus is managed with Jardiance  and other medications. Monitoring the condition and ensuring medication adherence is crucial. Jardiance  provides benefits for heart failure, kidney protection, and diabetes management. Continue Jardiance  and monitor blood glucose levels, adjusting medications as needed.

## 2024-07-17 NOTE — Assessment & Plan Note (Signed)
 Chronic kidney disease is managed with medications to protect kidney function. Medication adjustments are being considered to optimize kidney protection. If approved, Kerendia  would provide additional kidney protection and aid in fluid management. Continue Jardiance  and attempt prior authorization for Kerendia . Monitor kidney function.

## 2024-07-17 NOTE — Assessment & Plan Note (Signed)
 Hypertension is managed with amlodipine  and other medications. Adjustments are being considered to improve blood pressure control. If approved, Kerendia  may also aid in managing blood pressure. Continue amlodipine  and monitor blood pressure, adjusting medications as needed.

## 2024-07-17 NOTE — Assessment & Plan Note (Signed)
 Will order lab testing to guide management.

## 2024-07-17 NOTE — Assessment & Plan Note (Signed)
 Financial difficulties due to insurance changes after retirement are leading to high out-of-pocket costs for medical care, causing him to forego necessary treatments. He is unable to afford the $250 per visit deductible for hospital-based therapy and is considering switching to clinic-based care to reduce costs. Refer to a Child psychotherapist for assistance with medical bills, utilities, housing, and transportation costs. Assist with navigating insurance changes and a potential switch to Medicare. Explore options for reducing healthcare costs and ensuring access to necessary treatments.

## 2024-07-17 NOTE — Assessment & Plan Note (Signed)
 Elevated PSA levels are awaiting urology consultation for further evaluation. Ensure follow-up with a urologist and monitor PSA levels.

## 2024-07-17 NOTE — Assessment & Plan Note (Signed)
 Encouraged weight loss

## 2024-07-18 ENCOUNTER — Telehealth: Payer: Self-pay | Admitting: *Deleted

## 2024-07-18 LAB — MICROALBUMIN / CREATININE URINE RATIO
Creatinine,U: 176.2 mg/dL
Microalb Creat Ratio: 21.7 mg/g (ref 0.0–30.0)
Microalb, Ur: 3.8 mg/dL — ABNORMAL HIGH (ref 0.0–1.9)

## 2024-07-18 NOTE — Progress Notes (Signed)
 Complex Care Management Note Care Guide Note  07/18/2024 Name: Jason Meyer MRN: [id number] DOB: 1961/09/25   Complex Care Management Outreach Attempts: An unsuccessful telephone outreach was attempted today to offer the patient information about available complex care management services.  Follow Up Plan:  Additional outreach attempts will be made to offer the patient complex care management information and services.   Encounter Outcome:  No Answer  Thedford Franks, CMA [place]  [facility], [facility] Guide Direct Dial: [phone]  Fax: [phone] Website: [email or web address]

## 2024-07-18 NOTE — Progress Notes (Signed)
 Complex Care Management Note  Care Guide Note 07/18/2024 Name: [name] MRN: [id number] DOB: 12/16/1960  Jason Meyer is a 63 y.o. year old male who sees Jesus Bernardino MATSU, MD for primary care. I reached out to Jason Meyer by phone today to offer complex care management services.  [name] was given information about Complex Care Management services today including:   The Complex Care Management services include support from the care team which includes your Nurse Care Manager, Clinical Social Worker, or Pharmacist.  The Complex Care Management team is here to help remove barriers to the health concerns and goals most important to you. Complex Care Management services are voluntary, and the patient may decline or stop services at any time by request to their care team member.   Complex Care Management Consent Status: Patient agreed to services and verbal consent obtained.   Follow up plan:  Telephone appointment with complex care management team member scheduled for:  07/28/2024  Encounter Outcome:  Patient Scheduled  Thedford Franks, CMA [place]  [facility], [facility] Guide Direct Dial: [phone]  Fax: [phone] Website: [email or web address]

## 2024-07-23 ENCOUNTER — Ambulatory Visit: Attending: Internal Medicine | Admitting: Physical Therapy

## 2024-07-23 ENCOUNTER — Encounter: Payer: Self-pay | Admitting: Physical Therapy

## 2024-07-23 DIAGNOSIS — M6281 Muscle weakness (generalized): Secondary | ICD-10-CM | POA: Insufficient documentation

## 2024-07-23 DIAGNOSIS — R2689 Other abnormalities of gait and mobility: Secondary | ICD-10-CM | POA: Insufficient documentation

## 2024-07-23 DIAGNOSIS — M25562 Pain in left knee: Secondary | ICD-10-CM | POA: Insufficient documentation

## 2024-07-23 DIAGNOSIS — G8929 Other chronic pain: Secondary | ICD-10-CM | POA: Diagnosis present

## 2024-07-23 DIAGNOSIS — M25561 Pain in right knee: Secondary | ICD-10-CM | POA: Diagnosis present

## 2024-07-23 NOTE — Therapy (Signed)
 OUTPATIENT PHYSICAL THERAPY LOWER EXTREMITY EVALUATION   Patient Name: Jason Meyer MRN: [id number] DOB:[birth year], 63 y.o., male Today's Date: 07/23/2024  END OF SESSION:  PT End of Session - 07/23/24 1530     Visit Number 2    Number of Visits 12    Date for PT Re-Evaluation 08/29/24    Authorization Type aetna    PT Start Time 1520    PT Stop Time 1600    PT Time Calculation (min) 40 min    Activity Tolerance Patient tolerated treatment well    Behavior During Therapy [facility] for tasks assessed/performed           Past Medical History:  Diagnosis Date   Allergy    Arthritis    CAP (community acquired pneumonia) 04/12/2023   Chronic diastolic heart failure (HCC) 08/11/2022   Admitted with acute hypoxemic respiratory failure in the setting of acute diastolic CHF exacerbation      Coronary artery calcification 10/19/2022   Noted on CTA  which also noted Cardiac enlargement, bilateral pleural effusions and diffuse ground-glass attenuation suggestive of mild CHF. 07/2022 referral to cardiology   Hyperlipidemia    Hypertension    OSA (obstructive sleep apnea) 10/19/2022   Sepsis (HCC) 04/12/2023   Sleep apnea    Type 2 diabetes mellitus (HCC)    Past Surgical History:  Procedure Laterality Date   NO PAST SURGERIES     Patient Active Problem List   Diagnosis Date Noted   Material hardship due to limited financial resources 07/17/2024   Diabetic nephropathy with proteinuria (HCC) 05/15/2024   Vision problems 11/14/2023   Encounter for disability examination 11/14/2023   Vitamin D  deficiency 10/06/2023   Chronic kidney disease, stage 3 unspecified (HCC) 10/05/2023   On statin therapy 05/08/2023   Thalassemia carrier 12/27/2022   Coronary artery disease 12/26/2022   OSA (obstructive sleep apnea) 10/19/2022   Erectile dysfunction 10/19/2022   Low HDL (under 40) 10/19/2022   Elevated PSA 10/19/2022   Coronary artery calcification 10/19/2022   Chronic diastolic  heart failure (HCC) 90/98/7976   Venous stasis dermatitis of both lower extremities 08/11/2022   DOE (dyspnea on exertion) 06/22/2021   OA (osteoarthritis) 11/23/2015   Venous stasis 09/20/2015   Type 2 diabetes mellitus with other specified complication (HCC) 04/23/2013   Hypertension 04/23/2013   Morbid obesity with BMI of 50.0-59.9, adult (HCC) 04/23/2013   Mixed hyperlipidemia 04/23/2013    PCP: Bernardino Cone MD  REFERRING PROVIDER: Shirly Carlin LITTIE, PA-C   REFERRING DIAG: M25.561,M25.562,G89.29 (ICD-10-CM) - Bilateral chronic knee pain   THERAPY DIAG:  Bilateral chronic knee pain  Muscle weakness (generalized)  Other abnormalities of gait and mobility  Rationale for Evaluation and Treatment: Rehabilitation  ONSET DATE: chronic  SUBJECTIVE:   SUBJECTIVE STATEMENT: He had PT evaluation for Aquatic PT and therapist thought he would be better for candidate for land based therapy so he was transferred to our clinic today.  PERTINENT HISTORY: BMI 50; CHF Water therapy-bilateral knee OA  PAIN:  Are you having pain? Yes: NPRS scale: current 6/10 Pain location: Bilat knees L>R Pain description: achy Aggravating factors: standing > 2 hours; sitting > 2hours Relieving factors: moving positions, knee brace; tylonol 650 and celebrex   PRECAUTIONS: None  RED FLAGS: None   WEIGHT BEARING RESTRICTIONS: No  FALLS:  Has patient fallen in last 6 months? No  LIVING ENVIRONMENT: Lives with: lives with their family Lives in: House/apartment Stairs: No Has following equipment at home: None  OCCUPATION:  retired  PLOF: Independent  PATIENT GOALS: improve strength and flexability, decrease pain 1-2/10, return to gym  NEXT MD VISIT: tomorrow  OBJECTIVE:  Note: Objective measures were completed at Evaluation unless otherwise noted.  DIAGNOSTIC FINDINGS: Bialt knee oa  PATIENT SURVEYS:  LEFS  Extreme difficulty/unable (0), Quite a bit of difficulty (1), Moderate  difficulty (2), Little difficulty (3), No difficulty (4) Survey date:  07/16/24  Any of your usual work, housework or school activities 3  2. Usual hobbies, recreational or sporting activities 3  3. Getting into/out of the bath 2  4. Walking between rooms 3  5. Putting on socks/shoes 1  6. Squatting  4  7. Lifting an object, like a bag of groceries from the floor 3  8. Performing light activities around your home 2  9. Performing heavy activities around your home 2  10. Getting into/out of a car 3  11. Walking 2 blocks 1  12. Walking 1 mile 1  13. Going up/down 10 stairs (1 flight) 0  14. Standing for 1 hour 2  15.  sitting for 1 hour 3  16. Running on even ground 1  17. Running on uneven ground 0  18. Making sharp turns while running fast 0  19. Hopping  2  20. Rolling over in bed 3  Score total:  37/40     COGNITION: Overall cognitive status: Within functional limits for tasks assessed     SENSATION: WFL  EDEMA:  slight  MUSCLE LENGTH: Hamstrings: tight bilaterally tested in sittin   POSTURE: Bilateral varus deformity  PALPATION: TTP medial joint lines bilaterally  LOWER EXTREMITY strength:  Strength in Lbs HD  Right eval Left eval  Hip flexion 48.9 49.3  Hip extension    Hip abduction    Hip adduction    Hip internal rotation    Hip external rotation    Knee flexion    Knee extension 65.1 62.1  Ankle dorsiflexion    Ankle plantarflexion    Ankle inversion    Ankle eversion     (Blank rows = not tested)  LOWER EXTREMITY ROM:  AAROM Right eval Left eval  Hip flexion    Hip extension    Hip abduction    Hip adduction    Hip internal rotation    Hip external rotation    Knee flexion 120 aarom 108 aarom  Knee extension -5 -8  Ankle dorsiflexion    Ankle plantarflexion    Ankle inversion    Ankle eversion     (Blank rows = not tested)  LOWER EXTREMITY SPECIAL TESTS: 48 Negative Homans' sign bilaterally.   Stable to anterior and posterior  drawer sign bilaterally. Stable to varus and valgus stress.   FUNCTIONAL TESTS:  Timed up and go (TUG): 12.97  4 stage balance: passed 1&2;  Tandem x 3s; SLS unable  5 x STS: 19.46 from pool bench  GAIT: Distance walked: 500 ft Assistive device utilized: None Level of assistance: Complete Independence Comments: Initiation of gait antalgic bilaterally which improves slightly with distance.  Increase lateral displacement with decreased knee and hip flex.  TREATMENT  07/23/24 Nu step L1 seat #12, X 15 min Seated hamstring stretch 30 sec X 2 bilat Seated knee flexion AAROM 5 sec X 10 Seated SLR 2X10 bilat Seated LAQ blue 2X10 bilat Standing mini squats X 10    PATIENT EDUCATION:  Education details: Discussed eval findings, rehab rationale, aquatic program progression/POC and pools in area. Patient is in agreement  Person educated: Patient Education method: Explanation Education comprehension: verbalized understanding  HOME EXERCISE PROGRAM: Access Code: [email or web address] URL: [email or web address] Date: 07/23/2024 Prepared by: Redell Moose  Exercises - Seated Hamstring Stretch  - 2 x daily - 6 x weekly - 1 sets - 3 reps - 30 sec hold - Seated Knee Flexion AAROM  - 2 x daily - 6 x weekly - 1 sets - 10 reps - 5 sec hold - Seated Straight Leg Raise with Quad Contraction  - 2 x daily - 6 x weekly - 2 sets - 10 reps - Seated Knee Extension with Resistance  - 2 x daily - 6 x weekly - 2 sets - 10 reps - Mini Squat with Counter Support  - 2 x daily - 6 x weekly - 1 sets - 10 reps  ASSESSMENT:  CLINICAL IMPRESSION: Session focused on HEP creation and review with demo, cuing, and education provided. He showed good overall understanding and return demonstration for these.    OBJECTIVE IMPAIRMENTS: Abnormal gait, decreased balance, decreased endurance,  decreased knowledge of use of DME, decreased mobility, difficulty walking, decreased ROM, decreased strength, impaired flexibility, postural dysfunction, obesity, and pain.   ACTIVITY LIMITATIONS: squatting, stairs, transfers, and locomotion level  PARTICIPATION LIMITATIONS: shopping, community activity, occupation, and yard work  PERSONAL FACTORS: 1-2 comorbidities: morbid obesity and CHF are also affecting patient's functional outcome.   REHAB POTENTIAL: Good  CLINICAL DECISION MAKING: Stable/uncomplicated  EVALUATION COMPLEXITY: Low   GOALS: Goals reviewed with patient? Yes  SHORT TERM GOALS: Target date: 08/11/24 Pt will tolerate PT sessions without excessive pain or fatigue.  Baseline: Goal status: INITIAL  2.  Pt will maintain normal O2 sats throughout therapy. Baseline: CHF Goal status: INITIAL  3.  Pt to be compliant with initial HEP/begin consistent exercise regimen. Baseline:  Goal status: INITIAL   LONG TERM GOALS: Target date: 08/29/24  Pt to improve on LEFS by at least 9 point to demonstrate statistically significant Improvement in function. Baseline: 37/40 Goal status: INITIAL  2.  Pt to increase knee extension bilaterally to 0d and flex to 120d Baseline: see chart Goal status: INITIAL  3.  Pt will improve on 5 X STS test to <or=  15s  to demonstrate improving functional lower extremity strength, transitional movements, and balance. (MDC = 4.2sec)  Baseline: 19.46 Goal status: INITIAL  4.  Pt will perform SLS x 20s to demonstrate a decrease in fall risk. Baseline: see chart Goal status: INITIAL  5.  Pt will be indep with final HEP for continued management of condition Baseline:  Goal status: INITIAL  6. Pt will report decrease in pain by at least 50% for improved toleration to activity/quality of life and to demonstrate improved management of pain.  Baseline: see chart  Goal Status: INITIAL   PLAN:  PT FREQUENCY: 2x/week  PT DURATION: 6  weeks  PLANNED INTERVENTIONS: 97164- PT Re-evaluation, 97750- Physical Performance Testing, 97110-Therapeutic exercises, 97530- Therapeutic activity, [id number]- Neuromuscular re-education, 97535- Self Care, 02859- Manual therapy, [id number]- Gait training, [id number]- Aquatic Therapy, [phone]- Electrical stimulation (manual), [id number]- Ionotophoresis 4mg /ml Dexamethasone , 79439 (1-2 muscles), 20561 (3+ muscles)-  Dry Needling, Patient/Family education, Balance training, Stair training, Taping, Joint mobilization, DME instructions, Cryotherapy, and Moist heat  PLAN FOR NEXT SESSION: bilat knee ROM and strengthening; proprioception and balance retraining, gait   Redell Moose, PT, DPT 07/23/24 4:04 PM  [facility] [address] [place], [place], [address] Phone: [phone]   Fax:  [phone]

## 2024-07-28 ENCOUNTER — Other Ambulatory Visit: Payer: Self-pay | Admitting: Licensed Clinical Social Worker

## 2024-07-28 NOTE — Patient Outreach (Signed)
 Complex Care Management   Visit Note  07/28/2024  Name:  Jason Meyer MRN: [id number] DOB: 09/07/1961  Situation: Referral received for Complex Care Management related to SDOH Barriers:  Financial Resource Strain I obtained verbal consent from Patient.  Visit completed with patient  on the phone  Background:   Past Medical History:  Diagnosis Date   Allergy    Arthritis    CAP (community acquired pneumonia) 04/12/2023   Chronic diastolic heart failure (HCC) 08/11/2022   Admitted with acute hypoxemic respiratory failure in the setting of acute diastolic CHF exacerbation      Coronary artery calcification 10/19/2022   Noted on CTA  which also noted Cardiac enlargement, bilateral pleural effusions and diffuse ground-glass attenuation suggestive of mild CHF. 07/2022 referral to cardiology   Hyperlipidemia    Hypertension    OSA (obstructive sleep apnea) 10/19/2022   Sepsis (HCC) 04/12/2023   Sleep apnea    Type 2 diabetes mellitus (HCC)     Assessment: Patient Reported Symptoms:  Cognitive Cognitive Status: Alert and oriented to person, place, and time, Normal speech and language skills, Insightful and able to interpret abstract concepts Cognitive/Intellectual Conditions Management [RPT]: None reported or documented in medical history or problem list   Health Maintenance Behaviors: Annual physical exam, Stress management, Social activities  Neurological Neurological Review of Symptoms: No symptoms reported Neurological Management Strategies: Activity, Routine screening Neurological Self-Management Outcome: 4 (good)  HEENT HEENT Symptoms Reported: No symptoms reported      Cardiovascular Cardiovascular Symptoms Reported: Other: Other Cardiovascular Symptoms: Patient has referral for Cardiology to be followed for this Does patient have uncontrolled Hypertension?: Yes Is patient checking Blood Pressure at home?: No Cardiovascular Management Strategies: Activity, Medication  therapy, Routine screening Cardiovascular Self-Management Outcome: 3 (uncertain)  Respiratory Respiratory Symptoms Reported: Shortness of breath Other Respiratory Symptoms: Being followed by provider for this Respiratory Management Strategies: Activity, Routine screening, Medication therapy  Endocrine Endocrine Symptoms Reported: No symptoms reported Is patient diabetic?: Yes Is patient checking blood sugars at home?: No Endocrine Self-Management Outcome: 4 (good) Endocrine Comment: Reports his diabetes is well controled with medication  Gastrointestinal Gastrointestinal Symptoms Reported: No symptoms reported      Genitourinary Genitourinary Symptoms Reported: No symptoms reported    Integumentary Integumentary Symptoms Reported: No symptoms reported    Musculoskeletal Musculoskelatal Symptoms Reviewed: No symptoms reported Musculoskeletal Management Strategies: Activity, Routine screening      Psychosocial Psychosocial Symptoms Reported: No symptoms reported Additional Psychological Details: Reports some symptoms of depression while going through disability process - this has improved with disability approval and reduction in financial strain. Behavioral Management Strategies: Medication therapy, Coping strategies Behavioral Health Self-Management Outcome: 4 (good)   Quality of Family Relationships: helpful, involved, supportive Do you feel physically threatened by others?: No      07/28/2024    9:42 AM  Depression screen PHQ 2/9  Decreased Interest 0  Down, Depressed, Hopeless 0  PHQ - 2 Score 0    There were no vitals filed for this visit.  Medications Reviewed Today     Reviewed by Kit Alm LABOR, LCSW (Social Worker) on 07/28/24 at [phone]  Med List Status: <None>   Medication Order Taking? Sig Documenting Provider Last Dose Status Informant  acetaminophen  (TYLENOL ) 325 MG tablet [id number] Yes Take 2 tablets (650 mg total) by mouth every 6 (six) hours as needed for  mild pain (or Fever >/= 101). Pearlean Manus, MD  Active   albuterol  (PROVENTIL ) (2.5 MG/3ML) 0.083% nebulizer solution [id number]  Yes Take 3 mLs (2.5 mg total) by nebulization every 6 (six) hours as needed for wheezing or shortness of breath. Jesus Bernardino MATSU, MD  Active   albuterol  (VENTOLIN  HFA) 108 [phone] Base) MCG/ACT inhaler [id number] Yes Inhale 2 puffs into the lungs every 6 (six) hours as needed for wheezing or shortness of breath. Jesus Bernardino MATSU, MD  Active   amLODipine -olmesartan  (AZOR ) 10-40 MG tablet [id number] Yes Take 1 tablet by mouth daily. Replaces amlodipine  and olmesartan  Jesus Bernardino MATSU, MD  Active   ASPIRIN  EC 81 MG EC tablet [id number] Yes Take 1 tablet (81 mg total) by mouth daily. Jesus Bernardino MATSU, MD  Active   Azelastine  HCl 137 MCG/SPRAY SOLN [id number] Yes Place 1 spray into the nose daily. Jesus Bernardino MATSU, MD  Active   bisoprolol  (ZEBETA ) 10 MG tablet [id number] Yes Take 2 tablets (20 mg total) by mouth daily. Jesus Bernardino MATSU, MD  Active   buPROPion  (WELLBUTRIN  XL) 150 MG 24 hr tablet [id number] Yes TAKE 1 TABLET BY MOUTH EVERY DAY Jesus Bernardino MATSU, MD  Active   celecoxib  (CELEBREX ) 200 MG capsule [id number] Yes TAKE 1 CAPSULE (200 MG TOTAL) BY MOUTH 2 (TWO) TIMES DAILY. REPLACES MELOXICAM  Jesus Bernardino MATSU, MD  Active   Cholecalciferol (VITAMIN D3) 125 MCG (5000 UT) CAPS [id number] Yes Take 1 capsule (5,000 Units total) by mouth daily. Jesus Bernardino MATSU, MD  Active   clotrimazole  (LOTRIMIN ) 1 % cream [id number]  Apply topically daily. Jesus Bernardino MATSU, MD  Active   clotrimazole -betamethasone  (LOTRISONE ) cream [id number] Yes APPLY TO AFFECTED AREA TWICE A DAY Jesus Bernardino MATSU, MD  Active   empagliflozin  (JARDIANCE ) 25 MG TABS tablet [id number] Yes Take 1 tablet (25 mg total) by mouth daily before breakfast. Jesus Bernardino MATSU, MD  Active   Evolocumab  (REPATHA  SURECLICK) 140 MG/ML EMMANUEL [id number] Yes Inject 140 mg into the skin every 14 (fourteen) days. Jesus Bernardino MATSU, MD  Active    famotidine -calcium  carbonate-magnesium hydroxide (PEPCID  COMPLETE) 10-800-165 MG chewable tablet [id number] Yes Chew 1 tablet by mouth 2 (two) times daily as needed. Jesus Bernardino MATSU, MD  Active   Finerenone  (KERENDIA ) 20 MG TABS [id number] Yes Take 1 tablet (20 mg total) by mouth daily. Replaces spironolactone  if approved, start at 10 mg for first 4 weeks (half tablets) Jesus Bernardino MATSU, MD  Active   fluticasone  (FLONASE ) 50 MCG/ACT nasal spray [id number] Yes Place 2 sprays into both nostrils daily. Jesus Bernardino MATSU, MD  Active   fluticasone  furoate-vilanterol (BREO ELLIPTA ) 100-25 MCG/ACT AEPB [id number] Yes Inhale 1 puff into the lungs daily. Jesus Bernardino MATSU, MD  Active   furosemide  (LASIX ) 40 MG tablet [id number] Yes Take 2 tablets (80 mg total) by mouth daily. Must take with spironolactone  to keep potassium up Reduce dose to just 40 mg daily if no fluid is accumulating. Jesus Bernardino MATSU, MD  Active   metFORMIN  (GLUCOPHAGE ) 1000 MG tablet [id number] Yes Take 1 tablet (1,000 mg total) by mouth 2 (two) times daily with a meal. Jesus Bernardino MATSU, MD  Active   olmesartan  (BENICAR ) 40 MG tablet [id number] Yes Take 1 tablet (40 mg total) by mouth daily. Jesus Bernardino MATSU, MD  Active   potassium chloride  (MICRO-K ) 10 MEQ CR capsule [id number] Yes TAKE 1 CAPSULE BY MOUTH EVERY DAY Jesus Bernardino MATSU, MD  Active   rosuvastatin  (CRESTOR ) 40 MG tablet [id number] Yes Take 1 tablet (40 mg total) by mouth daily. Replaces atorvastatin  which failed to reach Low Density Lipoprotein (LDL cholesterol) goal of  55, take only if repatha  not approved/available. Jesus Bernardino MATSU, MD  Active   spironolactone  (ALDACTONE ) 50 MG tablet [id number] Yes Take 1 tablet (50 mg total) by mouth daily. Replaces 25 mg dose Morrison, Ryan G, MD  Active   tirzepatide  (MOUNJARO ) 15 MG/0.5ML Pen [id number] Yes Inject 15 mg into the skin once a week. Replaces 12.5 mg dose Morrison, Ryan G, MD  Active             Recommendation:   Continue Current  Plan of Care  Follow Up Plan:   Patient has met all care management goals. Care Management case will be closed. Patient has been provided contact information should new needs arise.   Alm Armor, LCSW [facility], [facility] Licensed Clinical Social Worker Care Coordinator [phone]

## 2024-07-28 NOTE — Patient Instructions (Signed)
 Visit Information  Thank you for taking time to visit with me today. Please don't hesitate to contact me if I can be of assistance to you before our next scheduled appointment. The care guide team at [phone] if you need to cancel or reschedule your appointment.   Following is a copy of your care plan:   Goals Addressed             This Visit's Progress    COMPLETED: [facility] Social Work Care Plan       Problems:   Financial Strain   CSW Clinical Goal(s):   Over the next 4 weeks the Patient will explore community resource options for unmet needs related to Financial Strain .  Interventions:  Social Determinants of Health in Patient with CKD Stage 3: SDOH assessments completed: Financial Strain  Evaluation of current treatment plan related to unmet needs Sent education on local resources  Patient Goals/Self-Care Activities:  Review educational material on local resources .  Plan:   No further follow up required: please reach out if new needs are assessed.        Please call the Suicide and Crisis Lifeline: 988 if you are experiencing a Mental Health or Behavioral Health Crisis or need someone to talk to.  Patient verbalizes understanding of instructions and care plan provided today and agrees to view in MyChart. Active MyChart status and patient understanding of how to access instructions and care plan via MyChart confirmed with patient.     Alm Armor, LCSW /[facility], [facility] Licensed Clinical Social Worker Care Coordinator [phone]

## 2024-07-29 ENCOUNTER — Ambulatory Visit: Admitting: Physical Therapy

## 2024-07-29 ENCOUNTER — Encounter: Payer: Self-pay | Admitting: Physical Therapy

## 2024-07-29 DIAGNOSIS — G8929 Other chronic pain: Secondary | ICD-10-CM

## 2024-07-29 DIAGNOSIS — R2689 Other abnormalities of gait and mobility: Secondary | ICD-10-CM

## 2024-07-29 DIAGNOSIS — M25561 Pain in right knee: Secondary | ICD-10-CM | POA: Diagnosis not present

## 2024-07-29 DIAGNOSIS — M6281 Muscle weakness (generalized): Secondary | ICD-10-CM

## 2024-07-29 NOTE — Therapy (Signed)
 OUTPATIENT PHYSICAL THERAPY LOWER EXTREMITY TREATMENT   Patient Name: Jason Meyer MRN: [id number] DOB:[birth year], 62 y.o., male Today's Date: 07/29/2024  END OF SESSION:  PT End of Session - 07/29/24 0950     Visit Number 3    Number of Visits 12    Date for PT Re-Evaluation 08/29/24    Authorization Type aetna    PT Start Time 0930    PT Stop Time 1010    PT Time Calculation (min) 40 min    Activity Tolerance Patient tolerated treatment well    Behavior During Therapy [facility] for tasks assessed/performed           Past Medical History:  Diagnosis Date   Allergy    Arthritis    CAP (community acquired pneumonia) 04/12/2023   Chronic diastolic heart failure (HCC) 08/11/2022   Admitted with acute hypoxemic respiratory failure in the setting of acute diastolic CHF exacerbation      Coronary artery calcification 10/19/2022   Noted on CTA  which also noted Cardiac enlargement, bilateral pleural effusions and diffuse ground-glass attenuation suggestive of mild CHF. 07/2022 referral to cardiology   Hyperlipidemia    Hypertension    OSA (obstructive sleep apnea) 10/19/2022   Sepsis (HCC) 04/12/2023   Sleep apnea    Type 2 diabetes mellitus (HCC)    Past Surgical History:  Procedure Laterality Date   NO PAST SURGERIES     Patient Active Problem List   Diagnosis Date Noted   Material hardship due to limited financial resources 07/17/2024   Diabetic nephropathy with proteinuria (HCC) 05/15/2024   Vision problems 11/14/2023   Encounter for disability examination 11/14/2023   Vitamin D  deficiency 10/06/2023   Chronic kidney disease, stage 3 unspecified (HCC) 10/05/2023   On statin therapy 05/08/2023   Thalassemia carrier 12/27/2022   Coronary artery disease 12/26/2022   OSA (obstructive sleep apnea) 10/19/2022   Erectile dysfunction 10/19/2022   Low HDL (under 40) 10/19/2022   Elevated PSA 10/19/2022   Coronary artery calcification 10/19/2022   Chronic diastolic  heart failure (HCC) 90/98/7976   Venous stasis dermatitis of both lower extremities 08/11/2022   DOE (dyspnea on exertion) 06/22/2021   OA (osteoarthritis) 11/23/2015   Venous stasis 09/20/2015   Type 2 diabetes mellitus with other specified complication (HCC) 04/23/2013   Hypertension 04/23/2013   Morbid obesity with BMI of 50.0-59.9, adult (HCC) 04/23/2013   Mixed hyperlipidemia 04/23/2013    PCP: Bernardino Cone MD  REFERRING PROVIDER: Shirly Carlin LITTIE, PA-C   REFERRING DIAG: M25.561,M25.562,G89.29 (ICD-10-CM) - Bilateral chronic knee pain   THERAPY DIAG:  Bilateral chronic knee pain  Muscle weakness (generalized)  Other abnormalities of gait and mobility  Rationale for Evaluation and Treatment: Rehabilitation  ONSET DATE: chronic  SUBJECTIVE:   SUBJECTIVE STATEMENT: He had PT evaluation for Aquatic PT and therapist thought he would be better for candidate for land based therapy so he was transferred to our clinic today.  PERTINENT HISTORY: BMI 50; CHF Water therapy-bilateral knee OA  PAIN:  Are you having pain? Yes: NPRS scale: current 6/10 Pain location: Bilat knees L>R Pain description: achy Aggravating factors: standing > 2 hours; sitting > 2hours Relieving factors: moving positions, knee brace; tylonol 650 and celebrex   PRECAUTIONS: None  RED FLAGS: None   WEIGHT BEARING RESTRICTIONS: No  FALLS:  Has patient fallen in last 6 months? No  LIVING ENVIRONMENT: Lives with: lives with their family Lives in: House/apartment Stairs: No Has following equipment at home: None  OCCUPATION:  retired  PLOF: Independent  PATIENT GOALS: improve strength and flexability, decrease pain 1-2/10, return to gym  NEXT MD VISIT: tomorrow  OBJECTIVE:  Note: Objective measures were completed at Evaluation unless otherwise noted.  DIAGNOSTIC FINDINGS: Bialt knee oa  PATIENT SURVEYS:  LEFS  Extreme difficulty/unable (0), Quite a bit of difficulty (1), Moderate  difficulty (2), Little difficulty (3), No difficulty (4) Survey date:  07/16/24  Any of your usual work, housework or school activities 3  2. Usual hobbies, recreational or sporting activities 3  3. Getting into/out of the bath 2  4. Walking between rooms 3  5. Putting on socks/shoes 1  6. Squatting  4  7. Lifting an object, like a bag of groceries from the floor 3  8. Performing light activities around your home 2  9. Performing heavy activities around your home 2  10. Getting into/out of a car 3  11. Walking 2 blocks 1  12. Walking 1 mile 1  13. Going up/down 10 stairs (1 flight) 0  14. Standing for 1 hour 2  15.  sitting for 1 hour 3  16. Running on even ground 1  17. Running on uneven ground 0  18. Making sharp turns while running fast 0  19. Hopping  2  20. Rolling over in bed 3  Score total:  37/40     COGNITION: Overall cognitive status: Within functional limits for tasks assessed     SENSATION: WFL  EDEMA:  slight  MUSCLE LENGTH: Hamstrings: tight bilaterally tested in sittin   POSTURE: Bilateral varus deformity  PALPATION: TTP medial joint lines bilaterally  LOWER EXTREMITY strength:  Strength in Lbs HD  Right eval Left eval  Hip flexion 48.9 49.3  Hip extension    Hip abduction    Hip adduction    Hip internal rotation    Hip external rotation    Knee flexion    Knee extension 65.1 62.1  Ankle dorsiflexion    Ankle plantarflexion    Ankle inversion    Ankle eversion     (Blank rows = not tested)  LOWER EXTREMITY ROM:  AAROM Right eval Left eval  Hip flexion    Hip extension    Hip abduction    Hip adduction    Hip internal rotation    Hip external rotation    Knee flexion 120 aarom 108 aarom  Knee extension -5 -8  Ankle dorsiflexion    Ankle plantarflexion    Ankle inversion    Ankle eversion     (Blank rows = not tested)  LOWER EXTREMITY SPECIAL TESTS: 48 Negative Homans' sign bilaterally.   Stable to anterior and posterior  drawer sign bilaterally. Stable to varus and valgus stress.   FUNCTIONAL TESTS:  Timed up and go (TUG): 12.97  4 stage balance: passed 1&2;  Tandem x 3s; SLS unable  5 x STS: 19.46 from pool bench  GAIT: Distance walked: 500 ft Assistive device utilized: None Level of assistance: Complete Independence Comments: Initiation of gait antalgic bilaterally which improves slightly with distance.  Increase lateral displacement with decreased knee and hip flex.  TREATMENT  07/29/24 Therex: Nu step L1 seat #12, X 17 min Seated hamstring stretch 30 sec X 2 bilat Seated knee flexion AAROM 5 sec X 10   Theractivity: Leg press machine 30# DL [id number] Seated hamstring stretch 40# 2X12 Seated leg extension machine 25# 2X12 Standing mini squats X 10  07/23/24 Nu step L1 seat #12, X 15 min Seated hamstring stretch 30 sec X 2 bilat Seated knee flexion AAROM 5 sec X 10 Seated SLR 2X10 bilat Seated LAQ blue 2X10 bilat Standing mini squats X 10    PATIENT EDUCATION:  Education details: Discussed eval findings, rehab rationale, aquatic program progression/POC and pools in area. Patient is in agreement  Person educated: Patient Education method: Explanation Education comprehension: verbalized understanding  HOME EXERCISE PROGRAM: Access Code: [email or web address] URL: [email or web address] Date: 07/23/2024 Prepared by: Redell Moose  Exercises - Seated Hamstring Stretch  - 2 x daily - 6 x weekly - 1 sets - 3 reps - 30 sec hold - Seated Knee Flexion AAROM  - 2 x daily - 6 x weekly - 1 sets - 10 reps - 5 sec hold - Seated Straight Leg Raise with Quad Contraction  - 2 x daily - 6 x weekly - 2 sets - 10 reps - Seated Knee Extension with Resistance  - 2 x daily - 6 x weekly - 2 sets - 10 reps - Mini Squat with Counter Support  - 2 x daily - 6 x weekly - 1 sets - 10  reps  ASSESSMENT:  CLINICAL IMPRESSION: Session focused on showing him gym equipment that he can use to improve his overall leg strength. He shows good understanding of these and does plan to join the gym.    OBJECTIVE IMPAIRMENTS: Abnormal gait, decreased balance, decreased endurance, decreased knowledge of use of DME, decreased mobility, difficulty walking, decreased ROM, decreased strength, impaired flexibility, postural dysfunction, obesity, and pain.   ACTIVITY LIMITATIONS: squatting, stairs, transfers, and locomotion level  PARTICIPATION LIMITATIONS: shopping, community activity, occupation, and yard work  PERSONAL FACTORS: 1-2 comorbidities: morbid obesity and CHF are also affecting patient's functional outcome.   REHAB POTENTIAL: Good  CLINICAL DECISION MAKING: Stable/uncomplicated  EVALUATION COMPLEXITY: Low   GOALS: Goals reviewed with patient? Yes  SHORT TERM GOALS: Target date: 08/11/24 Pt will tolerate PT sessions without excessive pain or fatigue.  Baseline: Goal status: INITIAL  2.  Pt will maintain normal O2 sats throughout therapy. Baseline: CHF Goal status: INITIAL  3.  Pt to be compliant with initial HEP/begin consistent exercise regimen. Baseline:  Goal status: INITIAL   LONG TERM GOALS: Target date: 08/29/24  Pt to improve on LEFS by at least 9 point to demonstrate statistically significant Improvement in function. Baseline: 37/40 Goal status: INITIAL  2.  Pt to increase knee extension bilaterally to 0d and flex to 120d Baseline: see chart Goal status: INITIAL  3.  Pt will improve on 5 X STS test to <or=  15s  to demonstrate improving functional lower extremity strength, transitional movements, and balance. (MDC = 4.2sec)  Baseline: 19.46 Goal status: INITIAL  4.  Pt will perform SLS x 20s to demonstrate a decrease in fall risk. Baseline: see chart Goal status: INITIAL  5.  Pt will be indep with final HEP for continued management of  condition Baseline:  Goal status: INITIAL  6. Pt will report decrease in pain by at least 50% for improved toleration to activity/quality of life and to demonstrate improved management of pain.  Baseline: see chart  Goal Status: INITIAL   PLAN:  PT FREQUENCY: 2x/week  PT DURATION: 6 weeks  PLANNED INTERVENTIONS: 97164- PT Re-evaluation, 97750- Physical Performance Testing, 97110-Therapeutic exercises, 97530- Therapeutic activity, [id number]- Neuromuscular re-education, 97535- Self Care, 02859- Manual therapy, [phone]- Gait training, [phone]- Aquatic Therapy, [phone]- Electrical stimulation (manual), [phone]- Ionotophoresis 4mg /ml Dexamethasone , 79439 (1-2 muscles), 20561 (3+ muscles)- Dry Needling, Patient/Family education, Balance training, Stair training, Taping, Joint mobilization, DME instructions, Cryotherapy, and Moist heat  PLAN FOR NEXT SESSION: He plans to join gym after PT session. bilat knee ROM and strengthening; proprioception and balance retraining, gait   Redell Moose, PT, DPT 07/29/24 10:46 AM  [facility] [address] [place], [place] [address] Phone: [phone]

## 2024-08-15 ENCOUNTER — Other Ambulatory Visit: Payer: Self-pay | Admitting: Internal Medicine

## 2024-08-15 DIAGNOSIS — I872 Venous insufficiency (chronic) (peripheral): Secondary | ICD-10-CM

## 2024-08-18 ENCOUNTER — Ambulatory Visit: Attending: Internal Medicine | Admitting: Physical Therapy

## 2024-08-18 ENCOUNTER — Encounter: Payer: Self-pay | Admitting: Physical Therapy

## 2024-08-18 DIAGNOSIS — M25561 Pain in right knee: Secondary | ICD-10-CM | POA: Insufficient documentation

## 2024-08-18 DIAGNOSIS — R2689 Other abnormalities of gait and mobility: Secondary | ICD-10-CM | POA: Insufficient documentation

## 2024-08-18 DIAGNOSIS — M25562 Pain in left knee: Secondary | ICD-10-CM | POA: Diagnosis present

## 2024-08-18 DIAGNOSIS — G8929 Other chronic pain: Secondary | ICD-10-CM | POA: Diagnosis present

## 2024-08-18 DIAGNOSIS — M6281 Muscle weakness (generalized): Secondary | ICD-10-CM | POA: Insufficient documentation

## 2024-08-18 NOTE — Therapy (Signed)
 OUTPATIENT PHYSICAL THERAPY LOWER EXTREMITY TREATMENT   Patient Name: Jason Meyer MRN: [id number] DOB:08/19/1961, 63 y.o., male Today's Date: 08/18/2024  END OF SESSION:  PT End of Session - 08/18/24 1437     Visit Number 4    Number of Visits 12    Date for PT Re-Evaluation 08/29/24    Authorization Type aetna    PT Start Time 1436    PT Stop Time 1515    PT Time Calculation (min) 39 min    Activity Tolerance Patient tolerated treatment well    Behavior During Therapy [facility] for tasks assessed/performed            Past Medical History:  Diagnosis Date   Allergy    Arthritis    CAP (community acquired pneumonia) 04/12/2023   Chronic diastolic heart failure (HCC) 08/11/2022   Admitted with acute hypoxemic respiratory failure in the setting of acute diastolic CHF exacerbation      Coronary artery calcification 10/19/2022   Noted on CTA  which also noted Cardiac enlargement, bilateral pleural effusions and diffuse ground-glass attenuation suggestive of mild CHF. 07/2022 referral to cardiology   Hyperlipidemia    Hypertension    OSA (obstructive sleep apnea) 10/19/2022   Sepsis (HCC) 04/12/2023   Sleep apnea    Type 2 diabetes mellitus (HCC)    Past Surgical History:  Procedure Laterality Date   NO PAST SURGERIES     Patient Active Problem List   Diagnosis Date Noted   Material hardship due to limited financial resources 07/17/2024   Diabetic nephropathy with proteinuria (HCC) 05/15/2024   Vision problems 11/14/2023   Encounter for disability examination 11/14/2023   Vitamin D  deficiency 10/06/2023   Chronic kidney disease, stage 3 unspecified (HCC) 10/05/2023   On statin therapy 05/08/2023   Thalassemia carrier 12/27/2022   Coronary artery disease 12/26/2022   OSA (obstructive sleep apnea) 10/19/2022   Erectile dysfunction 10/19/2022   Low HDL (under 40) 10/19/2022   Elevated PSA 10/19/2022   Coronary artery calcification 10/19/2022   Chronic diastolic  heart failure (HCC) 90/98/7976   Venous stasis dermatitis of both lower extremities 08/11/2022   DOE (dyspnea on exertion) 06/22/2021   OA (osteoarthritis) 11/23/2015   Venous stasis 09/20/2015   Type 2 diabetes mellitus with other specified complication (HCC) 04/23/2013   Hypertension 04/23/2013   Morbid obesity with BMI of 50.0-59.9, adult (HCC) 04/23/2013   Mixed hyperlipidemia 04/23/2013    PCP: Bernardino Cone MD  REFERRING PROVIDER: Shirly Carlin LITTIE, PA-C   REFERRING DIAG: M25.561,M25.562,G89.29 (ICD-10-CM) - Bilateral chronic knee pain   THERAPY DIAG:  Bilateral chronic knee pain  Muscle weakness (generalized)  Other abnormalities of gait and mobility  Rationale for Evaluation and Treatment: Rehabilitation  ONSET DATE: chronic  SUBJECTIVE:   SUBJECTIVE STATEMENT: Pt states he should be getting cortisone shots in the next month or so.   PERTINENT HISTORY: BMI 50; CHF Water therapy-bilateral knee OA  PAIN:  Are you having pain? Yes: NPRS scale: current 5/10 Pain location: Bilat knees L>R Pain description: achy Aggravating factors: standing > 2 hours; sitting > 2hours Relieving factors: moving positions, knee brace; tylonol 650 and celebrex   PRECAUTIONS: None  RED FLAGS: None   WEIGHT BEARING RESTRICTIONS: No  FALLS:  Has patient fallen in last 6 months? No  LIVING ENVIRONMENT: Lives with: lives with their family Lives in: House/apartment Stairs: No Has following equipment at home: None  OCCUPATION: retired  PLOF: Independent  PATIENT GOALS: improve strength and flexability, decrease  pain 1-2/10, return to gym  NEXT MD VISIT: tomorrow  OBJECTIVE:  Note: Objective measures were completed at Evaluation unless otherwise noted.  DIAGNOSTIC FINDINGS: Bialt knee oa  PATIENT SURVEYS:  LEFS  Extreme difficulty/unable (0), Quite a bit of difficulty (1), Moderate difficulty (2), Little difficulty (3), No difficulty (4) Survey date:  07/16/24  Any of  your usual work, housework or school activities 3  2. Usual hobbies, recreational or sporting activities 3  3. Getting into/out of the bath 2  4. Walking between rooms 3  5. Putting on socks/shoes 1  6. Squatting  4  7. Lifting an object, like a bag of groceries from the floor 3  8. Performing light activities around your home 2  9. Performing heavy activities around your home 2  10. Getting into/out of a car 3  11. Walking 2 blocks 1  12. Walking 1 mile 1  13. Going up/down 10 stairs (1 flight) 0  14. Standing for 1 hour 2  15.  sitting for 1 hour 3  16. Running on even ground 1  17. Running on uneven ground 0  18. Making sharp turns while running fast 0  19. Hopping  2  20. Rolling over in bed 3  Score total:  37/40     COGNITION: Overall cognitive status: Within functional limits for tasks assessed     SENSATION: WFL  EDEMA:  slight  MUSCLE LENGTH: Hamstrings: tight bilaterally tested in sittin   POSTURE: Bilateral varus deformity  PALPATION: TTP medial joint lines bilaterally  LOWER EXTREMITY strength:  Strength in Lbs HD  Right eval Left eval  Hip flexion 48.9 49.3  Hip extension    Hip abduction    Hip adduction    Hip internal rotation    Hip external rotation    Knee flexion    Knee extension 65.1 62.1  Ankle dorsiflexion    Ankle plantarflexion    Ankle inversion    Ankle eversion     (Blank rows = not tested)  LOWER EXTREMITY ROM:  AAROM Right eval Left eval  Hip flexion    Hip extension    Hip abduction    Hip adduction    Hip internal rotation    Hip external rotation    Knee flexion 120 aarom 108 aarom  Knee extension -5 -8  Ankle dorsiflexion    Ankle plantarflexion    Ankle inversion    Ankle eversion     (Blank rows = not tested)  LOWER EXTREMITY SPECIAL TESTS: 48 Negative Homans' sign bilaterally.   Stable to anterior and posterior drawer sign bilaterally. Stable to varus and valgus stress.   FUNCTIONAL TESTS:  Timed  up and go (TUG): 12.97  4 stage balance: passed 1&2;  Tandem x 3s; SLS unable  5 x STS: 19.46 from pool bench  GAIT: Distance walked: 500 ft Assistive device utilized: None Level of assistance: Complete Independence Comments: Initiation of gait antalgic bilaterally which improves slightly with distance.  Increase lateral displacement with decreased knee and hip flex.  TREATMENT  07/29/24 Therex: Nu step L5 seat #13 x 8 min Seated hamstring stretch 30 sec X 2 bilat Supine hip flexor/quad stretch 2 x 30 sec Supine SLR 4# 2x10 Sidelying hip abd 4# 2x10 Sidelying hip ext 4# 2x10 Seated LAQ eccentrics 4# 2x10  Theractivity: Sit<>stand 2x10 with 20# KB Deadlift 20# KB 2x10    07/23/24 Nu step L1 seat #12, X 15 min Seated hamstring stretch 30 sec X 2 bilat Seated knee flexion AAROM 5 sec X 10 Seated SLR 2X10 bilat Seated LAQ blue 2X10 bilat Standing mini squats X 10    PATIENT EDUCATION:  Education details: Discussed eval findings, rehab rationale, aquatic program progression/POC and pools in area. Patient is in agreement  Person educated: Patient Education method: Explanation Education comprehension: verbalized understanding  HOME EXERCISE PROGRAM: Access Code: [email or web address] URL: [email or web address] Date: 07/23/2024 Prepared by: Redell Moose  Exercises - Seated Hamstring Stretch  - 2 x daily - 6 x weekly - 1 sets - 3 reps - 30 sec hold - Seated Knee Flexion AAROM  - 2 x daily - 6 x weekly - 1 sets - 10 reps - 5 sec hold - Seated Straight Leg Raise with Quad Contraction  - 2 x daily - 6 x weekly - 2 sets - 10 reps - Seated Knee Extension with Resistance  - 2 x daily - 6 x weekly - 2 sets - 10 reps - Mini Squat with Counter Support  - 2 x daily - 6 x weekly - 1 sets - 10 reps  ASSESSMENT:  CLINICAL IMPRESSION: Worked on updating  pt's HEP for knee ROM and strengthening. Educated pt on anti inflammatory diet and printed out education.   OBJECTIVE IMPAIRMENTS: Abnormal gait, decreased balance, decreased endurance, decreased knowledge of use of DME, decreased mobility, difficulty walking, decreased ROM, decreased strength, impaired flexibility, postural dysfunction, obesity, and pain.   ACTIVITY LIMITATIONS: squatting, stairs, transfers, and locomotion level  PARTICIPATION LIMITATIONS: shopping, community activity, occupation, and yard work  PERSONAL FACTORS: 1-2 comorbidities: morbid obesity and CHF are also affecting patient's functional outcome.   REHAB POTENTIAL: Good  CLINICAL DECISION MAKING: Stable/uncomplicated  EVALUATION COMPLEXITY: Low   GOALS: Goals reviewed with patient? Yes  SHORT TERM GOALS: Target date: 08/11/24 Pt will tolerate PT sessions without excessive pain or fatigue.  Baseline: Goal status: INITIAL  2.  Pt will maintain normal O2 sats throughout therapy. Baseline: CHF Goal status: MET  3.  Pt to be compliant with initial HEP/begin consistent exercise regimen. Baseline:  Goal status: IN PROGRESS   LONG TERM GOALS: Target date: 08/29/24  Pt to improve on LEFS by at least 9 point to demonstrate statistically significant Improvement in function. Baseline: 37/40 Goal status: INITIAL  2.  Pt to increase knee extension bilaterally to 0d and flex to 120d Baseline: see chart Goal status: INITIAL  3.  Pt will improve on 5 X STS test to <or=  15s  to demonstrate improving functional lower extremity strength, transitional movements, and balance. (MDC = 4.2sec)  Baseline: 19.46 Goal status: INITIAL  4.  Pt will perform SLS x 20s to demonstrate a decrease in fall risk. Baseline: see chart Goal status: INITIAL  5.  Pt will be indep with final HEP for continued management of condition Baseline:  Goal status: INITIAL  6. Pt will report decrease in pain by at least 50% for improved  toleration to activity/quality of life and to demonstrate improved management of pain.  Baseline: see chart  Goal Status: INITIAL  PLAN:  PT FREQUENCY: 2x/week  PT DURATION: 6 weeks  PLANNED INTERVENTIONS: 97164- PT Re-evaluation, 97750- Physical Performance Testing, 97110-Therapeutic exercises, 97530- Therapeutic activity, [id number]- Neuromuscular re-education, 97535- Self Care, 02859- Manual therapy, [phone]- Gait training, [phone]- Aquatic Therapy, [phone]- Electrical stimulation (manual), [id number]- Ionotophoresis 4mg /ml Dexamethasone , 79439 (1-2 muscles), 20561 (3+ muscles)- Dry Needling, Patient/Family education, Balance training, Stair training, Taping, Joint mobilization, DME instructions, Cryotherapy, and Moist heat  PLAN FOR NEXT SESSION: He plans to join gym after PT session. bilat knee ROM and strengthening; proprioception and balance retraining, gait  Vernadette Stutsman April Honor LITTIE Starring, [place], DPT 08/18/24 2:38 PM  [facility] [address] [place], [place] [address] Phone: [phone]

## 2024-08-19 ENCOUNTER — Ambulatory Visit: Admitting: Internal Medicine

## 2024-08-19 ENCOUNTER — Encounter: Payer: Self-pay | Admitting: Internal Medicine

## 2024-08-19 VITALS — BP 136/78 | HR 85 | Temp 97.6°F | Ht 71.0 in | Wt 358.8 lb

## 2024-08-19 DIAGNOSIS — I5032 Chronic diastolic (congestive) heart failure: Secondary | ICD-10-CM

## 2024-08-19 DIAGNOSIS — E1169 Type 2 diabetes mellitus with other specified complication: Secondary | ICD-10-CM | POA: Diagnosis not present

## 2024-08-19 DIAGNOSIS — E785 Hyperlipidemia, unspecified: Secondary | ICD-10-CM

## 2024-08-19 DIAGNOSIS — Z6841 Body Mass Index (BMI) 40.0 and over, adult: Secondary | ICD-10-CM

## 2024-08-19 DIAGNOSIS — I1A0 Resistant hypertension: Secondary | ICD-10-CM

## 2024-08-19 MED ORDER — BISOPROLOL FUMARATE 10 MG PO TABS: 10.0000 mg | ORAL_TABLET | Freq: Every day | ORAL | 4 refills | Status: AC

## 2024-08-19 MED ORDER — HYDROCHLOROTHIAZIDE 25 MG PO TABS: 25.0000 mg | ORAL_TABLET | Freq: Every day | ORAL | 3 refills | Status: DC

## 2024-08-19 MED ORDER — EZETIMIBE 10 MG PO TABS: 10.0000 mg | ORAL_TABLET | Freq: Every day | ORAL | 3 refills | Status: AC

## 2024-08-19 NOTE — Progress Notes (Signed)
 ==============================  [facility]: [phone]   -- Medical Office Visit --  Patient: Jason Meyer      Age: 63 y.o.       Sex:  male  Date:   08/19/2024 Today's Healthcare Provider: Bernardino KANDICE Cone, MD  ==============================   Chief Complaint: Medication Refill (States that his injection he is taking is going well for him no side effects at this time. Just here for follow up since last visit) and Discuss labs (Pt would like to talk about labs today as well.)   Discussed the use of AI scribe software for clinical note transcription with the patient, who gave verbal consent to proceed.  History of Present Illness 63 year old male with obesity and diabetes who presents for weight management and medication review.  He has experienced weight fluctuations since 2019, reaching a peak of 423 pounds. Currently, he weighs 358 pounds, down from 384 pounds in September 2024. He is on a weight loss medication at a dose of 15 mg with no side effects. He has recently joined a gym and plans to work out three days a week to aid in weight loss.  He has a history of diabetes with a last A1c of 6.1. He has not been checking his blood sugars at home but states that his diabetes has been well-controlled since 2020. He is on medication to protect his kidneys and manage his blood sugar.  His cholesterol levels have been stable, with a total cholesterol of 136, HDL between 37 to 42, and LDL between 61 to 76. He is currently taking Repatha  and rosuvastatin  without any issues.  He uses a CPAP machine for sleep apnea, which has significantly improved his sleep quality, allowing him to sleep through the night.  His blood pressure was recorded at 136/78 today. He is on bisoprolol , olmesartan , and spironolactone . He reports some leg swelling and is taking furosemide  for fluid management.  He reports that his kidneys are functioning adequately.  Wt  Readings from Last 50 Encounters:  08/19/24 (!) 358 lb 12.8 oz (162.8 kg)  07/17/24 (!) 361 lb 12.8 oz (164.1 kg)  05/28/24 (!) 358 lb 3.2 oz (162.5 kg)  05/15/24 (!) 361 lb 6.4 oz (163.9 kg)  05/09/24 (!) 363 lb (164.7 kg)  04/14/24 (!) 371 lb 12.8 oz (168.6 kg)  01/14/24 (!) 369 lb 6.4 oz (167.6 kg)  11/14/23 (!) 370 lb (167.8 kg)  10/05/23 (!) 371 lb 9.6 oz (168.6 kg)  09/07/23 (!) 384 lb 3.2 oz (174.3 kg)  05/08/23 (!) 375 lb 6.4 oz (170.3 kg)  04/25/23 (!) 364 lb (165.1 kg)  04/13/23 (!) 379 lb 3.1 oz (172 kg)  02/06/23 (!) 380 lb 9.6 oz (172.6 kg)  12/26/22 (!) 376 lb 3.2 oz (170.6 kg)  10/19/22 (!) 390 lb (176.9 kg)  09/14/22 (!) 389 lb 6.4 oz (176.6 kg)  08/23/22 (!) 388 lb (176 kg)  08/11/22 (!) 386 lb 6.4 oz (175.3 kg)  08/01/22 (!) 384 lb 6.4 oz (174.4 kg)  07/14/22 (!) 399 lb (181 kg)  04/27/22 (!) 388 lb (176 kg)  03/29/22 (!) 386 lb (175.1 kg)  02/22/22 (!) 386 lb (175.1 kg)  02/10/22 (!) 378 lb 15.5 oz (171.9 kg)  12/22/21 (!) 393 lb (178.3 kg)  11/24/21 (!) 391 lb (177.4 kg)  11/18/21 (!) 392 lb (177.8 kg)  11/17/21 (!) 400 lb (181.4 kg)  10/13/21 (!) 395 lb 1.3 oz (179.2 kg)  10/06/21 ROLLEN)  390 lb 6.4 oz (177.1 kg)  09/26/21 (!) 395 lb 12.8 oz (179.5 kg)  09/16/21 (!) 393 lb (178.3 kg)  08/17/21 (!) 386 lb 9.6 oz (175.4 kg)  06/22/21 (!) 391 lb (177.4 kg)  05/26/21 (!) 384 lb (174.2 kg)  05/03/21 (!) 382 lb (173.3 kg)  01/19/21 (!) 388 lb (176 kg)  09/15/20 (!) 386 lb (175.1 kg)  06/09/20 (!) 400 lb (181.4 kg)  08/26/19 (!) 408 lb (185.1 kg)  05/21/19 (!) 415 lb 12.8 oz (188.6 kg)  03/11/19 (!) 407 lb (184.6 kg)  08/07/18 (!) 423 lb 3.2 oz (192 kg)  06/27/18 (!) 417 lb 9.6 oz (189.4 kg)  04/08/18 (!) 403 lb 3.2 oz (182.9 kg)  01/03/18 (!) 404 lb 12.8 oz (183.6 kg)  12/07/17 (!) 412 lb (186.9 kg)  10/03/17 (!) 395 lb 6.4 oz (179.4 kg)  08/28/17 (!) 416 lb 6.4 oz (188.9 kg)   BMI Readings from Last 50 Encounters:  08/19/24 50.04 kg/m  07/17/24 50.46  kg/m  05/28/24 49.96 kg/m  05/15/24 50.41 kg/m  05/09/24 50.63 kg/m  04/14/24 51.86 kg/m  01/14/24 51.52 kg/m  11/14/23 51.60 kg/m  10/05/23 51.83 kg/m  09/07/23 53.59 kg/m  05/08/23 52.36 kg/m  04/25/23 50.77 kg/m  04/13/23 52.89 kg/m  02/06/23 53.08 kg/m  12/26/22 52.47 kg/m  10/19/22 54.39 kg/m  09/14/22 54.31 kg/m  08/23/22 54.12 kg/m  08/11/22 53.89 kg/m  08/01/22 53.61 kg/m  07/14/22 55.65 kg/m  04/27/22 54.12 kg/m  03/29/22 53.84 kg/m  02/22/22 53.84 kg/m  02/10/22 52.86 kg/m  12/22/21 58.04 kg/m  11/24/21 57.74 kg/m  11/18/21 57.89 kg/m  11/17/21 55.79 kg/m  10/13/21 55.10 kg/m  10/06/21 54.45 kg/m  09/26/21 55.20 kg/m  09/16/21 54.81 kg/m  08/17/21 53.92 kg/m  06/22/21 54.53 kg/m  05/26/21 53.56 kg/m  05/03/21 53.28 kg/m  01/19/21 54.12 kg/m  12/20/20 53.84 kg/m  09/15/20 53.84 kg/m  06/09/20 55.79 kg/m  08/26/19 56.90 kg/m  05/21/19 57.99 kg/m  03/11/19 56.76 kg/m  10/10/18 59.02 kg/m  08/07/18 59.02 kg/m  06/27/18 58.24 kg/m  04/08/18 56.23 kg/m  01/03/18 56.46 kg/m  12/07/17 57.46 kg/m   Lab Results  Component Value Date   CHOL 136 07/17/2024   CHOL 125 01/14/2024   CHOL 113 09/07/2023   HDL 42.80 07/17/2024   HDL 40.10 01/14/2024   HDL 37 (L) 09/07/2023   LDLCALC 76 07/17/2024   LDLCALC 66 01/14/2024   LDLCALC 61 09/07/2023   TRIG 86.0 07/17/2024   TRIG 93.0 01/14/2024   TRIG 73 09/07/2023   CHOLHDL 3 07/17/2024   CHOLHDL 3 01/14/2024   CHOLHDL 3.1 09/07/2023   Lab Results  Component Value Date   HGBA1C 6.7 (H) 07/17/2024   HGBA1C 6.5 04/14/2024   HGBA1C 6.3 01/14/2024   HGBA1C 6.7 (H) 09/07/2023   HGBA1C 6.0 (H) 04/12/2023   HGBA1C 5.9 (H) 07/14/2022   HGBA1C 6.3 (H) 03/29/2022   HGBA1C 6.2 (H) 02/10/2022   HGBA1C 6.5 (H) 12/22/2021   HGBA1C 6.2 (H) 09/16/2021   HGBA1C 6.2 05/26/2021   HGBA1C 6.5 01/19/2021   HGBA1C 8.0 (H) 09/15/2020   HGBA1C 7.2 (H) 06/09/2020   HGBA1C  6.8 08/27/2019   HGBA1C 7.0 (H) 05/21/2019   HGBA1C 10.9 (H) 08/07/2018   HGBA1C 10.7 (H) 04/08/2018   HGBA1C >14.0 (H) 01/03/2018   HGBA1C 10.5 (H) 06/22/2017   HGBA1C 9.4 (H) 05/12/2016   HGBA1C 7.8 12/24/2015   HGBA1C 7.9 09/13/2015   HGBA1C 9.2 05/03/2015   HGBA1C 9.3 01/06/2015  HGBA1C 6.7 % 10/21/2013    BP Readings from Last 30 Encounters:  08/19/24 136/78  07/17/24 (!) 140/78  05/15/24 (!) 140/80  05/09/24 (!) 160/90  04/14/24 (!) 150/88  01/14/24 136/86  11/14/23 134/73  10/05/23 (!) 146/82  09/07/23 116/73  05/08/23 122/73  04/25/23 133/67  04/17/23 (!) 115/48  02/06/23 130/78  12/26/22 114/68  10/19/22 (!) 152/75  09/14/22 (!) 144/77  08/11/22 134/70  08/01/22 132/72  07/14/22 131/78  04/27/22 (!) 149/81  03/29/22 137/78  02/22/22 (!) 146/81  02/11/22 105/66  12/22/21 (!) 163/85  11/24/21 (!) 150/83  11/18/21 (!) 157/80  11/17/21 (!) 164/78  10/13/21 (!) 150/98  10/06/21 (!) 184/98  09/26/21 (!) 188/80   Lab Results  Component Value Date   GFR 54.66 (L) 07/17/2024   GFR 55.24 (L) 04/14/2024   GFR 57.84 (L) 01/14/2024   GFR 71.43 05/08/2023   GFR 67.96 12/26/2022   Lab Results  Component Value Date   EGFR 68 03/29/2022   EGFR 75 02/22/2022   EGFR 73 12/22/2021      has Type 2 diabetes mellitus with other specified complication (HCC); Hypertension; Morbid obesity with BMI of 50.0-59.9, adult (HCC); Mixed hyperlipidemia; Venous stasis; OA (osteoarthritis); DOE (dyspnea on exertion); Chronic diastolic heart failure (HCC); Venous stasis dermatitis of both lower extremities; OSA (obstructive sleep apnea); Erectile dysfunction; Low HDL (under 40); Elevated PSA; Coronary artery calcification; Coronary artery disease; On statin therapy; Chronic kidney disease, stage 3 unspecified (HCC); Vitamin D  deficiency; Vision problems; Encounter for disability examination; Thalassemia carrier; Diabetic nephropathy with proteinuria (HCC); and Material hardship  due to limited financial resources on their problem list. Goal blood pressure 130/80 Medication Sig Start Date End Date  ezetimibe  (ZETIA ) 10 MG tablet Take 1 tablet (10 mg total) by mouth daily. 08/19/24     04/17/23     06/11/24     11/14/23     05/15/24     11/14/23     11/14/23     11/14/23     03/19/24     05/27/24     07/17/24     11/14/23     08/15/24     07/17/24     11/14/23     11/14/23     07/17/24     01/14/24     11/14/23     11/14/23     11/14/23     05/02/24     11/14/23     07/17/24     05/15/24        continues on, as of 08/19/2024      Current Outpatient Medications (Endocrine & Metabolic):    empagliflozin  (JARDIANCE ) 25 MG TABS tablet, Take 1 tablet (25 mg total) by mouth daily before breakfast.   Finerenone  (KERENDIA ) 20 MG TABS, Take 1 tablet (20 mg total) by mouth daily. Replaces spironolactone  if approved, start at 10 mg for first 4 weeks (half tablets)   metFORMIN  (GLUCOPHAGE ) 1000 MG tablet, Take 1 tablet (1,000 mg total) by mouth 2 (two) times daily with a meal.   tirzepatide  (MOUNJARO ) 15 MG/0.5ML Pen, Inject 15 mg into the skin once a week. Replaces 12.5 mg dose  Current Outpatient Medications (Cardiovascular):    bisoprolol  (ZEBETA ) 10 MG tablet, Take 1 tablet (10 mg total) by mouth daily.   ezetimibe  (ZETIA ) 10 MG tablet, Take 1 tablet (10 mg total) by mouth daily.   hydrochlorothiazide  (HYDRODIURIL ) 25 MG tablet, Take 1 tablet (25 mg total) by mouth daily. Only Half tablet  daily am until kidneys checked   amLODipine -olmesartan  (AZOR ) 10-40 MG tablet, Take 1 tablet by mouth daily. Replaces amlodipine  and olmesartan    Evolocumab  (REPATHA  SURECLICK) 140 MG/ML SOAJ, Inject 140 mg into the skin every 14 (fourteen) days.   furosemide  (LASIX ) 40 MG tablet, Take 2 tablets (80 mg total) by mouth daily. Must take with spironolactone  to keep potassium up Reduce dose to just 40 mg daily if no fluid is accumulating.   rosuvastatin  (CRESTOR ) 40 MG tablet, Take 1 tablet (40 mg total) by  mouth daily. Replaces atorvastatin  which failed to reach Low Density Lipoprotein (LDL cholesterol) goal of 55, take only if repatha  not approved/available.   spironolactone  (ALDACTONE ) 50 MG tablet, Take 1 tablet (50 mg total) by mouth daily. Replaces 25 mg dose  Current Outpatient Medications (Respiratory):    albuterol  (PROVENTIL ) (2.5 MG/3ML) 0.083% nebulizer solution, Take 3 mLs (2.5 mg total) by nebulization every 6 (six) hours as needed for wheezing or shortness of breath.   albuterol  (VENTOLIN  HFA) 108 (90 Base) MCG/ACT inhaler, Inhale 2 puffs into the lungs every 6 (six) hours as needed for wheezing or shortness of breath.   Azelastine  HCl 137 MCG/SPRAY SOLN, Place 1 spray into the nose daily.   fluticasone  (FLONASE ) 50 MCG/ACT nasal spray, Place 2 sprays into both nostrils daily.   fluticasone  furoate-vilanterol (BREO ELLIPTA ) 100-25 MCG/ACT AEPB, Inhale 1 puff into the lungs daily.  Current Outpatient Medications (Analgesics):    acetaminophen  (TYLENOL ) 325 MG tablet, Take 2 tablets (650 mg total) by mouth every 6 (six) hours as needed for mild pain (or Fever >/= 101).   ASPIRIN  EC 81 MG EC tablet, Take 1 tablet (81 mg total) by mouth daily.   celecoxib  (CELEBREX ) 200 MG capsule, TAKE 1 CAPSULE (200 MG TOTAL) BY MOUTH 2 (TWO) TIMES DAILY. REPLACES MELOXICAM    Current Outpatient Medications (Other):    buPROPion  (WELLBUTRIN  XL) 150 MG 24 hr tablet, TAKE 1 TABLET BY MOUTH EVERY DAY   Cholecalciferol (VITAMIN D3) 125 MCG (5000 UT) CAPS, Take 1 capsule (5,000 Units total) by mouth daily.   clotrimazole  (LOTRIMIN ) 1 % cream, Apply topically daily.   clotrimazole -betamethasone  (LOTRISONE ) cream, APPLY TO AFFECTED AREA TWICE A DAY   famotidine -calcium  carbonate-magnesium hydroxide (PEPCID  COMPLETE) 10-800-165 MG chewable tablet, Chew 1 tablet by mouth 2 (two) times daily as needed.   potassium chloride  (MICRO-K ) 10 MEQ CR capsule, TAKE 1 CAPSULE BY MOUTH EVERY  DAY  acetaminophen  albuterol  amLODipine -olmesartan  aspirin  EC Azelastine  HCl Soln bisoprolol  buPROPion  celecoxib  clotrimazole  clotrimazole -betamethasone  empagliflozin  Tabs ezetimibe  fluticasone  fluticasone  furoate-vilanterol Aepb furosemide  hydrochlorothiazide  Kerendia  Tabs metFORMIN  Pepcid  Complete Chew potassium chloride  Repatha  SureClick Soaj rosuvastatin  spironolactone  tirzepatide  Vitamin D3 Caps  Background Reviewed: Problem List: has Type 2 diabetes mellitus with other specified complication (HCC); Hypertension; Morbid obesity with BMI of 50.0-59.9, adult (HCC); Mixed hyperlipidemia; Venous stasis; OA (osteoarthritis); DOE (dyspnea on exertion); Chronic diastolic heart failure (HCC); Venous stasis dermatitis of both lower extremities; OSA (obstructive sleep apnea); Erectile dysfunction; Low HDL (under 40); Elevated PSA; Coronary artery calcification; Coronary artery disease; On statin therapy; Chronic kidney disease, stage 3 unspecified (HCC); Vitamin D  deficiency; Vision problems; Encounter for disability examination; Thalassemia carrier; Diabetic nephropathy with proteinuria (HCC); and Material hardship due to limited financial resources on their problem list. Past Medical History:  has a past medical history of Allergy, Arthritis, CAP (community acquired pneumonia) (04/12/2023), Chronic diastolic heart failure (HCC) (90/98/7976), Coronary artery calcification (10/19/2022), Hyperlipidemia, Hypertension, OSA (obstructive sleep apnea) (10/19/2022), Sepsis (HCC) (04/12/2023), Sleep apnea, and Type  2 diabetes mellitus (HCC). Past Surgical History:   has a past surgical history that includes No past surgeries. Social History:   reports that he quit smoking about 16 years ago. His smoking use included cigarettes. He started smoking about 49 years ago. He has a 33 pack-year smoking history. He has never used smokeless tobacco. He reports that he does not currently use alcohol. He  reports that he does not use drugs. Family History:  family history includes Alzheimer's disease in his mother; Heart attack in his brother and father; Heart disease in his father; Hypertension in his father; Stroke in his father. Allergies:  is allergic to penicillins.   Medication Reconciliation: Current Outpatient Medications on File Prior to Visit  Medication Sig   acetaminophen  (TYLENOL ) 325 MG tablet Take 2 tablets (650 mg total) by mouth every 6 (six) hours as needed for mild pain (or Fever >/= 101).   albuterol  (PROVENTIL ) (2.5 MG/3ML) 0.083% nebulizer solution Take 3 mLs (2.5 mg total) by nebulization every 6 (six) hours as needed for wheezing or shortness of breath.   albuterol  (VENTOLIN  HFA) 108 (90 Base) MCG/ACT inhaler Inhale 2 puffs into the lungs every 6 (six) hours as needed for wheezing or shortness of breath.   amLODipine -olmesartan  (AZOR ) 10-40 MG tablet Take 1 tablet by mouth daily. Replaces amlodipine  and olmesartan    ASPIRIN  EC 81 MG EC tablet Take 1 tablet (81 mg total) by mouth daily.   Azelastine  HCl 137 MCG/SPRAY SOLN Place 1 spray into the nose daily.   buPROPion  (WELLBUTRIN  XL) 150 MG 24 hr tablet TAKE 1 TABLET BY MOUTH EVERY DAY   celecoxib  (CELEBREX ) 200 MG capsule TAKE 1 CAPSULE (200 MG TOTAL) BY MOUTH 2 (TWO) TIMES DAILY. REPLACES MELOXICAM    Cholecalciferol (VITAMIN D3) 125 MCG (5000 UT) CAPS Take 1 capsule (5,000 Units total) by mouth daily.   clotrimazole  (LOTRIMIN ) 1 % cream Apply topically daily.   clotrimazole -betamethasone  (LOTRISONE ) cream APPLY TO AFFECTED AREA TWICE A DAY   empagliflozin  (JARDIANCE ) 25 MG TABS tablet Take 1 tablet (25 mg total) by mouth daily before breakfast.   Evolocumab  (REPATHA  SURECLICK) 140 MG/ML SOAJ Inject 140 mg into the skin every 14 (fourteen) days.   famotidine -calcium  carbonate-magnesium hydroxide (PEPCID  COMPLETE) 10-800-165 MG chewable tablet Chew 1 tablet by mouth 2 (two) times daily as needed.   Finerenone  (KERENDIA ) 20  MG TABS Take 1 tablet (20 mg total) by mouth daily. Replaces spironolactone  if approved, start at 10 mg for first 4 weeks (half tablets)   fluticasone  (FLONASE ) 50 MCG/ACT nasal spray Place 2 sprays into both nostrils daily.   fluticasone  furoate-vilanterol (BREO ELLIPTA ) 100-25 MCG/ACT AEPB Inhale 1 puff into the lungs daily.   furosemide  (LASIX ) 40 MG tablet Take 2 tablets (80 mg total) by mouth daily. Must take with spironolactone  to keep potassium up Reduce dose to just 40 mg daily if no fluid is accumulating.   metFORMIN  (GLUCOPHAGE ) 1000 MG tablet Take 1 tablet (1,000 mg total) by mouth 2 (two) times daily with a meal.   potassium chloride  (MICRO-K ) 10 MEQ CR capsule TAKE 1 CAPSULE BY MOUTH EVERY DAY   rosuvastatin  (CRESTOR ) 40 MG tablet Take 1 tablet (40 mg total) by mouth daily. Replaces atorvastatin  which failed to reach Low Density Lipoprotein (LDL cholesterol) goal of 55, take only if repatha  not approved/available.   spironolactone  (ALDACTONE ) 50 MG tablet Take 1 tablet (50 mg total) by mouth daily. Replaces 25 mg dose   tirzepatide  (MOUNJARO ) 15 MG/0.5ML Pen Inject 15 mg  into the skin once a week. Replaces 12.5 mg dose   No current facility-administered medications on file prior to visit.   Medications Discontinued During This Encounter  Medication Reason   olmesartan  (BENICAR ) 40 MG tablet    bisoprolol  (ZEBETA ) 10 MG tablet Dose change     Physical Exam:    08/19/2024   10:35 AM 07/17/2024    8:43 AM 05/28/2024   10:22 AM  Vitals with BMI  Height 5' 11 5' 11 5' 11  Weight 358 lbs 13 oz 361 lbs 13 oz 358 lbs 3 oz  BMI 50.06 50.48 49.98  Systolic 136 140   Diastolic 78 78   Pulse 85 78   Vital signs reviewed.  Nursing notes reviewed. Weight trend reviewed. Physical Activity: Not on file   General Appearance:  No acute distress appreciable.   Well-groomed, healthy-appearing male.  Well proportioned with no abnormal fat distribution.  Good muscle tone. Pulmonary:  Normal  work of breathing at rest, no respiratory distress apparent. SpO2: 94 %  Musculoskeletal: All extremities are intact.  Neurological:  Awake, alert, oriented, and engaged.  No obvious focal neurological deficits or cognitive impairments.  Sensorium seems unclouded.   Speech is clear and coherent with logical content. Psychiatric:  Appropriate mood, pleasant and cooperative demeanor, thoughtful and engaged during the exam   Verbalized to patient: Physical Exam VITALS: BP- 136/78 MEASUREMENTS: Weight- 358.   Results:   Verbalized to patient: Results LABS Total Cholesterol: 136 mg/dL HDL: 62-57 mg/dL LDL: 38-23 mg/dL [id number]: 3.8%     1/81/7974    9:42 AM 05/15/2024   10:34 AM 05/09/2024   10:37 AM 01/14/2024    2:16 PM  PHQ 2/9 Scores  PHQ - 2 Score 0 0 0 0  PHQ- 9 Score  0 0    Office Visit on 07/17/2024  Component Date Value Ref Range Status   WBC 07/17/2024 8.7  4.0 - 10.5 K/uL Final   RBC 07/17/2024 6.24 (H)  4.22 - 5.81 Mil/uL Final   Hemoglobin 07/17/2024 15.9  13.0 - 17.0 g/dL Final   HCT 91/92/7974 49.7  39.0 - 52.0 % Final   MCV 07/17/2024 79.6  78.0 - 100.0 fl Final   MCHC 07/17/2024 32.1  30.0 - 36.0 g/dL Final   RDW 91/92/7974 17.9 (H)  11.5 - 15.5 % Final   Platelets 07/17/2024 245.0  150.0 - 400.0 K/uL Final   Neutrophils Relative % 07/17/2024 72.6  43.0 - 77.0 % Final   Lymphocytes Relative 07/17/2024 18.5  12.0 - 46.0 % Final   Monocytes Relative 07/17/2024 7.1  3.0 - 12.0 % Final   Eosinophils Relative 07/17/2024 1.3  0.0 - 5.0 % Final   Basophils Relative 07/17/2024 0.5  0.0 - 3.0 % Final   Neutro Abs 07/17/2024 6.3  1.4 - 7.7 K/uL Final   Lymphs Abs 07/17/2024 1.6  0.7 - 4.0 K/uL Final   Monocytes Absolute 07/17/2024 0.6  0.1 - 1.0 K/uL Final   Eosinophils Absolute 07/17/2024 0.1  0.0 - 0.7 K/uL Final   Basophils Absolute 07/17/2024 0.0  0.0 - 0.1 K/uL Final   Sodium 07/17/2024 139  135 - 145 mEq/L Final   Potassium 07/17/2024 4.8  3.5 - 5.1 mEq/L Final    Chloride 07/17/2024 102  96 - 112 mEq/L Final   CO2 07/17/2024 25  19 - 32 mEq/L Final   Glucose, Bld 07/17/2024 85  70 - 99 mg/dL Final   BUN 91/92/7974 16  6 - 23  mg/dL Final   Creatinine, Ser 07/17/2024 1.39  0.40 - 1.50 mg/dL Final   Total Bilirubin 07/17/2024 0.5  0.2 - 1.2 mg/dL Final   Alkaline Phosphatase 07/17/2024 67  39 - 117 U/L Final   AST 07/17/2024 17  0 - 37 U/L Final   ALT 07/17/2024 19  0 - 53 U/L Final   Total Protein 07/17/2024 7.4  6.0 - 8.3 g/dL Final   Albumin 91/92/7974 4.2  3.5 - 5.2 g/dL Final   GFR 91/92/7974 54.66 (L)  >60.00 mL/min Final   Calcium  07/17/2024 10.0  8.4 - 10.5 mg/dL Final   Cholesterol 91/92/7974 136  0 - 200 mg/dL Final   Triglycerides 91/92/7974 86.0  0.0 - 149.0 mg/dL Final   HDL 91/92/7974 42.80  >39.00 mg/dL Final   VLDL 91/92/7974 17.2  0.0 - 40.0 mg/dL Final   LDL Cholesterol 07/17/2024 76  0 - 99 mg/dL Final   Total CHOL/HDL Ratio 07/17/2024 3   Final   NonHDL 07/17/2024 93.49   Final   Hgb A1c MFr Bld 07/17/2024 6.7 (H)  4.6 - 6.5 % Final   Microalb, Ur 07/17/2024 3.8 (H)  0.0 - 1.9 mg/dL Final   Creatinine,U 91/92/7974 176.2  mg/dL Final   Microalb Creat Ratio 07/17/2024 21.7  0.0 - 30.0 mg/g Final   VITD 07/17/2024 59.82  30.00 - 100.00 ng/mL Final  Office Visit on 04/14/2024  Component Date Value Ref Range Status   Sodium 04/14/2024 139  135 - 145 mEq/L Final   Potassium 04/14/2024 4.2  3.5 - 5.1 mEq/L Final   Chloride 04/14/2024 105  96 - 112 mEq/L Final   CO2 04/14/2024 25  19 - 32 mEq/L Final   Glucose, Bld 04/14/2024 110 (H)  70 - 99 mg/dL Final   BUN 94/94/7974 13  6 - 23 mg/dL Final   Creatinine, Ser 04/14/2024 1.38  0.40 - 1.50 mg/dL Final   Total Bilirubin 04/14/2024 0.6  0.2 - 1.2 mg/dL Final   Alkaline Phosphatase 04/14/2024 63  39 - 117 U/L Final   AST 04/14/2024 15  0 - 37 U/L Final   ALT 04/14/2024 15  0 - 53 U/L Final   Total Protein 04/14/2024 6.5  6.0 - 8.3 g/dL Final   Albumin 94/94/7974 4.0  3.5 - 5.2  g/dL Final   GFR 94/94/7974 55.24 (L)  >60.00 mL/min Final   Calcium  04/14/2024 9.0  8.4 - 10.5 mg/dL Final   VITD 94/94/7974 23.85 (L)  30.00 - 100.00 ng/mL Final   Creatinine, Urine 04/14/2024 156  20 - 320 mg/dL Final   Protein/Creat Ratio 04/14/2024 545 (H)  25 - 148 mg/g creat Final   Protein/Creatinine Ratio 04/14/2024 0.545 (H)  0.025 - 0.148 mg/mg creat Final   Total Protein, Urine 04/14/2024 85 (H)  5 - 25 mg/dL Final   PSA 94/94/7974 11.46 (H)  0.10 - 4.00 ng/mL Final   Hgb A1c MFr Bld 04/14/2024 6.5  4.6 - 6.5 % Final   WBC 04/14/2024 8.5  4.0 - 10.5 K/uL Final   RBC 04/14/2024 6.00 (H)  4.22 - 5.81 Mil/uL Final   Hemoglobin 04/14/2024 15.6  13.0 - 17.0 g/dL Final   HCT 94/94/7974 47.7  39.0 - 52.0 % Final   MCV 04/14/2024 79.5  78.0 - 100.0 fl Final   MCHC 04/14/2024 32.8  30.0 - 36.0 g/dL Final   RDW 94/94/7974 16.3 (H)  11.5 - 15.5 % Final   Platelets 04/14/2024 175.0  150.0 - 400.0 K/uL  Final   Neutrophils Relative % 04/14/2024 72.9  43.0 - 77.0 % Final   Lymphocytes Relative 04/14/2024 17.2  12.0 - 46.0 % Final   Monocytes Relative 04/14/2024 6.2  3.0 - 12.0 % Final   Eosinophils Relative 04/14/2024 3.1  0.0 - 5.0 % Final   Basophils Relative 04/14/2024 0.6  0.0 - 3.0 % Final   Neutro Abs 04/14/2024 6.2  1.4 - 7.7 K/uL Final   Lymphs Abs 04/14/2024 1.5  0.7 - 4.0 K/uL Final   Monocytes Absolute 04/14/2024 0.5  0.1 - 1.0 K/uL Final   Eosinophils Absolute 04/14/2024 0.3  0.0 - 0.7 K/uL Final   Basophils Absolute 04/14/2024 0.1  0.0 - 0.1 K/uL Final  Office Visit on 01/14/2024  Component Date Value Ref Range Status   WBC 01/14/2024 9.1  4.0 - 10.5 K/uL Final   RBC 01/14/2024 5.99 (H)  4.22 - 5.81 Mil/uL Final   Hemoglobin 01/14/2024 15.4  13.0 - 17.0 g/dL Final   HCT 97/96/7974 47.8  39.0 - 52.0 % Final   MCV 01/14/2024 79.7  78.0 - 100.0 fl Final   MCHC 01/14/2024 32.2  30.0 - 36.0 g/dL Final   RDW 97/96/7974 17.8 (H)  11.5 - 15.5 % Final   Platelets 01/14/2024  220.0  150.0 - 400.0 K/uL Final   Neutrophils Relative % 01/14/2024 74.3  43.0 - 77.0 % Final   Lymphocytes Relative 01/14/2024 18.1  12.0 - 46.0 % Final   Monocytes Relative 01/14/2024 4.8  3.0 - 12.0 % Final   Eosinophils Relative 01/14/2024 1.9  0.0 - 5.0 % Final   Basophils Relative 01/14/2024 0.9  0.0 - 3.0 % Final   Neutro Abs 01/14/2024 6.7  1.4 - 7.7 K/uL Final   Lymphs Abs 01/14/2024 1.6  0.7 - 4.0 K/uL Final   Monocytes Absolute 01/14/2024 0.4  0.1 - 1.0 K/uL Final   Eosinophils Absolute 01/14/2024 0.2  0.0 - 0.7 K/uL Final   Basophils Absolute 01/14/2024 0.1  0.0 - 0.1 K/uL Final   Sodium 01/14/2024 140  135 - 145 mEq/L Final   Potassium 01/14/2024 4.3  3.5 - 5.1 mEq/L Final   Chloride 01/14/2024 103  96 - 112 mEq/L Final   CO2 01/14/2024 28  19 - 32 mEq/L Final   Glucose, Bld 01/14/2024 89  70 - 99 mg/dL Final   BUN 97/96/7974 14  6 - 23 mg/dL Final   Creatinine, Ser 01/14/2024 1.33  0.40 - 1.50 mg/dL Final   Total Bilirubin 01/14/2024 0.5  0.2 - 1.2 mg/dL Final   Alkaline Phosphatase 01/14/2024 67  39 - 117 U/L Final   AST 01/14/2024 16  0 - 37 U/L Final   ALT 01/14/2024 15  0 - 53 U/L Final   Total Protein 01/14/2024 7.1  6.0 - 8.3 g/dL Final   Albumin 97/96/7974 4.1  3.5 - 5.2 g/dL Final   GFR 97/96/7974 57.84 (L)  >60.00 mL/min Final   Calcium  01/14/2024 9.3  8.4 - 10.5 mg/dL Final   Cholesterol 97/96/7974 125  0 - 200 mg/dL Final   Triglycerides 97/96/7974 93.0  0.0 - 149.0 mg/dL Final   HDL 97/96/7974 40.10  >39.00 mg/dL Final   VLDL 97/96/7974 18.6  0.0 - 40.0 mg/dL Final   LDL Cholesterol 01/14/2024 66  0 - 99 mg/dL Final   Total CHOL/HDL Ratio 01/14/2024 3   Final   NonHDL 01/14/2024 84.77   Final   Hgb A1c MFr Bld 01/14/2024 6.3  4.6 - 6.5 %  Final   Interpretation - HGBRFX 01/14/2024 see note   Final   Reviewed By: 01/14/2024 see note   Final   Hgb A 01/14/2024 97.5  >96.0 % Final   Fetal Hemoglobin Testing 01/14/2024 0.0  <2.0 % Final   Hemoglobin A2 -  HGBRFX 01/14/2024 2.5  2.0 - 3.2 % Final   C-Z Electrophoresis 01/14/2024 Confirms   Final   RBC 01/14/2024 6.15 (H)  4.20 - 5.80 Mill/uL Final   Hemoglobin 01/14/2024 15.5  13.2 - 17.1 g/dL Final   HCT 97/96/7974 50.2 (H)  38.5 - 50.0 % Final   MCV 01/14/2024 81.6  80.0 - 100.0 fL Final   MCH 01/14/2024 25.2 (L)  27.0 - 33.0 pg Final   MCHC 01/14/2024 30.9 (L)  32.0 - 36.0 g/dL Final   RDW 97/96/7974 17.3 (H)  11.0 - 15.0 % Final   Ferritin 01/14/2024 55  24 - 380 ng/mL Final   VITD 01/14/2024 32.30  30.00 - 100.00 ng/mL Final   PTH 01/14/2024 35  16 - 77 pg/mL Final   PSA, Total 01/14/2024 11.7 (H)  < OR = 4.0 ng/mL Final   PSA, Free 01/14/2024 1.0  ng/mL Final   PSA, % Free 01/14/2024 NOT CALCULATED  >25 % (calc) Final   Magnesium 01/14/2024 1.7  1.5 - 2.5 mg/dL Final   Uric Acid, Serum 01/14/2024 5.4  4.0 - 7.8 mg/dL Final   Ferritin 97/96/7974 44.0  22.0 - 322.0 ng/mL Final   Vitamin B-12 01/14/2024 212  211 - 911 pg/mL Final   Summary Report 01/14/2024 see note   Final   Reviewed By: 01/14/2024 see note   Final   Results Recieved 01/14/2024 01/25/24   Final   Alpha-Thalassemia 01/14/2024 see note   Final  Office Visit on 10/05/2023  Component Date Value Ref Range Status   Vit D, 25-Hydroxy 10/05/2023 10 (L)  30 - 100 ng/mL Final   Uric Acid, Serum 10/05/2023 5.4  4.0 - 8.0 mg/dL Final   PTH 89/74/7975 13 (L)  16 - 77 pg/mL Final   Phosphorus 10/05/2023 3.2  2.5 - 4.5 mg/dL Final   Magnesium 89/74/7975 1.9  1.5 - 2.5 mg/dL Final   Glucose, Bld 89/74/7975 85  65 - 99 mg/dL Final   BUN 89/74/7975 14  7 - 25 mg/dL Final   Creat 89/74/7975 1.22  0.70 - 1.35 mg/dL Final   BUN/Creatinine Ratio 10/05/2023 SEE NOTE:  6 - 22 (calc) Final   Sodium 10/05/2023 141  135 - 146 mmol/L Final   Potassium 10/05/2023 4.6  3.5 - 5.3 mmol/L Final   Chloride 10/05/2023 102  98 - 110 mmol/L Final   CO2 10/05/2023 28  20 - 32 mmol/L Final   Calcium  10/05/2023 9.8  8.6 - 10.3 mg/dL Final  Office  Visit on 09/07/2023  Component Date Value Ref Range Status   WBC 09/07/2023 9.5  3.8 - 10.8 Thousand/uL Final   RBC 09/07/2023 6.28 (H)  4.20 - 5.80 Million/uL Final   Hemoglobin 09/07/2023 15.6  13.2 - 17.1 g/dL Final   HCT 90/72/7975 49.5  38.5 - 50.0 % Final   MCV 09/07/2023 78.8 (L)  80.0 - 100.0 fL Final   MCH 09/07/2023 24.8 (L)  27.0 - 33.0 pg Final   MCHC 09/07/2023 31.5 (L)  32.0 - 36.0 g/dL Final   RDW 90/72/7975 14.8  11.0 - 15.0 % Final   Platelets 09/07/2023 270  140 - 400 Thousand/uL Final   MPV 09/07/2023 10.8  7.5 - 12.5 fL Final   Neutro Abs 09/07/2023 6,603  1,500 - 7,800 cells/uL Final   Lymphs Abs 09/07/2023 1,957  850 - 3,900 cells/uL Final   Absolute Monocytes 09/07/2023 751  200 - 950 cells/uL Final   Eosinophils Absolute 09/07/2023 133  15 - 500 cells/uL Final   Basophils Absolute 09/07/2023 57  0 - 200 cells/uL Final   Neutrophils Relative % 09/07/2023 69.5  % Final   Total Lymphocyte 09/07/2023 20.6  % Final   Monocytes Relative 09/07/2023 7.9  % Final   Eosinophils Relative 09/07/2023 1.4  % Final   Basophils Relative 09/07/2023 0.6  % Final   Glucose, Bld 09/07/2023 78  65 - 99 mg/dL Final   BUN 90/72/7975 14  7 - 25 mg/dL Final   Creat 90/72/7975 1.24  0.70 - 1.35 mg/dL Final   BUN/Creatinine Ratio 09/07/2023 SEE NOTE:  6 - 22 (calc) Final   Sodium 09/07/2023 140  135 - 146 mmol/L Final   Potassium 09/07/2023 4.0  3.5 - 5.3 mmol/L Final   Chloride 09/07/2023 106  98 - 110 mmol/L Final   CO2 09/07/2023 22  20 - 32 mmol/L Final   Calcium  09/07/2023 9.2  8.6 - 10.3 mg/dL Final   Total Protein 90/72/7975 6.7  6.1 - 8.1 g/dL Final   Albumin 90/72/7975 4.1  3.6 - 5.1 g/dL Final   Globulin 90/72/7975 2.6  1.9 - 3.7 g/dL (calc) Final   AG Ratio 09/07/2023 1.6  1.0 - 2.5 (calc) Final   Total Bilirubin 09/07/2023 0.4  0.2 - 1.2 mg/dL Final   Alkaline phosphatase (APISO) 09/07/2023 75  35 - 144 U/L Final   AST 09/07/2023 21  10 - 35 U/L Final   ALT 09/07/2023  24  9 - 46 U/L Final   Cholesterol 09/07/2023 113  <200 mg/dL Final   HDL 90/72/7975 37 (L)  > OR = 40 mg/dL Final   Triglycerides 90/72/7975 73  <150 mg/dL Final   LDL Cholesterol (Calc) 09/07/2023 61  mg/dL (calc) Final   Total CHOL/HDL Ratio 09/07/2023 3.1  <4.9 (calc) Final   Non-HDL Cholesterol (Calc) 09/07/2023 76  <130 mg/dL (calc) Final   Hgb J8r MFr Bld 09/07/2023 6.7 (H)  <5.7 % of total Hgb Final   Mean Plasma Glucose 09/07/2023 146  mg/dL Final   eAG (mmol/L) 90/72/7975 8.1  mmol/L Final   Creatinine, Urine 09/07/2023 218  20 - 320 mg/dL Final   Microalb, Ur 90/72/7975 8.5  mg/dL Final   Microalb Creat Ratio 09/07/2023 39 (H)  <30 mg/g creat Final  Office Visit on 05/08/2023  Component Date Value Ref Range Status   PSA 05/08/2023 11.53 (H)  0.10 - 4.00 ng/mL Final   Sodium 05/08/2023 139  135 - 145 mEq/L Final   Potassium 05/08/2023 3.9  3.5 - 5.1 mEq/L Final   Chloride 05/08/2023 105  96 - 112 mEq/L Final   CO2 05/08/2023 24  19 - 32 mEq/L Final   Glucose, Bld 05/08/2023 140 (H)  70 - 99 mg/dL Final   BUN 94/71/7975 13  6 - 23 mg/dL Final   Creatinine, Ser 05/08/2023 1.12  0.40 - 1.50 mg/dL Final   GFR 94/71/7975 71.43  >60.00 mL/min Final   Calcium  05/08/2023 9.0  8.4 - 10.5 mg/dL Final   WBC 94/71/7975 7.9  4.0 - 10.5 K/uL Final   RBC 05/08/2023 5.90 (H)  4.22 - 5.81 Mil/uL Final   Hemoglobin 05/08/2023 14.5  13.0 - 17.0 g/dL Final  HCT 05/08/2023 45.5  39.0 - 52.0 % Final   MCV 05/08/2023 77.1 (L)  78.0 - 100.0 fl Final   MCHC 05/08/2023 31.8  30.0 - 36.0 g/dL Final   RDW 94/71/7975 18.3 (H)  11.5 - 15.5 % Final   Platelets 05/08/2023 196.0  150.0 - 400.0 K/uL Final   Neutrophils Relative % 05/08/2023 73.8  43.0 - 77.0 % Final   Lymphocytes Relative 05/08/2023 18.4  12.0 - 46.0 % Final   Monocytes Relative 05/08/2023 5.3  3.0 - 12.0 % Final   Eosinophils Relative 05/08/2023 2.0  0.0 - 5.0 % Final   Basophils Relative 05/08/2023 0.5  0.0 - 3.0 % Final   Neutro  Abs 05/08/2023 5.8  1.4 - 7.7 K/uL Final   Lymphs Abs 05/08/2023 1.4  0.7 - 4.0 K/uL Final   Monocytes Absolute 05/08/2023 0.4  0.1 - 1.0 K/uL Final   Eosinophils Absolute 05/08/2023 0.2  0.0 - 0.7 K/uL Final   Basophils Absolute 05/08/2023 0.0  0.0 - 0.1 K/uL Final   Ferritin 05/08/2023 38.7  22.0 - 322.0 ng/mL Final  No results displayed because visit has over 200 results.    Office Visit on 12/26/2022  Component Date Value Ref Range Status   WBC 12/26/2022 10.1  4.0 - 10.5 K/uL Final   RBC 12/26/2022 5.71  4.22 - 5.81 Mil/uL Final   Platelets 12/26/2022 331.0  150.0 - 400.0 K/uL Final   Hemoglobin 12/26/2022 14.3  13.0 - 17.0 g/dL Final   HCT 98/83/7975 44.1  39.0 - 52.0 % Final   MCV 12/26/2022 77.2 (L)  78.0 - 100.0 fl Final   MCHC 12/26/2022 32.5  30.0 - 36.0 g/dL Final   RDW 98/83/7975 17.2 (H)  11.5 - 15.5 % Final   Sodium 12/26/2022 141  135 - 145 mEq/L Final   Potassium 12/26/2022 4.3  3.5 - 5.1 mEq/L Final   Chloride 12/26/2022 103  96 - 112 mEq/L Final   CO2 12/26/2022 29  19 - 32 mEq/L Final   Glucose, Bld 12/26/2022 103 (H)  70 - 99 mg/dL Final   BUN 98/83/7975 11  6 - 23 mg/dL Final   Creatinine, Ser 12/26/2022 1.17  0.40 - 1.50 mg/dL Final   Total Bilirubin 12/26/2022 0.5  0.2 - 1.2 mg/dL Final   Alkaline Phosphatase 12/26/2022 95  39 - 117 U/L Final   AST 12/26/2022 13  0 - 37 U/L Final   ALT 12/26/2022 12  0 - 53 U/L Final   Total Protein 12/26/2022 7.2  6.0 - 8.3 g/dL Final   Albumin 98/83/7975 4.0  3.5 - 5.2 g/dL Final   GFR 98/83/7975 67.96  >60.00 mL/min Final   Calcium  12/26/2022 9.5  8.4 - 10.5 mg/dL Final   Cholesterol 98/83/7975 139  0 - 200 mg/dL Final   Triglycerides 98/83/7975 74.0  0.0 - 149.0 mg/dL Final   HDL 98/83/7975 30.50 (L)  >60.99 mg/dL Final   VLDL 98/83/7975 14.8  0.0 - 40.0 mg/dL Final   LDL Cholesterol 12/26/2022 94  0 - 99 mg/dL Final   Total CHOL/HDL Ratio 12/26/2022 5   Final   NonHDL 12/26/2022 108.76   Final   MICRO NUMBER:  12/26/2022 [id number]   Final   SPECIMEN QUALITY: 12/26/2022 Adequate   Final   Sample Source 12/26/2022 URINE   Final   STATUS: 12/26/2022 FINAL   Final   ISOLATE 1: 12/26/2022 Klebsiella pneumoniae (A)   Final   Color, UA 12/26/2022 yellow   Final  Clarity, UA 12/26/2022 cloudy   Final   Glucose, UA 12/26/2022 Negative  Negative Final   Bilirubin, UA 12/26/2022 Negative   Final   Ketones, UA 12/26/2022 Negative   Final   Spec Grav, UA 12/26/2022 1.015  1.010 - 1.025 Final   Blood, UA 12/26/2022 Negative   Final   pH, UA 12/26/2022 5.0  5.0 - 8.0 Final   Protein, UA 12/26/2022 Negative  Negative Final   Urobilinogen, UA 12/26/2022 0.2  0.2 or 1.0 E.U./dL Final   Nitrite, UA 98/83/7975 Postive   Final   Leukocytes, UA 12/26/2022 Moderate (2+) (A)  Negative Final  No image results found. XR KNEE 3 VIEW LEFT Result Date: 05/28/2024 AP, lateral, sunrise views of left knee reviewed.  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  Varus alignment noted.  No fracture or dislocation.  No abnormal patellar height.  XR KNEE 3 VIEW RIGHT Result Date: 05/28/2024 AP, lateral, sunrise views of right knee reviewed.  Severe end-stage tricompartmental arthritis noted worst in the medial compartment.  No fracture or dislocation.  No significant abnormal patellar height.        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 Renal function has declined slightly since 2024 but remains stable. Emphasized the importance of maintaining good glycemic control to prevent further renal damage. Dyslipidemia Hyperlipidemia   Cholesterol levels are well-controlled with current medications, including evolocumab  and rosuvastatin . Discussed the addition of ezetimibe   (Zetia ) to further reduce the risk of cardiovascular events and protect against cerebrovascular issues. Ezetimibe  has minimal side effects but also limited benefits, though it may reduce the risk of dementia and cerebrovascular problems by about sevenfold. Add ezetimibe  (Zetia ) to current regimen. Resistant hypertension Resistant hypertension   Blood pressure remains suboptimal at 136/78 mmHg, which is not adequate for a patient with diabetes. Current regimen includes bisoprolol , olmesartan , and spironolactone . AI tool suggested reducing bisoprolol  due to potential side effects and lack of efficacy in lowering blood pressure. Discussed potential addition of a diuretic to improve blood pressure control. Reduce bisoprolol  to one tablet daily and add hydrochlorothiazide  to improve blood pressure and fluid management, with caution due to potential renal impact. Check renal function one to two weeks after starting hydrochlorothiazide . Morbid obesity with BMI of 50.0-59.9, adult (HCC) Morbid obesity with BMI =50 and consideration of bariatric surgery   BMI remains over 50, indicating morbid obesity. Weight has plateaued despite maximum dose of tirzepatide . Discussed potential for bariatric surgery if significant weight loss is not achieved in the next two months. Emphasized the importance of weight loss to prevent cardiovascular complications, including MACE, stroke, and myocardial infarction. Bariatric surgery could result in a significant weight loss of 150-200 pounds, reducing long-term health risks. Add another weight loss medication and discuss non-drug based approaches to weight loss. Consider referral for bariatric surgery if no significant weight loss in two months. Chronic diastolic heart failure (HCC) Chronic diastolic heart failure   Chronic diastolic heart failure is being managed with beta blocker, lasix , spironolactone - still some swelling Consider entresto.    ORDER ASSOCIATIONS  #    DIAGNOSIS / CONDITION ICD-10 ENCOUNTER ORDER     ICD-10-CM   1. Type 2 diabetes mellitus with other specified complication, without long-term current use of insulin  (HCC)  E11.69 Microalbumin / creatinine  urine ratio    Basic Metabolic Panel (BMET)    2. Dyslipidemia  E78.5 ezetimibe  (ZETIA ) 10 MG tablet    3. Resistant hypertension  I1A.0 bisoprolol  (ZEBETA ) 10 MG tablet    hydrochlorothiazide  (HYDRODIURIL ) 25 MG tablet    Microalbumin / creatinine urine ratio    Basic Metabolic Panel (BMET)    CANCELED: Basic Metabolic Panel (BMET)    4. Morbid obesity with BMI of 50.0-59.9, adult (HCC)  E66.01    Z68.43     5. Chronic diastolic heart failure (HCC)  P49.67            Orders Placed in Encounter:   Lab Orders         Microalbumin / creatinine urine ratio         Basic Metabolic Panel (BMET)      Meds ordered this encounter  Medications   ezetimibe  (ZETIA ) 10 MG tablet    Sig: Take 1 tablet (10 mg total) by mouth daily.    Dispense:  90 tablet    Refill:  3   bisoprolol  (ZEBETA ) 10 MG tablet    Sig: Take 1 tablet (10 mg total) by mouth daily.    Dispense:  90 tablet    Refill:  4   hydrochlorothiazide  (HYDRODIURIL ) 25 MG tablet    Sig: Take 1 tablet (25 mg total) by mouth daily. Only Half tablet daily am until kidneys checked    Dispense:  90 tablet    Refill:  3    ED Discharge Orders          Ordered    ezetimibe  (ZETIA ) 10 MG tablet  Daily        08/19/24 1057    bisoprolol  (ZEBETA ) 10 MG tablet  Daily        08/19/24 1114    Basic Metabolic Panel (BMET)  Status:  Canceled        08/19/24 1114    hydrochlorothiazide  (HYDRODIURIL ) 25 MG tablet  Daily        08/19/24 1114    Microalbumin / creatinine urine ratio        08/19/24 1905    Basic Metabolic Panel (BMET)        08/19/24 1905              This document was synthesized by artificial intelligence ([employer]) using HIPAA-compliant recording of the clinical interaction;   We discussed the use of  AI scribe software for clinical note transcription with the patient, who gave verbal consent to proceed. additional Info: This encounter employed state-of-the-art, real-time, collaborative documentation. The patient actively reviewed and assisted in updating their electronic medical record on a shared screen, ensuring transparency and facilitating joint problem-solving for the problem list, overview, and plan. This approach promotes accurate, informed care. The treatment plan was discussed and reviewed in detail, including medication safety, potential side effects, and all patient questions. We confirmed understanding and comfort with the plan. Follow-up instructions were established, including contacting the office for any concerns, returning if symptoms worsen, persist, or new symptoms develop, and precautions for potential emergency department visits.

## 2024-08-19 NOTE — Patient Instructions (Signed)
 It was a pleasure seeing you today! Your health and satisfaction are our top priorities.  Bernardino Cone, MD  VISIT SUMMARY: Today, you came in for a review of your weight management and medications. We discussed your progress with weight loss, your diabetes management, blood pressure, heart health, and cholesterol levels. You have made significant progress in your weight loss journey and have joined a gym to further aid in this effort. We also reviewed your current medications and made some adjustments to better manage your health conditions.  YOUR PLAN: -MORBID OBESITY WITH BMI =50 AND CONSIDERATION OF BARIATRIC SURGERY: Morbid obesity means having a very high body weight, which can lead to serious health problems. Your weight has plateaued despite the current medication. We discussed the potential for bariatric surgery if significant weight loss is not achieved in the next two months. We will add another weight loss medication and explore non-drug based approaches to weight loss. If there is no significant weight loss in two months, we will consider referring you for bariatric surgery.  -RESISTANT HYPERTENSION: Resistant hypertension means your blood pressure remains high despite taking multiple medications. Your current blood pressure is 136/78 mmHg, which is not ideal for someone with diabetes. We will reduce your bisoprolol  to one tablet daily and add hydrochlorothiazide  to help improve your blood pressure and manage fluid. We will check your kidney function one to two weeks after starting the new medication.  -CHRONIC DIASTOLIC HEART FAILURE: Chronic diastolic heart failure means your heart has difficulty relaxing and filling with blood. Your condition is being managed with your current medications.  -TYPE 2 DIABETES MELLITUS WITH DIABETIC NEPHROPATHY AND PROTEINURIA: Type 2 diabetes means your body has trouble using insulin  properly, leading to high blood sugar levels. Diabetic nephropathy and  proteinuria mean your diabetes has affected your kidneys. Your diabetes is well-controlled with an A1c of 6.1. It is important to maintain good blood sugar control to prevent further kidney damage.  -HYPERLIPIDEMIA: Hyperlipidemia means having high levels of cholesterol in your blood. Your cholesterol levels are well-controlled with your current medications. We will add ezetimibe  (Zetia ) to further reduce the risk of cardiovascular events and protect against cerebrovascular issues.  INSTRUCTIONS: Please follow up in two months to review your weight loss progress and consider a referral for bariatric surgery if needed. Start taking hydrochlorothiazide  as prescribed and check your kidney function in one to two weeks. Continue with your current medications and add ezetimibe  (Zetia ) to your regimen. Maintain your gym routine and monitor your blood pressure and blood sugar levels regularly.  Your Providers PCP: Cone Bernardino MATSU, MD,  [phone]) Referring Provider: Cone Bernardino MATSU, MD,  [phone]) Care Team Provider: Debera Jayson MATSU, MD,  [phone]) Care Team Provider: Margrette Taft BRAVO, MD,  [phone])  NEXT STEPS: [x]  Early Intervention: Schedule sooner appointment, call our on-call services, or go to emergency room if there is any significant Increase in pain or discomfort New or worsening symptoms Sudden or severe changes in your health [x]  Flexible Follow-Up: We recommend a Return in about 2 months (around 10/19/2024). for optimal routine care. This allows for progress monitoring and treatment adjustments. [x]  Preventive Care: Schedule your annual preventive care visit! It's typically covered by insurance and helps identify potential health issues early. [x]  Lab & X-ray Appointments: Incomplete tests scheduled today, or call to schedule. X-rays: [facility] (M-F, 8:30am-noon or 1pm-5pm). [x]  Medical Information Release: Sign a release form at front desk to  obtain relevant medical information we don't  have.  MAKING THE MOST OF OUR FOCUSED 20 MINUTE APPOINTMENTS: [x]   Clearly state your top concerns at the beginning of the visit to focus our discussion [x]   If you anticipate you will need more time, please inform the front desk during scheduling - we can book multiple appointments in the same week. [x]   If you have transportation problems- use our convenient video appointments or ask about transportation support. [x]   We can get down to business faster if you use MyChart to update information before the visit and submit non-urgent questions before your visit. Thank you for taking the time to provide details through MyChart.  Let our nurse know and she can import this information into your encounter documents.  Arrival and Wait Times: [x]   Arriving on time ensures that everyone receives prompt attention. [x]   Early morning (8a) and afternoon (1p) appointments tend to have shortest wait times. [x]   Unfortunately, we cannot delay appointments for late arrivals or hold slots during phone calls.  Getting Answers and Following Up [x]   Simple Questions & Concerns: For quick questions or basic follow-up after your visit, reach us  at [phone] or MyChart messaging. [x]   Complex Concerns: If your concern is more complex, scheduling an appointment might be best. Discuss this with the staff to find the most suitable option. [x]   Lab & Imaging Results: We'll contact you directly if results are abnormal or you don't use MyChart. Most normal results will be on MyChart within 2-3 business days, with a review message from Dr. Jesus. Haven't heard back in 2 weeks? Need results sooner? Contact us  at [phone]. [x]   Referrals: Our referral coordinator will manage specialist referrals. The specialist's office should contact you within 2 weeks to schedule an appointment. Call us  if you haven't heard from them after 2 weeks.  Staying Connected [x]   MyChart:  Activate your MyChart for the fastest way to access results and message us . See the last page of this paperwork for instructions on how to activate.  Bring to Your Next Appointment [x]   Medications: Please bring all your medication bottles to your next appointment to ensure we have an accurate record of your prescriptions. [x]   Health Diaries: If you're monitoring any health conditions at home, keeping a diary of your readings can be very helpful for discussions at your next appointment.  Billing [x]   X-ray & Lab Orders: These are billed by separate companies. Contact the invoicing company directly for questions or concerns. [x]   Visit Charges: Discuss any billing inquiries with our administrative services team.  Your Satisfaction Matters [x]   Share Your Experience: We strive for your satisfaction! If you have any complaints, or preferably compliments, please let Dr. Jesus know directly or contact our Practice Administrators, Manuelita Rubin or [employer], by asking at the front desk.   Reviewing Your Records [x]   Review this early draft of your clinical encounter notes below and the final encounter summary tomorrow on MyChart after its been completed.  All orders placed so far are visible here: Type 2 diabetes mellitus with other specified complication, without long-term current use of insulin  (HCC) -     Microalbumin / creatinine urine ratio; Future -     Basic metabolic panel with GFR; Future  Dyslipidemia -     Ezetimibe ; Take 1 tablet (10 mg total) by mouth daily.  Dispense: 90 tablet; Refill: 3  Resistant hypertension -     Bisoprolol  Fumarate; Take 1 tablet (10 mg total) by mouth daily.  Dispense: 90 tablet; Refill: 4 -     hydroCHLOROthiazide ; Take 1 tablet (25 mg total) by mouth daily. Only Half tablet daily am until kidneys checked  Dispense: 90 tablet; Refill: 3 -     Microalbumin / creatinine urine ratio; Future -     Basic metabolic panel with GFR; Future  Morbid obesity  with BMI of 50.0-59.9, adult (HCC)  Chronic diastolic heart failure (HCC)

## 2024-08-19 NOTE — Assessment & Plan Note (Signed)
 Morbid obesity with BMI =50 and consideration of bariatric surgery   BMI remains over 50, indicating morbid obesity. Weight has plateaued despite maximum dose of tirzepatide . Discussed potential for bariatric surgery if significant weight loss is not achieved in the next two months. Emphasized the importance of weight loss to prevent cardiovascular complications, including MACE, stroke, and myocardial infarction. Bariatric surgery could result in a significant weight loss of 150-200 pounds, reducing long-term health risks. Add another weight loss medication and discuss non-drug based approaches to weight loss. Consider referral for bariatric surgery if no significant weight loss in two months.

## 2024-08-19 NOTE — Assessment & Plan Note (Signed)
 Resistant hypertension   Blood pressure remains suboptimal at 136/78 mmHg, which is not adequate for a patient with diabetes. Current regimen includes bisoprolol , olmesartan , and spironolactone . AI tool suggested reducing bisoprolol  due to potential side effects and lack of efficacy in lowering blood pressure. Discussed potential addition of a diuretic to improve blood pressure control. Reduce bisoprolol  to one tablet daily and add hydrochlorothiazide  to improve blood pressure and fluid management, with caution due to potential renal impact. Check renal function one to two weeks after starting hydrochlorothiazide .

## 2024-08-19 NOTE — Assessment & Plan Note (Signed)
 Chronic diastolic heart failure   Chronic diastolic heart failure is being managed with beta blocker, lasix , spironolactone - still some swelling Consider entresto.

## 2024-08-19 NOTE — Assessment & Plan Note (Signed)
 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 Renal function has declined slightly since 2024 but remains stable. Emphasized the importance of maintaining good glycemic control to prevent further renal damage.

## 2024-08-20 ENCOUNTER — Telehealth: Payer: Self-pay | Admitting: Internal Medicine

## 2024-08-20 NOTE — Telephone Encounter (Signed)
 Spoke with pt about what he is needing stated that the insurance company called him and ask for medical documentation or note sent to them stating why pt is not returning to work and why his on disability.

## 2024-08-20 NOTE — Telephone Encounter (Unsigned)
 Copied from CRM #[id number]. Topic: General - Other >> Aug 20, 2024 11:43 AM Robinson H wrote: Reason for CRM: Patient states he received a call today from the insurance company needing medical documentation or a note stating why patient will not be returning to work, patient states he's on disability and not understanding what he has to do, please reach out.  [place] [phone]

## 2024-08-28 NOTE — Telephone Encounter (Unsigned)
 Copied from CRM [id number]. Topic: Referral - Request for Referral >> Aug 28, 2024 12:32 PM Zy'onna H wrote: Did the patient discuss referral with their provider in the last year? No (If No - schedule appointment) (If Yes - send message)  Appointment offered? Yes  Type of order/referral and detailed reason for visit: Patient is seeking to have a referral/form stating from Jesus Bernardino MATSU, MD - that he can no longer work due to his disability.   Preference of office, provider, location: [address], [place], [place] [address]  If referral order, have you been seen by this specialty before? No (If Yes, this issue or another issue? When? Where?  Can we respond through MyChart? Yes

## 2024-08-28 NOTE — Telephone Encounter (Signed)
 Spoke with pt about paper work pt states he has appt with pcp on sept 30th for this and he has appt 28th with urology for his referral.

## 2024-09-02 ENCOUNTER — Other Ambulatory Visit: Payer: Self-pay | Admitting: Internal Medicine

## 2024-09-02 DIAGNOSIS — M170 Bilateral primary osteoarthritis of knee: Secondary | ICD-10-CM

## 2024-09-09 ENCOUNTER — Ambulatory Visit: Admitting: Internal Medicine

## 2024-09-09 ENCOUNTER — Encounter: Payer: Self-pay | Admitting: Internal Medicine

## 2024-09-09 VITALS — BP 126/72 | HR 60 | Temp 98.0°F | Ht 71.0 in | Wt 359.2 lb

## 2024-09-09 DIAGNOSIS — M170 Bilateral primary osteoarthritis of knee: Secondary | ICD-10-CM | POA: Diagnosis not present

## 2024-09-09 DIAGNOSIS — Z6841 Body Mass Index (BMI) 40.0 and over, adult: Secondary | ICD-10-CM

## 2024-09-09 DIAGNOSIS — Z0271 Encounter for disability determination: Secondary | ICD-10-CM

## 2024-09-09 DIAGNOSIS — E1169 Type 2 diabetes mellitus with other specified complication: Secondary | ICD-10-CM

## 2024-09-09 DIAGNOSIS — Z7984 Long term (current) use of oral hypoglycemic drugs: Secondary | ICD-10-CM | POA: Diagnosis not present

## 2024-09-09 DIAGNOSIS — I5032 Chronic diastolic (congestive) heart failure: Secondary | ICD-10-CM | POA: Diagnosis not present

## 2024-09-09 NOTE — Progress Notes (Signed)
 ==============================  [facility]: [phone]   -- Medical Office Visit --  Patient: Jason Meyer      Age: 63 y.o.       Sex:  male  Date:   09/09/2024 Today's Healthcare Provider: Bernardino KANDICE Cone, MD  ==============================   Chief Complaint: Discuss Disability  (Pt has some disability paper work filled out )  Discussed the use of AI scribe software for clinical note transcription with the patient, who gave verbal consent to proceed. History of Present Illness 63 year old male with heart failure and arthritis who presents for completion of disability paperwork.  He experiences severe symptoms related to heart failure, including shortness of breath and chronically swollen ankles.  He has a history of severe arthritis, which significantly contributes to his disability.  He engages in physical therapy three days a week, including weight lifting, using leg machines, and cycling to improve his condition. He is on Mounjaro  at a dose of 15 mg and reports weight loss as a result.  He mentions a recent issue with his metformin  prescription, stating he will be without it for a week or two due to a pharmacy error.  He stopped working completely in July 2024 and has been following up regularly since October 2024. He is currently on multiple medications, which he finds effective, including a new medication he describes as a 'strong little half pill.'  Background Reviewed: Problem List: has Type 2 diabetes mellitus with other specified complication (HCC); Hypertension; Morbid obesity with BMI of 50.0-59.9, adult (HCC); Mixed hyperlipidemia; Venous stasis; OA (osteoarthritis); DOE (dyspnea on exertion); Chronic diastolic heart failure (HCC); Venous stasis dermatitis of both lower extremities; OSA (obstructive sleep apnea); Erectile dysfunction; Low HDL (under 40); Elevated PSA; Coronary artery calcification; Coronary artery disease; On  statin therapy; Chronic kidney disease, stage 3 unspecified (HCC); Vitamin D  deficiency; Vision problems; Encounter for disability examination; Thalassemia carrier; Diabetic nephropathy with proteinuria (HCC); and Material hardship due to limited financial resources on their problem list. Past Medical History:  has a past medical history of Allergy, Arthritis, CAP (community acquired pneumonia) (04/12/2023), Chronic diastolic heart failure (HCC) (90/98/7976), Coronary artery calcification (10/19/2022), Hyperlipidemia, Hypertension, OSA (obstructive sleep apnea) (10/19/2022), Sepsis (HCC) (04/12/2023), Sleep apnea, and Type 2 diabetes mellitus (HCC). Past Surgical History:   has a past surgical history that includes No past surgeries. Social History:   reports that he quit smoking about 16 years ago. His smoking use included cigarettes. He started smoking about 49 years ago. He has a 33 pack-year smoking history. He has never used smokeless tobacco. He reports that he does not currently use alcohol. He reports that he does not use drugs. Family History:  family history includes Alzheimer's disease in his mother; Heart attack in his brother and father; Heart disease in his father; Hypertension in his father; Stroke in his father. Allergies:  is allergic to penicillins.   Medication Reconciliation: Current Outpatient Medications on File Prior to Visit  Medication Sig   acetaminophen  (TYLENOL ) 325 MG tablet Take 2 tablets (650 mg total) by mouth every 6 (six) hours as needed for mild pain (or Fever >/= 101).   albuterol  (PROVENTIL ) (2.5 MG/3ML) 0.083% nebulizer solution Take 3 mLs (2.5 mg total) by nebulization every 6 (six) hours as needed for wheezing or shortness of breath.   albuterol  (VENTOLIN  HFA) 108 (90 Base) MCG/ACT inhaler Inhale 2 puffs into the lungs every 6 (six) hours as needed for wheezing or shortness of  breath.   amLODipine -olmesartan  (AZOR ) 10-40 MG tablet Take 1 tablet by mouth daily.  Replaces amlodipine  and olmesartan    ASPIRIN  EC 81 MG EC tablet Take 1 tablet (81 mg total) by mouth daily.   Azelastine  HCl 137 MCG/SPRAY SOLN Place 1 spray into the nose daily.   bisoprolol  (ZEBETA ) 10 MG tablet Take 1 tablet (10 mg total) by mouth daily.   buPROPion  (WELLBUTRIN  XL) 150 MG 24 hr tablet TAKE 1 TABLET BY MOUTH EVERY DAY   celecoxib  (CELEBREX ) 200 MG capsule TAKE 1 CAPSULE (200 MG TOTAL) BY MOUTH 2 (TWO) TIMES DAILY. REPLACES MELOXICAM    Cholecalciferol (VITAMIN D3) 125 MCG (5000 UT) CAPS Take 1 capsule (5,000 Units total) by mouth daily.   clotrimazole  (LOTRIMIN ) 1 % cream Apply topically daily.   clotrimazole -betamethasone  (LOTRISONE ) cream APPLY TO AFFECTED AREA TWICE A DAY   empagliflozin  (JARDIANCE ) 25 MG TABS tablet Take 1 tablet (25 mg total) by mouth daily before breakfast.   Evolocumab  (REPATHA  SURECLICK) 140 MG/ML SOAJ Inject 140 mg into the skin every 14 (fourteen) days.   ezetimibe  (ZETIA ) 10 MG tablet Take 1 tablet (10 mg total) by mouth daily.   famotidine -calcium  carbonate-magnesium hydroxide (PEPCID  COMPLETE) 10-800-165 MG chewable tablet Chew 1 tablet by mouth 2 (two) times daily as needed.   Finerenone  (KERENDIA ) 20 MG TABS Take 1 tablet (20 mg total) by mouth daily. Replaces spironolactone  if approved, start at 10 mg for first 4 weeks (half tablets)   fluticasone  (FLONASE ) 50 MCG/ACT nasal spray Place 2 sprays into both nostrils daily.   fluticasone  furoate-vilanterol (BREO ELLIPTA ) 100-25 MCG/ACT AEPB Inhale 1 puff into the lungs daily.   furosemide  (LASIX ) 40 MG tablet Take 2 tablets (80 mg total) by mouth daily. Must take with spironolactone  to keep potassium up Reduce dose to just 40 mg daily if no fluid is accumulating.   hydrochlorothiazide  (HYDRODIURIL ) 25 MG tablet Take 1 tablet (25 mg total) by mouth daily. Only Half tablet daily am until kidneys checked   metFORMIN  (GLUCOPHAGE ) 1000 MG tablet Take 1 tablet (1,000 mg total) by mouth 2 (two) times daily  with a meal.   potassium chloride  (MICRO-K ) 10 MEQ CR capsule TAKE 1 CAPSULE BY MOUTH EVERY DAY   rosuvastatin  (CRESTOR ) 40 MG tablet Take 1 tablet (40 mg total) by mouth daily. Replaces atorvastatin  which failed to reach Low Density Lipoprotein (LDL cholesterol) goal of 55, take only if repatha  not approved/available.   spironolactone  (ALDACTONE ) 50 MG tablet Take 1 tablet (50 mg total) by mouth daily. Replaces 25 mg dose   tirzepatide  (MOUNJARO ) 15 MG/0.5ML Pen Inject 15 mg into the skin once a week. Replaces 12.5 mg dose   No current facility-administered medications on file prior to visit.  There are no discontinued medications.   Physical Exam:    09/09/2024    9:57 AM 08/19/2024   10:35 AM 07/17/2024    8:43 AM  Vitals with BMI  Height 5' 11 5' 11 5' 11  Weight 359 lbs 3 oz 358 lbs 13 oz 361 lbs 13 oz  BMI 50.12 50.06 50.48  Systolic 126 136 859  Diastolic 72 78 78  Pulse 60 85 78  Vital signs reviewed.  Nursing notes reviewed. Weight trend reviewed. Physical Activity: Not on file   General Appearance:  No acute distress appreciable.   Well-groomed, healthy-appearing male.  Well proportioned with no abnormal fat distribution.  Good muscle tone. Pulmonary:  Normal work of breathing at rest, no respiratory distress apparent. SpO2: 98 %  Musculoskeletal: All extremities are intact.  Neurological:  Awake, alert, oriented, and engaged.  No obvious focal neurological deficits or cognitive impairments.  Sensorium seems unclouded.   Speech is clear and coherent with logical content. Psychiatric:  Appropriate mood, pleasant and cooperative demeanor, thoughtful and engaged during the exam   Verbalized to patient: Physical Exam EXTREMITIES: Chronically severely swollen ankles. Morbid obesity, difficulty standing and walking but able to do so.   Results:   Verbalized to patient: Results      07/28/2024    9:42 AM 05/15/2024   10:34 AM 05/09/2024   10:37 AM 01/14/2024    2:16 PM   PHQ 2/9 Scores  PHQ - 2 Score 0 0 0 0  PHQ- 9 Score  0 0      No results found for any visits on 09/09/24.} Office Visit on 07/17/2024  Component Date Value Ref Range Status   WBC 07/17/2024 8.7  4.0 - 10.5 K/uL Final   RBC 07/17/2024 6.24 (H)  4.22 - 5.81 Mil/uL Final   Hemoglobin 07/17/2024 15.9  13.0 - 17.0 g/dL Final   HCT 91/92/7974 49.7  39.0 - 52.0 % Final   MCV 07/17/2024 79.6  78.0 - 100.0 fl Final   MCHC 07/17/2024 32.1  30.0 - 36.0 g/dL Final   RDW 91/92/7974 17.9 (H)  11.5 - 15.5 % Final   Platelets 07/17/2024 245.0  150.0 - 400.0 K/uL Final   Neutrophils Relative % 07/17/2024 72.6  43.0 - 77.0 % Final   Lymphocytes Relative 07/17/2024 18.5  12.0 - 46.0 % Final   Monocytes Relative 07/17/2024 7.1  3.0 - 12.0 % Final   Eosinophils Relative 07/17/2024 1.3  0.0 - 5.0 % Final   Basophils Relative 07/17/2024 0.5  0.0 - 3.0 % Final   Neutro Abs 07/17/2024 6.3  1.4 - 7.7 K/uL Final   Lymphs Abs 07/17/2024 1.6  0.7 - 4.0 K/uL Final   Monocytes Absolute 07/17/2024 0.6  0.1 - 1.0 K/uL Final   Eosinophils Absolute 07/17/2024 0.1  0.0 - 0.7 K/uL Final   Basophils Absolute 07/17/2024 0.0  0.0 - 0.1 K/uL Final   Sodium 07/17/2024 139  135 - 145 mEq/L Final   Potassium 07/17/2024 4.8  3.5 - 5.1 mEq/L Final   Chloride 07/17/2024 102  96 - 112 mEq/L Final   CO2 07/17/2024 25  19 - 32 mEq/L Final   Glucose, Bld 07/17/2024 85  70 - 99 mg/dL Final   BUN 91/92/7974 16  6 - 23 mg/dL Final   Creatinine, Ser 07/17/2024 1.39  0.40 - 1.50 mg/dL Final   Total Bilirubin 07/17/2024 0.5  0.2 - 1.2 mg/dL Final   Alkaline Phosphatase 07/17/2024 67  39 - 117 U/L Final   AST 07/17/2024 17  0 - 37 U/L Final   ALT 07/17/2024 19  0 - 53 U/L Final   Total Protein 07/17/2024 7.4  6.0 - 8.3 g/dL Final   Albumin 91/92/7974 4.2  3.5 - 5.2 g/dL Final   GFR 91/92/7974 54.66 (L)  >60.00 mL/min Final   Calcium  07/17/2024 10.0  8.4 - 10.5 mg/dL Final   Cholesterol 91/92/7974 136  0 - 200 mg/dL Final    Triglycerides 07/17/2024 86.0  0.0 - 149.0 mg/dL Final   HDL 91/92/7974 42.80  >39.00 mg/dL Final   VLDL 91/92/7974 17.2  0.0 - 40.0 mg/dL Final   LDL Cholesterol 07/17/2024 76  0 - 99 mg/dL Final   Total CHOL/HDL Ratio 07/17/2024 3   Final  NonHDL 07/17/2024 93.49   Final   Hgb A1c MFr Bld 07/17/2024 6.7 (H)  4.6 - 6.5 % Final   Microalb, Ur 07/17/2024 3.8 (H)  0.0 - 1.9 mg/dL Final   Creatinine,U 91/92/7974 176.2  mg/dL Final   Microalb Creat Ratio 07/17/2024 21.7  0.0 - 30.0 mg/g Final   VITD 07/17/2024 59.82  30.00 - 100.00 ng/mL Final  Office Visit on 04/14/2024  Component Date Value Ref Range Status   Sodium 04/14/2024 139  135 - 145 mEq/L Final   Potassium 04/14/2024 4.2  3.5 - 5.1 mEq/L Final   Chloride 04/14/2024 105  96 - 112 mEq/L Final   CO2 04/14/2024 25  19 - 32 mEq/L Final   Glucose, Bld 04/14/2024 110 (H)  70 - 99 mg/dL Final   BUN 94/94/7974 13  6 - 23 mg/dL Final   Creatinine, Ser 04/14/2024 1.38  0.40 - 1.50 mg/dL Final   Total Bilirubin 04/14/2024 0.6  0.2 - 1.2 mg/dL Final   Alkaline Phosphatase 04/14/2024 63  39 - 117 U/L Final   AST 04/14/2024 15  0 - 37 U/L Final   ALT 04/14/2024 15  0 - 53 U/L Final   Total Protein 04/14/2024 6.5  6.0 - 8.3 g/dL Final   Albumin 94/94/7974 4.0  3.5 - 5.2 g/dL Final   GFR 94/94/7974 55.24 (L)  >60.00 mL/min Final   Calcium  04/14/2024 9.0  8.4 - 10.5 mg/dL Final   VITD 94/94/7974 23.85 (L)  30.00 - 100.00 ng/mL Final   Creatinine, Urine 04/14/2024 156  20 - 320 mg/dL Final   Protein/Creat Ratio 04/14/2024 545 (H)  25 - 148 mg/g creat Final   Protein/Creatinine Ratio 04/14/2024 0.545 (H)  0.025 - 0.148 mg/mg creat Final   Total Protein, Urine 04/14/2024 85 (H)  5 - 25 mg/dL Final   PSA 94/94/7974 11.46 (H)  0.10 - 4.00 ng/mL Final   Hgb A1c MFr Bld 04/14/2024 6.5  4.6 - 6.5 % Final   WBC 04/14/2024 8.5  4.0 - 10.5 K/uL Final   RBC 04/14/2024 6.00 (H)  4.22 - 5.81 Mil/uL Final   Hemoglobin 04/14/2024 15.6  13.0 - 17.0 g/dL  Final   HCT 94/94/7974 47.7  39.0 - 52.0 % Final   MCV 04/14/2024 79.5  78.0 - 100.0 fl Final   MCHC 04/14/2024 32.8  30.0 - 36.0 g/dL Final   RDW 94/94/7974 16.3 (H)  11.5 - 15.5 % Final   Platelets 04/14/2024 175.0  150.0 - 400.0 K/uL Final   Neutrophils Relative % 04/14/2024 72.9  43.0 - 77.0 % Final   Lymphocytes Relative 04/14/2024 17.2  12.0 - 46.0 % Final   Monocytes Relative 04/14/2024 6.2  3.0 - 12.0 % Final   Eosinophils Relative 04/14/2024 3.1  0.0 - 5.0 % Final   Basophils Relative 04/14/2024 0.6  0.0 - 3.0 % Final   Neutro Abs 04/14/2024 6.2  1.4 - 7.7 K/uL Final   Lymphs Abs 04/14/2024 1.5  0.7 - 4.0 K/uL Final   Monocytes Absolute 04/14/2024 0.5  0.1 - 1.0 K/uL Final   Eosinophils Absolute 04/14/2024 0.3  0.0 - 0.7 K/uL Final   Basophils Absolute 04/14/2024 0.1  0.0 - 0.1 K/uL Final  Office Visit on 01/14/2024  Component Date Value Ref Range Status   WBC 01/14/2024 9.1  4.0 - 10.5 K/uL Final   RBC 01/14/2024 5.99 (H)  4.22 - 5.81 Mil/uL Final   Hemoglobin 01/14/2024 15.4  13.0 - 17.0 g/dL Final  HCT 01/14/2024 47.8  39.0 - 52.0 % Final   MCV 01/14/2024 79.7  78.0 - 100.0 fl Final   MCHC 01/14/2024 32.2  30.0 - 36.0 g/dL Final   RDW 97/96/7974 17.8 (H)  11.5 - 15.5 % Final   Platelets 01/14/2024 220.0  150.0 - 400.0 K/uL Final   Neutrophils Relative % 01/14/2024 74.3  43.0 - 77.0 % Final   Lymphocytes Relative 01/14/2024 18.1  12.0 - 46.0 % Final   Monocytes Relative 01/14/2024 4.8  3.0 - 12.0 % Final   Eosinophils Relative 01/14/2024 1.9  0.0 - 5.0 % Final   Basophils Relative 01/14/2024 0.9  0.0 - 3.0 % Final   Neutro Abs 01/14/2024 6.7  1.4 - 7.7 K/uL Final   Lymphs Abs 01/14/2024 1.6  0.7 - 4.0 K/uL Final   Monocytes Absolute 01/14/2024 0.4  0.1 - 1.0 K/uL Final   Eosinophils Absolute 01/14/2024 0.2  0.0 - 0.7 K/uL Final   Basophils Absolute 01/14/2024 0.1  0.0 - 0.1 K/uL Final   Sodium 01/14/2024 140  135 - 145 mEq/L Final   Potassium 01/14/2024 4.3  3.5 - 5.1  mEq/L Final   Chloride 01/14/2024 103  96 - 112 mEq/L Final   CO2 01/14/2024 28  19 - 32 mEq/L Final   Glucose, Bld 01/14/2024 89  70 - 99 mg/dL Final   BUN 97/96/7974 14  6 - 23 mg/dL Final   Creatinine, Ser 01/14/2024 1.33  0.40 - 1.50 mg/dL Final   Total Bilirubin 01/14/2024 0.5  0.2 - 1.2 mg/dL Final   Alkaline Phosphatase 01/14/2024 67  39 - 117 U/L Final   AST 01/14/2024 16  0 - 37 U/L Final   ALT 01/14/2024 15  0 - 53 U/L Final   Total Protein 01/14/2024 7.1  6.0 - 8.3 g/dL Final   Albumin 97/96/7974 4.1  3.5 - 5.2 g/dL Final   GFR 97/96/7974 57.84 (L)  >60.00 mL/min Final   Calcium  01/14/2024 9.3  8.4 - 10.5 mg/dL Final   Cholesterol 97/96/7974 125  0 - 200 mg/dL Final   Triglycerides 97/96/7974 93.0  0.0 - 149.0 mg/dL Final   HDL 97/96/7974 40.10  >39.00 mg/dL Final   VLDL 97/96/7974 18.6  0.0 - 40.0 mg/dL Final   LDL Cholesterol 01/14/2024 66  0 - 99 mg/dL Final   Total CHOL/HDL Ratio 01/14/2024 3   Final   NonHDL 01/14/2024 84.77   Final   Hgb A1c MFr Bld 01/14/2024 6.3  4.6 - 6.5 % Final   Interpretation - HGBRFX 01/14/2024 see note   Final   Reviewed By: 01/14/2024 see note   Final   Hgb A 01/14/2024 97.5  >96.0 % Final   Fetal Hemoglobin Testing 01/14/2024 0.0  <2.0 % Final   Hemoglobin A2 - HGBRFX 01/14/2024 2.5  2.0 - 3.2 % Final   C-Z Electrophoresis 01/14/2024 Confirms   Final   RBC 01/14/2024 6.15 (H)  4.20 - 5.80 Mill/uL Final   Hemoglobin 01/14/2024 15.5  13.2 - 17.1 g/dL Final   HCT 97/96/7974 50.2 (H)  38.5 - 50.0 % Final   MCV 01/14/2024 81.6  80.0 - 100.0 fL Final   MCH 01/14/2024 25.2 (L)  27.0 - 33.0 pg Final   MCHC 01/14/2024 30.9 (L)  32.0 - 36.0 g/dL Final   RDW 97/96/7974 17.3 (H)  11.0 - 15.0 % Final   Ferritin 01/14/2024 55  24 - 380 ng/mL Final   VITD 01/14/2024 32.30  30.00 - 100.00 ng/mL Final  PTH 01/14/2024 35  16 - 77 pg/mL Final   PSA, Total 01/14/2024 11.7 (H)  < OR = 4.0 ng/mL Final   PSA, Free 01/14/2024 1.0  ng/mL Final   PSA, %  Free 01/14/2024 NOT CALCULATED  >25 % (calc) Final   Magnesium 01/14/2024 1.7  1.5 - 2.5 mg/dL Final   Uric Acid, Serum 01/14/2024 5.4  4.0 - 7.8 mg/dL Final   Ferritin 97/96/7974 44.0  22.0 - 322.0 ng/mL Final   Vitamin B-12 01/14/2024 212  211 - 911 pg/mL Final   Summary Report 01/14/2024 see note   Final   Reviewed By: 01/14/2024 see note   Final   Results Recieved 01/14/2024 01/25/24   Final   Alpha-Thalassemia 01/14/2024 see note   Final  Office Visit on 10/05/2023  Component Date Value Ref Range Status   Vit D, 25-Hydroxy 10/05/2023 10 (L)  30 - 100 ng/mL Final   Uric Acid, Serum 10/05/2023 5.4  4.0 - 8.0 mg/dL Final   PTH 89/74/7975 13 (L)  16 - 77 pg/mL Final   Phosphorus 10/05/2023 3.2  2.5 - 4.5 mg/dL Final   Magnesium 89/74/7975 1.9  1.5 - 2.5 mg/dL Final   Glucose, Bld 89/74/7975 85  65 - 99 mg/dL Final   BUN 89/74/7975 14  7 - 25 mg/dL Final   Creat 89/74/7975 1.22  0.70 - 1.35 mg/dL Final   BUN/Creatinine Ratio 10/05/2023 SEE NOTE:  6 - 22 (calc) Final   Sodium 10/05/2023 141  135 - 146 mmol/L Final   Potassium 10/05/2023 4.6  3.5 - 5.3 mmol/L Final   Chloride 10/05/2023 102  98 - 110 mmol/L Final   CO2 10/05/2023 28  20 - 32 mmol/L Final   Calcium  10/05/2023 9.8  8.6 - 10.3 mg/dL Final  Office Visit on 09/07/2023  Component Date Value Ref Range Status   WBC 09/07/2023 9.5  3.8 - 10.8 Thousand/uL Final   RBC 09/07/2023 6.28 (H)  4.20 - 5.80 Million/uL Final   Hemoglobin 09/07/2023 15.6  13.2 - 17.1 g/dL Final   HCT 90/72/7975 49.5  38.5 - 50.0 % Final   MCV 09/07/2023 78.8 (L)  80.0 - 100.0 fL Final   MCH 09/07/2023 24.8 (L)  27.0 - 33.0 pg Final   MCHC 09/07/2023 31.5 (L)  32.0 - 36.0 g/dL Final   RDW 90/72/7975 14.8  11.0 - 15.0 % Final   Platelets 09/07/2023 270  140 - 400 Thousand/uL Final   MPV 09/07/2023 10.8  7.5 - 12.5 fL Final   Neutro Abs 09/07/2023 6,603  1,500 - 7,800 cells/uL Final   Lymphs Abs 09/07/2023 1,957  850 - 3,900 cells/uL Final   Absolute  Monocytes 09/07/2023 751  200 - 950 cells/uL Final   Eosinophils Absolute 09/07/2023 133  15 - 500 cells/uL Final   Basophils Absolute 09/07/2023 57  0 - 200 cells/uL Final   Neutrophils Relative % 09/07/2023 69.5  % Final   Total Lymphocyte 09/07/2023 20.6  % Final   Monocytes Relative 09/07/2023 7.9  % Final   Eosinophils Relative 09/07/2023 1.4  % Final   Basophils Relative 09/07/2023 0.6  % Final   Glucose, Bld 09/07/2023 78  65 - 99 mg/dL Final   BUN 90/72/7975 14  7 - 25 mg/dL Final   Creat 90/72/7975 1.24  0.70 - 1.35 mg/dL Final   BUN/Creatinine Ratio 09/07/2023 SEE NOTE:  6 - 22 (calc) Final   Sodium 09/07/2023 140  135 - 146 mmol/L Final   Potassium  09/07/2023 4.0  3.5 - 5.3 mmol/L Final   Chloride 09/07/2023 106  98 - 110 mmol/L Final   CO2 09/07/2023 22  20 - 32 mmol/L Final   Calcium  09/07/2023 9.2  8.6 - 10.3 mg/dL Final   Total Protein 90/72/7975 6.7  6.1 - 8.1 g/dL Final   Albumin 90/72/7975 4.1  3.6 - 5.1 g/dL Final   Globulin 90/72/7975 2.6  1.9 - 3.7 g/dL (calc) Final   AG Ratio 09/07/2023 1.6  1.0 - 2.5 (calc) Final   Total Bilirubin 09/07/2023 0.4  0.2 - 1.2 mg/dL Final   Alkaline phosphatase (APISO) 09/07/2023 75  35 - 144 U/L Final   AST 09/07/2023 21  10 - 35 U/L Final   ALT 09/07/2023 24  9 - 46 U/L Final   Cholesterol 09/07/2023 113  <200 mg/dL Final   HDL 90/72/7975 37 (L)  > OR = 40 mg/dL Final   Triglycerides 90/72/7975 73  <150 mg/dL Final   LDL Cholesterol (Calc) 09/07/2023 61  mg/dL (calc) Final   Total CHOL/HDL Ratio 09/07/2023 3.1  <4.9 (calc) Final   Non-HDL Cholesterol (Calc) 09/07/2023 76  <130 mg/dL (calc) Final   Hgb J8r MFr Bld 09/07/2023 6.7 (H)  <5.7 % of total Hgb Final   Mean Plasma Glucose 09/07/2023 146  mg/dL Final   eAG (mmol/L) 90/72/7975 8.1  mmol/L Final   Creatinine, Urine 09/07/2023 218  20 - 320 mg/dL Final   Microalb, Ur 90/72/7975 8.5  mg/dL Final   Microalb Creat Ratio 09/07/2023 39 (H)  <30 mg/g creat Final  Office Visit  on 05/08/2023  Component Date Value Ref Range Status   PSA 05/08/2023 11.53 (H)  0.10 - 4.00 ng/mL Final   Sodium 05/08/2023 139  135 - 145 mEq/L Final   Potassium 05/08/2023 3.9  3.5 - 5.1 mEq/L Final   Chloride 05/08/2023 105  96 - 112 mEq/L Final   CO2 05/08/2023 24  19 - 32 mEq/L Final   Glucose, Bld 05/08/2023 140 (H)  70 - 99 mg/dL Final   BUN 94/71/7975 13  6 - 23 mg/dL Final   Creatinine, Ser 05/08/2023 1.12  0.40 - 1.50 mg/dL Final   GFR 94/71/7975 71.43  >60.00 mL/min Final   Calcium  05/08/2023 9.0  8.4 - 10.5 mg/dL Final   WBC 94/71/7975 7.9  4.0 - 10.5 K/uL Final   RBC 05/08/2023 5.90 (H)  4.22 - 5.81 Mil/uL Final   Hemoglobin 05/08/2023 14.5  13.0 - 17.0 g/dL Final   HCT 94/71/7975 45.5  39.0 - 52.0 % Final   MCV 05/08/2023 77.1 (L)  78.0 - 100.0 fl Final   MCHC 05/08/2023 31.8  30.0 - 36.0 g/dL Final   RDW 94/71/7975 18.3 (H)  11.5 - 15.5 % Final   Platelets 05/08/2023 196.0  150.0 - 400.0 K/uL Final   Neutrophils Relative % 05/08/2023 73.8  43.0 - 77.0 % Final   Lymphocytes Relative 05/08/2023 18.4  12.0 - 46.0 % Final   Monocytes Relative 05/08/2023 5.3  3.0 - 12.0 % Final   Eosinophils Relative 05/08/2023 2.0  0.0 - 5.0 % Final   Basophils Relative 05/08/2023 0.5  0.0 - 3.0 % Final   Neutro Abs 05/08/2023 5.8  1.4 - 7.7 K/uL Final   Lymphs Abs 05/08/2023 1.4  0.7 - 4.0 K/uL Final   Monocytes Absolute 05/08/2023 0.4  0.1 - 1.0 K/uL Final   Eosinophils Absolute 05/08/2023 0.2  0.0 - 0.7 K/uL Final   Basophils Absolute 05/08/2023 0.0  0.0 - 0.1 K/uL Final   Ferritin 05/08/2023 38.7  22.0 - 322.0 ng/mL Final  No results displayed because visit has over 200 results.    Office Visit on 12/26/2022  Component Date Value Ref Range Status   WBC 12/26/2022 10.1  4.0 - 10.5 K/uL Final   RBC 12/26/2022 5.71  4.22 - 5.81 Mil/uL Final   Platelets 12/26/2022 331.0  150.0 - 400.0 K/uL Final   Hemoglobin 12/26/2022 14.3  13.0 - 17.0 g/dL Final   HCT 98/83/7975 44.1  39.0 - 52.0  % Final   MCV 12/26/2022 77.2 (L)  78.0 - 100.0 fl Final   MCHC 12/26/2022 32.5  30.0 - 36.0 g/dL Final   RDW 98/83/7975 17.2 (H)  11.5 - 15.5 % Final   Sodium 12/26/2022 141  135 - 145 mEq/L Final   Potassium 12/26/2022 4.3  3.5 - 5.1 mEq/L Final   Chloride 12/26/2022 103  96 - 112 mEq/L Final   CO2 12/26/2022 29  19 - 32 mEq/L Final   Glucose, Bld 12/26/2022 103 (H)  70 - 99 mg/dL Final   BUN 98/83/7975 11  6 - 23 mg/dL Final   Creatinine, Ser 12/26/2022 1.17  0.40 - 1.50 mg/dL Final   Total Bilirubin 12/26/2022 0.5  0.2 - 1.2 mg/dL Final   Alkaline Phosphatase 12/26/2022 95  39 - 117 U/L Final   AST 12/26/2022 13  0 - 37 U/L Final   ALT 12/26/2022 12  0 - 53 U/L Final   Total Protein 12/26/2022 7.2  6.0 - 8.3 g/dL Final   Albumin 98/83/7975 4.0  3.5 - 5.2 g/dL Final   GFR 98/83/7975 67.96  >60.00 mL/min Final   Calcium  12/26/2022 9.5  8.4 - 10.5 mg/dL Final   Cholesterol 98/83/7975 139  0 - 200 mg/dL Final   Triglycerides 98/83/7975 74.0  0.0 - 149.0 mg/dL Final   HDL 98/83/7975 30.50 (L)  >60.99 mg/dL Final   VLDL 98/83/7975 14.8  0.0 - 40.0 mg/dL Final   LDL Cholesterol 12/26/2022 94  0 - 99 mg/dL Final   Total CHOL/HDL Ratio 12/26/2022 5   Final   NonHDL 12/26/2022 108.76   Final   MICRO NUMBER: 12/26/2022 [id number]   Final   SPECIMEN QUALITY: 12/26/2022 Adequate   Final   Sample Source 12/26/2022 URINE   Final   STATUS: 12/26/2022 FINAL   Final   ISOLATE 1: 12/26/2022 Klebsiella pneumoniae (A)   Final   Color, UA 12/26/2022 yellow   Final   Clarity, UA 12/26/2022 cloudy   Final   Glucose, UA 12/26/2022 Negative  Negative Final   Bilirubin, UA 12/26/2022 Negative   Final   Ketones, UA 12/26/2022 Negative   Final   Spec Grav, UA 12/26/2022 1.015  1.010 - 1.025 Final   Blood, UA 12/26/2022 Negative   Final   pH, UA 12/26/2022 5.0  5.0 - 8.0 Final   Protein, UA 12/26/2022 Negative  Negative Final   Urobilinogen, UA 12/26/2022 0.2  0.2 or 1.0 E.U./dL Final   Nitrite, UA  98/83/7975 Postive   Final   Leukocytes, UA 12/26/2022 Moderate (2+) (A)  Negative Final  No image results found. No results found.       ASSESSMENT & PLAN   Assessment & Plan Encounter for disability examination I personally spent a total of 22 minutes in the care of the patient today including performing a medically appropriate exam/evaluation, documenting clinical information in the EHR, and completing paperwork for long term disability insurance company (  scanned and to be faxed).  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 The condition remains unchanged since receiving disability. Improvement is possible with weight loss and strict medication adherence. Emphasized the importance of exercise and medication adherence for improving survival rates, which is anticipated to be over 90% with Mounjaro  and exercise. Continue current medications, including Tirzepatide  (Mounjaro ) 15 MG/0.5ML subcutaneous once a week. Encourage continued exercise regimen, including weight lifting and cycling. Monitor fluid status and adjust medications as needed. Schedule regular follow-up visits to adjust medications and monitor condition. Primary osteoarthritis of both knees  Severe osteoarthritis of knees   He has severe osteoarthritis of the knees, exacerbated by heart failure. Surgical intervention is not possible due to heart failure. Discussed potential for future surgical intervention if heart condition improves and weight is reduced. Encourage weight loss to potentially qualify for future knee replacement surgery. Morbid obesity with BMI of 50.0-59.9, adult (HCC) Morbid obesity (BMI 50.0-59.9)   He is morbidly obese with a BMI of 50.0-59.9. Weight loss is critical for improving heart failure and potentially qualifying for knee  surgery. Current weight loss efforts include exercise and medication. Continue current weight loss efforts with exercise and medication adherence. 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 Current medication regimen includes Metformin , but there is a temporary lapse in medication due to pharmacy error. Discussed the importance of maintaining medication regimen. Advise purchasing Metformin  out of pocket to avoid lapse in medication.   Recording duration: 20 minutes  ORDER ASSOCIATIONS  #   DIAGNOSIS / CONDITION ICD-10 ENCOUNTER ORDER     ICD-10-CM   1. Encounter for disability examination  Z02.71     2. Chronic diastolic heart failure (HCC)  P49.67     3. Primary osteoarthritis of both knees  M17.0     4. Morbid obesity with BMI of 50.0-59.9, adult (HCC)  E66.01    Z68.43     5. Type 2 diabetes mellitus with other specified complication, without long-term current use of insulin  (HCC)  E11.69            Orders Placed in Encounter:   Lab Orders  No laboratory test(s) ordered today   Imaging Orders  No imaging studies ordered today   Referral Orders  No referral(s) requested today   No orders of the defined types were placed in this encounter.   No orders of the defined types were placed in this encounter.  ED Discharge Orders     None         This document was synthesized by artificial intelligence ([employer]) using HIPAA-compliant recording of the clinical interaction;   We discussed the use of AI scribe software for clinical note transcription with the patient, who gave verbal consent to proceed. additional Info: This encounter employed state-of-the-art, real-time, collaborative documentation. The patient actively reviewed and assisted in updating their electronic medical record on a shared  screen, ensuring transparency and facilitating joint problem-solving for the problem list, overview, and plan. This approach promotes accurate, informed care. The treatment plan was discussed and reviewed in detail, including medication safety, potential side effects, and all patient questions. We confirmed understanding and comfort with the plan. Follow-up instructions were established, including contacting  the office for any concerns, returning if symptoms worsen, persist, or new symptoms develop, and precautions for potential emergency department visits.

## 2024-09-09 NOTE — Assessment & Plan Note (Signed)
 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 The condition remains unchanged since receiving disability. Improvement is possible with weight loss and strict medication adherence. Emphasized the importance of exercise and medication adherence for improving survival rates, which is anticipated to be over 90% with Mounjaro  and exercise. Continue current medications, including Tirzepatide  (Mounjaro ) 15 MG/0.5ML subcutaneous once a week. Encourage continued exercise regimen, including weight lifting and cycling. Monitor fluid status and adjust medications as needed. Schedule regular follow-up visits to adjust medications and monitor condition.

## 2024-09-09 NOTE — Assessment & Plan Note (Signed)
 Morbid obesity (BMI 50.0-59.9)   He is morbidly obese with a BMI of 50.0-59.9. Weight loss is critical for improving heart failure and potentially qualifying for knee surgery. Current weight loss efforts include exercise and medication. Continue current weight loss efforts with exercise and medication adherence.

## 2024-09-09 NOTE — Assessment & Plan Note (Signed)
  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 Current medication regimen includes Metformin , but there is a temporary lapse in medication due to pharmacy error. Discussed the importance of maintaining medication regimen. Advise purchasing Metformin  out of pocket to avoid lapse in medication.

## 2024-09-09 NOTE — Assessment & Plan Note (Signed)
  Severe osteoarthritis of knees   He has severe osteoarthritis of the knees, exacerbated by heart failure. Surgical intervention is not possible due to heart failure. Discussed potential for future surgical intervention if heart condition improves and weight is reduced. Encourage weight loss to potentially qualify for future knee replacement surgery.

## 2024-09-09 NOTE — Assessment & Plan Note (Signed)
 I personally spent a total of 22 minutes in the care of the patient today including performing a medically appropriate exam/evaluation, documenting clinical information in the EHR, and completing paperwork for long term disability insurance company (scanned and to be faxed).

## 2024-09-24 ENCOUNTER — Ambulatory Visit: Admitting: Surgical

## 2024-09-24 ENCOUNTER — Encounter: Payer: Self-pay | Admitting: Surgical

## 2024-09-24 ENCOUNTER — Ambulatory Visit (INDEPENDENT_AMBULATORY_CARE_PROVIDER_SITE_OTHER): Admitting: Surgical

## 2024-09-24 DIAGNOSIS — M170 Bilateral primary osteoarthritis of knee: Secondary | ICD-10-CM

## 2024-09-24 DIAGNOSIS — G8929 Other chronic pain: Secondary | ICD-10-CM

## 2024-09-24 NOTE — Progress Notes (Signed)
 Office Visit Note   Patient: Jason Meyer           Date of Birth: October 13, 1961           MRN: [id number] Visit Date: 09/24/2024 Requested by: Jesus Bernardino MATSU, MD [address] [place],  [place] [address] PCP: Jesus Bernardino MATSU, MD  Subjective: Chief Complaint  Patient presents with   Knee Pain    Bil knee cortisone injections today. Was sent to PT next door.  No PT at [facility].    HPI: Jason Meyer is a 63 y.o. male who presents to the office reporting knee pain.  Has history of knee arthritis.  No new falls or injuries.  No fevers or chills.  Previous injections have provided good relief and they are here today to repeat injections.  Last injections lasted for about 3 months with good relief.  He has been doing land-based physical therapy and he feels this is going well for him.  No new changes..                ROS: All systems reviewed are negative as they relate to the chief complaint within the history of present illness.  Patient denies fevers or chills.  Assessment & Plan: Visit Diagnoses:  1. Bilateral primary osteoarthritis of knee   2. Bilateral chronic knee pain     Plan: Plan is bilateral cortisone injection today.  Patient tolerated procedures well without complication.  We will see him back with next possible injection in 3 to 4 months.  Glad to see that he is doing well, keeping up with home exercise program at his local [place] [facility] 3 times a week.  Follow-Up Instructions: No follow-ups on file.   Orders:  No orders of the defined types were placed in this encounter.  No orders of the defined types were placed in this encounter.     Procedures: Large Joint Inj: bilateral knee on 09/24/2024 10:39 AM Indications: diagnostic evaluation, joint swelling and pain Details: 18 G 1.5 in needle, superolateral approach  Arthrogram: No  Medications (Right): 5 mL lidocaine  1 %; 4 mL bupivacaine  0.25 %; 40 mg triamcinolone  acetonide 40 MG/ML Medications  (Left): 5 mL lidocaine  1 %; 4 mL bupivacaine  0.25 %; 40 mg triamcinolone  acetonide 40 MG/ML Outcome: tolerated well, no immediate complications Procedure, treatment alternatives, risks and benefits explained, specific risks discussed. Consent was given by the patient. Immediately prior to procedure a time out was called to verify the correct patient, procedure, equipment, support staff and site/side marked as required. Patient was prepped and draped in the usual sterile fashion.       Clinical Data: No additional findings.  Objective: Vital Signs: There were no vitals taken for this visit.  Physical Exam:  Constitutional: Patient appears well-developed HEENT:  Head: Normocephalic Eyes:EOM are normal Neck: Normal range of motion Cardiovascular: Normal rate Pulmonary/chest: Effort normal Neurologic: Patient is alert Skin: Skin is warm Psychiatric: Patient has normal mood and affect  Ortho Exam: Ortho exam demonstrates knees without cellulitis or skin changes.  Effusion present.  No calf tenderness.  Negative Homans' sign.  No pain with hip range of motion.  Able to perform straight leg raise with both lower extremities.  Lower extremities warm and well-perfused.   Specialty Comments:  No specialty comments available.  Imaging: No results found.   PMFS History: Patient Active Problem List   Diagnosis Date Noted   Material hardship due to limited financial resources 07/17/2024  Diabetic nephropathy with proteinuria (HCC) 05/15/2024   Vision problems 11/14/2023   Encounter for disability examination 11/14/2023   Vitamin D  deficiency 10/06/2023   Chronic kidney disease, stage 3 unspecified (HCC) 10/05/2023   On statin therapy 05/08/2023   Thalassemia carrier 12/27/2022   Coronary artery disease 12/26/2022   OSA (obstructive sleep apnea) 10/19/2022   Erectile dysfunction 10/19/2022   Low HDL (under 40) 10/19/2022   Elevated PSA 10/19/2022   Coronary artery calcification  10/19/2022   Chronic diastolic heart failure (HCC) 08/11/2022   Venous stasis dermatitis of both lower extremities 08/11/2022   DOE (dyspnea on exertion) 06/22/2021   OA (osteoarthritis) 11/23/2015   Venous stasis 09/20/2015   Type 2 diabetes mellitus with other specified complication (HCC) 04/23/2013   Hypertension 04/23/2013   Morbid obesity with BMI of 50.0-59.9, adult (HCC) 04/23/2013   Mixed hyperlipidemia 04/23/2013   Past Medical History:  Diagnosis Date   Allergy    Arthritis    CAP (community acquired pneumonia) 04/12/2023   Chronic diastolic heart failure (HCC) 08/11/2022   Admitted with acute hypoxemic respiratory failure in the setting of acute diastolic CHF exacerbation      Coronary artery calcification 10/19/2022   Noted on CTA  which also noted Cardiac enlargement, bilateral pleural effusions and diffuse ground-glass attenuation suggestive of mild CHF. 07/2022 referral to cardiology   Hyperlipidemia    Hypertension    OSA (obstructive sleep apnea) 10/19/2022   Sepsis (HCC) 04/12/2023   Sleep apnea    Type 2 diabetes mellitus (HCC)     Family History  Problem Relation Age of Onset   Alzheimer's disease Mother    Hypertension Father    Heart disease Father    Heart attack Father    Stroke Father    Heart attack Brother     Past Surgical History:  Procedure Laterality Date   NO PAST SURGERIES     Social History   Occupational History   Not on file  Tobacco Use   Smoking status: Former    Current packs/day: 0.00    Average packs/day: 1 pack/day for 33.0 years (33.0 ttl pk-yrs)    Types: Cigarettes    Start date: 12/11/1974    Quit date: 12/12/2007    Years since quitting: 16.7   Smokeless tobacco: Never  Vaping Use   Vaping status: Never Used  Substance and Sexual Activity   Alcohol use: Not Currently   Drug use: No   Sexual activity: Not Currently

## 2024-09-28 MED ORDER — BUPIVACAINE HCL 0.25 % IJ SOLN
4.0000 mL | INTRAMUSCULAR | Status: AC | PRN
  Administered 2024-09-24: 4 mL via INTRA_ARTICULAR

## 2024-09-28 MED ORDER — TRIAMCINOLONE ACETONIDE 40 MG/ML IJ SUSP
40.0000 mg | INTRAMUSCULAR | Status: AC | PRN
  Administered 2024-09-24: 40 mg via INTRA_ARTICULAR

## 2024-09-28 MED ORDER — LIDOCAINE HCL 1 % IJ SOLN
5.0000 mL | INTRAMUSCULAR | Status: AC | PRN
  Administered 2024-09-24: 5 mL

## 2024-10-08 ENCOUNTER — Other Ambulatory Visit (HOSPITAL_COMMUNITY): Payer: Self-pay | Admitting: Urology

## 2024-10-08 DIAGNOSIS — R972 Elevated prostate specific antigen [PSA]: Secondary | ICD-10-CM

## 2024-10-09 ENCOUNTER — Telehealth: Payer: Self-pay | Admitting: Internal Medicine

## 2024-10-09 NOTE — Telephone Encounter (Signed)
 [employer] faxed disability claim forms, to be filled out by provider. Patient requested to send it back via Fax within ASAP. Document is located in providers tray at front office.Please advise at [phone].

## 2024-10-13 ENCOUNTER — Telehealth: Payer: Self-pay

## 2024-10-13 ENCOUNTER — Encounter: Payer: Self-pay | Admitting: Radiology

## 2024-10-13 NOTE — Telephone Encounter (Signed)
 Received papers will work on then give to provider to finish up

## 2024-10-13 NOTE — Telephone Encounter (Signed)
 Placed on provider desk to finish

## 2024-10-13 NOTE — Telephone Encounter (Signed)
 Faxed paper work to [employer] for pt at [phone]

## 2024-10-20 ENCOUNTER — Ambulatory Visit (INDEPENDENT_AMBULATORY_CARE_PROVIDER_SITE_OTHER): Admitting: Internal Medicine

## 2024-10-20 ENCOUNTER — Encounter: Payer: Self-pay | Admitting: Internal Medicine

## 2024-10-20 VITALS — BP 110/62 | HR 66 | Temp 98.3°F | Ht 71.0 in | Wt 345.6 lb

## 2024-10-20 DIAGNOSIS — E1169 Type 2 diabetes mellitus with other specified complication: Secondary | ICD-10-CM

## 2024-10-20 DIAGNOSIS — E559 Vitamin D deficiency, unspecified: Secondary | ICD-10-CM

## 2024-10-20 DIAGNOSIS — R252 Cramp and spasm: Secondary | ICD-10-CM

## 2024-10-20 DIAGNOSIS — Z7985 Long-term (current) use of injectable non-insulin antidiabetic drugs: Secondary | ICD-10-CM

## 2024-10-20 DIAGNOSIS — R972 Elevated prostate specific antigen [PSA]: Secondary | ICD-10-CM

## 2024-10-20 DIAGNOSIS — Z79899 Other long term (current) drug therapy: Secondary | ICD-10-CM | POA: Diagnosis not present

## 2024-10-20 DIAGNOSIS — N1831 Chronic kidney disease, stage 3a: Secondary | ICD-10-CM | POA: Diagnosis not present

## 2024-10-20 DIAGNOSIS — I5032 Chronic diastolic (congestive) heart failure: Secondary | ICD-10-CM

## 2024-10-20 DIAGNOSIS — Z6841 Body Mass Index (BMI) 40.0 and over, adult: Secondary | ICD-10-CM | POA: Diagnosis not present

## 2024-10-20 LAB — MICROALBUMIN / CREATININE URINE RATIO
Creatinine,U: 246.4 mg/dL
Microalb Creat Ratio: 51.3 mg/g — ABNORMAL HIGH (ref 0.0–30.0)
Microalb, Ur: 12.6 mg/dL — ABNORMAL HIGH (ref 0.0–1.9)

## 2024-10-20 LAB — MAGNESIUM: Magnesium: 2.1 mg/dL (ref 1.5–2.5)

## 2024-10-20 LAB — CK: Total CK: 108 U/L (ref 17–232)

## 2024-10-20 NOTE — Progress Notes (Signed)
 ==============================  [facility]: [phone]   -- Medical Office Visit --  Patient: Jason Meyer      Age: 63 y.o.       Sex:  male  Date:   10/20/2024 Today's Healthcare Provider: Bernardino KANDICE Cone, MD  ==============================   Chief Complaint: Disability follow up   Discussed the use of AI scribe software for clinical note transcription with the patient, who gave verbal consent to proceed.  History of Present Illness 63 year old male with obesity and hypertension who presents for follow-up on weight loss and blood pressure management.  He has lost 20 pounds over the past two months, reducing his weight from 361 pounds to 345 pounds, attributed to the use of Mounjaro  at a 15 mg dose and regular exercise, including leg and upper body workouts at a therapy facility. No issues with the medication are reported.  He is monitoring his blood sugar levels and has not experienced hypoglycemia despite the weight loss. He is concerned about his blood pressure, recorded at 110/60 mmHg. He is taking Azor  (amlodipine  and olmesartan ) and Lasix  (furosemide ) for blood pressure management. He recently started hydrochlorothiazide .  He recently visited a specialist for prostate concerns and was advised to undergo an MRI due to a urinary tract infection that elevated his PSA levels. He is currently on a sulfa  antibiotic, taken twice daily, to address the infection.  He has a history of heart failure and is not currently seeing a cardiologist regularly. He mentions improved breathing and increased ability to walk further distances with weight loss. He is taking several heart failure medications and is monitoring his fluid intake and exercise regimen to avoid exacerbating his condition.  He experiences nocturnal leg cramps and is taking a potassium supplement. He recently started hydrochlorothiazide , which may have contributed to electrolyte  imbalances.  He has a history of stage 3 kidney disease with proteinuria, attributed to long-standing hypertension and the use of diuretics. He is currently taking Jardiance  to manage this condition and reports compliance with the medication.  He uses a CPAP machine for sleep apnea and reports stable vision, although he has a cataract in his left eye. He has an eye appointment scheduled for March.  He has not received a pneumonia shot and does not typically get flu shots. He has small red blood cells, possibly due to thalassemia, which he understands as a genetic trait providing some protection against malaria.  Wt Readings from Last 50 Encounters:  10/20/24 (!) 345 lb 9.6 oz (156.8 kg)  09/09/24 (!) 359 lb 3.2 oz (162.9 kg)  08/19/24 (!) 358 lb 12.8 oz (162.8 kg)  07/17/24 (!) 361 lb 12.8 oz (164.1 kg)  05/28/24 (!) 358 lb 3.2 oz (162.5 kg)  05/15/24 (!) 361 lb 6.4 oz (163.9 kg)  05/09/24 (!) 363 lb (164.7 kg)  04/14/24 (!) 371 lb 12.8 oz (168.6 kg)  01/14/24 (!) 369 lb 6.4 oz (167.6 kg)  11/14/23 (!) 370 lb (167.8 kg)  10/05/23 (!) 371 lb 9.6 oz (168.6 kg)  09/07/23 (!) 384 lb 3.2 oz (174.3 kg)  05/08/23 (!) 375 lb 6.4 oz (170.3 kg)  04/25/23 (!) 364 lb (165.1 kg)  04/13/23 (!) 379 lb 3.1 oz (172 kg)  02/06/23 (!) 380 lb 9.6 oz (172.6 kg)  12/26/22 (!) 376 lb 3.2 oz (170.6 kg)  10/19/22 (!) 390 lb (176.9 kg)  09/14/22 (!) 389 lb 6.4 oz (176.6 kg)  08/23/22 (!) 388 lb (176 kg)  08/11/22 (!) 386 lb 6.4 oz (175.3 kg)  08/01/22 (!) 384 lb 6.4 oz (174.4 kg)  07/14/22 (!) 399 lb (181 kg)  04/27/22 (!) 388 lb (176 kg)  03/29/22 (!) 386 lb (175.1 kg)  02/22/22 (!) 386 lb (175.1 kg)  02/10/22 (!) 378 lb 15.5 oz (171.9 kg)  12/22/21 (!) 393 lb (178.3 kg)  11/24/21 (!) 391 lb (177.4 kg)  11/18/21 (!) 392 lb (177.8 kg)  11/17/21 (!) 400 lb (181.4 kg)  10/13/21 (!) 395 lb 1.3 oz (179.2 kg)  10/06/21 (!) 390 lb 6.4 oz (177.1 kg)  09/26/21 (!) 395 lb 12.8 oz (179.5 kg)  09/16/21 (!) 393  lb (178.3 kg)  08/17/21 (!) 386 lb 9.6 oz (175.4 kg)  06/22/21 (!) 391 lb (177.4 kg)  05/26/21 (!) 384 lb (174.2 kg)  05/03/21 (!) 382 lb (173.3 kg)  01/19/21 (!) 388 lb (176 kg)  09/15/20 (!) 386 lb (175.1 kg)  06/09/20 (!) 400 lb (181.4 kg)  08/26/19 (!) 408 lb (185.1 kg)  05/21/19 (!) 415 lb 12.8 oz (188.6 kg)  03/11/19 (!) 407 lb (184.6 kg)  08/07/18 (!) 423 lb 3.2 oz (192 kg)  06/27/18 (!) 417 lb 9.6 oz (189.4 kg)  04/08/18 (!) 403 lb 3.2 oz (182.9 kg)  01/03/18 (!) 404 lb 12.8 oz (183.6 kg)  12/07/17 (!) 412 lb (186.9 kg)   BP Readings from Last 20 Encounters:  10/20/24 110/62  09/09/24 126/72  08/19/24 136/78  07/17/24 (!) 140/78  05/15/24 (!) 140/80  05/09/24 (!) 160/90  04/14/24 (!) 150/88  01/14/24 136/86  11/14/23 134/73  10/05/23 (!) 146/82  09/07/23 116/73  05/08/23 122/73  04/25/23 133/67  04/17/23 (!) 115/48  02/06/23 130/78  12/26/22 114/68  10/19/22 (!) 152/75  09/14/22 (!) 144/77  08/11/22 134/70  08/01/22 132/72   Lab Results  Component Value Date   HGBA1C 6.7 (H) 07/17/2024   HGBA1C 6.5 04/14/2024   HGBA1C 6.3 01/14/2024    Lab Results  Component Value Date   PSA1 8.9 (H) 12/22/2021   PSA 11.46 (H) 04/14/2024   PSA 11.53 (H) 05/08/2023   PSA 2.2 07/06/2014   Lab Results  Component Value Date   VD25OH 59.82 07/17/2024   VD25OH 23.85 (L) 04/14/2024   VD25OH 32.30 01/14/2024   VD25OH 10 (L) 10/05/2023   VD25OH 9.1 (L) 01/06/2015    Background Reviewed: Problem List: has Type 2 diabetes mellitus with other specified complication (HCC); Hypertension; Morbid obesity with BMI of 50.0-59.9, adult (HCC); Mixed hyperlipidemia; Venous stasis; OA (osteoarthritis); DOE (dyspnea on exertion); Chronic diastolic heart failure (HCC); Venous stasis dermatitis of both lower extremities; OSA (obstructive sleep apnea); Erectile dysfunction; Low HDL (under 40); Elevated PSA; Coronary artery calcification; Coronary artery disease; On statin therapy; Chronic  kidney disease, stage 3 unspecified (HCC); Vitamin D  deficiency; Vision problems; Encounter for disability examination; 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 Past Medical History:  has a past medical history of Allergy, Arthritis, CAP (community acquired pneumonia) (04/12/2023), Chronic diastolic heart failure (HCC) (90/98/7976), Coronary artery calcification (10/19/2022), Hyperlipidemia, Hypertension, OSA (obstructive sleep apnea) (10/19/2022), Sepsis (HCC) (04/12/2023), Sleep apnea, and Type 2 diabetes mellitus (HCC). Past Surgical History:   has a past surgical history that includes No past surgeries. Social History:   reports that he quit smoking about 16 years ago. His smoking use included cigarettes. He started smoking about 49 years ago. He has a 33 pack-year  smoking history. He has never used smokeless tobacco. He reports that he does not currently use alcohol. He reports that he does not use drugs. Family History:  family history includes Alzheimer's disease in his mother; Heart attack in his brother and father; Heart disease in his father; Hypertension in his father; Stroke in his father. Allergies:  is allergic to penicillins.   Medication Reconciliation: Current Outpatient Medications on File Prior to Visit  Medication Sig   sulfamethoxazole -trimethoprim  (BACTRIM  DS) 800-160 MG tablet Take 1 tablet by mouth 2 (two) times daily.   acetaminophen  (TYLENOL ) 325 MG tablet Take 2 tablets (650 mg total) by mouth every 6 (six) hours as needed for mild pain (or Fever >/= 101).   albuterol  (PROVENTIL ) (2.5 MG/3ML) 0.083% nebulizer solution Take 3 mLs (2.5 mg total) by nebulization every 6 (six) hours as needed for wheezing or shortness of breath.   albuterol  (VENTOLIN  HFA) 108 (90 Base) MCG/ACT inhaler Inhale 2 puffs into the lungs every 6 (six) hours as needed for  wheezing or shortness of breath.   amLODipine -olmesartan  (AZOR ) 10-40 MG tablet Take 1 tablet by mouth daily. Replaces amlodipine  and olmesartan    ASPIRIN  EC 81 MG EC tablet Take 1 tablet (81 mg total) by mouth daily.   Azelastine  HCl 137 MCG/SPRAY SOLN Place 1 spray into the nose daily.   bisoprolol  (ZEBETA ) 10 MG tablet Take 1 tablet (10 mg total) by mouth daily.   buPROPion  (WELLBUTRIN  XL) 150 MG 24 hr tablet TAKE 1 TABLET BY MOUTH EVERY DAY   celecoxib  (CELEBREX ) 200 MG capsule TAKE 1 CAPSULE (200 MG TOTAL) BY MOUTH 2 (TWO) TIMES DAILY. REPLACES MELOXICAM    Cholecalciferol (VITAMIN D3) 125 MCG (5000 UT) CAPS Take 1 capsule (5,000 Units total) by mouth daily.   clotrimazole  (LOTRIMIN ) 1 % cream Apply topically daily.   clotrimazole -betamethasone  (LOTRISONE ) cream APPLY TO AFFECTED AREA TWICE A DAY   empagliflozin  (JARDIANCE ) 25 MG TABS tablet Take 1 tablet (25 mg total) by mouth daily before breakfast.   Evolocumab  (REPATHA  SURECLICK) 140 MG/ML SOAJ Inject 140 mg into the skin every 14 (fourteen) days.   ezetimibe  (ZETIA ) 10 MG tablet Take 1 tablet (10 mg total) by mouth daily.   famotidine -calcium  carbonate-magnesium hydroxide (PEPCID  COMPLETE) 10-800-165 MG chewable tablet Chew 1 tablet by mouth 2 (two) times daily as needed.   Finerenone  (KERENDIA ) 20 MG TABS Take 1 tablet (20 mg total) by mouth daily. Replaces spironolactone  if approved, start at 10 mg for first 4 weeks (half tablets)   fluticasone  (FLONASE ) 50 MCG/ACT nasal spray Place 2 sprays into both nostrils daily.   fluticasone  furoate-vilanterol (BREO ELLIPTA ) 100-25 MCG/ACT AEPB Inhale 1 puff into the lungs daily.   furosemide  (LASIX ) 40 MG tablet Take 2 tablets (80 mg total) by mouth daily. Must take with spironolactone  to keep potassium up Reduce dose to just 40 mg daily if no fluid is accumulating.   metFORMIN  (GLUCOPHAGE ) 1000 MG tablet Take 1 tablet (1,000 mg total) by mouth 2 (two) times daily with a meal.   potassium  chloride (MICRO-K ) 10 MEQ CR capsule TAKE 1 CAPSULE BY MOUTH EVERY DAY   rosuvastatin  (CRESTOR ) 40 MG tablet Take 1 tablet (40 mg total) by mouth daily. Replaces atorvastatin  which failed to reach Low Density Lipoprotein (LDL cholesterol) goal of 55, take only if repatha  not approved/available.   spironolactone  (ALDACTONE ) 50 MG tablet Take 1 tablet (50 mg total) by mouth daily. Replaces 25 mg dose   tirzepatide  (MOUNJARO ) 15 MG/0.5ML Pen Inject 15 mg  into the skin once a week. Replaces 12.5 mg dose   No current facility-administered medications on file prior to visit.   Medications Discontinued During This Encounter  Medication Reason   hydrochlorothiazide  (HYDRODIURIL ) 25 MG tablet Completed Course     Physical Exam:    10/20/2024   10:37 AM 09/09/2024    9:57 AM 08/19/2024   10:35 AM  Vitals with BMI  Height 5' 11 5' 11 5' 11  Weight 345 lbs 10 oz 359 lbs 3 oz 358 lbs 13 oz  BMI 48.22 50.12 50.06  Systolic 110 126 863  Diastolic 62 72 78  Pulse 66 60 85  Vital signs reviewed.  Nursing notes reviewed. Weight trend reviewed. Physical Activity: Not on file   General Appearance:  No acute distress appreciable.   Well-groomed, healthy-appearing male.  Well proportioned with no abnormal fat distribution.  Good muscle tone. Pulmonary:  Normal work of breathing at rest, no respiratory distress apparent. SpO2: 98 %  Musculoskeletal: All extremities are intact.  Neurological:  Awake, alert, oriented, and engaged.  No obvious focal neurological deficits or cognitive impairments.  Sensorium seems unclouded.   Speech is clear and coherent with logical content. Psychiatric:  Appropriate mood, pleasant and cooperative demeanor, thoughtful and engaged during the exam   Verbalized to patient: Physical Exam VITALS: BP- 110/60 MEASUREMENTS: Weight- 345. EXTREMITIES: Ankles with edema despite compression stocking adherence.   Results:   Verbalized to patient: Results LABS Vitamin D :  within normal limits (07/2024) PSA: elevated Urinalysis: proteinuria, five times normal (04/2024) CBC: microcytic anemia     07/28/2024    9:42 AM 05/15/2024   10:34 AM 05/09/2024   10:37 AM 01/14/2024    2:16 PM  PHQ 2/9 Scores  PHQ - 2 Score 0 0 0 0  PHQ- 9 Score  0  0       Data saved with a previous flowsheet row definition    {   Results for orders placed or performed in visit on 10/20/24  Magnesium  Result Value Ref Range   Magnesium 2.1 1.5 - 2.5 mg/dL  CK (Creatine Kinase)  Result Value Ref Range   Total CK 108 17 - 232 U/L  Microalbumin / creatinine urine ratio  Result Value Ref Range   Microalb, Ur 12.6 (H) 0.0 - 1.9 mg/dL   Creatinine,U 753.5 mg/dL   Microalb Creat Ratio 51.3 (H) 0.0 - 30.0 mg/g  } Office Visit on 10/20/2024  Component Date Value Ref Range Status   Magnesium 10/20/2024 2.1  1.5 - 2.5 mg/dL Final   Total CK 88/89/7974 108  17 - 232 U/L Final   Microalb, Ur 10/20/2024 12.6 (H)  0.0 - 1.9 mg/dL Final   Creatinine,U 88/89/7974 246.4  mg/dL Final   Microalb Creat Ratio 10/20/2024 51.3 (H)  0.0 - 30.0 mg/g Final  Office Visit on 07/17/2024  Component Date Value Ref Range Status   WBC 07/17/2024 8.7  4.0 - 10.5 K/uL Final   RBC 07/17/2024 6.24 (H)  4.22 - 5.81 Mil/uL Final   Hemoglobin 07/17/2024 15.9  13.0 - 17.0 g/dL Final   HCT 91/92/7974 49.7  39.0 - 52.0 % Final   MCV 07/17/2024 79.6  78.0 - 100.0 fl Final   MCHC 07/17/2024 32.1  30.0 - 36.0 g/dL Final   RDW 91/92/7974 17.9 (H)  11.5 - 15.5 % Final   Platelets 07/17/2024 245.0  150.0 - 400.0 K/uL Final   Neutrophils Relative % 07/17/2024 72.6  43.0 - 77.0 %  Final   Lymphocytes Relative 07/17/2024 18.5  12.0 - 46.0 % Final   Monocytes Relative 07/17/2024 7.1  3.0 - 12.0 % Final   Eosinophils Relative 07/17/2024 1.3  0.0 - 5.0 % Final   Basophils Relative 07/17/2024 0.5  0.0 - 3.0 % Final   Neutro Abs 07/17/2024 6.3  1.4 - 7.7 K/uL Final   Lymphs Abs 07/17/2024 1.6  0.7 - 4.0 K/uL Final    Monocytes Absolute 07/17/2024 0.6  0.1 - 1.0 K/uL Final   Eosinophils Absolute 07/17/2024 0.1  0.0 - 0.7 K/uL Final   Basophils Absolute 07/17/2024 0.0  0.0 - 0.1 K/uL Final   Sodium 07/17/2024 139  135 - 145 mEq/L Final   Potassium 07/17/2024 4.8  3.5 - 5.1 mEq/L Final   Chloride 07/17/2024 102  96 - 112 mEq/L Final   CO2 07/17/2024 25  19 - 32 mEq/L Final   Glucose, Bld 07/17/2024 85  70 - 99 mg/dL Final   BUN 91/92/7974 16  6 - 23 mg/dL Final   Creatinine, Ser 07/17/2024 1.39  0.40 - 1.50 mg/dL Final   Total Bilirubin 07/17/2024 0.5  0.2 - 1.2 mg/dL Final   Alkaline Phosphatase 07/17/2024 67  39 - 117 U/L Final   AST 07/17/2024 17  0 - 37 U/L Final   ALT 07/17/2024 19  0 - 53 U/L Final   Total Protein 07/17/2024 7.4  6.0 - 8.3 g/dL Final   Albumin 91/92/7974 4.2  3.5 - 5.2 g/dL Final   GFR 91/92/7974 54.66 (L)  >60.00 mL/min Final   Calcium  07/17/2024 10.0  8.4 - 10.5 mg/dL Final   Cholesterol 91/92/7974 136  0 - 200 mg/dL Final   Triglycerides 91/92/7974 86.0  0.0 - 149.0 mg/dL Final   HDL 91/92/7974 42.80  >39.00 mg/dL Final   VLDL 91/92/7974 17.2  0.0 - 40.0 mg/dL Final   LDL Cholesterol 07/17/2024 76  0 - 99 mg/dL Final   Total CHOL/HDL Ratio 07/17/2024 3   Final   NonHDL 07/17/2024 93.49   Final   Hgb A1c MFr Bld 07/17/2024 6.7 (H)  4.6 - 6.5 % Final   Microalb, Ur 07/17/2024 3.8 (H)  0.0 - 1.9 mg/dL Final   Creatinine,U 91/92/7974 176.2  mg/dL Final   Microalb Creat Ratio 07/17/2024 21.7  0.0 - 30.0 mg/g Final   VITD 07/17/2024 59.82  30.00 - 100.00 ng/mL Final  Office Visit on 04/14/2024  Component Date Value Ref Range Status   Sodium 04/14/2024 139  135 - 145 mEq/L Final   Potassium 04/14/2024 4.2  3.5 - 5.1 mEq/L Final   Chloride 04/14/2024 105  96 - 112 mEq/L Final   CO2 04/14/2024 25  19 - 32 mEq/L Final   Glucose, Bld 04/14/2024 110 (H)  70 - 99 mg/dL Final   BUN 94/94/7974 13  6 - 23 mg/dL Final   Creatinine, Ser 04/14/2024 1.38  0.40 - 1.50 mg/dL Final   Total  Bilirubin 04/14/2024 0.6  0.2 - 1.2 mg/dL Final   Alkaline Phosphatase 04/14/2024 63  39 - 117 U/L Final   AST 04/14/2024 15  0 - 37 U/L Final   ALT 04/14/2024 15  0 - 53 U/L Final   Total Protein 04/14/2024 6.5  6.0 - 8.3 g/dL Final   Albumin 94/94/7974 4.0  3.5 - 5.2 g/dL Final   GFR 94/94/7974 55.24 (L)  >60.00 mL/min Final   Calcium  04/14/2024 9.0  8.4 - 10.5 mg/dL Final   VITD 94/94/7974 23.85 (L)  30.00 - 100.00 ng/mL Final   Creatinine, Urine 04/14/2024 156  20 - 320 mg/dL Final   Protein/Creat Ratio 04/14/2024 545 (H)  25 - 148 mg/g creat Final   Protein/Creatinine Ratio 04/14/2024 0.545 (H)  0.025 - 0.148 mg/mg creat Final   Total Protein, Urine 04/14/2024 85 (H)  5 - 25 mg/dL Final   PSA 94/94/7974 11.46 (H)  0.10 - 4.00 ng/mL Final   Hgb A1c MFr Bld 04/14/2024 6.5  4.6 - 6.5 % Final   WBC 04/14/2024 8.5  4.0 - 10.5 K/uL Final   RBC 04/14/2024 6.00 (H)  4.22 - 5.81 Mil/uL Final   Hemoglobin 04/14/2024 15.6  13.0 - 17.0 g/dL Final   HCT 94/94/7974 47.7  39.0 - 52.0 % Final   MCV 04/14/2024 79.5  78.0 - 100.0 fl Final   MCHC 04/14/2024 32.8  30.0 - 36.0 g/dL Final   RDW 94/94/7974 16.3 (H)  11.5 - 15.5 % Final   Platelets 04/14/2024 175.0  150.0 - 400.0 K/uL Final   Neutrophils Relative % 04/14/2024 72.9  43.0 - 77.0 % Final   Lymphocytes Relative 04/14/2024 17.2  12.0 - 46.0 % Final   Monocytes Relative 04/14/2024 6.2  3.0 - 12.0 % Final   Eosinophils Relative 04/14/2024 3.1  0.0 - 5.0 % Final   Basophils Relative 04/14/2024 0.6  0.0 - 3.0 % Final   Neutro Abs 04/14/2024 6.2  1.4 - 7.7 K/uL Final   Lymphs Abs 04/14/2024 1.5  0.7 - 4.0 K/uL Final   Monocytes Absolute 04/14/2024 0.5  0.1 - 1.0 K/uL Final   Eosinophils Absolute 04/14/2024 0.3  0.0 - 0.7 K/uL Final   Basophils Absolute 04/14/2024 0.1  0.0 - 0.1 K/uL Final  Office Visit on 01/14/2024  Component Date Value Ref Range Status   WBC 01/14/2024 9.1  4.0 - 10.5 K/uL Final   RBC 01/14/2024 5.99 (H)  4.22 - 5.81 Mil/uL  Final   Hemoglobin 01/14/2024 15.4  13.0 - 17.0 g/dL Final   HCT 97/96/7974 47.8  39.0 - 52.0 % Final   MCV 01/14/2024 79.7  78.0 - 100.0 fl Final   MCHC 01/14/2024 32.2  30.0 - 36.0 g/dL Final   RDW 97/96/7974 17.8 (H)  11.5 - 15.5 % Final   Platelets 01/14/2024 220.0  150.0 - 400.0 K/uL Final   Neutrophils Relative % 01/14/2024 74.3  43.0 - 77.0 % Final   Lymphocytes Relative 01/14/2024 18.1  12.0 - 46.0 % Final   Monocytes Relative 01/14/2024 4.8  3.0 - 12.0 % Final   Eosinophils Relative 01/14/2024 1.9  0.0 - 5.0 % Final   Basophils Relative 01/14/2024 0.9  0.0 - 3.0 % Final   Neutro Abs 01/14/2024 6.7  1.4 - 7.7 K/uL Final   Lymphs Abs 01/14/2024 1.6  0.7 - 4.0 K/uL Final   Monocytes Absolute 01/14/2024 0.4  0.1 - 1.0 K/uL Final   Eosinophils Absolute 01/14/2024 0.2  0.0 - 0.7 K/uL Final   Basophils Absolute 01/14/2024 0.1  0.0 - 0.1 K/uL Final   Sodium 01/14/2024 140  135 - 145 mEq/L Final   Potassium 01/14/2024 4.3  3.5 - 5.1 mEq/L Final   Chloride 01/14/2024 103  96 - 112 mEq/L Final   CO2 01/14/2024 28  19 - 32 mEq/L Final   Glucose, Bld 01/14/2024 89  70 - 99 mg/dL Final   BUN 97/96/7974 14  6 - 23 mg/dL Final   Creatinine, Ser 01/14/2024 1.33  0.40 - 1.50 mg/dL Final  Total Bilirubin 01/14/2024 0.5  0.2 - 1.2 mg/dL Final   Alkaline Phosphatase 01/14/2024 67  39 - 117 U/L Final   AST 01/14/2024 16  0 - 37 U/L Final   ALT 01/14/2024 15  0 - 53 U/L Final   Total Protein 01/14/2024 7.1  6.0 - 8.3 g/dL Final   Albumin 97/96/7974 4.1  3.5 - 5.2 g/dL Final   GFR 97/96/7974 57.84 (L)  >60.00 mL/min Final   Calcium  01/14/2024 9.3  8.4 - 10.5 mg/dL Final   Cholesterol 97/96/7974 125  0 - 200 mg/dL Final   Triglycerides 97/96/7974 93.0  0.0 - 149.0 mg/dL Final   HDL 97/96/7974 40.10  >39.00 mg/dL Final   VLDL 97/96/7974 18.6  0.0 - 40.0 mg/dL Final   LDL Cholesterol 01/14/2024 66  0 - 99 mg/dL Final   Total CHOL/HDL Ratio 01/14/2024 3   Final   NonHDL 01/14/2024 84.77   Final    Hgb A1c MFr Bld 01/14/2024 6.3  4.6 - 6.5 % Final   Interpretation - HGBRFX 01/14/2024 see note   Final   Reviewed By: 01/14/2024 see note   Final   Hgb A 01/14/2024 97.5  >96.0 % Final   Fetal Hemoglobin Testing 01/14/2024 0.0  <2.0 % Final   Hemoglobin A2 - HGBRFX 01/14/2024 2.5  2.0 - 3.2 % Final   C-Z Electrophoresis 01/14/2024 Confirms   Final   RBC 01/14/2024 6.15 (H)  4.20 - 5.80 Mill/uL Final   Hemoglobin 01/14/2024 15.5  13.2 - 17.1 g/dL Final   HCT 97/96/7974 50.2 (H)  38.5 - 50.0 % Final   MCV 01/14/2024 81.6  80.0 - 100.0 fL Final   MCH 01/14/2024 25.2 (L)  27.0 - 33.0 pg Final   MCHC 01/14/2024 30.9 (L)  32.0 - 36.0 g/dL Final   RDW 97/96/7974 17.3 (H)  11.0 - 15.0 % Final   Ferritin 01/14/2024 55  24 - 380 ng/mL Final   VITD 01/14/2024 32.30  30.00 - 100.00 ng/mL Final   PTH 01/14/2024 35  16 - 77 pg/mL Final   PSA, Total 01/14/2024 11.7 (H)  < OR = 4.0 ng/mL Final   PSA, Free 01/14/2024 1.0  ng/mL Final   PSA, % Free 01/14/2024 NOT CALCULATED  >25 % (calc) Final   Magnesium 01/14/2024 1.7  1.5 - 2.5 mg/dL Final   Uric Acid, Serum 01/14/2024 5.4  4.0 - 7.8 mg/dL Final   Ferritin 97/96/7974 44.0  22.0 - 322.0 ng/mL Final   Vitamin B-12 01/14/2024 212  211 - 911 pg/mL Final   Summary Report 01/14/2024 see note   Final   Reviewed By: 01/14/2024 see note   Final   Results Recieved 01/14/2024 01/25/24   Final   Alpha-Thalassemia 01/14/2024 see note   Final  Office Visit on 10/05/2023  Component Date Value Ref Range Status   Vit D, 25-Hydroxy 10/05/2023 10 (L)  30 - 100 ng/mL Final   Uric Acid, Serum 10/05/2023 5.4  4.0 - 8.0 mg/dL Final   PTH 89/74/7975 13 (L)  16 - 77 pg/mL Final   Phosphorus 10/05/2023 3.2  2.5 - 4.5 mg/dL Final   Magnesium 89/74/7975 1.9  1.5 - 2.5 mg/dL Final   Glucose, Bld 89/74/7975 85  65 - 99 mg/dL Final   BUN 89/74/7975 14  7 - 25 mg/dL Final   Creat 89/74/7975 1.22  0.70 - 1.35 mg/dL Final   BUN/Creatinine Ratio 10/05/2023 SEE NOTE:  6 - 22  (calc) Final   Sodium  10/05/2023 141  135 - 146 mmol/L Final   Potassium 10/05/2023 4.6  3.5 - 5.3 mmol/L Final   Chloride 10/05/2023 102  98 - 110 mmol/L Final   CO2 10/05/2023 28  20 - 32 mmol/L Final   Calcium  10/05/2023 9.8  8.6 - 10.3 mg/dL Final  Office Visit on 09/07/2023  Component Date Value Ref Range Status   WBC 09/07/2023 9.5  3.8 - 10.8 Thousand/uL Final   RBC 09/07/2023 6.28 (H)  4.20 - 5.80 Million/uL Final   Hemoglobin 09/07/2023 15.6  13.2 - 17.1 g/dL Final   HCT 90/72/7975 49.5  38.5 - 50.0 % Final   MCV 09/07/2023 78.8 (L)  80.0 - 100.0 fL Final   MCH 09/07/2023 24.8 (L)  27.0 - 33.0 pg Final   MCHC 09/07/2023 31.5 (L)  32.0 - 36.0 g/dL Final   RDW 90/72/7975 14.8  11.0 - 15.0 % Final   Platelets 09/07/2023 270  140 - 400 Thousand/uL Final   MPV 09/07/2023 10.8  7.5 - 12.5 fL Final   Neutro Abs 09/07/2023 6,603  1,500 - 7,800 cells/uL Final   Lymphs Abs 09/07/2023 1,957  850 - 3,900 cells/uL Final   Absolute Monocytes 09/07/2023 751  200 - 950 cells/uL Final   Eosinophils Absolute 09/07/2023 133  15 - 500 cells/uL Final   Basophils Absolute 09/07/2023 57  0 - 200 cells/uL Final   Neutrophils Relative % 09/07/2023 69.5  % Final   Total Lymphocyte 09/07/2023 20.6  % Final   Monocytes Relative 09/07/2023 7.9  % Final   Eosinophils Relative 09/07/2023 1.4  % Final   Basophils Relative 09/07/2023 0.6  % Final   Glucose, Bld 09/07/2023 78  65 - 99 mg/dL Final   BUN 90/72/7975 14  7 - 25 mg/dL Final   Creat 90/72/7975 1.24  0.70 - 1.35 mg/dL Final   BUN/Creatinine Ratio 09/07/2023 SEE NOTE:  6 - 22 (calc) Final   Sodium 09/07/2023 140  135 - 146 mmol/L Final   Potassium 09/07/2023 4.0  3.5 - 5.3 mmol/L Final   Chloride 09/07/2023 106  98 - 110 mmol/L Final   CO2 09/07/2023 22  20 - 32 mmol/L Final   Calcium  09/07/2023 9.2  8.6 - 10.3 mg/dL Final   Total Protein 90/72/7975 6.7  6.1 - 8.1 g/dL Final   Albumin 90/72/7975 4.1  3.6 - 5.1 g/dL Final   Globulin 90/72/7975  2.6  1.9 - 3.7 g/dL (calc) Final   AG Ratio 09/07/2023 1.6  1.0 - 2.5 (calc) Final   Total Bilirubin 09/07/2023 0.4  0.2 - 1.2 mg/dL Final   Alkaline phosphatase (APISO) 09/07/2023 75  35 - 144 U/L Final   AST 09/07/2023 21  10 - 35 U/L Final   ALT 09/07/2023 24  9 - 46 U/L Final   Cholesterol 09/07/2023 113  <200 mg/dL Final   HDL 90/72/7975 37 (L)  > OR = 40 mg/dL Final   Triglycerides 90/72/7975 73  <150 mg/dL Final   LDL Cholesterol (Calc) 09/07/2023 61  mg/dL (calc) Final   Total CHOL/HDL Ratio 09/07/2023 3.1  <4.9 (calc) Final   Non-HDL Cholesterol (Calc) 09/07/2023 76  <130 mg/dL (calc) Final   Hgb J8r MFr Bld 09/07/2023 6.7 (H)  <5.7 % of total Hgb Final   Mean Plasma Glucose 09/07/2023 146  mg/dL Final   eAG (mmol/L) 90/72/7975 8.1  mmol/L Final   Creatinine, Urine 09/07/2023 218  20 - 320 mg/dL Final   Microalb, Ur 90/72/7975 8.5  mg/dL Final   Microalb Creat Ratio 09/07/2023 39 (H)  <30 mg/g creat Final  Office Visit on 05/08/2023  Component Date Value Ref Range Status   PSA 05/08/2023 11.53 (H)  0.10 - 4.00 ng/mL Final   Sodium 05/08/2023 139  135 - 145 mEq/L Final   Potassium 05/08/2023 3.9  3.5 - 5.1 mEq/L Final   Chloride 05/08/2023 105  96 - 112 mEq/L Final   CO2 05/08/2023 24  19 - 32 mEq/L Final   Glucose, Bld 05/08/2023 140 (H)  70 - 99 mg/dL Final   BUN 94/71/7975 13  6 - 23 mg/dL Final   Creatinine, Ser 05/08/2023 1.12  0.40 - 1.50 mg/dL Final   GFR 94/71/7975 71.43  >60.00 mL/min Final   Calcium  05/08/2023 9.0  8.4 - 10.5 mg/dL Final   WBC 94/71/7975 7.9  4.0 - 10.5 K/uL Final   RBC 05/08/2023 5.90 (H)  4.22 - 5.81 Mil/uL Final   Hemoglobin 05/08/2023 14.5  13.0 - 17.0 g/dL Final   HCT 94/71/7975 45.5  39.0 - 52.0 % Final   MCV 05/08/2023 77.1 (L)  78.0 - 100.0 fl Final   MCHC 05/08/2023 31.8  30.0 - 36.0 g/dL Final   RDW 94/71/7975 18.3 (H)  11.5 - 15.5 % Final   Platelets 05/08/2023 196.0  150.0 - 400.0 K/uL Final   Neutrophils Relative % 05/08/2023 73.8   43.0 - 77.0 % Final   Lymphocytes Relative 05/08/2023 18.4  12.0 - 46.0 % Final   Monocytes Relative 05/08/2023 5.3  3.0 - 12.0 % Final   Eosinophils Relative 05/08/2023 2.0  0.0 - 5.0 % Final   Basophils Relative 05/08/2023 0.5  0.0 - 3.0 % Final   Neutro Abs 05/08/2023 5.8  1.4 - 7.7 K/uL Final   Lymphs Abs 05/08/2023 1.4  0.7 - 4.0 K/uL Final   Monocytes Absolute 05/08/2023 0.4  0.1 - 1.0 K/uL Final   Eosinophils Absolute 05/08/2023 0.2  0.0 - 0.7 K/uL Final   Basophils Absolute 05/08/2023 0.0  0.0 - 0.1 K/uL Final   Ferritin 05/08/2023 38.7  22.0 - 322.0 ng/mL Final  No results displayed because visit has over 200 results.    Office Visit on 12/26/2022  Component Date Value Ref Range Status   WBC 12/26/2022 10.1  4.0 - 10.5 K/uL Final   RBC 12/26/2022 5.71  4.22 - 5.81 Mil/uL Final   Platelets 12/26/2022 331.0  150.0 - 400.0 K/uL Final   Hemoglobin 12/26/2022 14.3  13.0 - 17.0 g/dL Final   HCT 98/83/7975 44.1  39.0 - 52.0 % Final   MCV 12/26/2022 77.2 (L)  78.0 - 100.0 fl Final   MCHC 12/26/2022 32.5  30.0 - 36.0 g/dL Final   RDW 98/83/7975 17.2 (H)  11.5 - 15.5 % Final   Sodium 12/26/2022 141  135 - 145 mEq/L Final   Potassium 12/26/2022 4.3  3.5 - 5.1 mEq/L Final   Chloride 12/26/2022 103  96 - 112 mEq/L Final   CO2 12/26/2022 29  19 - 32 mEq/L Final   Glucose, Bld 12/26/2022 103 (H)  70 - 99 mg/dL Final   BUN 98/83/7975 11  6 - 23 mg/dL Final   Creatinine, Ser 12/26/2022 1.17  0.40 - 1.50 mg/dL Final   Total Bilirubin 12/26/2022 0.5  0.2 - 1.2 mg/dL Final   Alkaline Phosphatase 12/26/2022 95  39 - 117 U/L Final   AST 12/26/2022 13  0 - 37 U/L Final   ALT 12/26/2022 12  0 -  53 U/L Final   Total Protein 12/26/2022 7.2  6.0 - 8.3 g/dL Final   Albumin 98/83/7975 4.0  3.5 - 5.2 g/dL Final   GFR 98/83/7975 67.96  >60.00 mL/min Final   Calcium  12/26/2022 9.5  8.4 - 10.5 mg/dL Final   Cholesterol 98/83/7975 139  0 - 200 mg/dL Final   Triglycerides 98/83/7975 74.0  0.0 - 149.0  mg/dL Final   HDL 98/83/7975 30.50 (L)  >60.99 mg/dL Final   VLDL 98/83/7975 14.8  0.0 - 40.0 mg/dL Final   LDL Cholesterol 12/26/2022 94  0 - 99 mg/dL Final   Total CHOL/HDL Ratio 12/26/2022 5   Final   NonHDL 12/26/2022 108.76   Final   MICRO NUMBER: 12/26/2022 [id number]   Final   SPECIMEN QUALITY: 12/26/2022 Adequate   Final   Sample Source 12/26/2022 URINE   Final   STATUS: 12/26/2022 FINAL   Final   ISOLATE 1: 12/26/2022 Klebsiella pneumoniae (A)   Final   Color, UA 12/26/2022 yellow   Final   Clarity, UA 12/26/2022 cloudy   Final   Glucose, UA 12/26/2022 Negative  Negative Final   Bilirubin, UA 12/26/2022 Negative   Final   Ketones, UA 12/26/2022 Negative   Final   Spec Grav, UA 12/26/2022 1.015  1.010 - 1.025 Final   Blood, UA 12/26/2022 Negative   Final   pH, UA 12/26/2022 5.0  5.0 - 8.0 Final   Protein, UA 12/26/2022 Negative  Negative Final   Urobilinogen, UA 12/26/2022 0.2  0.2 or 1.0 E.U./dL Final   Nitrite, UA 98/83/7975 Postive   Final   Leukocytes, UA 12/26/2022 Moderate (2+) (A)  Negative Final  No image results found. No results found.       ASSESSMENT & PLAN   Assessment & Plan Morbid obesity with BMI of 50.0-59.9, adult (HCC) He has lost 20 pounds over two months, now weighing 345 pounds, down from 361 pounds. This weight loss is attributed to Mounjaro  and an exercise regimen. He tolerates the current Mounjaro  dose of 15 mg well. Continue Mounjaro  15 mg weekly and encourage ongoing exercise. On statin therapy Muscle cramp, nocturnal He experiences muscle cramps, likely due to electrolyte imbalance from diuretics. The recent addition of hydrochlorothiazide  may contribute, and potential statin-related cramps are considered. Discontinue hydrochlorothiazide , order blood tests to check potassium, magnesium, and CK levels, and consider over-the-counter potassium or magnesium supplements if needed. Type 2 diabetes mellitus with other specified complication, without  long-term current use of insulin  (HCC) Blood sugar levels are well-managed without hypoglycemic episodes. Proteinuria likely improved with Jardiance . Kidney function is monitored due to nephropathy. Continue Jardiance  25 mg daily and monitor blood sugar levels regularly. Elevated PSA He has an elevated PSA with a recent urinary tract infection treated with sulfa  antibiotics. An MRI is scheduled to further evaluate the prostate, and he has anxiety about the MRI. Proceed with the scheduled MRI for prostate evaluation and continue sulfa  antibiotics as prescribed. Vitamin D  deficiency Vitamin D  levels have improved with supplementation and are currently satisfactory. Continue current vitamin D  supplementation. Stage 3a chronic kidney disease (HCC) He has stage 3a chronic kidney disease with proteinuria. Jardiance  likely reduces proteinuria. Blood pressure management is crucial to prevent further kidney damage. Continue Jardiance  25 mg daily, monitor kidney function and proteinuria, and adjust blood pressure medications as needed. Chronic diastolic heart failure (HCC) His symptoms have improved with weight loss. He reports no recent chest pain and improved breathing with increased exercise tolerance. Regular cardiology follow-up is recommended.  Continue current heart failure medications and encourage low-impact exercises like bicycling and swimming. Ensure regular follow-up with a cardiologist.  The importance of vaccinations, especially given heart failure, was discussed. Pneumonia and flu vaccinations are recommended. Advanced directives and financial planning were also discussed. Consider pneumonia and flu vaccinations and ensure advanced directives and financial planning are up to date.  ORDER ASSOCIATIONS  #   DIAGNOSIS / CONDITION ICD-10 ENCOUNTER ORDER     ICD-10-CM   1. Morbid obesity with BMI of 50.0-59.9, adult (HCC)  E66.01    Z68.43     2. On statin therapy  Z79.899 Basic metabolic panel     Magnesium    CK (Creatine Kinase)    3. Muscle cramp, nocturnal  R25.2 Basic metabolic panel    Magnesium    CK (Creatine Kinase)    4. Type 2 diabetes mellitus with other specified complication, without long-term current use of insulin  (HCC)  E11.69     5. Elevated PSA  R97.20     6. Vitamin D  deficiency  E55.9     7. Stage 3a chronic kidney disease (HCC)  N18.31 Microalbumin / creatinine urine ratio    8. Chronic diastolic heart failure (HCC)  P49.67            Orders Placed in Encounter:   Lab Orders         Basic metabolic panel         Magnesium         CK (Creatine Kinase)         Microalbumin / creatinine urine ratio     Imaging Orders  No imaging studies ordered today   Referral Orders  No referral(s) requested today   No orders of the defined types were placed in this encounter.   Orders Placed This Encounter  Procedures   Basic metabolic panel   Magnesium   CK (Creatine Kinase)   Microalbumin / creatinine urine ratio    [place]   ED Discharge Orders          Ordered    Basic metabolic panel        10/20/24 1134    Magnesium        10/20/24 1134    CK (Creatine Kinase)        10/20/24 1134    Microalbumin / creatinine urine ratio       Comments: Specimen [id number]: Carmi    10/20/24 1134              This document was synthesized by artificial intelligence ([employer]bridge) using HIPAA-compliant recording of the clinical interaction;   We discussed the use of AI scribe software for clinical note transcription with the patient, who gave verbal consent to proceed. additional Info: This encounter employed state-of-the-art, real-time, collaborative documentation. The patient actively reviewed and assisted in updating their electronic medical record on a shared screen, ensuring transparency and facilitating joint problem-solving for the problem list, overview, and plan. This approach promotes accurate, informed care. The treatment plan was discussed  and reviewed in detail, including medication safety, potential side effects, and all patient questions. We confirmed understanding and comfort with the plan. Follow-up instructions were established, including contacting the office for any concerns, returning if symptoms worsen, persist, or new symptoms develop, and precautions for potential emergency department visits.

## 2024-10-20 NOTE — Assessment & Plan Note (Signed)
 He has an elevated PSA with a recent urinary tract infection treated with sulfa  antibiotics. An MRI is scheduled to further evaluate the prostate, and he has anxiety about the MRI. Proceed with the scheduled MRI for prostate evaluation and continue sulfa  antibiotics as prescribed.

## 2024-10-20 NOTE — Assessment & Plan Note (Signed)
 Vitamin D  levels have improved with supplementation and are currently satisfactory. Continue current vitamin D  supplementation.

## 2024-10-20 NOTE — Assessment & Plan Note (Signed)
 He experiences muscle cramps, likely due to electrolyte imbalance from diuretics. The recent addition of hydrochlorothiazide  may contribute, and potential statin-related cramps are considered. Discontinue hydrochlorothiazide , order blood tests to check potassium, magnesium, and CK levels, and consider over-the-counter potassium or magnesium supplements if needed.

## 2024-10-20 NOTE — Assessment & Plan Note (Signed)
 His symptoms have improved with weight loss. He reports no recent chest pain and improved breathing with increased exercise tolerance. Regular cardiology follow-up is recommended. Continue current heart failure medications and encourage low-impact exercises like bicycling and swimming. Ensure regular follow-up with a cardiologist.

## 2024-10-20 NOTE — Assessment & Plan Note (Signed)
 He has stage 3a chronic kidney disease with proteinuria. Jardiance  likely reduces proteinuria. Blood pressure management is crucial to prevent further kidney damage. Continue Jardiance  25 mg daily, monitor kidney function and proteinuria, and adjust blood pressure medications as needed.

## 2024-10-20 NOTE — Assessment & Plan Note (Signed)
 Blood sugar levels are well-managed without hypoglycemic episodes. Proteinuria likely improved with Jardiance . Kidney function is monitored due to nephropathy. Continue Jardiance  25 mg daily and monitor blood sugar levels regularly.

## 2024-10-20 NOTE — Assessment & Plan Note (Signed)
 He has lost 20 pounds over two months, now weighing 345 pounds, down from 361 pounds. This weight loss is attributed to Mounjaro  and an exercise regimen. He tolerates the current Mounjaro  dose of 15 mg well. Continue Mounjaro  15 mg weekly and encourage ongoing exercise.

## 2024-10-20 NOTE — Patient Instructions (Addendum)
 It was a pleasure seeing you today! Your health and satisfaction are our top priorities.  Jason Cone, MD  VISIT SUMMARY: Today, we discussed your progress with weight loss and blood pressure management. You have successfully lost 20 pounds over the past two months, and your current medications are working well. We also reviewed your heart failure, diabetes, kidney disease, muscle cramps, elevated PSA, and vitamin D  levels. Additionally, we talked about the importance of vaccinations and advanced directives.  YOUR PLAN: -MORBID OBESITY: You have lost 20 pounds over the past two months, now weighing 345 pounds, down from 361 pounds. This weight loss is attributed to Mounjaro  and an exercise regimen. Continue taking Mounjaro  15 mg weekly and keep up with your exercise routine.  -CHRONIC DIASTOLIC HEART FAILURE: Your symptoms have improved with weight loss, and you report no recent chest pain and better breathing. Continue your current heart failure medications and engage in low-impact exercises like bicycling and swimming. Regular follow-up with a cardiologist is recommended.  -TYPE 2 DIABETES MELLITUS WITH NEPHROPATHY AND PROTEINURIA: Your blood sugar levels are well-managed, and proteinuria has likely improved with Jardiance . Continue taking Jardiance  25 mg daily and monitor your blood sugar levels regularly.  -CHRONIC KIDNEY DISEASE, STAGE 3A: You have stage 3a chronic kidney disease with proteinuria. Blood pressure management is crucial to prevent further kidney damage. Continue taking Jardiance  25 mg daily, monitor your kidney function and proteinuria, and adjust blood pressure medications as needed.  -MUSCLE CRAMPS AND SPASMS: You experience muscle cramps, likely due to an electrolyte imbalance from diuretics. Discontinue hydrochlorothiazide , and we will order blood tests to check your potassium, magnesium, and CK levels. Consider over-the-counter potassium or magnesium supplements if  needed.  -ELEVATED PROSTATE SPECIFIC ANTIGEN (PSA): You have an elevated PSA with a recent urinary tract infection treated with sulfa  antibiotics. An MRI is scheduled to further evaluate your prostate. Continue taking the sulfa  antibiotics as prescribed.  -VITAMIN D  DEFICIENCY: Your vitamin D  levels have improved with supplementation and are currently satisfactory. Continue your current vitamin D  supplementation.  -GENERAL HEALTH MAINTENANCE: We discussed the importance of vaccinations, especially given your heart failure. Pneumonia and flu vaccinations are recommended. We also talked about advanced directives and financial planning. Consider getting pneumonia and flu vaccinations and ensure your advanced directives and financial planning are up to date.  INSTRUCTIONS: Please schedule regular follow-ups with your cardiologist and proceed with the scheduled MRI for prostate evaluation. Continue monitoring your blood sugar levels and kidney function. Discontinue hydrochlorothiazide  and get the recommended blood tests for potassium, magnesium, and CK levels. Consider getting pneumonia and flu vaccinations.  Your Providers PCP: Meyer Jason MATSU, MD,  [phone]) Referring Provider: Cone Jason MATSU, MD,  [phone]) Care Team Provider: Debera Jayson MATSU, MD,  [phone]) Care Team Provider: Margrette Taft BRAVO, MD,  [phone])  NEXT STEPS: [x]  Early Intervention: Schedule sooner appointment, call our on-call services, or go to emergency room if there is any significant Increase in pain or discomfort New or worsening symptoms Sudden or severe changes in your health [x]  Flexible Follow-Up: We recommend a Return in about 3 months (around 01/20/2025). for optimal routine care. This allows for progress monitoring and treatment adjustments. [x]  Preventive Care: Schedule your annual preventive care visit! It's typically covered by insurance and helps identify potential health issues  early. [x]  Lab & X-ray Appointments: Incomplete tests scheduled today, or call to schedule. X-rays: [facility] (M-F, 8:30am-noon or 1pm-5pm). [x]  Medical Information Release: Sign a release form at  front desk to obtain relevant medical information we don't have.  MAKING THE MOST OF OUR FOCUSED 20 MINUTE APPOINTMENTS: [x]   Clearly state your top concerns at the beginning of the visit to focus our discussion [x]   If you anticipate you will need more time, please inform the front desk during scheduling - we can book multiple appointments in the same week. [x]   If you have transportation problems- use our convenient video appointments or ask about transportation support. [x]   We can get down to business faster if you use MyChart to update information before the visit and submit non-urgent questions before your visit. Thank you for taking the time to provide details through MyChart.  Let our nurse know and she can import this information into your encounter documents.  Arrival and Wait Times: [x]   Arriving on time ensures that everyone receives prompt attention. [x]   Early morning (8a) and afternoon (1p) appointments tend to have shortest wait times. [x]   Unfortunately, we cannot delay appointments for late arrivals or hold slots during phone calls.  Getting Answers and Following Up [x]   Simple Questions & Concerns: For quick questions or basic follow-up after your visit, reach us  at [phone] or MyChart messaging. [x]   Complex Concerns: If your concern is more complex, scheduling an appointment might be best. Discuss this with the staff to find the most suitable option. [x]   Lab & Imaging Results: We'll contact you directly if results are abnormal or you don't use MyChart. Most normal results will be on MyChart within 2-3 business days, with a review message from Dr. Jesus. Haven't heard back in 2 weeks? Need results sooner? Contact us  at [phone]. [x]   Referrals: Our  referral coordinator will manage specialist referrals. The specialist's office should contact you within 2 weeks to schedule an appointment. Call us  if you haven't heard from them after 2 weeks.  Staying Connected [x]   MyChart: Activate your MyChart for the fastest way to access results and message us . See the last page of this paperwork for instructions on how to activate.  Bring to Your Next Appointment [x]   Medications: Please bring all your medication bottles to your next appointment to ensure we have an accurate record of your prescriptions. [x]   Health Diaries: If you're monitoring any health conditions at home, keeping a diary of your readings can be very helpful for discussions at your next appointment.  Billing [x]   X-ray & Lab Orders: These are billed by separate companies. Contact the invoicing company directly for questions or concerns. [x]   Visit Charges: Discuss any billing inquiries with our administrative services team.  Your Satisfaction Matters [x]   Share Your Experience: We strive for your satisfaction! If you have any complaints, or preferably compliments, please let Dr. Jesus know directly or contact our Practice Administrators, Manuelita Rubin or [employer], by asking at the front desk.   Reviewing Your Records [x]   Review this early draft of your clinical encounter notes below and the final encounter summary tomorrow on MyChart after its been completed.  All orders placed so far are visible here: Morbid obesity with BMI of 50.0-59.9, adult (HCC)  On statin therapy -     Basic metabolic panel with GFR -     Magnesium -     CK  Muscle cramp, nocturnal -     Basic metabolic panel with GFR -     Magnesium -     CK  Type 2 diabetes mellitus with other specified complication, without long-term  current use of insulin  (HCC)  Elevated PSA  Vitamin D  deficiency  Stage 3a chronic kidney disease (HCC) -     Microalbumin / creatinine urine ratio  Chronic  diastolic heart failure (HCC)

## 2024-10-21 LAB — BASIC METABOLIC PANEL WITH GFR
BUN/Creatinine Ratio: 16 (calc) (ref 6–22)
BUN: 36 mg/dL — ABNORMAL HIGH (ref 7–25)
CO2: 26 mmol/L (ref 20–32)
Calcium: 10 mg/dL (ref 8.6–10.3)
Chloride: 105 mmol/L (ref 98–110)
Creat: 2.3 mg/dL — ABNORMAL HIGH (ref 0.70–1.35)
Glucose, Bld: 93 mg/dL (ref 65–99)
Potassium: 4.7 mmol/L (ref 3.5–5.3)
Sodium: 140 mmol/L (ref 135–146)
eGFR: 32 mL/min/1.73m2 — ABNORMAL LOW (ref >=60)

## 2024-10-26 ENCOUNTER — Ambulatory Visit: Payer: Self-pay | Admitting: Internal Medicine

## 2024-10-26 ENCOUNTER — Other Ambulatory Visit: Payer: Self-pay | Admitting: Internal Medicine

## 2024-10-26 DIAGNOSIS — N179 Acute kidney failure, unspecified: Secondary | ICD-10-CM

## 2024-10-26 DIAGNOSIS — N1832 Chronic kidney disease, stage 3b: Secondary | ICD-10-CM

## 2024-10-27 NOTE — Telephone Encounter (Signed)
 Called and spoke with pt advised to come in and make appt with provider in the next 3 to 4 weeks transferred up front to make appt per provider

## 2024-11-13 ENCOUNTER — Telehealth: Payer: Self-pay | Admitting: Internal Medicine

## 2024-11-13 NOTE — Telephone Encounter (Signed)
 [employer] and [employer] faxed Disability continuing claim form, to be filled out by provider. Patient requested to send it back via Fax within ASAP. Document is located in providers tray at front office.Please advise at [phone].

## 2024-11-14 ENCOUNTER — Telehealth: Payer: Self-pay

## 2024-11-14 NOTE — Telephone Encounter (Signed)
 Received placed on provider desk once MA fills out what she  can

## 2024-11-14 NOTE — Telephone Encounter (Signed)
 Faxed over disability paperwork.

## 2024-11-18 ENCOUNTER — Ambulatory Visit: Admitting: Internal Medicine

## 2024-11-25 ENCOUNTER — Other Ambulatory Visit: Payer: Self-pay | Admitting: Internal Medicine

## 2024-11-25 DIAGNOSIS — E1169 Type 2 diabetes mellitus with other specified complication: Secondary | ICD-10-CM

## 2024-12-16 ENCOUNTER — Ambulatory Visit: Admitting: Internal Medicine

## 2024-12-16 ENCOUNTER — Encounter: Payer: Self-pay | Admitting: Internal Medicine

## 2024-12-16 VITALS — BP 138/80 | HR 81 | Temp 98.0°F | Ht 71.0 in | Wt 348.2 lb

## 2024-12-16 DIAGNOSIS — R0609 Other forms of dyspnea: Secondary | ICD-10-CM

## 2024-12-16 DIAGNOSIS — I152 Hypertension secondary to endocrine disorders: Secondary | ICD-10-CM | POA: Diagnosis not present

## 2024-12-16 DIAGNOSIS — I2510 Atherosclerotic heart disease of native coronary artery without angina pectoris: Secondary | ICD-10-CM | POA: Diagnosis not present

## 2024-12-16 DIAGNOSIS — E559 Vitamin D deficiency, unspecified: Secondary | ICD-10-CM | POA: Diagnosis not present

## 2024-12-16 DIAGNOSIS — N1831 Chronic kidney disease, stage 3a: Secondary | ICD-10-CM

## 2024-12-16 DIAGNOSIS — E782 Mixed hyperlipidemia: Secondary | ICD-10-CM | POA: Diagnosis not present

## 2024-12-16 DIAGNOSIS — E1169 Type 2 diabetes mellitus with other specified complication: Secondary | ICD-10-CM | POA: Diagnosis not present

## 2024-12-16 DIAGNOSIS — Z7984 Long term (current) use of oral hypoglycemic drugs: Secondary | ICD-10-CM | POA: Diagnosis not present

## 2024-12-16 DIAGNOSIS — R52 Pain, unspecified: Secondary | ICD-10-CM

## 2024-12-16 DIAGNOSIS — M1990 Unspecified osteoarthritis, unspecified site: Secondary | ICD-10-CM

## 2024-12-16 DIAGNOSIS — E785 Hyperlipidemia, unspecified: Secondary | ICD-10-CM

## 2024-12-16 DIAGNOSIS — Z79899 Other long term (current) drug therapy: Secondary | ICD-10-CM | POA: Diagnosis not present

## 2024-12-16 DIAGNOSIS — I5032 Chronic diastolic (congestive) heart failure: Secondary | ICD-10-CM | POA: Diagnosis not present

## 2024-12-16 DIAGNOSIS — E1121 Type 2 diabetes mellitus with diabetic nephropathy: Secondary | ICD-10-CM

## 2024-12-16 MED ORDER — KERENDIA 20 MG PO TABS: 20.0000 mg | ORAL_TABLET | Freq: Every day | ORAL | 1 refills | Status: AC

## 2024-12-16 MED ORDER — AMLODIPINE-OLMESARTAN 10-40 MG PO TABS: 1.0000 | ORAL_TABLET | Freq: Every day | ORAL | 3 refills | Status: AC

## 2024-12-16 MED ORDER — EMPAGLIFLOZIN 25 MG PO TABS: 25.0000 mg | ORAL_TABLET | Freq: Every day | ORAL | 3 refills | Status: AC

## 2024-12-16 MED ORDER — VITAMIN D3 125 MCG (5000 UT) PO CAPS: 5000.0000 [IU] | ORAL_CAPSULE | Freq: Every day | ORAL | 4 refills | Status: AC

## 2024-12-16 MED ORDER — ROSUVASTATIN CALCIUM 40 MG PO TABS: 40.0000 mg | ORAL_TABLET | Freq: Every day | ORAL | 3 refills | Status: AC

## 2024-12-16 MED ORDER — REPATHA SURECLICK 140 MG/ML ~~LOC~~ SOAJ: 140.0000 mg | SUBCUTANEOUS | 2 refills | Status: AC

## 2024-12-16 NOTE — Assessment & Plan Note (Signed)
 Looking over his A1c he is hardly diabetic and so I doubt that the proteinuria is coming from the diabetes per se but likely multifactorial his insurance is paying for Jardiance  but not kerendia  and we will reassess proteinuria today

## 2024-12-16 NOTE — Assessment & Plan Note (Signed)
 Continue with compression stockings and diuretics with as needed based navigation this is stable

## 2024-12-16 NOTE — Assessment & Plan Note (Signed)
 He assures me he is taking vitamin D  once the 5000 mg capsules and insurance is trying to force the 50,000 I will prescribe the 5000 as per patient preference

## 2024-12-16 NOTE — Assessment & Plan Note (Signed)
 Insurance denied Repatha  but he is not at goal for his coronary artery disease with diabetes and hyperlipidemia even though he is taking max dose statin

## 2024-12-16 NOTE — Assessment & Plan Note (Signed)
 Severe joint pain secondary to his weight and he has been taking Celebrex  despite the kidney risks we will try to get a pain specialist to help out he is already following with Ortho as well

## 2024-12-16 NOTE — Assessment & Plan Note (Signed)
 This is my biggest concern it is probably multifactorial cardiorenal hypertensive diabetic and possibly other factors.  Will check blood work monitor closely and he has not had an ultrasound so we will get that if this continues to progress we will get kidney specialist involved

## 2024-12-16 NOTE — Assessment & Plan Note (Signed)
 Reviewed available data from patient and  BP Readings from Last 3 Encounters:  12/16/24 138/80  10/20/24 110/62  09/09/24 126/72   My individualized, goal average blood pressure for this patient, after considering the evidence for and against aggressive blood pressure goals as well as their past medical history and preferences, is 140/90 In my medical opinion, this problem is stable, marginally controlled  Increased medication(s) prescribed after collaborative review. Following informed consent, we adjusted the medication regimen as per documented orders to now be:  Current hypertension medications:       Sig   amLODipine -olmesartan  (AZOR ) 10-40 MG tablet Take 1 tablet by mouth daily. Replaces amlodipine  and olmesartan    bisoprolol  (ZEBETA ) 10 MG tablet Take 1 tablet (10 mg total) by mouth daily.   furosemide  (LASIX ) 40 MG tablet Take 2 tablets (80 mg total) by mouth daily. Must take with spironolactone  to keep potassium up Reduce dose to just 40 mg daily if no fluid is accumulating.   spironolactone  (ALDACTONE ) 50 MG tablet Take 1 tablet (50 mg total) by mouth daily. Replaces 25 mg dose     This looks unchanged but in actuality he was only taking half of the Azor  when he came today and he will take a full tablet going forward

## 2024-12-16 NOTE — Assessment & Plan Note (Signed)
 He will continue with Mounjaro  15 mg and metformin  Diabetes is currently well controlled, based on available Hemoglobin A1c and glucose monitoring data listed in problem overview.  Diabetic education: ongoing education regarding chronic disease management for diabetes was given today. We continue to reinforce the ABC's of diabetic management: A1c (<7 or 8 dependent upon patient), tight blood pressure control, and cholesterol management with goal LDL < 100 minimally. We discuss diet strategies, exercise recommendations, medication options and possible side effects. At each visit, we review recommended immunizations and preventive care recommendations for diabetics and stress that good diabetic control can prevent other problems.  Importance of regular foot checks and yearly eye exams has been reinforced and is included here for a reminder  Diabetic Medications-Plan for today Diabetic Medications as of 12/16/2024           empagliflozin  (JARDIANCE ) 25 MG TABS tablet Take 1 tablet (25 mg total) by mouth daily before breakfast.   metFORMIN  (GLUCOPHAGE ) 1000 MG tablet TAKE 1 TABLET (1,000 MG TOTAL) BY MOUTH TWICE A DAY WITH FOOD   tirzepatide  (MOUNJARO ) 15 MG/0.5ML Pen Inject 15 mg into the skin once a week. Replaces 12.5 mg dose      Z79.84-long term or current use of oral hypoglycemic drugs  and Z79.85-long-term or current use of injectable non-insulin  antidiabetic drugs

## 2024-12-16 NOTE — Progress Notes (Signed)
 ==============================  [facility]: [phone]   -- Medical Office Visit --  Patient: Jason Meyer      Age: 65 y.o.       Sex:  male  Date:   12/16/2024 Today's Healthcare Provider: Bernardino KANDICE Cone, MD  ==============================   Chief Complaint: Discuss labs  (Follow up on labs from November ) Chronic disease monitoring   Discussed the use of AI scribe software for clinical note transcription with the patient, who gave verbal consent to proceed.  History of Present Illness This is a 64 year old male who is presenting for chronic disease management follow-up multiple complex chronic medical conditions including diabetes hypertension morbid obesity chronic diastolic heart failure with coronary artery disease elevated PSA and chronic kidney disease from unclear source.  He does have diabetes with proteinuria suspicious as a cause but blood pressure has been mostly stable so he is cut back on the amlodipine  Azor  combo pill to half because his blood pressure at the last visit was low but it is high today.  He does not monitor his home sugars or blood pressures.  He reports his swelling from the heart failure comes and goes and that he wears compression stockings and takes his fluid pills.  He reports he has seen a urologist since our last visit and that he is being planned for an open MRI of the prostate for his elevated PSA.  Lab Results  Component Value Date   HGBA1C 6.7 (H) 07/17/2024   HGBA1C 6.5 04/14/2024   HGBA1C 6.3 01/14/2024    BP Readings from Last 30 Encounters:  12/16/24 138/80  10/20/24 110/62  09/09/24 126/72  08/19/24 136/78  07/17/24 (!) 140/78  05/15/24 (!) 140/80  05/09/24 (!) 160/90  04/14/24 (!) 150/88  01/14/24 136/86  11/14/23 134/73  10/05/23 (!) 146/82  09/07/23 116/73  05/08/23 122/73  04/25/23 133/67  04/17/23 (!) 115/48  02/06/23 130/78  12/26/22 114/68  10/19/22 (!) 152/75  09/14/22  (!) 144/77  08/11/22 134/70  08/01/22 132/72  07/14/22 131/78  04/27/22 (!) 149/81  03/29/22 137/78  02/22/22 (!) 146/81  02/11/22 105/66  12/22/21 (!) 163/85  11/24/21 (!) 150/83  11/18/21 (!) 157/80  11/17/21 (!) 164/78    Lab Results  Component Value Date   PSA1 8.9 (H) 12/22/2021   PSA 11.46 (H) 04/14/2024   PSA 11.53 (H) 05/08/2023   PSA 2.2 07/06/2014  Following with urologist    Background Reviewed: Problem List: has Type 2 diabetes mellitus with other specified complication (HCC); Hypertension; Morbid obesity with BMI of 50.0-59.9, adult (HCC); Mixed hyperlipidemia; Venous stasis; OA (osteoarthritis); DOE (dyspnea on exertion); Chronic diastolic heart failure (HCC); Venous stasis dermatitis of both lower extremities; OSA (obstructive sleep apnea); Erectile dysfunction; Low HDL (under 40); Elevated PSA; Coronary artery calcification; Coronary artery disease; On statin therapy; Chronic kidney disease, stage 3 unspecified (HCC); Vitamin D  deficiency; Vision problems; Encounter for disability examination; 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 Past Medical History:  has a past medical history of Allergy, Arthritis, CAP (community acquired pneumonia) (04/12/2023), Chronic diastolic heart failure (HCC) (90/98/7976), Coronary artery calcification (10/19/2022), Hyperlipidemia, Hypertension, OSA (obstructive sleep apnea) (10/19/2022), Sepsis (HCC) (04/12/2023), Sleep apnea, and Type 2 diabetes mellitus (HCC). Past Surgical History:   has a past surgical history that includes No past surgeries. Social History:   reports that he quit smoking about 17 years ago.  His smoking use included cigarettes. He started smoking about 50 years ago. He has a 33 pack-year smoking history. He has never used smokeless tobacco. He reports that he does not currently use alcohol.  He reports that he does not use drugs. Family History:  family history includes Alzheimer's disease in his mother; Heart attack in his brother and father; Heart disease in his father; Hypertension in his father; Stroke in his father. Allergies:  is allergic to penicillins.   Medication Reconciliation: Current Outpatient Medications on File Prior to Visit  Medication Sig   acetaminophen  (TYLENOL ) 325 MG tablet Take 2 tablets (650 mg total) by mouth every 6 (six) hours as needed for mild pain (or Fever >/= 101).   albuterol  (PROVENTIL ) (2.5 MG/3ML) 0.083% nebulizer solution Take 3 mLs (2.5 mg total) by nebulization every 6 (six) hours as needed for wheezing or shortness of breath.   albuterol  (VENTOLIN  HFA) 108 (90 Base) MCG/ACT inhaler Inhale 2 puffs into the lungs every 6 (six) hours as needed for wheezing or shortness of breath.   ASPIRIN  EC 81 MG EC tablet Take 1 tablet (81 mg total) by mouth daily.   Azelastine  HCl 137 MCG/SPRAY SOLN Place 1 spray into the nose daily.   bisoprolol  (ZEBETA ) 10 MG tablet Take 1 tablet (10 mg total) by mouth daily.   buPROPion  (WELLBUTRIN  XL) 150 MG 24 hr tablet TAKE 1 TABLET BY MOUTH EVERY DAY   celecoxib  (CELEBREX ) 200 MG capsule TAKE 1 CAPSULE (200 MG TOTAL) BY MOUTH 2 (TWO) TIMES DAILY. REPLACES MELOXICAM    clotrimazole  (LOTRIMIN ) 1 % cream Apply topically daily.   clotrimazole -betamethasone  (LOTRISONE ) cream APPLY TO AFFECTED AREA TWICE A DAY   ezetimibe  (ZETIA ) 10 MG tablet Take 1 tablet (10 mg total) by mouth daily.   famotidine -calcium  carbonate-magnesium hydroxide (PEPCID  COMPLETE) 10-800-165 MG chewable tablet Chew 1 tablet by mouth 2 (two) times daily as needed.   fluticasone  (FLONASE ) 50 MCG/ACT nasal spray Place 2 sprays into both nostrils daily.   fluticasone  furoate-vilanterol (BREO ELLIPTA ) 100-25 MCG/ACT AEPB Inhale 1 puff into the lungs daily.   furosemide  (LASIX ) 40 MG tablet Take 2 tablets (80 mg total) by mouth daily. Must take with  spironolactone  to keep potassium up Reduce dose to just 40 mg daily if no fluid is accumulating.   metFORMIN  (GLUCOPHAGE ) 1000 MG tablet TAKE 1 TABLET (1,000 MG TOTAL) BY MOUTH TWICE A DAY WITH FOOD   potassium chloride  (MICRO-K ) 10 MEQ CR capsule TAKE 1 CAPSULE BY MOUTH EVERY DAY   spironolactone  (ALDACTONE ) 50 MG tablet Take 1 tablet (50 mg total) by mouth daily. Replaces 25 mg dose   sulfamethoxazole -trimethoprim  (BACTRIM  DS) 800-160 MG tablet Take 1 tablet by mouth 2 (two) times daily.   tirzepatide  (MOUNJARO ) 15 MG/0.5ML Pen Inject 15 mg into the skin once a week. Replaces 12.5 mg dose   No current facility-administered medications on file prior to visit.   Medications Discontinued During This Encounter  Medication Reason   Evolocumab  (REPATHA  SURECLICK) 140 MG/ML SOAJ Reorder   rosuvastatin  (CRESTOR ) 40 MG tablet Reorder   amLODipine -olmesartan  (AZOR ) 10-40 MG tablet Reorder   empagliflozin  (JARDIANCE ) 25 MG TABS tablet Reorder   Finerenone  (KERENDIA ) 20 MG TABS Reorder   Cholecalciferol (VITAMIN D3) 125 MCG (5000 UT) CAPS Reorder     Physical Exam:    12/16/2024   10:31 AM 10/20/2024   10:37 AM 09/09/2024    9:57 AM  Vitals with BMI  Height 5' 11 5' 11 5' 11  Weight 348  lbs 3 oz 345 lbs 10 oz 359 lbs 3 oz  BMI 48.59 48.22 50.12  Systolic 138 110 873  Diastolic 80 62 72  Pulse 81 66 60  Vital signs reviewed.  Nursing notes reviewed. Weight trend reviewed. Physical Activity: Not on file   General Appearance:  No acute distress appreciable.   Well-groomed, healthy-appearing male.  Well proportioned with no abnormal fat distribution.  Good muscle tone. Pulmonary:  Normal work of breathing at rest, no respiratory distress apparent. SpO2: 98 %  Musculoskeletal: All extremities are intact.  Neurological:  Awake, alert, oriented, and engaged.  No obvious focal neurological deficits or cognitive impairments.  Sensorium seems unclouded.   Speech is clear and coherent with logical  content. Psychiatric:  Appropriate mood, pleasant and cooperative demeanor, thoughtful and engaged during the exam  Verbalized to patient: Physical Exam Minimal leg swelling compared to usual for him     07/28/2024    9:42 AM 05/15/2024   10:34 AM 05/09/2024   10:37 AM 01/14/2024    2:16 PM  PHQ 2/9 Scores  PHQ - 2 Score 0 0 0 0  PHQ- 9 Score  0  0       Data saved with a previous flowsheet row definition    Lab Results  Component Value Date   GFR 54.66 (L) 07/17/2024   GFR 55.24 (L) 04/14/2024   GFR 57.84 (L) 01/14/2024   GFR 71.43 05/08/2023   GFR 67.96 12/26/2022   Lab Results  Component Value Date   EGFR 32 (L) 10/20/2024   EGFR 68 03/29/2022   EGFR 75 02/22/2022   EGFR 73 12/22/2021    Office Visit on 10/20/2024  Component Date Value Ref Range Status   Glucose, Bld 10/20/2024 93  65 - 99 mg/dL Final   BUN 88/89/7974 36 (H)  7 - 25 mg/dL Final   Creat 88/89/7974 2.30 (H)  0.70 - 1.35 mg/dL Final   eGFR 88/89/7974 32 (L)  > OR = 60 mL/min/1.47m2 Final   BUN/Creatinine Ratio 10/20/2024 16  6 - 22 (calc) Final   Sodium 10/20/2024 140  135 - 146 mmol/L Final   Potassium 10/20/2024 4.7  3.5 - 5.3 mmol/L Final   Chloride 10/20/2024 105  98 - 110 mmol/L Final   CO2 10/20/2024 26  20 - 32 mmol/L Final   Calcium  10/20/2024 10.0  8.6 - 10.3 mg/dL Final   Magnesium 88/89/7974 2.1  1.5 - 2.5 mg/dL Final   Total CK 88/89/7974 108  17 - 232 U/L Final   Microalb, Ur 10/20/2024 12.6 (H)  0.0 - 1.9 mg/dL Final   Creatinine,U 88/89/7974 246.4  mg/dL Final   Microalb Creat Ratio 10/20/2024 51.3 (H)  0.0 - 30.0 mg/g Final  Office Visit on 07/17/2024  Component Date Value Ref Range Status   WBC 07/17/2024 8.7  4.0 - 10.5 K/uL Final   RBC 07/17/2024 6.24 (H)  4.22 - 5.81 Mil/uL Final   Hemoglobin 07/17/2024 15.9  13.0 - 17.0 g/dL Final   HCT 91/92/7974 49.7  39.0 - 52.0 % Final   MCV 07/17/2024 79.6  78.0 - 100.0 fl Final   MCHC 07/17/2024 32.1  30.0 - 36.0 g/dL Final   RDW  91/92/7974 17.9 (H)  11.5 - 15.5 % Final   Platelets 07/17/2024 245.0  150.0 - 400.0 K/uL Final   Neutrophils Relative % 07/17/2024 72.6  43.0 - 77.0 % Final   Lymphocytes Relative 07/17/2024 18.5  12.0 - 46.0 % Final   Monocytes  Relative 07/17/2024 7.1  3.0 - 12.0 % Final   Eosinophils Relative 07/17/2024 1.3  0.0 - 5.0 % Final   Basophils Relative 07/17/2024 0.5  0.0 - 3.0 % Final   Neutro Abs 07/17/2024 6.3  1.4 - 7.7 K/uL Final   Lymphs Abs 07/17/2024 1.6  0.7 - 4.0 K/uL Final   Monocytes Absolute 07/17/2024 0.6  0.1 - 1.0 K/uL Final   Eosinophils Absolute 07/17/2024 0.1  0.0 - 0.7 K/uL Final   Basophils Absolute 07/17/2024 0.0  0.0 - 0.1 K/uL Final   Sodium 07/17/2024 139  135 - 145 mEq/L Final   Potassium 07/17/2024 4.8  3.5 - 5.1 mEq/L Final   Chloride 07/17/2024 102  96 - 112 mEq/L Final   CO2 07/17/2024 25  19 - 32 mEq/L Final   Glucose, Bld 07/17/2024 85  70 - 99 mg/dL Final   BUN 91/92/7974 16  6 - 23 mg/dL Final   Creatinine, Ser 07/17/2024 1.39  0.40 - 1.50 mg/dL Final   Total Bilirubin 07/17/2024 0.5  0.2 - 1.2 mg/dL Final   Alkaline Phosphatase 07/17/2024 67  39 - 117 U/L Final   AST 07/17/2024 17  0 - 37 U/L Final   ALT 07/17/2024 19  0 - 53 U/L Final   Total Protein 07/17/2024 7.4  6.0 - 8.3 g/dL Final   Albumin 91/92/7974 4.2  3.5 - 5.2 g/dL Final   GFR 91/92/7974 54.66 (L)  >60.00 mL/min Final   Calcium  07/17/2024 10.0  8.4 - 10.5 mg/dL Final   Cholesterol 91/92/7974 136  0 - 200 mg/dL Final   Triglycerides 91/92/7974 86.0  0.0 - 149.0 mg/dL Final   HDL 91/92/7974 42.80  >39.00 mg/dL Final   VLDL 91/92/7974 17.2  0.0 - 40.0 mg/dL Final   LDL Cholesterol 07/17/2024 76  0 - 99 mg/dL Final   Total CHOL/HDL Ratio 07/17/2024 3   Final   NonHDL 07/17/2024 93.49   Final   Hgb A1c MFr Bld 07/17/2024 6.7 (H)  4.6 - 6.5 % Final   Microalb, Ur 07/17/2024 3.8 (H)  0.0 - 1.9 mg/dL Final   Creatinine,U 91/92/7974 176.2  mg/dL Final   Microalb Creat Ratio 07/17/2024 21.7  0.0  - 30.0 mg/g Final   VITD 07/17/2024 59.82  30.00 - 100.00 ng/mL Final  Office Visit on 04/14/2024  Component Date Value Ref Range Status   Sodium 04/14/2024 139  135 - 145 mEq/L Final   Potassium 04/14/2024 4.2  3.5 - 5.1 mEq/L Final   Chloride 04/14/2024 105  96 - 112 mEq/L Final   CO2 04/14/2024 25  19 - 32 mEq/L Final   Glucose, Bld 04/14/2024 110 (H)  70 - 99 mg/dL Final   BUN 94/94/7974 13  6 - 23 mg/dL Final   Creatinine, Ser 04/14/2024 1.38  0.40 - 1.50 mg/dL Final   Total Bilirubin 04/14/2024 0.6  0.2 - 1.2 mg/dL Final   Alkaline Phosphatase 04/14/2024 63  39 - 117 U/L Final   AST 04/14/2024 15  0 - 37 U/L Final   ALT 04/14/2024 15  0 - 53 U/L Final   Total Protein 04/14/2024 6.5  6.0 - 8.3 g/dL Final   Albumin 94/94/7974 4.0  3.5 - 5.2 g/dL Final   GFR 94/94/7974 55.24 (L)  >60.00 mL/min Final   Calcium  04/14/2024 9.0  8.4 - 10.5 mg/dL Final   VITD 94/94/7974 23.85 (L)  30.00 - 100.00 ng/mL Final   Creatinine, Urine 04/14/2024 156  20 - 320 mg/dL  Final   Protein/Creat Ratio 04/14/2024 545 (H)  25 - 148 mg/g creat Final   Protein/Creatinine Ratio 04/14/2024 0.545 (H)  0.025 - 0.148 mg/mg creat Final   Total Protein, Urine 04/14/2024 85 (H)  5 - 25 mg/dL Final   PSA 94/94/7974 11.46 (H)  0.10 - 4.00 ng/mL Final   Hgb A1c MFr Bld 04/14/2024 6.5  4.6 - 6.5 % Final   WBC 04/14/2024 8.5  4.0 - 10.5 K/uL Final   RBC 04/14/2024 6.00 (H)  4.22 - 5.81 Mil/uL Final   Hemoglobin 04/14/2024 15.6  13.0 - 17.0 g/dL Final   HCT 94/94/7974 47.7  39.0 - 52.0 % Final   MCV 04/14/2024 79.5  78.0 - 100.0 fl Final   MCHC 04/14/2024 32.8  30.0 - 36.0 g/dL Final   RDW 94/94/7974 16.3 (H)  11.5 - 15.5 % Final   Platelets 04/14/2024 175.0  150.0 - 400.0 K/uL Final   Neutrophils Relative % 04/14/2024 72.9  43.0 - 77.0 % Final   Lymphocytes Relative 04/14/2024 17.2  12.0 - 46.0 % Final   Monocytes Relative 04/14/2024 6.2  3.0 - 12.0 % Final   Eosinophils Relative 04/14/2024 3.1  0.0 - 5.0 % Final    Basophils Relative 04/14/2024 0.6  0.0 - 3.0 % Final   Neutro Abs 04/14/2024 6.2  1.4 - 7.7 K/uL Final   Lymphs Abs 04/14/2024 1.5  0.7 - 4.0 K/uL Final   Monocytes Absolute 04/14/2024 0.5  0.1 - 1.0 K/uL Final   Eosinophils Absolute 04/14/2024 0.3  0.0 - 0.7 K/uL Final   Basophils Absolute 04/14/2024 0.1  0.0 - 0.1 K/uL Final  Office Visit on 01/14/2024  Component Date Value Ref Range Status   WBC 01/14/2024 9.1  4.0 - 10.5 K/uL Final   RBC 01/14/2024 5.99 (H)  4.22 - 5.81 Mil/uL Final   Hemoglobin 01/14/2024 15.4  13.0 - 17.0 g/dL Final   HCT 97/96/7974 47.8  39.0 - 52.0 % Final   MCV 01/14/2024 79.7  78.0 - 100.0 fl Final   MCHC 01/14/2024 32.2  30.0 - 36.0 g/dL Final   RDW 97/96/7974 17.8 (H)  11.5 - 15.5 % Final   Platelets 01/14/2024 220.0  150.0 - 400.0 K/uL Final   Neutrophils Relative % 01/14/2024 74.3  43.0 - 77.0 % Final   Lymphocytes Relative 01/14/2024 18.1  12.0 - 46.0 % Final   Monocytes Relative 01/14/2024 4.8  3.0 - 12.0 % Final   Eosinophils Relative 01/14/2024 1.9  0.0 - 5.0 % Final   Basophils Relative 01/14/2024 0.9  0.0 - 3.0 % Final   Neutro Abs 01/14/2024 6.7  1.4 - 7.7 K/uL Final   Lymphs Abs 01/14/2024 1.6  0.7 - 4.0 K/uL Final   Monocytes Absolute 01/14/2024 0.4  0.1 - 1.0 K/uL Final   Eosinophils Absolute 01/14/2024 0.2  0.0 - 0.7 K/uL Final   Basophils Absolute 01/14/2024 0.1  0.0 - 0.1 K/uL Final   Sodium 01/14/2024 140  135 - 145 mEq/L Final   Potassium 01/14/2024 4.3  3.5 - 5.1 mEq/L Final   Chloride 01/14/2024 103  96 - 112 mEq/L Final   CO2 01/14/2024 28  19 - 32 mEq/L Final   Glucose, Bld 01/14/2024 89  70 - 99 mg/dL Final   BUN 97/96/7974 14  6 - 23 mg/dL Final   Creatinine, Ser 01/14/2024 1.33  0.40 - 1.50 mg/dL Final   Total Bilirubin 01/14/2024 0.5  0.2 - 1.2 mg/dL Final   Alkaline Phosphatase 01/14/2024  67  39 - 117 U/L Final   AST 01/14/2024 16  0 - 37 U/L Final   ALT 01/14/2024 15  0 - 53 U/L Final   Total Protein 01/14/2024 7.1  6.0 -  8.3 g/dL Final   Albumin 97/96/7974 4.1  3.5 - 5.2 g/dL Final   GFR 97/96/7974 57.84 (L)  >60.00 mL/min Final   Calcium  01/14/2024 9.3  8.4 - 10.5 mg/dL Final   Cholesterol 97/96/7974 125  0 - 200 mg/dL Final   Triglycerides 97/96/7974 93.0  0.0 - 149.0 mg/dL Final   HDL 97/96/7974 40.10  >39.00 mg/dL Final   VLDL 97/96/7974 18.6  0.0 - 40.0 mg/dL Final   LDL Cholesterol 01/14/2024 66  0 - 99 mg/dL Final   Total CHOL/HDL Ratio 01/14/2024 3   Final   NonHDL 01/14/2024 84.77   Final   Hgb A1c MFr Bld 01/14/2024 6.3  4.6 - 6.5 % Final   Interpretation - HGBRFX 01/14/2024 see note   Final   Reviewed By: 01/14/2024 see note   Final   Hgb A 01/14/2024 97.5  >96.0 % Final   Fetal Hemoglobin Testing 01/14/2024 0.0  <2.0 % Final   Hemoglobin A2 - HGBRFX 01/14/2024 2.5  2.0 - 3.2 % Final   C-Z Electrophoresis 01/14/2024 Confirms   Final   RBC 01/14/2024 6.15 (H)  4.20 - 5.80 Mill/uL Final   Hemoglobin 01/14/2024 15.5  13.2 - 17.1 g/dL Final   HCT 97/96/7974 50.2 (H)  38.5 - 50.0 % Final   MCV 01/14/2024 81.6  80.0 - 100.0 fL Final   MCH 01/14/2024 25.2 (L)  27.0 - 33.0 pg Final   MCHC 01/14/2024 30.9 (L)  32.0 - 36.0 g/dL Final   RDW 97/96/7974 17.3 (H)  11.0 - 15.0 % Final   Ferritin 01/14/2024 55  24 - 380 ng/mL Final   VITD 01/14/2024 32.30  30.00 - 100.00 ng/mL Final   PTH 01/14/2024 35  16 - 77 pg/mL Final   PSA, Total 01/14/2024 11.7 (H)  < OR = 4.0 ng/mL Final   PSA, Free 01/14/2024 1.0  ng/mL Final   PSA, % Free 01/14/2024 NOT CALCULATED  >25 % (calc) Final   Magnesium 01/14/2024 1.7  1.5 - 2.5 mg/dL Final   Uric Acid, Serum 01/14/2024 5.4  4.0 - 7.8 mg/dL Final   Ferritin 97/96/7974 44.0  22.0 - 322.0 ng/mL Final   Vitamin B-12 01/14/2024 212  211 - 911 pg/mL Final   Summary Report 01/14/2024 see note   Final   Reviewed By: 01/14/2024 see note   Final   Results Recieved 01/14/2024 01/25/24   Final   Alpha-Thalassemia 01/14/2024 see note   Final  Office Visit on 10/05/2023   Component Date Value Ref Range Status   Vit D, 25-Hydroxy 10/05/2023 10 (L)  30 - 100 ng/mL Final   Uric Acid, Serum 10/05/2023 5.4  4.0 - 8.0 mg/dL Final   PTH 89/74/7975 13 (L)  16 - 77 pg/mL Final   Phosphorus 10/05/2023 3.2  2.5 - 4.5 mg/dL Final   Magnesium 89/74/7975 1.9  1.5 - 2.5 mg/dL Final   Glucose, Bld 89/74/7975 85  65 - 99 mg/dL Final   BUN 89/74/7975 14  7 - 25 mg/dL Final   Creat 89/74/7975 1.22  0.70 - 1.35 mg/dL Final   BUN/Creatinine Ratio 10/05/2023 SEE NOTE:  6 - 22 (calc) Final   Sodium 10/05/2023 141  135 - 146 mmol/L Final   Potassium 10/05/2023 4.6  3.5 -  5.3 mmol/L Final   Chloride 10/05/2023 102  98 - 110 mmol/L Final   CO2 10/05/2023 28  20 - 32 mmol/L Final   Calcium  10/05/2023 9.8  8.6 - 10.3 mg/dL Final  Office Visit on 09/07/2023  Component Date Value Ref Range Status   WBC 09/07/2023 9.5  3.8 - 10.8 Thousand/uL Final   RBC 09/07/2023 6.28 (H)  4.20 - 5.80 Million/uL Final   Hemoglobin 09/07/2023 15.6  13.2 - 17.1 g/dL Final   HCT 90/72/7975 49.5  38.5 - 50.0 % Final   MCV 09/07/2023 78.8 (L)  80.0 - 100.0 fL Final   MCH 09/07/2023 24.8 (L)  27.0 - 33.0 pg Final   MCHC 09/07/2023 31.5 (L)  32.0 - 36.0 g/dL Final   RDW 90/72/7975 14.8  11.0 - 15.0 % Final   Platelets 09/07/2023 270  140 - 400 Thousand/uL Final   MPV 09/07/2023 10.8  7.5 - 12.5 fL Final   Neutro Abs 09/07/2023 6,603  1,500 - 7,800 cells/uL Final   Lymphs Abs 09/07/2023 1,957  850 - 3,900 cells/uL Final   Absolute Monocytes 09/07/2023 751  200 - 950 cells/uL Final   Eosinophils Absolute 09/07/2023 133  15 - 500 cells/uL Final   Basophils Absolute 09/07/2023 57  0 - 200 cells/uL Final   Neutrophils Relative % 09/07/2023 69.5  % Final   Total Lymphocyte 09/07/2023 20.6  % Final   Monocytes Relative 09/07/2023 7.9  % Final   Eosinophils Relative 09/07/2023 1.4  % Final   Basophils Relative 09/07/2023 0.6  % Final   Glucose, Bld 09/07/2023 78  65 - 99 mg/dL Final   BUN 90/72/7975 14  7  - 25 mg/dL Final   Creat 90/72/7975 1.24  0.70 - 1.35 mg/dL Final   BUN/Creatinine Ratio 09/07/2023 SEE NOTE:  6 - 22 (calc) Final   Sodium 09/07/2023 140  135 - 146 mmol/L Final   Potassium 09/07/2023 4.0  3.5 - 5.3 mmol/L Final   Chloride 09/07/2023 106  98 - 110 mmol/L Final   CO2 09/07/2023 22  20 - 32 mmol/L Final   Calcium  09/07/2023 9.2  8.6 - 10.3 mg/dL Final   Total Protein 90/72/7975 6.7  6.1 - 8.1 g/dL Final   Albumin 90/72/7975 4.1  3.6 - 5.1 g/dL Final   Globulin 90/72/7975 2.6  1.9 - 3.7 g/dL (calc) Final   AG Ratio 09/07/2023 1.6  1.0 - 2.5 (calc) Final   Total Bilirubin 09/07/2023 0.4  0.2 - 1.2 mg/dL Final   Alkaline phosphatase (APISO) 09/07/2023 75  35 - 144 U/L Final   AST 09/07/2023 21  10 - 35 U/L Final   ALT 09/07/2023 24  9 - 46 U/L Final   Cholesterol 09/07/2023 113  <200 mg/dL Final   HDL 90/72/7975 37 (L)  > OR = 40 mg/dL Final   Triglycerides 90/72/7975 73  <150 mg/dL Final   LDL Cholesterol (Calc) 09/07/2023 61  mg/dL (calc) Final   Total CHOL/HDL Ratio 09/07/2023 3.1  <4.9 (calc) Final   Non-HDL Cholesterol (Calc) 09/07/2023 76  <130 mg/dL (calc) Final   Hgb J8r MFr Bld 09/07/2023 6.7 (H)  <5.7 % of total Hgb Final   Mean Plasma Glucose 09/07/2023 146  mg/dL Final   eAG (mmol/L) 90/72/7975 8.1  mmol/L Final   Creatinine, Urine 09/07/2023 218  20 - 320 mg/dL Final   Microalb, Ur 90/72/7975 8.5  mg/dL Final   Microalb Creat Ratio 09/07/2023 39 (H)  <30 mg/g creat Final  Office Visit on 05/08/2023  Component Date Value Ref Range Status   PSA 05/08/2023 11.53 (H)  0.10 - 4.00 ng/mL Final   Sodium 05/08/2023 139  135 - 145 mEq/L Final   Potassium 05/08/2023 3.9  3.5 - 5.1 mEq/L Final   Chloride 05/08/2023 105  96 - 112 mEq/L Final   CO2 05/08/2023 24  19 - 32 mEq/L Final   Glucose, Bld 05/08/2023 140 (H)  70 - 99 mg/dL Final   BUN 94/71/7975 13  6 - 23 mg/dL Final   Creatinine, Ser 05/08/2023 1.12  0.40 - 1.50 mg/dL Final   GFR 94/71/7975 71.43  >60.00  mL/min Final   Calcium  05/08/2023 9.0  8.4 - 10.5 mg/dL Final   WBC 94/71/7975 7.9  4.0 - 10.5 K/uL Final   RBC 05/08/2023 5.90 (H)  4.22 - 5.81 Mil/uL Final   Hemoglobin 05/08/2023 14.5  13.0 - 17.0 g/dL Final   HCT 94/71/7975 45.5  39.0 - 52.0 % Final   MCV 05/08/2023 77.1 (L)  78.0 - 100.0 fl Final   MCHC 05/08/2023 31.8  30.0 - 36.0 g/dL Final   RDW 94/71/7975 18.3 (H)  11.5 - 15.5 % Final   Platelets 05/08/2023 196.0  150.0 - 400.0 K/uL Final   Neutrophils Relative % 05/08/2023 73.8  43.0 - 77.0 % Final   Lymphocytes Relative 05/08/2023 18.4  12.0 - 46.0 % Final   Monocytes Relative 05/08/2023 5.3  3.0 - 12.0 % Final   Eosinophils Relative 05/08/2023 2.0  0.0 - 5.0 % Final   Basophils Relative 05/08/2023 0.5  0.0 - 3.0 % Final   Neutro Abs 05/08/2023 5.8  1.4 - 7.7 K/uL Final   Lymphs Abs 05/08/2023 1.4  0.7 - 4.0 K/uL Final   Monocytes Absolute 05/08/2023 0.4  0.1 - 1.0 K/uL Final   Eosinophils Absolute 05/08/2023 0.2  0.0 - 0.7 K/uL Final   Basophils Absolute 05/08/2023 0.0  0.0 - 0.1 K/uL Final   Ferritin 05/08/2023 38.7  22.0 - 322.0 ng/mL Final  No results displayed because visit has over 200 results.    Office Visit on 12/26/2022  Component Date Value Ref Range Status   WBC 12/26/2022 10.1  4.0 - 10.5 K/uL Final   RBC 12/26/2022 5.71  4.22 - 5.81 Mil/uL Final   Platelets 12/26/2022 331.0  150.0 - 400.0 K/uL Final   Hemoglobin 12/26/2022 14.3  13.0 - 17.0 g/dL Final   HCT 98/83/7975 44.1  39.0 - 52.0 % Final   MCV 12/26/2022 77.2 (L)  78.0 - 100.0 fl Final   MCHC 12/26/2022 32.5  30.0 - 36.0 g/dL Final   RDW 98/83/7975 17.2 (H)  11.5 - 15.5 % Final   Sodium 12/26/2022 141  135 - 145 mEq/L Final   Potassium 12/26/2022 4.3  3.5 - 5.1 mEq/L Final   Chloride 12/26/2022 103  96 - 112 mEq/L Final   CO2 12/26/2022 29  19 - 32 mEq/L Final   Glucose, Bld 12/26/2022 103 (H)  70 - 99 mg/dL Final   BUN 98/83/7975 11  6 - 23 mg/dL Final   Creatinine, Ser 12/26/2022 1.17  0.40 -  1.50 mg/dL Final   Total Bilirubin 12/26/2022 0.5  0.2 - 1.2 mg/dL Final   Alkaline Phosphatase 12/26/2022 95  39 - 117 U/L Final   AST 12/26/2022 13  0 - 37 U/L Final   ALT 12/26/2022 12  0 - 53 U/L Final   Total Protein 12/26/2022 7.2  6.0 - 8.3 g/dL Final  Albumin 12/26/2022 4.0  3.5 - 5.2 g/dL Final   GFR 98/83/7975 67.96  >60.00 mL/min Final   Calcium  12/26/2022 9.5  8.4 - 10.5 mg/dL Final   Cholesterol 98/83/7975 139  0 - 200 mg/dL Final   Triglycerides 98/83/7975 74.0  0.0 - 149.0 mg/dL Final   HDL 98/83/7975 30.50 (L)  >60.99 mg/dL Final   VLDL 98/83/7975 14.8  0.0 - 40.0 mg/dL Final   LDL Cholesterol 12/26/2022 94  0 - 99 mg/dL Final   Total CHOL/HDL Ratio 12/26/2022 5   Final   NonHDL 12/26/2022 108.76   Final   MICRO NUMBER: 12/26/2022 [id number]   Final   SPECIMEN QUALITY: 12/26/2022 Adequate   Final   Sample Source 12/26/2022 URINE   Final   STATUS: 12/26/2022 FINAL   Final   ISOLATE 1: 12/26/2022 Klebsiella pneumoniae (A)   Final   Color, UA 12/26/2022 yellow   Final   Clarity, UA 12/26/2022 cloudy   Final   Glucose, UA 12/26/2022 Negative  Negative Final   Bilirubin, UA 12/26/2022 Negative   Final   Ketones, UA 12/26/2022 Negative   Final   Spec Grav, UA 12/26/2022 1.015  1.010 - 1.025 Final   Blood, UA 12/26/2022 Negative   Final   pH, UA 12/26/2022 5.0  5.0 - 8.0 Final   Protein, UA 12/26/2022 Negative  Negative Final   Urobilinogen, UA 12/26/2022 0.2  0.2 or 1.0 E.U./dL Final   Nitrite, UA 98/83/7975 Postive   Final   Leukocytes, UA 12/26/2022 Moderate (2+) (A)  Negative Final  No image results found. No results found.       ASSESSMENT & PLAN   Assessment & Plan Stage 3a chronic kidney disease (HCC) This is my biggest concern it is probably multifactorial cardiorenal hypertensive diabetic and possibly other factors.  Will check blood work monitor closely and he has not had an ultrasound so we will get that if this continues to progress we will get kidney  specialist involved Chronic diastolic heart failure (HCC) DOE (dyspnea on exertion) Continue with compression stockings and diuretics with as needed based navigation this is stable Diabetic nephropathy with proteinuria (HCC) Looking over his A1c he is hardly diabetic and so I doubt that the proteinuria is coming from the diabetes per se but likely multifactorial his insurance is paying for Jardiance  but not kerendia  and we will reassess proteinuria today Type 2 diabetes mellitus with other specified complication, without long-term current use of insulin  (HCC) He will continue with Mounjaro  15 mg and metformin  Diabetes is currently well controlled, based on available Hemoglobin A1c and glucose monitoring data listed in problem overview.  Diabetic education: ongoing education regarding chronic disease management for diabetes was given today. We continue to reinforce the ABC's of diabetic management: A1c (<7 or 8 dependent upon patient), tight blood pressure control, and cholesterol management with goal LDL < 100 minimally. We discuss diet strategies, exercise recommendations, medication options and possible side effects. At each visit, we review recommended immunizations and preventive care recommendations for diabetics and stress that good diabetic control can prevent other problems.  Importance of regular foot checks and yearly eye exams has been reinforced and is included here for a reminder  Diabetic Medications-Plan for today Diabetic Medications as of 12/16/2024           empagliflozin  (JARDIANCE ) 25 MG TABS tablet Take 1 tablet (25 mg total) by mouth daily before breakfast.   metFORMIN  (GLUCOPHAGE ) 1000 MG tablet TAKE 1 TABLET (1,000 MG TOTAL) BY  MOUTH TWICE A DAY WITH FOOD   tirzepatide  (MOUNJARO ) 15 MG/0.5ML Pen Inject 15 mg into the skin once a week. Replaces 12.5 mg dose      Z79.84-long term or current use of oral hypoglycemic drugs  and Z79.85-long-term or current use of injectable  non-insulin  antidiabetic drugs  Hypertension due to endocrine disorder Reviewed available data from patient and  BP Readings from Last 3 Encounters:  12/16/24 138/80  10/20/24 110/62  09/09/24 126/72   My individualized, goal average blood pressure for this patient, after considering the evidence for and against aggressive blood pressure goals as well as their past medical history and preferences, is 140/90 In my medical opinion, this problem is stable, marginally controlled  Increased medication(s) prescribed after collaborative review. Following informed consent, we adjusted the medication regimen as per documented orders to now be:  Current hypertension medications:       Sig   amLODipine -olmesartan  (AZOR ) 10-40 MG tablet Take 1 tablet by mouth daily. Replaces amlodipine  and olmesartan    bisoprolol  (ZEBETA ) 10 MG tablet Take 1 tablet (10 mg total) by mouth daily.   furosemide  (LASIX ) 40 MG tablet Take 2 tablets (80 mg total) by mouth daily. Must take with spironolactone  to keep potassium up Reduce dose to just 40 mg daily if no fluid is accumulating.   spironolactone  (ALDACTONE ) 50 MG tablet Take 1 tablet (50 mg total) by mouth daily. Replaces 25 mg dose     This looks unchanged but in actuality he was only taking half of the Azor  when he came today and he will take a full tablet going forward Vitamin D  deficiency He assures me he is taking vitamin D  once the 5000 mg capsules and insurance is trying to force the 50,000 I will prescribe the 5000 as per patient preference Coronary artery calcification Mixed hyperlipidemia Hyperlipidemia associated with type 2 diabetes mellitus (HCC) Insurance denied Repatha  but he is not at goal for his coronary artery disease with diabetes and hyperlipidemia even though he is taking max dose statin On statin therapy Intractable pain Generalized arthritis Severe joint pain secondary to his weight and he has been taking Celebrex  despite the kidney  risks we will try to get a pain specialist to help out he is already following with Ortho as well  ORDER ASSOCIATIONS  #   DIAGNOSIS / CONDITION ICD-10 ENCOUNTER ORDER     ICD-10-CM   1. Stage 3a chronic kidney disease (HCC)  N18.31 VITAMIN D  25 Hydroxy (Vit-D Deficiency, Fractures)    PTH, intact and calcium     Uric acid    US  RENAL    2. Chronic diastolic heart failure (HCC)  P49.67 Finerenone  (KERENDIA ) 20 MG TABS    empagliflozin  (JARDIANCE ) 25 MG TABS tablet    Evolocumab  (REPATHA  SURECLICK) 140 MG/ML SOAJ    3. Diabetic nephropathy with proteinuria (HCC)  E11.21 Finerenone  (KERENDIA ) 20 MG TABS    empagliflozin  (JARDIANCE ) 25 MG TABS tablet    4. Type 2 diabetes mellitus with other specified complication, without long-term current use of insulin  (HCC)  E11.69 empagliflozin  (JARDIANCE ) 25 MG TABS tablet    CBC with Differential/Platelet    Comprehensive metabolic panel with GFR    Lipid panel    Hemoglobin A1c    Microalbumin / creatinine urine ratio    Protein / creatinine ratio, urine    Magnesium    Urinalysis, Routine w reflex microscopic    Evolocumab  (REPATHA  SURECLICK) 140 MG/ML SOAJ    5. Hypertension due to endocrine  disorder  I15.2 amLODipine -olmesartan  (AZOR ) 10-40 MG tablet    6. Vitamin D  deficiency  E55.9 VITAMIN D  25 Hydroxy (Vit-D Deficiency, Fractures)    PTH, intact and calcium     Cholecalciferol (VITAMIN D3) 125 MCG (5000 UT) CAPS    7. Mixed hyperlipidemia  E78.2 Evolocumab  (REPATHA  SURECLICK) 140 MG/ML SOAJ    8. DOE (dyspnea on exertion)  R06.09 Evolocumab  (REPATHA  SURECLICK) 140 MG/ML SOAJ    9. Coronary artery calcification  I25.10 Evolocumab  (REPATHA  SURECLICK) 140 MG/ML SOAJ    10. On statin therapy  Z79.899 Evolocumab  (REPATHA  SURECLICK) 140 MG/ML SOAJ    11. Intractable pain  R52 Ambulatory referral to Pain Clinic    12. Generalized arthritis  M19.90 Ambulatory referral to Pain Clinic    13. Hyperlipidemia associated with type 2 diabetes  mellitus (HCC)  E11.69 rosuvastatin  (CRESTOR ) 40 MG tablet   E78.5          Orders Placed in Encounter:   Lab Orders         CBC with Differential/Platelet         Comprehensive metabolic panel with GFR         Lipid panel         Hemoglobin A1c         Microalbumin / creatinine urine ratio         Protein / creatinine ratio, urine         Magnesium         Urinalysis, Routine w reflex microscopic         VITAMIN D  25 Hydroxy (Vit-D Deficiency, Fractures)         PTH, intact and calcium          Uric acid     Imaging Orders         US  RENAL     Referral Orders         Ambulatory referral to Pain Clinic     Meds ordered this encounter  Medications   Finerenone  (KERENDIA ) 20 MG TABS    Sig: Take 1 tablet (20 mg total) by mouth daily. Replaces spironolactone  if approved, start at 10 mg for first 4 weeks (half tablets)    Dispense:  90 tablet    Refill:  1   empagliflozin  (JARDIANCE ) 25 MG TABS tablet    Sig: Take 1 tablet (25 mg total) by mouth daily before breakfast.    Dispense:  90 tablet    Refill:  3   amLODipine -olmesartan  (AZOR ) 10-40 MG tablet    Sig: Take 1 tablet by mouth daily. Replaces amlodipine  and olmesartan     Dispense:  90 tablet    Refill:  3   Cholecalciferol (VITAMIN D3) 125 MCG (5000 UT) CAPS    Sig: Take 1 capsule (5,000 Units total) by mouth daily.    Dispense:  90 capsule    Refill:  4   Evolocumab  (REPATHA  SURECLICK) 140 MG/ML SOAJ    Sig: Inject 140 mg into the skin every 14 (fourteen) days.    Dispense:  2 mL    Refill:  2   rosuvastatin  (CRESTOR ) 40 MG tablet    Sig: Take 1 tablet (40 mg total) by mouth daily. Replaces atorvastatin  which failed to reach Low Density Lipoprotein (LDL cholesterol) goal of 55, take only if repatha  not approved/available.    Dispense:  90 tablet    Refill:  3    Orders Placed This Encounter  Procedures   US  RENAL    Standing  Status:   Future    Expiration Date:   01/16/2025    Reason for Exam (SYMPTOM  OR  DIAGNOSIS REQUIRED):   ckd 3 with elevated psa eval for hydronephrosis    Preferred imaging location?:   [facility]   CBC with Differential/Platelet    Standing Status:   Future    Expiration Date:   12/16/2025   Comprehensive metabolic panel with GFR    [place]    Standing Status:   Future    Expiration Date:   12/16/2025    Has the patient fasted?:   No   Lipid panel    [place]    Standing Status:   Future    Expiration Date:   12/16/2025    Has the patient fasted?:   No   Hemoglobin A1c    [place]    Standing Status:   Future    Expiration Date:   12/16/2025   Microalbumin / creatinine urine ratio    Standing Status:   Future    Expiration Date:   12/16/2025   Protein / creatinine ratio, urine    Standing Status:   Future    Expiration Date:   12/16/2025   Magnesium    Standing Status:   Future    Expiration Date:   12/16/2025   Urinalysis, Routine w reflex microscopic    Standing Status:   Future    Expiration Date:   12/16/2025   VITAMIN D  25 Hydroxy (Vit-D Deficiency, Fractures)    Standing Status:   Future    Expiration Date:   12/16/2025   PTH, intact and calcium     Standing Status:   Future    Expiration Date:   12/16/2025   Uric acid    Standing Status:   Future    Expiration Date:   12/16/2025   Ambulatory referral to Pain Clinic    Referral Priority:   Routine    Referral Reason:   Specialty Services Required    Number of Visits Requested:   1    On the day of the visit, I dedicated 47 minutes to both direct and indirect patient care activities.  The time was spent: History: I obtained, documented, and reviewed a thorough medical history. I reviewed the patient's reported symptoms and clarified their context and significance in relation to the current visit. Examination: I conducted a medically appropriate physical evaluation. Data Synthesis: I synthesized information for clinical decision-making. Communication: I communicated clinical status and plan to the patient  and/or family/caregiver. Counseling & Education: I provided personalized counseling on condition and treatment. Documentation: Documenting clinical findings and medical decision-making, and creating and providing documentation for patient review. Treatment Plan: I worked collaboratively with the patient to formulate and communicate an individualized plan (including shared decision-making). Orders: I placed necessary orders (medications, labs, imaging, referrals) in the EMR. Referrals and Communication: I referred the patient to other health care professionals as needed and communicated with them to ensure coordinated care.  This time was spent independently of any separately billable procedures. Please note that this statement is intended to provide a clear and comprehensive account of the time and services provided during the patient's visit.  The extended time spent was necessary to provide safe, effective, and comprehensive care due to the following factors:, Extensive Comorbidities: The patient's multiple chronic conditions necessitated careful coordination, monitoring, and integration of care plans., Data Analysis & Complex Decision-Making: I performed in-depth data review and complex treatment planning tailored to the patient's  unique clinical profile., and Intensive Medication Monitoring: Close review and monitoring of high-risk drug therapy were required to prevent adverse events and ensure therapeutic effectiveness.      This document was synthesized by artificial intelligence (Abridge) using HIPAA-compliant recording of the clinical interaction;   We discussed the use of AI scribe software for clinical note transcription with the patient, who gave verbal consent to proceed. additional Info: This encounter employed state-of-the-art, real-time, collaborative documentation. The patient actively reviewed and assisted in updating their electronic medical record on a shared screen, ensuring  transparency and facilitating joint problem-solving for the problem list, overview, and plan. This approach promotes accurate, informed care. The treatment plan was discussed and reviewed in detail, including medication safety, potential side effects, and all patient questions. We confirmed understanding and comfort with the plan. Follow-up instructions were established, including contacting the office for any concerns, returning if symptoms worsen, persist, or new symptoms develop, and precautions for potential emergency department visits.

## 2024-12-16 NOTE — Patient Instructions (Addendum)
 It was a pleasure seeing you today! Your health and satisfaction are our top priorities.  Jason Cone, MD  Restart your full dose of your Azor  now we are getting an ultrasound to check at your kidneys and checking a lot of blood work and sending you to a pain specialist for your joint pain and were hoping that you can come off of the Celebrex  because of our concern for your kidneys.  Please monitor your blood pressures with these changes     Your Providers PCP: Meyer Jason MATSU, MD,  [phone]) Referring Provider: Cone Jason MATSU, MD,  [phone]) Care Team Provider: Debera Jayson MATSU, MD,  [phone]) Care Team Provider: Margrette Taft BRAVO, MD,  [phone])  NEXT STEPS: [x]  Early Intervention: Schedule sooner appointment, call our on-call services, or go to emergency room if there is any significant Increase in pain or discomfort New or worsening symptoms Sudden or severe changes in your health [x]  Flexible Follow-Up: We recommend a Return in about 3 months (around 03/16/2025). for optimal routine care. This allows for progress monitoring and treatment adjustments. [x]  Preventive Care: Schedule your annual preventive care visit! It's typically covered by insurance and helps identify potential health issues early. [x]  Lab & X-ray Appointments: Incomplete tests scheduled today, or call to schedule. X-rays: [facility] (M-F, 8:30am-noon or 1pm-5pm). [x]  Medical Information Release: Sign a release form at front desk to obtain relevant medical information we don't have.  MAKING THE MOST OF OUR FOCUSED 20 MINUTE APPOINTMENTS: [x]   Clearly state your top concerns at the beginning of the visit to focus our discussion [x]   If you anticipate you will need more time, please inform the front desk during scheduling - we can book multiple appointments in the same week. [x]   If you have transportation problems- use our convenient video appointments or ask about  transportation support. [x]   We can get down to business faster if you use MyChart to update information before the visit and submit non-urgent questions before your visit. Thank you for taking the time to provide details through MyChart.  Let our nurse know and she can import this information into your encounter documents.  Arrival and Wait Times: [x]   Arriving on time ensures that everyone receives prompt attention. [x]   Early morning (8a) and afternoon (1p) appointments tend to have shortest wait times. [x]   Unfortunately, we cannot delay appointments for late arrivals or hold slots during phone calls.  Getting Answers and Following Up [x]   Simple Questions & Concerns: For quick questions or basic follow-up after your visit, reach us  at [phone] or MyChart messaging. [x]   Complex Concerns: If your concern is more complex, scheduling an appointment might be best. Discuss this with the staff to find the most suitable option. [x]   Lab & Imaging Results: We'll contact you directly if results are abnormal or you don't use MyChart. Most normal results will be on MyChart within 2-3 business days, with a review message from Dr. Cone. Haven't heard back in 2 weeks? Need results sooner? Contact us  at [phone]. [x]   Referrals: Our referral coordinator will manage specialist referrals. The specialist's office should contact you within 2 weeks to schedule an appointment. Call us  if you haven't heard from them after 2 weeks.  Staying Connected [x]   MyChart: Activate your MyChart for the fastest way to access results and message us . See the last page of this paperwork for instructions on how to activate.  Bring to Your Next Appointment [x]   Medications: Please bring all your medication bottles to your next appointment to ensure we have an accurate record of your prescriptions. [x]   Health Diaries: If you're monitoring any health conditions at home, keeping a diary of your readings can be very  helpful for discussions at your next appointment.  Billing [x]   X-ray & Lab Orders: These are billed by separate companies. Contact the invoicing company directly for questions or concerns. [x]   Visit Charges: Discuss any billing inquiries with our administrative services team.  Your Satisfaction Matters [x]   Share Your Experience: We strive for your satisfaction! If you have any complaints, or preferably compliments, please let Dr. Jesus know directly or contact our Practice Administrators, Manuelita Rubin or [employer], by asking at the front desk.   Reviewing Your Records [x]   Review this early draft of your clinical encounter notes below and the final encounter summary tomorrow on MyChart after its been completed.  All orders placed so far are visible here: Stage 3a chronic kidney disease (HCC) -     VITAMIN D  25 Hydroxy (Vit-D Deficiency, Fractures); Future -     PTH, intact and calcium ; Future -     Uric acid; Future -     US  RENAL; Future  Chronic diastolic heart failure (HCC) -     Kerendia ; Take 1 tablet (20 mg total) by mouth daily. Replaces spironolactone  if approved, start at 10 mg for first 4 weeks (half tablets)  Dispense: 90 tablet; Refill: 1 -     Empagliflozin ; Take 1 tablet (25 mg total) by mouth daily before breakfast.  Dispense: 90 tablet; Refill: 3 -     Repatha  SureClick; Inject 140 mg into the skin every 14 (fourteen) days.  Dispense: 2 mL; Refill: 2  Diabetic nephropathy with proteinuria (HCC) -     Kerendia ; Take 1 tablet (20 mg total) by mouth daily. Replaces spironolactone  if approved, start at 10 mg for first 4 weeks (half tablets)  Dispense: 90 tablet; Refill: 1 -     Empagliflozin ; Take 1 tablet (25 mg total) by mouth daily before breakfast.  Dispense: 90 tablet; Refill: 3  Type 2 diabetes mellitus with other specified complication, without long-term current use of insulin  (HCC) -     Empagliflozin ; Take 1 tablet (25 mg total) by mouth daily before  breakfast.  Dispense: 90 tablet; Refill: 3 -     CBC with Differential/Platelet; Future -     Comprehensive metabolic panel with GFR; Future -     Lipid panel; Future -     Hemoglobin A1c; Future -     Microalbumin / creatinine urine ratio; Future -     Protein / creatinine ratio, urine; Future -     Magnesium; Future -     Urinalysis, Routine w reflex microscopic; Future -     Repatha  SureClick; Inject 140 mg into the skin every 14 (fourteen) days.  Dispense: 2 mL; Refill: 2  Hypertension due to endocrine disorder -     amLODIPine -Olmesartan ; Take 1 tablet by mouth daily. Replaces amlodipine  and olmesartan   Dispense: 90 tablet; Refill: 3  Vitamin D  deficiency -     VITAMIN D  25 Hydroxy (Vit-D Deficiency, Fractures); Future -     PTH, intact and calcium ; Future -     Vitamin D3; Take 1 capsule (5,000 Units total) by mouth daily.  Dispense: 90 capsule; Refill: 4  Mixed hyperlipidemia -     Repatha  SureClick; Inject 140 mg into the skin every 14 (fourteen) days.  Dispense: 2 mL; Refill: 2  DOE (dyspnea on exertion) -     Repatha  SureClick; Inject 140 mg into the skin every 14 (fourteen) days.  Dispense: 2 mL; Refill: 2  Coronary artery calcification -     Repatha  SureClick; Inject 140 mg into the skin every 14 (fourteen) days.  Dispense: 2 mL; Refill: 2  On statin therapy -     Repatha  SureClick; Inject 140 mg into the skin every 14 (fourteen) days.  Dispense: 2 mL; Refill: 2  Intractable pain -     Ambulatory referral to Pain Clinic  Generalized arthritis -     Ambulatory referral to Pain Clinic  Hyperlipidemia associated with type 2 diabetes mellitus (HCC) -     Rosuvastatin  Calcium ; Take 1 tablet (40 mg total) by mouth daily. Replaces atorvastatin  which failed to reach Low Density Lipoprotein (LDL cholesterol) goal of 55, take only if repatha  not approved/available.  Dispense: 90 tablet; Refill: 3

## 2024-12-17 ENCOUNTER — Other Ambulatory Visit (HOSPITAL_COMMUNITY): Payer: Self-pay

## 2024-12-17 ENCOUNTER — Telehealth: Payer: Self-pay

## 2024-12-17 NOTE — Telephone Encounter (Signed)
 Pharmacy Patient Advocate Encounter  Received notification from [facility] that Prior Authorization for  Repatha  SureClick 140MG /ML auto-injectors  has been APPROVED from 12/17/24 to 12/17/25. Ran test claim, Copay is $75.00. This test claim was processed through [facility]- copay amounts may vary at other pharmacies due to pharmacy/plan contracts, or as the patient moves through the different stages of their insurance plan.   PA #/Case ID/Reference #: [id number]

## 2024-12-17 NOTE — Telephone Encounter (Signed)
 Pharmacy Patient Advocate Encounter   Received notification from [employer] that prior authorization for Repatha  SureClick 140MG /ML auto-injectors is required/requested.   Insurance verification completed.   The patient is insured through [facility].   Per test claim: PA required; PA submitted to above mentioned insurance via Latent Key/confirmation #/EOC [id number] Status is pending

## 2024-12-17 NOTE — Telephone Encounter (Signed)
 Pharmacy Patient Advocate Encounter   Received notification from [facility] KEY that prior authorization for Kerendia  20MG  tablets is required/requested.   Insurance verification completed.   The patient is insured through [facility].   Per test claim: PA required; PA submitted to above mentioned insurance via Latent Key/confirmation #/EOC [id number] Status is pending

## 2024-12-17 NOTE — Telephone Encounter (Signed)
 Pharmacy Patient Advocate Encounter  Received notification from [facility] that Prior Authorization for Kerendia  20MG  tablets  has been APPROVED from 12/17/24 to 12/17/25. Ran test claim, Copay is $0.00. This test claim was processed through [facility]- copay amounts may vary at other pharmacies due to pharmacy/plan contracts, or as the patient moves through the different stages of their insurance plan.   PA #/Case ID/Reference #: [id number]

## 2024-12-24 ENCOUNTER — Ambulatory Visit (INDEPENDENT_AMBULATORY_CARE_PROVIDER_SITE_OTHER): Admitting: Surgical

## 2024-12-24 ENCOUNTER — Encounter: Payer: Self-pay | Admitting: Surgical

## 2024-12-24 DIAGNOSIS — M25561 Pain in right knee: Secondary | ICD-10-CM

## 2024-12-24 DIAGNOSIS — M25562 Pain in left knee: Secondary | ICD-10-CM

## 2024-12-24 DIAGNOSIS — M170 Bilateral primary osteoarthritis of knee: Secondary | ICD-10-CM | POA: Diagnosis not present

## 2024-12-24 DIAGNOSIS — G8929 Other chronic pain: Secondary | ICD-10-CM

## 2024-12-24 MED ORDER — BUPIVACAINE HCL 0.25 % IJ SOLN
4.0000 mL | INTRAMUSCULAR | Status: AC | PRN
  Administered 2024-12-24: 4 mL via INTRA_ARTICULAR

## 2024-12-24 MED ORDER — LIDOCAINE HCL 1 % IJ SOLN
5.0000 mL | INTRAMUSCULAR | Status: AC | PRN
  Administered 2024-12-24: 5 mL

## 2024-12-24 MED ORDER — TRIAMCINOLONE ACETONIDE 40 MG/ML IJ SUSP
40.0000 mg | INTRAMUSCULAR | Status: AC | PRN
  Administered 2024-12-24: 40 mg via INTRA_ARTICULAR

## 2024-12-24 NOTE — Progress Notes (Signed)
" ° °  Procedure Note  Patient: Jason Meyer             Date of Birth: 03-03-1961           MRN: [id number]             Visit Date: 12/24/2024  Procedures: Visit Diagnoses:  1. Bilateral primary osteoarthritis of knee   2. Bilateral chronic knee pain     Large Joint Inj: bilateral knee on 12/24/2024 12:58 PM Indications: diagnostic evaluation, joint swelling and pain Details: 18 G 1.5 in needle, superolateral approach  Arthrogram: No  Medications (Right): 5 mL lidocaine  1 %; 4 mL bupivacaine  0.25 %; 40 mg triamcinolone  acetonide 40 MG/ML Medications (Left): 5 mL lidocaine  1 %; 4 mL bupivacaine  0.25 %; 40 mg triamcinolone  acetonide 40 MG/ML Outcome: tolerated well, no immediate complications Procedure, treatment alternatives, risks and benefits explained, specific risks discussed. Consent was given by the patient. Immediately prior to procedure a time out was called to verify the correct patient, procedure, equipment, support staff and site/side marked as required. Patient was prepped and draped in the usual sterile fashion.        "

## 2025-01-01 ENCOUNTER — Ambulatory Visit
Admission: RE | Admit: 2025-01-01 | Discharge: 2025-01-01 | Disposition: A | Discharge status: Home / Self Care | Source: Ambulatory Visit | Attending: Internal Medicine | Admitting: Internal Medicine

## 2025-01-01 DIAGNOSIS — N1831 Chronic kidney disease, stage 3a: Secondary | ICD-10-CM

## 2025-01-05 ENCOUNTER — Ambulatory Visit: Payer: Self-pay | Admitting: Internal Medicine

## 2025-01-06 ENCOUNTER — Encounter: Payer: Self-pay | Admitting: Internal Medicine

## 2025-01-06 ENCOUNTER — Other Ambulatory Visit (HOSPITAL_COMMUNITY): Payer: Self-pay | Admitting: Urology

## 2025-01-06 DIAGNOSIS — R972 Elevated prostate specific antigen [PSA]: Secondary | ICD-10-CM

## 2025-01-19 ENCOUNTER — Ambulatory Visit (HOSPITAL_COMMUNITY)

## 2025-01-20 ENCOUNTER — Ambulatory Visit: Admitting: Internal Medicine

## 2025-03-16 ENCOUNTER — Ambulatory Visit: Admitting: Internal Medicine

## 2025-03-25 ENCOUNTER — Ambulatory Visit: Admitting: Surgical
